# Patient Record
Sex: Female | Born: 1950
Health system: Southern US, Community
[De-identification: ages and names within clinical notes are randomized; demographics above are authoritative.]

## PROBLEM LIST (undated history)

## (undated) DIAGNOSIS — G709 Myoneural disorder, unspecified: Secondary | ICD-10-CM

## (undated) DIAGNOSIS — T4145XA Adverse effect of unspecified anesthetic, initial encounter: Secondary | ICD-10-CM

## (undated) DIAGNOSIS — E782 Mixed hyperlipidemia: Secondary | ICD-10-CM

## (undated) DIAGNOSIS — G4730 Sleep apnea, unspecified: Secondary | ICD-10-CM

## (undated) DIAGNOSIS — M810 Age-related osteoporosis without current pathological fracture: Secondary | ICD-10-CM

## (undated) DIAGNOSIS — T8859XA Other complications of anesthesia, initial encounter: Secondary | ICD-10-CM

## (undated) DIAGNOSIS — D649 Anemia, unspecified: Secondary | ICD-10-CM

## (undated) DIAGNOSIS — R05 Cough: Secondary | ICD-10-CM

## (undated) DIAGNOSIS — R351 Nocturia: Secondary | ICD-10-CM

## (undated) DIAGNOSIS — K219 Gastro-esophageal reflux disease without esophagitis: Secondary | ICD-10-CM

## (undated) DIAGNOSIS — R350 Frequency of micturition: Secondary | ICD-10-CM

## (undated) DIAGNOSIS — J309 Allergic rhinitis, unspecified: Secondary | ICD-10-CM

## (undated) DIAGNOSIS — Z87898 Personal history of other specified conditions: Secondary | ICD-10-CM

## (undated) DIAGNOSIS — Z9889 Other specified postprocedural states: Secondary | ICD-10-CM

## (undated) DIAGNOSIS — R059 Cough, unspecified: Secondary | ICD-10-CM

## (undated) DIAGNOSIS — R51 Headache: Secondary | ICD-10-CM

## (undated) DIAGNOSIS — Z8719 Personal history of other diseases of the digestive system: Secondary | ICD-10-CM

## (undated) DIAGNOSIS — N393 Stress incontinence (female) (male): Secondary | ICD-10-CM

## (undated) DIAGNOSIS — M8580 Other specified disorders of bone density and structure, unspecified site: Secondary | ICD-10-CM

## (undated) DIAGNOSIS — R3915 Urgency of urination: Secondary | ICD-10-CM

## (undated) DIAGNOSIS — M1990 Unspecified osteoarthritis, unspecified site: Secondary | ICD-10-CM

## (undated) DIAGNOSIS — R112 Nausea with vomiting, unspecified: Secondary | ICD-10-CM

## (undated) DIAGNOSIS — T7840XA Allergy, unspecified, initial encounter: Secondary | ICD-10-CM

## (undated) DIAGNOSIS — Z8669 Personal history of other diseases of the nervous system and sense organs: Secondary | ICD-10-CM

## (undated) DIAGNOSIS — Z9989 Dependence on other enabling machines and devices: Secondary | ICD-10-CM

## (undated) DIAGNOSIS — G4733 Obstructive sleep apnea (adult) (pediatric): Secondary | ICD-10-CM

## (undated) DIAGNOSIS — H269 Unspecified cataract: Secondary | ICD-10-CM

## (undated) DIAGNOSIS — J00 Acute nasopharyngitis [common cold]: Secondary | ICD-10-CM

## (undated) DIAGNOSIS — R0602 Shortness of breath: Secondary | ICD-10-CM

## (undated) HISTORY — DX: Unspecified cataract: H26.9

## (undated) HISTORY — DX: Mixed hyperlipidemia: E78.2

## (undated) HISTORY — DX: Allergic rhinitis, unspecified: J30.9

## (undated) HISTORY — DX: Personal history of other specified conditions: Z87.898

## (undated) HISTORY — PX: COLONOSCOPY: SHX174

## (undated) HISTORY — DX: Other specified disorders of bone density and structure, unspecified site: M85.80

## (undated) HISTORY — DX: Anemia, unspecified: D64.9

## (undated) HISTORY — DX: Personal history of other diseases of the nervous system and sense organs: Z86.69

## (undated) HISTORY — DX: Sleep apnea, unspecified: G47.30

## (undated) HISTORY — DX: Myoneural disorder, unspecified: G70.9

## (undated) HISTORY — DX: Age-related osteoporosis without current pathological fracture: M81.0

## (undated) HISTORY — DX: Allergy, unspecified, initial encounter: T78.40XA

---

## 1986-01-22 HISTORY — PX: VAGINAL HYSTERECTOMY: SUR661

## 1993-01-22 HISTORY — PX: SALPINGOOPHORECTOMY: SHX82

## 1999-01-11 ENCOUNTER — Encounter: Payer: Self-pay | Admitting: Obstetrics and Gynecology

## 1999-01-11 ENCOUNTER — Encounter: Admission: RE | Admit: 1999-01-11 | Discharge: 1999-01-11 | Payer: Self-pay | Admitting: Obstetrics and Gynecology

## 1999-01-18 ENCOUNTER — Other Ambulatory Visit: Admission: RE | Admit: 1999-01-18 | Discharge: 1999-01-18 | Payer: Self-pay | Admitting: Obstetrics and Gynecology

## 1999-06-29 ENCOUNTER — Emergency Department (HOSPITAL_COMMUNITY): Admission: EM | Admit: 1999-06-29 | Discharge: 1999-06-29 | Payer: Self-pay | Admitting: Emergency Medicine

## 2000-01-12 ENCOUNTER — Encounter: Admission: RE | Admit: 2000-01-12 | Discharge: 2000-01-12 | Payer: Self-pay | Admitting: Obstetrics and Gynecology

## 2000-01-12 ENCOUNTER — Encounter: Payer: Self-pay | Admitting: Obstetrics and Gynecology

## 2000-01-22 ENCOUNTER — Other Ambulatory Visit: Admission: RE | Admit: 2000-01-22 | Discharge: 2000-01-22 | Payer: Self-pay | Admitting: Obstetrics and Gynecology

## 2001-01-13 ENCOUNTER — Encounter: Payer: Self-pay | Admitting: Obstetrics and Gynecology

## 2001-01-13 ENCOUNTER — Encounter: Admission: RE | Admit: 2001-01-13 | Discharge: 2001-01-13 | Payer: Self-pay | Admitting: Obstetrics and Gynecology

## 2001-02-18 ENCOUNTER — Other Ambulatory Visit: Admission: RE | Admit: 2001-02-18 | Discharge: 2001-02-18 | Payer: Self-pay | Admitting: Obstetrics and Gynecology

## 2002-02-06 ENCOUNTER — Encounter: Admission: RE | Admit: 2002-02-06 | Discharge: 2002-02-06 | Payer: Self-pay | Admitting: Obstetrics and Gynecology

## 2002-02-06 ENCOUNTER — Encounter: Payer: Self-pay | Admitting: Obstetrics and Gynecology

## 2003-01-25 ENCOUNTER — Encounter: Payer: Self-pay | Admitting: Internal Medicine

## 2003-01-25 LAB — CONVERTED CEMR LAB

## 2003-06-10 ENCOUNTER — Encounter: Admission: RE | Admit: 2003-06-10 | Discharge: 2003-06-10 | Payer: Self-pay | Admitting: Internal Medicine

## 2004-05-11 ENCOUNTER — Ambulatory Visit: Payer: Self-pay | Admitting: Endocrinology

## 2004-06-28 ENCOUNTER — Ambulatory Visit: Payer: Self-pay | Admitting: Internal Medicine

## 2004-07-03 ENCOUNTER — Ambulatory Visit: Payer: Self-pay | Admitting: Internal Medicine

## 2004-07-21 ENCOUNTER — Encounter: Admission: RE | Admit: 2004-07-21 | Discharge: 2004-07-21 | Payer: Self-pay | Admitting: Internal Medicine

## 2005-03-09 ENCOUNTER — Ambulatory Visit: Payer: Self-pay | Admitting: Internal Medicine

## 2005-10-12 ENCOUNTER — Ambulatory Visit: Payer: Self-pay | Admitting: Internal Medicine

## 2005-10-16 ENCOUNTER — Ambulatory Visit: Payer: Self-pay | Admitting: Internal Medicine

## 2005-10-31 ENCOUNTER — Encounter: Admission: RE | Admit: 2005-10-31 | Discharge: 2005-10-31 | Payer: Self-pay | Admitting: Internal Medicine

## 2006-12-03 ENCOUNTER — Encounter: Payer: Self-pay | Admitting: Internal Medicine

## 2006-12-03 DIAGNOSIS — Z87898 Personal history of other specified conditions: Secondary | ICD-10-CM

## 2006-12-03 DIAGNOSIS — E785 Hyperlipidemia, unspecified: Secondary | ICD-10-CM | POA: Insufficient documentation

## 2006-12-03 DIAGNOSIS — E782 Mixed hyperlipidemia: Secondary | ICD-10-CM

## 2006-12-03 DIAGNOSIS — Z8669 Personal history of other diseases of the nervous system and sense organs: Secondary | ICD-10-CM

## 2006-12-03 HISTORY — DX: Personal history of other specified conditions: Z87.898

## 2006-12-03 HISTORY — DX: Mixed hyperlipidemia: E78.2

## 2006-12-03 HISTORY — DX: Personal history of other diseases of the nervous system and sense organs: Z86.69

## 2007-01-30 ENCOUNTER — Ambulatory Visit: Payer: Self-pay | Admitting: Internal Medicine

## 2007-01-30 LAB — CONVERTED CEMR LAB
ALT: 36 units/L — ABNORMAL HIGH (ref 0–35)
Albumin: 4 g/dL (ref 3.5–5.2)
Alkaline Phosphatase: 118 units/L — ABNORMAL HIGH (ref 39–117)
BUN: 13 mg/dL (ref 6–23)
Bilirubin, Direct: 0.1 mg/dL (ref 0.0–0.3)
CO2: 28 meq/L (ref 19–32)
Chloride: 107 meq/L (ref 96–112)
Creatinine, Ser: 1 mg/dL (ref 0.4–1.2)
Direct LDL: 139.3 mg/dL
Eosinophils Relative: 8.9 % — ABNORMAL HIGH (ref 0.0–5.0)
GFR calc Af Amer: 73 mL/min
GFR calc non Af Amer: 61 mL/min
HCT: 38.7 % (ref 36.0–46.0)
Hemoglobin: 13 g/dL (ref 12.0–15.0)
LDL Cholesterol: 138 mg/dL — ABNORMAL HIGH (ref 0–99)
MCV: 84.9 fL (ref 78.0–100.0)
Monocytes Absolute: 0.5 10*3/uL (ref 0.2–0.7)
Platelets: 442 10*3/uL — ABNORMAL HIGH (ref 150–400)
Potassium: 3.9 meq/L (ref 3.5–5.1)
RBC: 4.56 M/uL (ref 3.87–5.11)
Sodium: 142 meq/L (ref 135–145)
Total Protein: 7.5 g/dL (ref 6.0–8.3)
VLDL: 23 mg/dL (ref 0–40)

## 2007-02-02 ENCOUNTER — Encounter: Payer: Self-pay | Admitting: Internal Medicine

## 2007-03-14 ENCOUNTER — Encounter: Admission: RE | Admit: 2007-03-14 | Discharge: 2007-03-14 | Payer: Self-pay | Admitting: Internal Medicine

## 2007-05-20 ENCOUNTER — Ambulatory Visit: Payer: Self-pay | Admitting: Internal Medicine

## 2007-05-20 DIAGNOSIS — J069 Acute upper respiratory infection, unspecified: Secondary | ICD-10-CM | POA: Insufficient documentation

## 2008-01-12 ENCOUNTER — Telehealth: Payer: Self-pay | Admitting: Internal Medicine

## 2008-01-12 ENCOUNTER — Ambulatory Visit: Payer: Self-pay | Admitting: Internal Medicine

## 2008-06-22 ENCOUNTER — Encounter: Admission: RE | Admit: 2008-06-22 | Discharge: 2008-06-22 | Payer: Self-pay | Admitting: Internal Medicine

## 2009-03-31 ENCOUNTER — Ambulatory Visit: Payer: Self-pay | Admitting: Internal Medicine

## 2009-04-18 ENCOUNTER — Ambulatory Visit: Payer: Self-pay | Admitting: Internal Medicine

## 2009-12-22 ENCOUNTER — Encounter: Admission: RE | Admit: 2009-12-22 | Discharge: 2009-12-22 | Payer: Self-pay | Admitting: Internal Medicine

## 2010-02-23 NOTE — Assessment & Plan Note (Signed)
Summary: 2 wk f/u from seeing Christina Hayes(men was out)/cd   Vital Signs:  Patient profile:   60 year old female Height:      62 inches Weight:      173 pounds BMI:     31.76 O2 Sat:      97 % on Room air Temp:     98.4 degrees F oral Pulse rate:   72 / minute BP sitting:   118 / 74  (left arm) Cuff size:   regular  Vitals Entered By: Bill Salinas CMA (April 18, 2009 1:49 PM)  O2 Flow:  Room air CC: pt here for follow up after seeing Dr Yetta Barre last week, with cough, and cold symptoms, pt states she was given a z-pak which seemed to help but pt still has symptoms/ ab   Primary Care Provider:  Loanne Emery  CC:  pt here for follow up after seeing Dr Yetta Barre last week, with cough, and cold symptoms, and pt states she was given a z-pak which seemed to help but pt still has symptoms/ ab.  History of Present Illness: Seen march 10th by Dr. Yetta Barre - office note reviewed - diagnosed with acute bronchitis, treated with azithromycin 500mg  once daily x 3 and tussionex q 12. It did make her drowsy during the day. She continues to cough and she has had some itchy eyes and sneezing. She has a recurrent cough that is non-productive. She has had more trouble with seasonal allergies  over the past several years. No shortness of breath.   Current Medications (verified): 1)  Simvastatin 40 Mg  Tabs (Simvastatin) .... Once Daily 2)  Topamax 100 Mg  Tabs (Topiramate) .... Take 1  1/2 Tablet By Mouth Once A Day 3)  Meclizine Hcl 25 Mg  Tabs (Meclizine Hcl) .... Take 1 Tablet By Mouth Three Times A Day As Needed 4)  Melatonin 300 Mcg Tabs (Melatonin) .... Take 2 Tablet By Mouth Once A Day 5)  Prevacid 15 Mg Cpdr (Lansoprazole) .... As Needed 6)  Methocarbamol 500 Mg Tabs (Methocarbamol) 7)  Diclofenac Sodium  Powd (Diclofenac Sodium) 8)  Tussionex Pennkinetic Er 8-10 Mg/107ml Lqcr (Chlorpheniramine-Hydrocodone) .... 5 Ml By Mouth Two Times A Day As Needed For Cough  Allergies (verified): No Known Drug Allergies  Past  History:  Past Medical History: Last updated: 01/12/2008 VERTIGO, HX OF (ICD-V12.49) MIGRAINES, HX OF (ICD-V13.8) HYPERLIPIDEMIA, MIXED (ICD-272.2)      Physician Roster                   Headache -   Dr. Lorelee Market - none  Past Surgical History: Last updated: 01/30/2007 Hysterectomy BSO  G2P1  Family History: Last updated: 01/30/2007 father - '29; alzheimers, DM, lipids mother- '32; good health, colon cancer survivor, DM Neg- CAD,  MGM Breast cancer  Social History: Last updated: 03/31/2009 HSG married 22 -divorced, remarried '08 1 daughter'79 work: retired from Production designer, theatre/television/film for [employer] . Never Smoked Alcohol use-no Drug use-no Regular exercise-no  Risk Factors: Alcohol Use: <1 (03/31/2009) >5 drinks/d w/in last 3 months: no (03/31/2009) Exercise: no (03/31/2009)  Risk Factors: Smoking Status: never (03/31/2009)  Review of Systems  The patient denies anorexia, fever, weight loss, weight gain, decreased hearing, chest pain, syncope, dyspnea on exertion, headaches, abdominal pain, hematochezia, hematuria, muscle weakness, difficulty walking, unusual weight change, and angioedema.    Physical Exam  General:  alert, well-developed,  well-nourished, and well-hydrated.   Head:  normocephalic and atraumatic.   Eyes:  vision grossly intact, pupils equal, and pupils round.   Neck:  supple, full ROM, and no thyromegaly.   Lungs:  normal respiratory effort, normal breath sounds, no crackles, and no wheezes.   Heart:  normal rate, regular rhythm, no JVD, and no HJR.   Skin:  turgor normal, color normal, and no rashes.   Cervical Nodes:  no anterior cervical adenopathy and no posterior cervical adenopathy.   Psych:  Oriented X3, memory intact for recent and remote, normally interactive, and good eye contact.     Impression & Recommendations:  Problem # 1:  COUGH (ICD-786.2) No evidence of infection. Most likely cyclical cough.  Plan -  prednisone 20mg  once daily x 7, prom/cod 1 tsp q 6, tessalon perles three times a day.  Complete Medication List: 1)  Simvastatin 40 Mg Tabs (Simvastatin) .... Once daily 2)  Topamax 100 Mg Tabs (Topiramate) .... Take 1  1/2 tablet by mouth once a day 3)  Meclizine Hcl 25 Mg Tabs (Meclizine hcl) .... Take 1 tablet by mouth three times a day as needed 4)  Melatonin 300 Mcg Tabs (Melatonin) .... Take 2 tablet by mouth once a day 5)  Prevacid 15 Mg Cpdr (Lansoprazole) .... As needed 6)  Methocarbamol 500 Mg Tabs (Methocarbamol) 7)  Diclofenac Sodium Powd (Diclofenac sodium) 8)  Promethazine-codeine 6.25-10 Mg/40ml Syrp (Promethazine-codeine) .Marland Kitchen.. 1 tsp q 6 as needed for cough 9)  Prednisone 20 Mg Tabs (Prednisone) .Marland Kitchen.. 1 by mouth once daily 10)  Benzonatate 100 Mg Caps (Benzonatate) .Marland Kitchen.. 1 by mouth three times a day  Patient Instructions: 1)  cough - sounds like a cyclical cough: cough-irritation of trachea-cough-irritation of trachea, etc. Plan - prednisone 20mg  once daily x 7, tessalon perles 100mg  three times a day x 7 days, promethazine with codeine 1 tsp every 6 hours as needed. 2)  Allergic rhinnitis - fine to take loratadine or other otc non-sedating antihistamine 3)  Shingles vaccine - ok for today.  Prescriptions: BENZONATATE 100 MG CAPS (BENZONATATE) 1 by mouth three times a day  #30 x 1   Entered and Authorized by:   Jacques Navy MD   Signed by:   Jacques Navy MD on 04/18/2009   Method used:   Print then Give to Patient   RxID:   [id number] PREDNISONE 20 MG TABS (PREDNISONE) 1 by mouth once daily  #7 x 0   Entered and Authorized by:   Jacques Navy MD   Signed by:   Jacques Navy MD on 04/18/2009   Method used:   Print then Give to Patient   RxID:   [id number] PROMETHAZINE-CODEINE 6.25-10 MG/5ML SYRP (PROMETHAZINE-CODEINE) 1 tsp q 6 as needed for cough  #8 oz x 1   Entered and Authorized by:   Jacques Navy MD   Signed by:   Jacques Navy MD on  04/18/2009   Method used:   Print then Give to Patient   RxID:   [id number]    Preventive Care Screening  Mammogram:    Date:  06/22/2008    Results:  normal     Not Administered:    Influenza Vaccine not given due to: declined

## 2010-02-23 NOTE — Assessment & Plan Note (Signed)
Summary: COUGH,COLD/MEN PT/NO FEVER/CD   Vital Signs:  Patient profile:   60 year old female Height:      62 inches Weight:      175 pounds BMI:     32.12 O2 Sat:      98 % on Room air Temp:     98.0 degrees F oral Pulse rate:   69 / minute Pulse rhythm:   regular Resp:     16 per minute BP sitting:   118 / 70  (left arm) Cuff size:   large  Vitals Entered By: Rock Nephew CMA (March 31, 2009 2:34 PM)  Nutrition Counseling: Patient's BMI is greater than 25 and therefore counseled on weight management options.  O2 Flow:  Room air CC: cough, congestion x 1wk, URI symptoms Is Patient Diabetic? No Pain Assessment Patient in pain? no        Primary Care Provider:  Norins  CC:  cough, congestion x 1wk, and URI symptoms.  History of Present Illness:  URI Symptoms      This is a 60 year old woman who presents with URI symptoms.  The symptoms began 1 week ago.  The severity is described as mild.  The patient reports nasal congestion, purulent nasal discharge, and dry cough, but denies sore throat, productive cough, earache, and sick contacts.  The patient denies fever, stiff neck, dyspnea, wheezing, rash, vomiting, diarrhea, use of an antipyretic, and response to antipyretic.  The patient denies sneezing, seasonal symptoms, headache, muscle aches, and severe fatigue.  Risk factors for Strep sinusitis include double sickening.  The patient denies the following risk factors for Strep sinusitis: unilateral facial pain, unilateral nasal discharge, poor response to decongestant, tooth pain, Strep exposure, tender adenopathy, and absence of cough.    Preventive Screening-Counseling & Management  Alcohol-Tobacco     Alcohol drinks/day: <1     Alcohol type: wine     >5/day in last 3 mos: no     Alcohol Counseling: not indicated; use of alcohol is not excessive or problematic     Feels need to cut down: no     Feels annoyed by complaints: no     Feels guilty re: drinking: no  Needs 'eye opener' in am: no     Smoking Status: never  Caffeine-Diet-Exercise     Does Patient Exercise: no  Hep-HIV-STD-Contraception     Hepatitis Risk: no risk noted     HIV Risk: no risk noted     STD Risk: no risk noted      Sexual History:  currently monogamous.        Drug Use:  no.        Blood Transfusions:  no.    Medications Prior to Update: 1)  Simvastatin 40 Mg  Tabs (Simvastatin) .... Once Daily 2)  Estradiol 0.05 Mg/24hr Ptwk (Estradiol) .... Apply 1 Q Wk 3)  Topamax 100 Mg  Tabs (Topiramate) .... Take 1  1/2 Tablet By Mouth Once A Day 4)  Flexeril 10 Mg  Tabs (Cyclobenzaprine Hcl) .... As Needed 5)  Meclizine Hcl 25 Mg  Tabs (Meclizine Hcl) .... Take 1 Tablet By Mouth Three Times A Day As Needed 6)  Atenolol 25 Mg Tabs (Atenolol) .... Take 1 Tablet By Mouth Once A Day 7)  Famotidine 20 Mg Tabs (Famotidine) .... Take 2 Tablet By Mouth Once A Day 8)  Melatonin 300 Mcg Tabs (Melatonin) .... Take 2 Tablet By Mouth Once A Day  Current Medications (verified): 1)  Simvastatin 40 Mg  Tabs (Simvastatin) .... Once Daily 2)  Topamax 100 Mg  Tabs (Topiramate) .... Take 1  1/2 Tablet By Mouth Once A Day 3)  Meclizine Hcl 25 Mg  Tabs (Meclizine Hcl) .... Take 1 Tablet By Mouth Three Times A Day As Needed 4)  Melatonin 300 Mcg Tabs (Melatonin) .... Take 2 Tablet By Mouth Once A Day 5)  Prevacid 15 Mg Cpdr (Lansoprazole) .... As Needed 6)  Methocarbamol 500 Mg Tabs (Methocarbamol) 7)  Diclofenac Sodium  Powd (Diclofenac Sodium) 8)  Zithromax Tri-Pak 500 Mg Tab (Azithromycin) .... Take As Directed One By Mouth Once Daily For 3 Days 9)  Tussionex Pennkinetic Er 8-10 Mg/75ml Lqcr (Chlorpheniramine-Hydrocodone) .... 5 Ml By Mouth Two Times A Day As Needed For Cough  Allergies (verified): No Known Drug Allergies  Past History:  Past Medical History: Reviewed history from 01/12/2008 and no changes required. VERTIGO, HX OF (ICD-V12.49) MIGRAINES, HX OF  (ICD-V13.8) HYPERLIPIDEMIA, MIXED (ICD-272.2)      Physician Roster                   Headache -   Dr. Lorelee Market - none  Past Surgical History: Reviewed history from 01/30/2007 and no changes required. Hysterectomy BSO  G2P1  Family History: Reviewed history from 01/30/2007 and no changes required. father - '29; alzheimers, DM, lipids mother- '32; good health, colon cancer survivor, DM Neg- CAD,  MGM Breast cancer  Social History: Reviewed history from 01/30/2007 and no changes required. 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  The patient denies anorexia, fever, weight loss, decreased hearing, hoarseness, chest pain, syncope, dyspnea on exertion, peripheral edema, headaches, hemoptysis, abdominal pain, suspicious skin lesions, abnormal bleeding, and enlarged lymph nodes.    Physical Exam  General:  Well-developed,well-nourished,in no acute distress; alert,appropriate and cooperative throughout examination Head:  Normocephalic and atraumatic without obvious abnormalities. No apparent alopecia or balding. Ears:  External ear exam shows no significant lesions or deformities.  Otoscopic examination reveals clear canals, tympanic membranes are intact bilaterally without bulging, retraction, inflammation or discharge. Hearing is grossly normal bilaterally. Nose:  External nasal examination shows no deformity or inflammation. Nasal mucosa are pink and moist without lesions or exudates. Mouth:  Oral mucosa and oropharynx without lesions or exudates.  Teeth in good repair. Neck:  No deformities, masses, or tenderness  noted. Lungs:  Normal respiratory effort, chest expands symmetrically. Lungs are clear to auscultation, no crackles or wheezes. Heart:  Normal rate and regular rhythm. S1 and S2 normal without gallop, murmur, click, rub or other extra sounds. Abdomen:  Bowel sounds positive,abdomen soft and non-tender without masses, organomegaly or hernias noted. Msk:  No deformity or scoliosis noted of thoracic or lumbar spine.   Pulses:  R and L carotid,radial,femoral,dorsalis pedis and posterior tibial pulses are full and equal bilaterally Extremities:  No clubbing, cyanosis, edema, or deformity noted with normal full range of motion of all joints.   Neurologic:  No cranial nerve deficits noted. Station and gait are normal. Plantar  reflexes are down-going bilaterally. DTRs are symmetrical throughout. Sensory, motor and coordinative functions appear intact. Skin:  Intact without suspicious lesions or rashes Cervical Nodes:  no anterior cervical adenopathy and no posterior cervical adenopathy.   Axillary Nodes:  no R axillary adenopathy and no L axillary adenopathy.   Psych:  Cognition and judgment appear intact. Alert and cooperative with normal attention span and concentration. No apparent delusions, illusions, hallucinations   Impression & Recommendations:  Problem # 1:  BRONCHITIS-ACUTE (ICD-466.0) Assessment New  Her updated medication list for this problem includes:    Zithromax Tri-pak 500 Mg Tab (Azithromycin) .Marland Kitchen... Take as directed one by mouth once daily for 3 days    Tussionex Pennkinetic Er 8-10 Mg/78ml Lqcr (Chlorpheniramine-hydrocodone) .Marland KitchenMarland KitchenMarland KitchenMarland Kitchen 5 ml by mouth two times a day as needed for cough  Take antibiotics and other medications as directed. Encouraged to push clear liquids, get enough rest, and take acetaminophen as needed. To be seen in 5-7 days if no improvement, sooner if worse.  Problem # 2:  COUGH (ICD-786.2) Assessment: New will look for pna, edema, mass Orders: T-2 View CXR  (71020TC)  Complete Medication List: 1)  Simvastatin 40 Mg Tabs (Simvastatin) .... Once daily 2)  Topamax 100 Mg Tabs (Topiramate) .... Take 1  1/2 tablet by mouth once a day 3)  Meclizine Hcl 25 Mg Tabs (Meclizine hcl) .... Take 1 tablet by mouth three times a day as needed 4)  Melatonin 300 Mcg Tabs (Melatonin) .... Take 2 tablet by mouth once a day 5)  Prevacid 15 Mg Cpdr (Lansoprazole) .... As needed 6)  Methocarbamol 500 Mg Tabs (Methocarbamol) 7)  Diclofenac Sodium Powd (Diclofenac sodium) 8)  Zithromax Tri-pak 500 Mg Tab (Azithromycin) .... Take as directed one by mouth once daily for 3 days 9)  Tussionex Pennkinetic Er 8-10 Mg/49ml Lqcr (Chlorpheniramine-hydrocodone) .... 5 ml by mouth two times a day as needed for cough  Patient Instructions: 1)  Please schedule a follow-up appointment in 2 weeks. 2)  Take your antibiotic as prescribed until ALL of it is gone, but stop if you develop a rash or swelling and contact our office as soon as possible. 3)  Acute bronchitis symptoms for less than 10 days are not helped by antibiotics. take over the counter cough medications. call if no improvment in  5-7 days, sooner if increasing cough, fever, or new symptoms( shortness of breath, chest pain). Prescriptions: TUSSIONEX PENNKINETIC ER 8-10 MG/5ML LQCR (CHLORPHENIRAMINE-HYDROCODONE) 5 ml by mouth two times a day as needed for cough  #4 ounces x 0   Entered and Authorized by:   Etta Grandchild MD   Signed by:   Etta Grandchild MD on 03/31/2009   Method used:   Print then Give to Patient   RxID:   [id number] ZITHROMAX TRI-PAK 500 MG TAB (AZITHROMYCIN) Take as directed one by mouth once daily for 3 days  #3 x 0   Entered and Authorized by:   Etta Grandchild MD   Signed by:   Etta Grandchild MD on 03/31/2009   Method used:   Print then Give to Patient   RxID:   [id number]

## 2010-03-28 ENCOUNTER — Ambulatory Visit: Payer: BC Managed Care – PPO | Admitting: Internal Medicine

## 2010-03-28 ENCOUNTER — Ambulatory Visit (INDEPENDENT_AMBULATORY_CARE_PROVIDER_SITE_OTHER): Payer: BC Managed Care – PPO | Admitting: Internal Medicine

## 2010-03-28 ENCOUNTER — Other Ambulatory Visit: Payer: Self-pay | Admitting: Internal Medicine

## 2010-03-28 ENCOUNTER — Encounter: Payer: Self-pay | Admitting: Internal Medicine

## 2010-03-28 ENCOUNTER — Other Ambulatory Visit: Payer: BC Managed Care – PPO

## 2010-03-28 DIAGNOSIS — Z0289 Encounter for other administrative examinations: Secondary | ICD-10-CM

## 2010-03-28 DIAGNOSIS — E782 Mixed hyperlipidemia: Secondary | ICD-10-CM

## 2010-03-28 DIAGNOSIS — E785 Hyperlipidemia, unspecified: Secondary | ICD-10-CM

## 2010-03-28 DIAGNOSIS — Z0000 Encounter for general adult medical examination without abnormal findings: Secondary | ICD-10-CM

## 2010-03-28 DIAGNOSIS — J069 Acute upper respiratory infection, unspecified: Secondary | ICD-10-CM

## 2010-03-28 DIAGNOSIS — J309 Allergic rhinitis, unspecified: Secondary | ICD-10-CM | POA: Insufficient documentation

## 2010-03-28 LAB — BASIC METABOLIC PANEL
BUN: 18 mg/dL (ref 6–23)
CO2: 26 mEq/L (ref 19–32)
Calcium: 9.1 mg/dL (ref 8.4–10.5)
Chloride: 107 mEq/L (ref 96–112)
Creatinine, Ser: 0.9 mg/dL (ref 0.4–1.2)
Glucose, Bld: 105 mg/dL — ABNORMAL HIGH (ref 70–99)
Potassium: 3.6 mEq/L (ref 3.5–5.1)
Sodium: 140 mEq/L (ref 135–145)

## 2010-03-28 LAB — CBC WITH DIFFERENTIAL/PLATELET
Eosinophils Absolute: 0.5 10*3/uL (ref 0.0–0.7)
Lymphocytes Relative: 23.8 % (ref 12.0–46.0)
Lymphs Abs: 2.4 10*3/uL (ref 0.7–4.0)
MCHC: 34.1 g/dL (ref 30.0–36.0)
Monocytes Absolute: 0.8 10*3/uL (ref 0.1–1.0)
Neutro Abs: 6.3 10*3/uL (ref 1.4–7.7)
Neutrophils Relative %: 62.8 % (ref 43.0–77.0)
Platelets: 393 10*3/uL (ref 150.0–400.0)
RBC: 4.53 Mil/uL (ref 3.87–5.11)

## 2010-03-28 LAB — HEPATIC FUNCTION PANEL
ALT: 63 U/L — ABNORMAL HIGH (ref 0–35)
AST: 53 U/L — ABNORMAL HIGH (ref 0–37)
Albumin: 3.8 g/dL (ref 3.5–5.2)
Alkaline Phosphatase: 149 U/L — ABNORMAL HIGH (ref 39–117)
Total Protein: 7.3 g/dL (ref 6.0–8.3)

## 2010-03-30 ENCOUNTER — Encounter: Payer: Self-pay | Admitting: Internal Medicine

## 2010-04-04 ENCOUNTER — Ambulatory Visit (INDEPENDENT_AMBULATORY_CARE_PROVIDER_SITE_OTHER): Payer: BC Managed Care – PPO | Admitting: Internal Medicine

## 2010-04-04 ENCOUNTER — Encounter: Payer: Self-pay | Admitting: Internal Medicine

## 2010-04-04 ENCOUNTER — Other Ambulatory Visit: Payer: Self-pay | Admitting: Internal Medicine

## 2010-04-04 ENCOUNTER — Ambulatory Visit (INDEPENDENT_AMBULATORY_CARE_PROVIDER_SITE_OTHER)
Admission: RE | Admit: 2010-04-04 | Discharge: 2010-04-04 | Disposition: A | Discharge status: Home / Self Care | Payer: BC Managed Care – PPO | Source: Ambulatory Visit | Attending: Internal Medicine | Admitting: Internal Medicine

## 2010-04-04 DIAGNOSIS — R05 Cough: Secondary | ICD-10-CM

## 2010-04-04 DIAGNOSIS — J45909 Unspecified asthma, uncomplicated: Secondary | ICD-10-CM | POA: Insufficient documentation

## 2010-04-04 NOTE — Letter (Signed)
[facility] [address] [place], [place]  [address] Phone: [phone]      March 30, 2010   [facility] [address] [place], [place] [address]  RE:  LAB RESULTS  Dear  Ms. [facility],  The following is an interpretation of your most recent lab tests.  Please take note of any instructions provided or changes to medications that have resulted from your lab work.  ELECTROLYTES:  Good - no changes needed  KIDNEY FUNCTION TESTS:  Good - no changes needed  LIVER FUNCTION TESTS:  Good - no changes needed  Health professionals look at cholesterol as more involved than just the total cholesterol. We consider the level of LDL (bad) cholesterol, HDL (good), cholesterol, and Triglycerides (Grease) in the blood.  1. Your LDL should be under 100, and the HDL should be over 45, if you have any vascular disease such as heart attack, angina, stroke, TIA (mini stroke), claudication (pain in the legs when you walk due to poor circulation),  Abdominal Aortic Aneurysm (AAA), diabetes or prediabetes.  2. Your LDL should be under 130 if you have any two of the following:     a. Smoke or chew tobacco,     b. High blood pressure (if you are on medication or over 140/90 without medication),     c. Female gender,    d. HDL below 40,    e. A female relative (father, brother, or son), who have had any vascular event          as described in #1. above under the age of 48, or a female relative (mother,       sister, or daughter) who had an event as described above under age 27. (An HDL over 60 will subtract one risk factor from the total, so if you have two items in # 2 above, but an HDL over 60, you then fall into category # 3 below).  3. Your LDL should be under 160 if you have any one of the above.  Triglycerides should be under 200 with the ideal being under 150.  For diabetes or pre-diabetes, the ideal HgbA1C should be under 6.0%.  If you fall into any of the above categories, you  should make a follow up appointment to discuss this with your physician.  LIPID PANEL:  Abnormal - schedule a follow-up appointment Triglyceride: 114.0   Cholesterol: 322   LDL: 138   HDL: 79.50   Chol/HDL%:  4  THYROID STUDIES:  Thyroid studies normal TSH: 2.14     DIABETIC STUDIES:  Good - no changes needed Blood Glucose: 105    CBC:  Good - no changes needed  Very high cholesterol. Please schedule an appointment for a discussion of treatment.   Sincerely Yours,    Jacques Navy MD  [facility] Note: All result statuses are Final unless otherwise noted.  Tests: (1) Lipid Panel (LIPID)   Cholesterol          [H]  322 mg/dL                   2-956     ATP III Classification            Desirable:  < 200 mg/dL                    Borderline High:  200 - 239 mg/dL  High:  > = 240 mg/dL   Triglycerides             114.0 mg/dL                 0.4-540.9     Normal:  <150 mg/dL     Borderline High:  811 - 199 mg/dL   HDL                       91.47 mg/dL                 >82.95   VLDL Cholesterol          22.8 mg/dL                  6.2-13.0  CHO/HDL Ratio:  CHD Risk                             4                    Men          Women     1/2 Average Risk     3.4          3.3     Average Risk          5.0          4.4     2X Average Risk          9.6          7.1     3X Average Risk          15.0          11.0                           Tests: (2) Hepatic/Liver Function Panel (HEPATIC)   Total Bilirubin           0.4 mg/dL                   8.6-5.7   Direct Bilirubin          0.1 mg/dL                   8.4-6.9   Alkaline Phosphatase [H]  149 U/L                     39-117   AST                  [H]  53 U/L                      0-37   ALT                  [H]  63 U/L                      0-35   Total Protein             7.3 g/dL                    6.2-9.5   Albumin                   3.8 g/dL  3.5-5.2  Tests: (3) BMP (METABOL)    Sodium                    140 mEq/L                   135-145   Potassium                 3.6 mEq/L                   3.5-5.1   Chloride                  107 mEq/L                   96-112   Carbon Dioxide            26 mEq/L                    19-32   Glucose              [H]  105 mg/dL                   81-19   BUN                       18 mg/dL                    1-47   Creatinine                0.9 mg/dL                   8.2-9.5   Calcium                   9.1 mg/dL                   6.2-13.0   GFR                       64.33 mL/min                >60.00  Tests: (4) CBC Platelet w/Diff (CBCD)   White Cell Count          10.0 K/uL                   4.5-10.5   Red Cell Count            4.53 Mil/uL                 3.87-5.11   Hemoglobin                13.1 g/dL                   86.5-78.4   Hematocrit                38.3 %                      36.0-46.0   MCV                       84.6 fl                     78.0-100.0   MCHC  34.1 g/dL                   29.5-62.1   RDW                       13.8 %                      11.5-14.6   Platelet Count            393.0 K/uL                  150.0-400.0   Neutrophil %              62.8 %                      43.0-77.0   Lymphocyte %              23.8 %                      12.0-46.0   Monocyte %                7.7 %                       3.0-12.0   Eosinophils%         [H]  5.1 %                       0.0-5.0   Basophils %               0.6 %                       0.0-3.0   Neutrophill Absolute      6.3 K/uL                    1.4-7.7   Lymphocyte Absolute       2.4 K/uL                    0.7-4.0   Monocyte Absolute         0.8 K/uL                    0.1-1.0  Eosinophils, Absolute                             0.5 K/uL                    0.0-0.7   Basophils Absolute        0.1 K/uL                    0.0-0.1  Tests: (5) TSH (TSH)   FastTSH                   2.14 uIU/mL                 0.35-5.50  Tests: (6) Cholesterol  LDL - Direct (DIRLDL)  Cholesterol LDL - Direct                             231.2 mg/dL     Optimal:  <308 mg/dL     Near or Above Optimal:  100-129 mg/dL     Borderline High:  086-578 mg/dL     High:  469-629 mg/dL     Very High:  >528 mg/dL

## 2010-04-04 NOTE — Assessment & Plan Note (Signed)
Summary: RUNNY NOSE W/COUGH--STC   Vital Signs:  Patient profile:   60 year old female Height:      62 inches Weight:      172 pounds BMI:     31.57 O2 Sat:      97 % on Room air Temp:     98.1 degrees F oral Pulse rate:   89 / minute BP sitting:   104 / 60  (left arm) Cuff size:   regular  Vitals Entered By: Bill Salinas CMA (March 28, 2010 9:21 AM)  O2 Flow:  Room air CC: pt here for evaluation of coughing, runny nose, nasal drainage and sore throat/ ab Comments Pt is not taking melatonin, prevacid, promethazine, prednisone, or benzonatate/ ab   Primary Care Provider:  Martine Trageser  CC:  pt here for evaluation of coughing, runny nose, and nasal drainage and sore throat/ ab.  History of Present Illness: Christina Hayes prsents with a several week h/o sins drainage and congestion/. She has been taking claritin without relief., 3 days ago she developed a sored, 2 days ago she developed a cough which is now better but replaced by hoarsness. Chloroseptic spray did not help.  The cough is non-productive, she denies F/S/C. She has been taking benadryl at bedtime but this does make her drowsy.  Current Medications (verified): 1)  Simvastatin 40 Mg  Tabs (Simvastatin) .... Once Daily 2)  Topamax 100 Mg  Tabs (Topiramate) .... Take 1  1/2 Tablet By Mouth Once A Day 3)  Meclizine Hcl 25 Mg  Tabs (Meclizine Hcl) .... Take 1 Tablet By Mouth Three Times A Day As Needed 4)  Melatonin 300 Mcg Tabs (Melatonin) .... Take 2 Tablet By Mouth Once A Day 5)  Prevacid 15 Mg Cpdr (Lansoprazole) .... As Needed 6)  Methocarbamol 500 Mg Tabs (Methocarbamol) 7)  Diclofenac Sodium  Powd (Diclofenac Sodium) 8)  Promethazine-Codeine 6.25-10 Mg/51ml Syrp (Promethazine-Codeine) .Marland Kitchen.. 1 Tsp Q 6 As Needed For Cough 9)  Prednisone 20 Mg Tabs (Prednisone) .Marland Kitchen.. 1 By Mouth Once Daily 10)  Benzonatate 100 Mg Caps (Benzonatate) .Marland Kitchen.. 1 By Mouth Three Times A Day 11)  Zolpidem Tartrate 10 Mg Tabs (Zolpidem Tartrate) .... 1/2 At  Bedtime  Allergies (verified): No Known Drug Allergies  Past History:  Past Surgical History: Last updated: 01/30/2007 Hysterectomy BSO  G2P1  Family History: Last updated: 01/30/2007 father - '29; alzheimers, DM, lipids mother- '32; good health, colon cancer survivor, DM Neg- CAD,  MGM Breast cancer  Social History: Last updated: 03/31/2009 HSG married 23 -divorced, remarried '08 1 daughter'79 work: retired from Production designer, theatre/television/film for [employer] . Never Smoked Alcohol use-no Drug use-no Regular exercise-no  Past Medical History: ALLERGIC RHINITIS CAUSE UNSPECIFIED (ICD-477.9) URI (ICD-465.9) VERTIGO, HX OF (ICD-V12.49) MIGRAINES, HX OF (ICD-V13.8) HYPERLIPIDEMIA, MIXED (ICD-272.2)       Physician Roster                   Headache -   Dr. Lorelee Market - none  Review of Systems       The patient complains of hoarseness.  The patient denies anorexia, fever, weight loss, weight gain, decreased hearing, chest pain, dyspnea on exertion, abdominal pain, difficulty walking, abnormal bleeding, and enlarged lymph nodes.    Physical Exam  General:  WNWD white woman in no distress Head:  normocephalic and atraumatic.  No sinus tenderness Eyes:  C&S clear  without injection Ears:  External ear exam shows no significant lesions or deformities.  Otoscopic examination reveals clear canals, tympanic membranes are intact bilaterally without bulging, retraction, inflammation or discharge. Hearing is grossly normal bilaterally. Mouth:  posterior pharynx was clear Neck:  supple.   Lungs:  normal respiratory effort, normal breath sounds, no crackles, and no wheezes.     Impression & Recommendations:  Problem # 1:  URI (ICD-465.9) Probable viral URI vs exacerbation of allergic rhinnitis  Plan - symptomatic care: phen/cod 1 tsp q 6, vit C, fluids, zyrtec for drainage.           call for increased symptoms, fever, SOB.   Her updated medication list for this problem  includes:    Promethazine-codeine 6.25-10 Mg/81ml Syrp (Promethazine-codeine) .Marland Kitchen... 1 tsp q 6 as needed for cough    Benzonatate 100 Mg Caps (Benzonatate) .Marland Kitchen... 1 by mouth three times a day  Complete Medication List: 1)  Simvastatin 40 Mg Tabs (Simvastatin) .... Once daily 2)  Topamax 100 Mg Tabs (Topiramate) .... Take 1  1/2 tablet by mouth once a day 3)  Meclizine Hcl 25 Mg Tabs (Meclizine hcl) .... Take 1 tablet by mouth three times a day as needed 4)  Melatonin 300 Mcg Tabs (Melatonin) .... Take 2 tablet by mouth once a day 5)  Prevacid 15 Mg Cpdr (Lansoprazole) .... As needed 6)  Methocarbamol 500 Mg Tabs (Methocarbamol) 7)  Diclofenac Sodium Powd (Diclofenac sodium) 8)  Promethazine-codeine 6.25-10 Mg/24ml Syrp (Promethazine-codeine) .Marland Kitchen.. 1 tsp q 6 as needed for cough 9)  Prednisone 20 Mg Tabs (Prednisone) .Marland Kitchen.. 1 by mouth once daily 10)  Benzonatate 100 Mg Caps (Benzonatate) .Marland Kitchen.. 1 by mouth three times a day 11)  Zolpidem Tartrate 10 Mg Tabs (Zolpidem tartrate) .... 1/2 at bedtime  Other Orders: TLB-Lipid Panel (80061-LIPID) TLB-Hepatic/Liver Function Pnl (80076-HEPATIC) TLB-BMP (Basic Metabolic Panel-BMET) (80048-METABOL) TLB-CBC Platelet - w/Differential (85025-CBCD) TLB-TSH (Thyroid Stimulating Hormone) (84443-TSH)   Orders Added: 1)  TLB-Lipid Panel [80061-LIPID] 2)  TLB-Hepatic/Liver Function Pnl [80076-HEPATIC] 3)  TLB-BMP (Basic Metabolic Panel-BMET) [80048-METABOL] 4)  TLB-CBC Platelet - w/Differential [85025-CBCD] 5)  TLB-TSH (Thyroid Stimulating Hormone) [84443-TSH] 6)  Est. Patient Level III [09811]

## 2010-04-11 NOTE — Assessment & Plan Note (Signed)
Summary: fu on labs/lb   Vital Signs:  Patient profile:   60 year old female Height:      62 inches Weight:      173 pounds BMI:     31.76 O2 Sat:      97 % on Room air Temp:     98.5 degrees F oral Pulse rate:   87 / minute BP sitting:   100 / 60  (left arm) Cuff size:   regular  Vitals Entered By: Bill Salinas CMA (April 04, 2010 11:06 AM)  O2 Flow:  Room air CC: ov to discuss labs, pt also states symptoms of cough, congestion and loss of voice havent' gotten any better since last week/ ab   Primary Care Provider:  Evanna Hayes  CC:  ov to discuss labs, pt also states symptoms of cough, and congestion and loss of voice havent' gotten any better since last week/ ab.  History of Present Illness: Christina Hayes returns for follow-up after being seen March 6th for a presumed viral infection. She has contineu to have cough and rhinorrhea and malaise. She had a similar syndrom last year and was given a z-pak which really seemed to help. She has not had fever, SOB, productive sputum.  She is due for a vulvo-vaginal exam which will be done today for routine care.   Current Medications (verified): 1)  Simvastatin 40 Mg  Tabs (Simvastatin) .... Once Daily 2)  Topamax 100 Mg  Tabs (Topiramate) .... Take 1  1/2 Tablet By Mouth Once A Day 3)  Meclizine Hcl 25 Mg  Tabs (Meclizine Hcl) .... Take 1 Tablet By Mouth Three Times A Day As Needed 4)  Melatonin 300 Mcg Tabs (Melatonin) .... Take 2 Tablet By Mouth Once A Day 5)  Prevacid 15 Mg Cpdr (Lansoprazole) .... As Needed 6)  Methocarbamol 500 Mg Tabs (Methocarbamol) 7)  Diclofenac Sodium  Powd (Diclofenac Sodium) 8)  Promethazine-Codeine 6.25-10 Mg/10ml Syrp (Promethazine-Codeine) .Marland Kitchen.. 1 Tsp Q 6 As Needed For Cough 9)  Prednisone 20 Mg Tabs (Prednisone) .Marland Kitchen.. 1 By Mouth Once Daily 10)  Benzonatate 100 Mg Caps (Benzonatate) .Marland Kitchen.. 1 By Mouth Three Times A Day 11)  Zolpidem Tartrate 10 Mg Tabs (Zolpidem Tartrate) .... 1/2 At Bedtime  Allergies  (verified): No Known Drug Allergies PMH-FH-SH reviewed-no changes except otherwise noted  Review of Systems       The patient complains of hoarseness and prolonged cough.  The patient denies anorexia, fever, weight loss, weight gain, vision loss, decreased hearing, chest pain, syncope, dyspnea on exertion, peripheral edema, headaches, abdominal pain, severe indigestion/heartburn, muscle weakness, difficulty walking, depression, and enlarged lymph nodes.    Physical Exam  General:  alert, well-developed, well-nourished, well-hydrated, and normal appearance.   Head:  normocephalic and atraumatic.   Eyes:  vision grossly intact, pupils equal, pupils round, and corneas and lenses clear.   Mouth:  throat clear Neck:  supple.   Breasts:  No mass, nodules, thickening, tenderness, bulging, retraction, inflamation, nipple discharge or skin changes noted.   Lungs:  decreased breath sounds at the left base to percussion, egophony and petriloquy. no crackles and no wheezes.   Heart:  normal rate, regular rhythm, and no murmur.   Abdomen:  soft, non-tender, no guarding, and no rigidity.   Genitalia:  Pelvic Exam:        External: normal female genitalia without lesions or masses        Vagina: normal without lesions or masses  Cervix: normal without lesions or masses        Pap smear: not performed Msk:  normal ROM.   Neurologic:  alert & oriented X3, cranial nerves II-XII intact, and gait normal.   Skin:  turgor normal, color normal, and no ecchymoses.   Cervical Nodes:  no anterior cervical adenopathy and no posterior cervical adenopathy.   Axillary Nodes:  no R axillary adenopathy and no L axillary adenopathy.   Psych:  Oriented X3, memory intact for recent and remote, and normally interactive.     Impression & Recommendations:  Problem # 1:  COUGH (ICD-786.2) Persistent symptoms. Question of mycoplasma pneumoniae  Plan - CXR           Azithromycin tri-pak  Orders: T-2 View CXR  (71020TC)  DG CHEST 2 VIEW - [id number] Clinical Data: Cough for 1-1/2 weeks.  Nonsmoker.  CHEST - 2 VIEW  Comparison: 03/31/2009  Findings: Heart size is normal.  The lungs are free of focal consolidations and pleural effusions.  No edema. Visualized osseous structures have a normal appearance.  IMPRESSION: Negative exam.  Original Report Authenticated By: Patterson Hammersmith, M.D.  Problem # 2:  Preventive Health Care (ICD-V70.0) Normal breast exam and pelvic exam.   Complete Medication List: 1)  Simvastatin 40 Mg Tabs (Simvastatin) .... Once daily 2)  Topamax 100 Mg Tabs (Topiramate) .... Take 1  1/2 tablet by mouth once a day 3)  Meclizine Hcl 25 Mg Tabs (Meclizine hcl) .... Take 1 tablet by mouth three times a day as needed 4)  Melatonin 300 Mcg Tabs (Melatonin) .... Take 2 tablet by mouth once a day 5)  Prevacid 15 Mg Cpdr (Lansoprazole) .... As needed 6)  Methocarbamol 500 Mg Tabs (Methocarbamol) 7)  Diclofenac Sodium Powd (Diclofenac sodium) 8)  Promethazine-codeine 6.25-10 Mg/52ml Syrp (Promethazine-codeine) .Marland Kitchen.. 1 tsp q 6 as needed for cough 9)  Prednisone 20 Mg Tabs (Prednisone) .Marland Kitchen.. 1 by mouth once daily 10)  Benzonatate 100 Mg Caps (Benzonatate) .Marland Kitchen.. 1 by mouth three times a day 11)  Zolpidem Tartrate 10 Mg Tabs (Zolpidem tartrate) .... 1/2 at bedtime 12)  Zithromax Tri-pak 500 Mg Tabs (Azithromycin) .... As directed Prescriptions: ZITHROMAX TRI-PAK 500 MG TABS (AZITHROMYCIN) as directed  #1 month x 0   Entered by:   Ami Bullins CMA   Authorized by:   Jacques Navy MD   Signed by:   Bill Salinas CMA on 04/04/2010   Method used:   Electronically to        [facility] # [id number]* (retail)       [address] [place], [place]  [address]       Ph: [phone]       Fax: [phone]   RxID:   [id number] BENZONATATE 100 MG CAPS (BENZONATATE) 1 by mouth three times a day  #30 x 1   Entered by:   Bill Salinas CMA   Authorized by:   Jacques Navy MD   Signed by:   Bill Salinas CMA on 04/04/2010   Method used:   Electronically to        [facility] # [id number]* (retail)       [address] [place], [place]  [address]       Ph: [phone]       Fax: [phone]   RxID:   [id number]    Orders Added: 1)  T-2  View CXR [71020TC] 2)  Est. Patient Level II [16109] 3)  Est. Patient Level III [60454]

## 2010-04-21 ENCOUNTER — Other Ambulatory Visit: Payer: Self-pay | Admitting: Internal Medicine

## 2010-04-25 ENCOUNTER — Ambulatory Visit (INDEPENDENT_AMBULATORY_CARE_PROVIDER_SITE_OTHER): Payer: BC Managed Care – PPO | Admitting: *Deleted

## 2010-04-25 DIAGNOSIS — Z23 Encounter for immunization: Secondary | ICD-10-CM

## 2010-04-25 DIAGNOSIS — Z2911 Encounter for prophylactic immunotherapy for respiratory syncytial virus (RSV): Secondary | ICD-10-CM

## 2010-04-25 MED ORDER — SIMVASTATIN 40 MG PO TABS: 40.0000 mg | ORAL_TABLET | Freq: Every evening | ORAL | Status: DC

## 2010-06-09 NOTE — Assessment & Plan Note (Signed)
[facility]                             PRIMARY CARE OFFICE NOTE   AZLEE, MONFORTE                    MRN:          [id number]  DATE:10/16/2005                            DOB:          03/14/1950    HISTORY:  [name] is a 60 year old woman who presents for general  followup evaluation and examination.  She was last seen in the office  March 09, 2005 by Dr. Thomos Lemons for dizziness.  The patient reports that  since that time she has had four episodes of significant  disequilibrium/dizziness that has been slow to respond to meclizine,  generally lasting one to two days.  The patient also reports she has had  mild discomfort in the area of her right ear and a sense of fullness and a  question of some hearing loss.   The patient reports increased frequency of migraines.  She uses OTC  medication for this.   PAST SURGICAL HISTORY:  1. Vaginal hysterectomy in 1988.  2. Bilateral salpingo-oophorectomy in 1995.   PAST MEDICAL HISTORY:  The patient had the usual childhood diseases.  She is  fully immunized.  The patient has a remote history of vertigo in 1990.  She  has migraine headaches as noted.  Patient has hyperlipidemia.   OBSTETRICAL/GYN HISTORY:  Patient is a gravida 2, para 1 with 1 SAB.   FAMILY HISTORY:  Father had diabetes.  Mother had colon cancer with surgical  resection.  Maternal grandmother with breast cancer.   SOCIAL HISTORY:  The patient is a high Garment/textile technologist.  She was married for  23 years and then separated and divorced.  She has no history of physical or  sexual abuse.  She has a 14 year old daughter.  The patient lives in her own  apartment.  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   REVIEW OF SYSTEMS:  Negative for any constitutional, cardiovascular,  respiratory, gastrointestinal or genitourinary problems except as noted  above.   CURRENT MEDICATIONS:  1. Simvastatin 40 mg daily.  2. Vivelle patch  0.5 mg weekly.   HEALTH MAINTENANCE:  Last mammogram July 21, 2004, which was a negative  study.  Last pelvic examination was in 2000.   CHART REVIEW:  Patient had EMG study performed July 21, 2004 which revealed  moderate right carpal tunnel syndrome and mild left carpal tunnel syndrome.  Last 12 lead electrocardiogram from June of 2001 showed a normal sinus  rhythm and a normal EKG.  Last colonoscopy Jun 09, 2003 which was a negative  study with followup recommended in 2012.   PHYSICAL EXAMINATION:  VITAL SIGNS:  Temperature 98.5, blood pressure  107/70, pulse 98, weight 155.  GENERAL APPEARANCE:  Well-nourished, well-developed woman in no acute  distress.  HEENT: Normocephalic, atraumatic.  EAC's and TM's appeared normal.  Oropharynx with native dentition in good repair.  No buccal or palatal  lesions were noted  Posterior pharynx was clear.  Conjunctivae and sclerae  clear. Pupils equal, round, reactive to light and accommodation. Extraocular  movements intact..  Funduscopic examination was unremarkable.  NECK:  Supple  without thyromegaly.  NODES:  No adenopathy was noted in the cervical, supraclavicular regions.  CHEST:  No costovertebral angle tenderness.  Lungs were clear to  auscultation and percussion.  BREAST:  Skin was normal.  Nipples without discharge.  No fixed mass, lesion  or abnormalities noted.  There is no axillary adenopathy.  CARDIOVASCULAR:  2+ radial pulses, no jugular venous distention or carotid  bruits. She had a quiet precordium with regular rate and rhythm without  murmurs, rubs or gallops.  ABDOMEN:  Soft. No guarding or rebound.  No organomegaly or splenomegaly  noted.  PELVIC/RECTAL:  Deferred.  EXTREMITIES:  Without cyanosis, clubbing or edema or deformity.  NEUROLOGICAL:  Patient is awake, alert, oriented to person, place, time and  context. Speech is clear.  Cognition seems normal.  Cranial nerves II-XII  were grossly intact with patient having  normal facial symmetry and muscle  movement.  Extraocular muscles were intact. Pupils equal, round, reactive to  light and accommodation.  Funduscopic examination was unremarkable.  Hearing  was somewhat depressed to Rinne test.  Motor strength was normal.  Cerebellar function was unremarkable with no tremor.  Normal gait and  station.   LABORATORY DATA:  Hemoglobin 12.9 grams, white count 6,300 with normal  differential except for increased eosinophils at 10.3%.  Cholesterol was  191, triglycerides 69, HDL 71.4, LDL 106.  Electrolytes were normal with  glucose of 102.  Kidney function and liver functions were normal.  Thyroid  function normal with TSH at 0.94.  Urinalysis was negative.   ASSESSMENT/PLAN:  1. Dizziness.  Patient with more frequent episodes of dizziness that are      slow to respond to Meclizine.  The patient also with mild decreased      hearing to Rinne test as well as having some discomfort in her right      ear.  At this point we will need to rule out acoustic neuroma.  Patient      is to be scheduled for an MRI of the brain.  2. Allergy.  The patient does have mild eosinophilia.  She has no other      symptoms.  Suspect she may have allergic rhinitis which could be      exacerbating her headaches.  We will have the patient try over the      counter Claritin or the equivalent.  3. Migraine headaches.  The patient is having increasing frequency of her      headaches.  At some point she may need to revisit [facility] in regards to managing her discomfort.  4. Health maintenance.  The patient is status post hysterectomy.  She is      due for a mammogram and will be referred to the breast center.  Patient      is current and up-to-date with colorectal cancer screening.   In summary, this is a very pleasant woman with question of acoustic neuroma given her recurrent labyrinthitis and loss of hearing for MRI as noted.                                    Rosalyn Gess Norins, MD   MEN/MedQ  DD:  10/17/2005  DT:  10/18/2005  Job #:  [id number]   cc:   Alvie Heidelberg

## 2010-10-05 ENCOUNTER — Encounter: Payer: Self-pay | Admitting: Endocrinology

## 2010-10-05 ENCOUNTER — Ambulatory Visit (INDEPENDENT_AMBULATORY_CARE_PROVIDER_SITE_OTHER): Payer: BC Managed Care – PPO | Admitting: Endocrinology

## 2010-10-05 DIAGNOSIS — R05 Cough: Secondary | ICD-10-CM

## 2010-10-05 DIAGNOSIS — J309 Allergic rhinitis, unspecified: Secondary | ICD-10-CM

## 2010-10-05 MED ORDER — DOXYCYCLINE HYCLATE 100 MG PO TABS: 100.0000 mg | ORAL_TABLET | Freq: Two times a day (BID) | ORAL | Status: AC

## 2010-10-05 MED ORDER — PROMETHAZINE-DM 6.25-15 MG/5ML PO SYRP: 5.0000 mL | ORAL_SOLUTION | Freq: Four times a day (QID) | ORAL | Status: AC | PRN

## 2010-10-05 NOTE — Patient Instructions (Addendum)
i have sent a prescription to your pharmacy, for an antibiotic, and cough medication. here is a sample of "dulera-100."  take 1 puff 2x a day.  rinse mouth after using. Loratadine-d (non-prescription) will help your congestion. I hope you feel better soon.  If you don't feel better by next week, please call doctor norins. (update: i left message on phone-tree:  rx as we discussed)

## 2010-10-05 NOTE — Progress Notes (Signed)
  Subjective:    Patient ID: Christina Hayes, female    DOB: [birth year], 60 y.o.   MRN: [id number]  HPI 5 days of prod-quality cough, wheezing in the chest, and assoc rhinorrhea. Past Medical History  Diagnosis Date  . HYPERLIPIDEMIA, MIXED 12/03/2006  . VERTIGO, HX OF 12/03/2006  . MIGRAINES, HX OF 12/03/2006  . Allergic rhinitis, cause unspecified     Past Surgical History  Procedure Date  . Abdominal hysterectomy   . Salpingoophorectomy     History   Social History  . Marital Status: Married    Spouse Name: N/A    Number of Children: 1  . Years of Education: 12   Occupational History  . Retired    Social History Main Topics  . Smoking status: Never Smoker   . Smokeless tobacco: Not on file  . Alcohol Use: No  . Drug Use: No  . Sexually Active:    Other Topics Concern  . Not on file   Social History Narrative   HSGMarried 22-divorced, remarried '081 daughter '79Work: retired from Production designer, theatre/television/film for [employer] exercise-no    Current Outpatient Prescriptions on File Prior to Visit  Medication Sig Dispense Refill  . simvastatin (ZOCOR) 40 MG tablet Take 1 tablet (40 mg total) by mouth every evening.  90 tablet  1    Allergies  Allergen Reactions  . Codeine Itching    Family History  Problem Relation Age of Onset  . Cancer Mother     Colon Cancer survivor  . Diabetes Mother   . Diabetes Father   . Hyperlipidemia Father   . Alzheimer's disease Father   . Cancer Maternal Grandmother     Breast Cancer    BP 122/66  Pulse 84  Temp(Src) 98.2 F (36.8 C) (Oral)  Ht 5\' 2"  (1.575 m)  Wt 172 lb 3.2 oz (78.109 kg)  BMI 31.50 kg/m2  SpO2 98%    Review of Systems denies fever and earache    Objective:   Physical Exam VITAL SIGNS:  See vs page GENERAL: no distress head: no deformity eyes: no periorbital swelling, no proptosis external nose and ears are normal mouth: no lesion seen Ears: left tm is red, but right is normal. LUNGS:  Clear to  auscultation  Cxr: nad     Assessment & Plan:  Acute bronchitis, new

## 2010-10-20 ENCOUNTER — Ambulatory Visit (INDEPENDENT_AMBULATORY_CARE_PROVIDER_SITE_OTHER): Payer: BC Managed Care – PPO | Admitting: Internal Medicine

## 2010-10-20 VITALS — BP 110/70 | HR 71 | Temp 98.0°F | Wt 173.0 lb

## 2010-10-20 DIAGNOSIS — M79609 Pain in unspecified limb: Secondary | ICD-10-CM

## 2010-10-20 DIAGNOSIS — M79629 Pain in unspecified upper arm: Secondary | ICD-10-CM

## 2010-10-22 NOTE — Progress Notes (Signed)
  Subjective:    Patient ID: Christina Hayes, female    DOB: 1950/07/02, 60 y.o.   MRN: [id number]  HPI Mrs. Kolek presents due to discomfort in the axilla bilaterally. She has not had any lesion, infected hair follicles, fevers, night sweats, major changes in weight. She is current with mammography with no abnormalities. The discomfort is rated at 3/10 and is intermittent. She has not done anything physical that could cause muscle strain or sprain.  Past Medical History  Diagnosis Date  . HYPERLIPIDEMIA, MIXED 12/03/2006  . VERTIGO, HX OF 12/03/2006  . MIGRAINES, HX OF 12/03/2006  . Allergic rhinitis, cause unspecified    Past Surgical History  Procedure Date  . Abdominal hysterectomy   . Salpingoophorectomy    Family History  Problem Relation Age of Onset  . Cancer Mother     Colon Cancer survivor  . Diabetes Mother   . Diabetes Father   . Hyperlipidemia Father   . Alzheimer's disease Father   . Cancer Maternal Grandmother     Breast Cancer   History   Social History  . Marital Status: Married    Spouse Name: N/A    Number of Children: 1  . Years of Education: 12   Occupational History  . Retired    Social History Main Topics  . Smoking status: Never Smoker   . Smokeless tobacco: Not on file  . Alcohol Use: No  . Drug Use: No  . Sexually Active:    Other Topics Concern  . Not on file   Social History Narrative   HSGMarried 22-divorced, remarried '081 daughter '79Work: retired from Production designer, theatre/television/film for [employer] exercise-no      Review of Systems Constitutional:  Negative for fever, chills, activity change and unexpected weight change.  HEENT:  Negative for hearing loss, ear pain, congestion, neck stiffness and postnasal drip. Negative for sore throat or swallowing problems. Negative for dental complaints.   Eyes: Negative for vision loss or change in visual acuity.  Respiratory: Negative for chest tightness and wheezing. Negative for DOE.   Cardiovascular:  Negative for chest pain or palpitations. No decreased exercise tolerance Gastrointestinal: No change in bowel habit. No bloating or gas. No reflux or indigestion Genitourinary: Negative for urgency, frequency, flank pain and difficulty urinating.  Musculoskeletal: Negative for myalgias, back pain, arthralgias and gait problem.  Neurological: Negative for dizziness, tremors, weakness and headaches.  Hematological: Negative for adenopathy.  Psychiatric/Behavioral: Negative for behavioral problems and dysphoric mood.       Objective:   Physical Exam Vitals reviewd - BP excellent, no fever Gen'l - overweight white woman in no distress Nodes - none submandibular, cervical, supraclavicular or axillary locations. Derm - no skin lesion visible and no palpable abnormality on careful palpation of the axilla, including the lateral anterior chest wall/tail of the breast.       Assessment & Plan:  Axillary pain - negative exam, negative hx.  Plan - reassurance           Return for any change in condition.

## 2010-11-14 ENCOUNTER — Other Ambulatory Visit: Payer: Self-pay | Admitting: Internal Medicine

## 2010-11-14 ENCOUNTER — Ambulatory Visit (INDEPENDENT_AMBULATORY_CARE_PROVIDER_SITE_OTHER): Payer: BC Managed Care – PPO | Admitting: Internal Medicine

## 2010-11-14 ENCOUNTER — Other Ambulatory Visit (INDEPENDENT_AMBULATORY_CARE_PROVIDER_SITE_OTHER): Payer: BC Managed Care – PPO

## 2010-11-14 DIAGNOSIS — J329 Chronic sinusitis, unspecified: Secondary | ICD-10-CM

## 2010-11-14 DIAGNOSIS — R5381 Other malaise: Secondary | ICD-10-CM

## 2010-11-14 DIAGNOSIS — Z1231 Encounter for screening mammogram for malignant neoplasm of breast: Secondary | ICD-10-CM

## 2010-11-14 DIAGNOSIS — E782 Mixed hyperlipidemia: Secondary | ICD-10-CM

## 2010-11-14 DIAGNOSIS — R5383 Other fatigue: Secondary | ICD-10-CM

## 2010-11-14 LAB — CBC WITH DIFFERENTIAL/PLATELET
Basophils Absolute: 0 10*3/uL (ref 0.0–0.1)
Eosinophils Absolute: 0.4 10*3/uL (ref 0.0–0.7)
HCT: 37.9 % (ref 36.0–46.0)
Lymphocytes Relative: 28.5 % (ref 12.0–46.0)
Lymphs Abs: 2.1 10*3/uL (ref 0.7–4.0)
MCHC: 33.4 g/dL (ref 30.0–36.0)
MCV: 86 fl (ref 78.0–100.0)
Monocytes Absolute: 0.4 10*3/uL (ref 0.1–1.0)
Monocytes Relative: 5.6 % (ref 3.0–12.0)
Neutro Abs: 4.4 10*3/uL (ref 1.4–7.7)
Neutrophils Relative %: 59.6 % (ref 43.0–77.0)
RDW: 14.2 % (ref 11.5–14.6)
WBC: 7.4 10*3/uL (ref 4.5–10.5)

## 2010-11-14 LAB — HEPATIC FUNCTION PANEL
Bilirubin, Direct: 0 mg/dL (ref 0.0–0.3)
Total Protein: 7.8 g/dL (ref 6.0–8.3)

## 2010-11-14 LAB — COMPREHENSIVE METABOLIC PANEL
ALT: 44 U/L — ABNORMAL HIGH (ref 0–35)
AST: 32 U/L (ref 0–37)
Albumin: 4.2 g/dL (ref 3.5–5.2)
BUN: 10 mg/dL (ref 6–23)
CO2: 28 mEq/L (ref 19–32)
GFR: 75.15 mL/min (ref >60.00)
Glucose, Bld: 156 mg/dL — ABNORMAL HIGH (ref 70–99)
Potassium: 3.3 mEq/L — ABNORMAL LOW (ref 3.5–5.1)
Total Bilirubin: 0.5 mg/dL (ref 0.3–1.2)

## 2010-11-14 LAB — SEDIMENTATION RATE: Sed Rate: 39 mm/hr — ABNORMAL HIGH (ref 0–22)

## 2010-11-14 MED ORDER — HYDROCORTISONE ACETATE 25 MG RE SUPP: 25.0000 mg | Freq: Two times a day (BID) | RECTAL | Status: AC

## 2010-11-14 NOTE — Patient Instructions (Addendum)
Ear discomfort - no signs of infection, TMJ, sinus infection. Hearing is slightly impaired. May be eustachian tube dysfunction.  Plan - try sudafed 30 mg two or three times a day. If no relief the next step is to see ENT.   :Pain with bowel move,ment and blood on the tissue is very suggestive of internal hemorrhoids, especially if you don't feel any nodules or "nubbins" on the outside at the rectum. Plan - insure an easy bowel movement - do not strain at the commode; trial of hemorrhoid suppository - use after a sitz bath twice a day. If persistent pain will need to have a rectal and anus exam.    Barotitis Media Barotitis media is soreness (inflammation) of the area behind the eardrum (middle ear). This occurs when the auditory tube (Eustachian tube) leading from the back of the throat to the eardrum is blocked. When it is blocked air cannot move in and out of the middle ear to equalize pressure changes. These pressure changes come from changes in altitude when:  Flying.     Driving in the mountains.     Diving.  Problems are more likely to occur with pressure changes during times when you are congested as from:  Hay fever.     Upper respiratory infection.     A cold.  Damage or hearing loss (barotrauma) caused by this may be permanent. HOME CARE INSTRUCTIONS    Use medicines as recommended by your caregiver. Over the counter medicines will help unblock the canal and can help during times of air travel.     Do not put anything into your ears to clean or unplug them. Eardrops will not be helpful.     Do not swim, dive, or fly until your caregiver says it is all right to do so. If these activities are necessary, chewing gum with frequent swallowing may help. It is also helpful to hold your nose and gently blow to pop your ears for equalizing pressure changes. This forces air into the Eustachian tube.     For little ones with problems, give your baby a bottle of water or juice during  periods when pressure changes would be anticipated such as during take offs and landings associated with air travel.     Only take over-the-counter or prescription medicines for pain, discomfort, or fever as directed by your caregiver.     A decongestant may be helpful in de-congesting the middle ear and make pressure equalization easier. This can be even more effective if the drops (spray) are delivered with the head lying over the edge of a bed with the head tilted toward the ear on the affected side.     If your caregiver has given you a follow-up appointment, it is very important to keep that appointment. Not keeping the appointment could result in a chronic or permanent injury, pain, hearing loss and disability. If there is any problem keeping the appointment, you must call back to this facility for assistance.  SEEK IMMEDIATE MEDICAL CARE IF:    You develop a severe headache, dizziness, severe ear pain, or bloody or pus-like drainage from your ears.     An oral temperature above 102 F (38.9 C) develops.     Your problems do not improve or become worse.  MAKE SURE YOU:    Understand these instructions.     Will watch your condition.     Will get help right away if you are not doing well or get  worse.  Document Released: 01/06/2000 Document Revised: 09/20/2010 Document Reviewed: 08/14/2007 [facility] Patient Information 2012 [place], [place].   Hemorrhoids Hemorrhoids are enlarged (dilated) veins around the rectum. There are 2 types of hemorrhoids, and the type of hemorrhoid is determined by its location.  Internal hemorrhoids occur in the veins just inside the rectum. They are usually not painful, but they may bleed. However, they may poke through to the outside and become irritated and painful. External hemorrhoids involve the veins outside the anus and can be felt as a painful swelling or hard lump near the anus. They are often itchy and may crack and bleed. Sometimes clots will form  in the veins. This makes them swollen and painful. These are called thrombosed hemorrhoids. CAUSES Causes of hemorrhoids include:  Pregnancy. This increases the pressure in the hemorrhoidal veins.     Constipation.    Straining to have a bowel movement.     Obesity.    Heavy lifting or other activity that caused you to strain.  TREATMENT Most of the time hemorrhoids improve in 1 to 2 weeks. However, if symptoms do not seem to be getting better or if you have a lot of rectal bleeding, your caregiver may perform a procedure to help make the hemorrhoids get smaller or remove them completely. Possible treatments include:  Rubber band ligation. A rubber band is placed at the base of the hemorrhoid to cut off the circulation.     Sclerotherapy. A chemical is injected to shrink the hemorrhoid.     Infrared light therapy. Tools are used to burn the hemorrhoid.     Hemorrhoidectomy. This is surgical removal of the hemorrhoid.  HOME CARE INSTRUCTIONS    Increase fiber in your diet. Ask your caregiver about using fiber supplements.     Drink enough water and fluids to keep your urine clear or pale yellow.     Exercise regularly.     Go to the bathroom when you have the urge to have a bowel movement. Do not wait.     Avoid straining to have bowel movements.     Keep the anal area dry and clean.     Only take over-the-counter or prescription medicines for pain, discomfort, or fever as directed by your caregiver.  If your hemorrhoids are thrombosed:  Take warm sitz baths for 20 to 30 minutes, 3 to 4 times per day.     If the hemorrhoids are very tender and swollen, place ice packs on the area as tolerated. Using ice packs between sitz baths may be helpful. Fill a plastic bag with ice. Place a towel between the bag of ice and your skin.     Medicated creams and suppositories may be used or applied as directed.     Do not use a donut-shaped pillow or sit on the toilet for long periods.  This increases blood pooling and pain.  SEEK MEDICAL CARE IF:    You have increasing pain and swelling that is not controlled with your medicine.     You have uncontrolled bleeding.     You have difficulty or you are unable to have a bowel movement.     You have pain or inflammation outside the area of the hemorrhoids.     You have chills or an oral temperature above 102 F (38.9 C).  MAKE SURE YOU:    Understand these instructions.     Will watch your condition.     Will get help right away  if you are not doing well or get worse.  Document Released: 01/06/2000 Document Revised: 09/20/2010 Document Reviewed: 05/13/2007 [facility] Patient Information 2012 [place], [place].

## 2010-11-14 NOTE — Progress Notes (Signed)
  Subjective:    Patient ID: Christina Hayes, female    DOB: 01-12-1951, 60 y.o.   MRN: [id number]  HPI Christina Hayes presents with several weeks of ear discomfort, described as a pressure against the ear. NO frank pain, tinnitus, drainage form the ear, fever, sinus congestion. She has not flown or had any other type of barotrauma. No sore throat.  She reports swelling of the left ankle/foot, more than the right and progressive during the day.  She has had painful BMs, notices blood on the toilet tissue. No prior history of hemorrhoids. She has not felt any swelling or enlarged vessels at the rectum  PMH, FamHx and SocHx reviewed for any changes and relevance.    Review of Systems System review is negative for any constitutional, cardiac, pulmonary, GI or neuro symptoms or complaints other than as described in the HPI.     Objective:   Physical Exam Vitals - no fever, BP good Gen'l - pleasant white woman who is in no distress HEENT- [place]/AT, no sinus tenderness to percussion, throat clear Nodes - none in cervical or supraclavicular regions Chest - no deformity, no CVAT, no palpable mass in the back or sides along the axillary line, no chest wall tenderness Breast - nipples w/o discharge, skin w/o abnormal tecture or appearance, no fixed mass or lesion, axilla clear w/o enlarged lymp nodes or masses. Cor - RRR        Assessment & Plan:  Fatgiue and malaise - feels that she has something wrong, possibly a chronic illness/infection. Exam is unremarkable. Lab results are in normal range.  Plan - consultation with PT.           Full labs: CBCD, Cmet, FLTs, ESR, ANA and  CT sinus to r/o chronic sinusitis

## 2010-11-15 ENCOUNTER — Encounter: Payer: Self-pay | Admitting: Internal Medicine

## 2010-11-16 ENCOUNTER — Encounter: Payer: Self-pay | Admitting: Internal Medicine

## 2010-11-17 ENCOUNTER — Other Ambulatory Visit: Payer: BC Managed Care – PPO

## 2010-12-20 ENCOUNTER — Other Ambulatory Visit: Payer: Self-pay | Admitting: Internal Medicine

## 2010-12-25 ENCOUNTER — Ambulatory Visit
Admission: RE | Admit: 2010-12-25 | Discharge: 2010-12-25 | Disposition: A | Discharge status: Home / Self Care | Payer: BC Managed Care – PPO | Source: Ambulatory Visit | Attending: Internal Medicine | Admitting: Internal Medicine

## 2010-12-25 DIAGNOSIS — Z1231 Encounter for screening mammogram for malignant neoplasm of breast: Secondary | ICD-10-CM

## 2010-12-26 ENCOUNTER — Encounter: Payer: Self-pay | Admitting: Internal Medicine

## 2010-12-26 ENCOUNTER — Ambulatory Visit (INDEPENDENT_AMBULATORY_CARE_PROVIDER_SITE_OTHER): Payer: BC Managed Care – PPO | Admitting: Internal Medicine

## 2010-12-26 VITALS — BP 90/62 | HR 76 | Temp 98.8°F | Resp 14 | Wt 169.1 lb

## 2010-12-26 DIAGNOSIS — R059 Cough, unspecified: Secondary | ICD-10-CM

## 2010-12-26 DIAGNOSIS — R05 Cough: Secondary | ICD-10-CM

## 2010-12-26 MED ORDER — PREDNISONE 10 MG PO TABS: 10.0000 mg | ORAL_TABLET | Freq: Every day | ORAL | Status: AC

## 2010-12-26 MED ORDER — BENZONATATE 100 MG PO CAPS: 100.0000 mg | ORAL_CAPSULE | Freq: Three times a day (TID) | ORAL | Status: DC | PRN

## 2010-12-26 NOTE — Progress Notes (Signed)
  Subjective:    Patient ID: Christina Hayes, female    DOB: 09-17-1950, 60 y.o.   MRN: [id number]  HPI Christina Hayes presents with a 2 week h/o cough, that is harsh with no sputum, she has felt tired and weak. She has had no fever. No N/V No shortness of breath. She does have allergies.  I have reviewed the patient's medical history in detail and updated the computerized patient record.    Review of Systems System review is negative for any constitutional, cardiac, pulmonary, GI or neuro symptoms or complaints other than as described in the HPI.     Objective:   Physical Exam Vitals - afebrile, low BP (chronic) Gen'l - mildly overweight, in no distress HEENT- throat clear, no sinus tenderness Pulm - harsh cough noted, normal breath sounds Cor - RRR       Assessment & Plan:  Cough - cyclical cough w/o evidence of infection.  Plan- anti-tussive, tessalon perles and prednisone burst and taper.

## 2010-12-26 NOTE — Patient Instructions (Signed)
Cough - appears to be no infection. Suspect a "cyclical cough" with tracheal irritation and cough. Plan - Robitussin DM (plain); continue claritin; tessalon perles 100 mg three times a day; prednisone burst and taper.

## 2011-01-24 ENCOUNTER — Telehealth: Payer: Self-pay | Admitting: Internal Medicine

## 2011-01-24 NOTE — Telephone Encounter (Signed)
The pt called and stated she has a red, itchy rash all over.  She is hoping to get an appt to see the Dr and to get an rx for this condition.  Ami advised I send you a note directly.    Thanks!

## 2011-01-25 ENCOUNTER — Ambulatory Visit (INDEPENDENT_AMBULATORY_CARE_PROVIDER_SITE_OTHER): Payer: 59 | Admitting: Internal Medicine

## 2011-01-25 ENCOUNTER — Encounter: Payer: Self-pay | Admitting: Internal Medicine

## 2011-01-25 VITALS — BP 104/62 | HR 73 | Temp 97.3°F | Resp 16 | Wt 170.0 lb

## 2011-01-25 DIAGNOSIS — J309 Allergic rhinitis, unspecified: Secondary | ICD-10-CM

## 2011-01-25 DIAGNOSIS — L309 Dermatitis, unspecified: Secondary | ICD-10-CM

## 2011-01-25 DIAGNOSIS — R609 Edema, unspecified: Secondary | ICD-10-CM | POA: Insufficient documentation

## 2011-01-25 DIAGNOSIS — L259 Unspecified contact dermatitis, unspecified cause: Secondary | ICD-10-CM

## 2011-01-25 MED ORDER — TRIAMCINOLONE ACETONIDE 0.5 % EX CREA: TOPICAL_CREAM | Freq: Three times a day (TID) | CUTANEOUS | Status: DC

## 2011-01-25 MED ORDER — METHYLPREDNISOLONE ACETATE 80 MG/ML IJ SUSP
120.0000 mg | Freq: Once | INTRAMUSCULAR | Status: AC
  Administered 2011-01-25: 120 mg via INTRAMUSCULAR

## 2011-01-25 NOTE — Assessment & Plan Note (Signed)
Depo-medrol IM today

## 2011-01-25 NOTE — Assessment & Plan Note (Signed)
She will continue benadryl as needed and I gave her an injection of depo-medrol IM today for the allergic component and offered her an Rx for topical steroids as well

## 2011-01-25 NOTE — Progress Notes (Signed)
Subjective:    Patient ID: Christina Hayes, female    DOB: 07-06-1950, 61 y.o.   MRN: [id number]  Rash This is a new problem. The current episode started 1 to 4 weeks ago. The problem has been gradually worsening since onset. The rash is diffuse. The rash is characterized by itchiness. She was exposed to nothing. Associated symptoms include congestion and rhinorrhea. Pertinent negatives include no anorexia, cough, diarrhea, eye pain, facial edema, fatigue, fever, joint pain, nail changes, shortness of breath, sore throat or vomiting. Past treatments include antihistamine. The treatment provided mild relief. Her past medical history is significant for allergies.      Review of Systems  Constitutional: Negative for fever, chills, diaphoresis, activity change, appetite change, fatigue and unexpected weight change.  HENT: Positive for congestion, rhinorrhea, sneezing and postnasal drip. Negative for nosebleeds, sore throat, facial swelling, neck pain, neck stiffness, dental problem and sinus pressure.   Eyes: Negative for photophobia, pain, discharge, redness, itching and visual disturbance.  Respiratory: Negative for cough, chest tightness, shortness of breath, wheezing and stridor.   Cardiovascular: Negative for chest pain, palpitations and leg swelling.  Gastrointestinal: Negative for nausea, vomiting, abdominal pain, diarrhea, constipation, blood in stool and anorexia.  Genitourinary: Negative.   Musculoskeletal: Negative.  Negative for joint pain.  Skin: Positive for rash. Negative for nail changes, color change, pallor and wound.  Neurological: Negative for dizziness, tremors, seizures, syncope, facial asymmetry, speech difficulty, weakness, light-headedness, numbness and headaches.  Hematological: Negative for adenopathy. Does not bruise/bleed easily.  Psychiatric/Behavioral: Negative.        Objective:   Physical Exam  Vitals reviewed. Constitutional: She is oriented to person, place,  and time. She appears well-developed and well-nourished. No distress.  HENT:  Head: Normocephalic and atraumatic. No trismus in the jaw.  Right Ear: Hearing, tympanic membrane, external ear and ear canal normal.  Left Ear: Hearing, tympanic membrane, external ear and ear canal normal.  Nose: Mucosal edema and rhinorrhea present. No nose lacerations, sinus tenderness, nasal deformity, septal deviation or nasal septal hematoma. No epistaxis.  No foreign bodies. Right sinus exhibits no maxillary sinus tenderness and no frontal sinus tenderness. Left sinus exhibits no maxillary sinus tenderness and no frontal sinus tenderness.  Mouth/Throat: Oropharynx is clear and moist and mucous membranes are normal. Mucous membranes are not pale, not dry and not cyanotic. No uvula swelling. No oropharyngeal exudate, posterior oropharyngeal edema, posterior oropharyngeal erythema or tonsillar abscesses.  Eyes: Conjunctivae are normal. Right eye exhibits no discharge. Left eye exhibits no discharge. No scleral icterus.  Neck: Normal range of motion. Neck supple. No JVD present. No tracheal deviation present. No thyromegaly present.  Cardiovascular: Normal rate, regular rhythm, normal heart sounds and intact distal pulses.  Exam reveals no gallop and no friction rub.   No murmur heard. Pulmonary/Chest: Effort normal and breath sounds normal. No stridor. No respiratory distress. She has no wheezes. She has no rales. She exhibits no tenderness.  Abdominal: Soft. Bowel sounds are normal. She exhibits no distension and no mass. There is no tenderness. There is no rebound and no guarding.  Musculoskeletal: Normal range of motion. She exhibits no edema and no tenderness.  Lymphadenopathy:    She has no cervical adenopathy.  Neurological: She is oriented to person, place, and time.  Skin: Skin is warm and dry. Rash noted. No abrasion, no bruising, no burn, no ecchymosis, no petechiae and no purpura noted. Rash is papular.  Rash is not macular, not nodular, not pustular, not  vesicular and not urticarial. She is not diaphoretic. No cyanosis or erythema. No pallor. Nails show no clubbing.       She has diffuse areas of faint erythema and eczema with mild xerosis  Psychiatric: She has a normal mood and affect. Her behavior is normal. Judgment and thought content normal.          Assessment & Plan:

## 2011-01-25 NOTE — Telephone Encounter (Signed)
Ma add on to today's schedule

## 2011-01-25 NOTE — Patient Instructions (Signed)

## 2011-02-08 ENCOUNTER — Ambulatory Visit: Payer: 59 | Admitting: Internal Medicine

## 2011-03-14 ENCOUNTER — Telehealth: Payer: Self-pay | Admitting: Internal Medicine

## 2011-03-14 ENCOUNTER — Encounter: Payer: Self-pay | Admitting: Internal Medicine

## 2011-03-14 ENCOUNTER — Ambulatory Visit (INDEPENDENT_AMBULATORY_CARE_PROVIDER_SITE_OTHER): Payer: 59 | Admitting: Internal Medicine

## 2011-03-14 ENCOUNTER — Ambulatory Visit (INDEPENDENT_AMBULATORY_CARE_PROVIDER_SITE_OTHER)
Admission: RE | Admit: 2011-03-14 | Discharge: 2011-03-14 | Disposition: A | Discharge status: Home / Self Care | Payer: 59 | Source: Ambulatory Visit | Attending: Internal Medicine | Admitting: Internal Medicine

## 2011-03-14 DIAGNOSIS — R079 Chest pain, unspecified: Secondary | ICD-10-CM

## 2011-03-14 DIAGNOSIS — M542 Cervicalgia: Secondary | ICD-10-CM

## 2011-03-14 NOTE — Telephone Encounter (Signed)
The pt called and stated she was in a great deal of pain.  She said the pain was in her shoulders and neck.  She spoke with triage and the scheduling desk and stated she could not wait to be seen.  She was worked into your 3:30pm slot.  If this is not okay, please let me know and I will be more than happy to change it.     Thanks!

## 2011-03-14 NOTE — Telephone Encounter (Signed)
OK 

## 2011-03-16 ENCOUNTER — Telehealth: Payer: Self-pay | Admitting: *Deleted

## 2011-03-16 NOTE — Telephone Encounter (Signed)
Message copied by Regis Bill on Fri Mar 16, 2011  1:53 PM ------      Message from: Illene Regulus E      Created: Thu Mar 15, 2011  9:01 AM       Please call patient: there is degenerative disk disease in the cervical spine. At this time I recommend stretches for neck pain. You can research routines on [email or web address]. If you wish a referral to PT let us know. Tylenol or aleve may help the pain.

## 2011-03-16 NOTE — Telephone Encounter (Signed)
Discussed MD recommendations w/patient. She chooses to try the stretch routines & OTC medications; she will call us when/if she desires PT referral.

## 2011-03-18 NOTE — Progress Notes (Signed)
Subjective:    Patient ID: Christina Hayes, female    DOB: Mar 19, 1950, 61 y.o.   MRN: [id number]  HPI Christina Hayes is seen on an acute basis after calling this AM with h/o chest pain. She states that she has had an aching pain in the central chest, trapezius area, to upper back and proximal arms. She has had discomfort on and off but today the discomfort is worse. She has no prior h/o heart disease and a low to moderate risk profile. She also has no h/o neck problems. She denies any DOE, exertional related chest discomfort, change in exercise tolerance or diaphoresis. Furthermore, she does not have heartburn or reflux type symptoms.  Past Medical History  Diagnosis Date  . HYPERLIPIDEMIA, MIXED 12/03/2006  . VERTIGO, HX OF 12/03/2006  . MIGRAINES, HX OF 12/03/2006  . Allergic rhinitis, cause unspecified    Past Surgical History  Procedure Date  . Abdominal hysterectomy   . Salpingoophorectomy    Family History  Problem Relation Age of Onset  . Cancer Mother     Colon Cancer survivor  . Diabetes Mother   . Diabetes Father   . Hyperlipidemia Father   . Alzheimer's disease Father   . Cancer Maternal Grandmother     Breast Cancer   History   Social History  . Marital Status: Married    Spouse Name: N/A    Number of Children: 1  . Years of Education: 12   Occupational History  . Retired    Social History Main Topics  . Smoking status: Never Smoker   . Smokeless tobacco: Not on file  . Alcohol Use: No  . Drug Use: No  . Sexually Active: Yes    Birth Control/ Protection: Surgical   Other Topics Concern  . Not on file   Social History Narrative   HSGMarried 22-divorced, remarried '081 daughter '79Work: retired from Production designer, theatre/television/film for [employer] exercise-no       Review of Systems System review is negative for any constitutional, cardiac, pulmonary, GI or neuro symptoms or complaints other than as described in the HPI.     Objective:   Physical Exam Filed Vitals:     03/14/11 1542  BP: 114/72  Pulse: 65  Temp: 98.1 F (36.7 C)  Resp: 14   Weight: 165 lb 4 oz (74.957 kg)  There is no height on file to calculate BMI.   Gen'l- pleasant white woman in no distress HEENT- C&S clear, EOMI Neck- full active ROM Cor - 2+ radial pulse, RRR w/o M/R/G Pulm - normal respirations Abd - BS+, no guarding or rebound, no significant tenderness to palpation  12 lead EKG - no evidence of ischemic changes or acute injury. There is a sinus bradycardia approximately 56 bpm with RSR' V1       Assessment & Plan:  Chest pain - h/o and exam do not favor anginal pain. EKG is unremarkable. Risk factors: peri-menopausal, hyperlipidemic on "statin" therapy. Plan - no further risk stratification at this time.  Upper back and arm pain - distribution and nature of the pain suggest possible cervical origin. No evidence of a radiculopathy. Plan - c-spine series with recommendations to follow.   CERVICAL SPINE - COMPLETE 4+ VIEW  Comparison: None.  Findings: No prevertebral soft tissue swelling is seen. Minimal  calcification to the right of C5 is seen in the soft tissues. This  could be vascular.  There is straightening of the normal cervical lordosis. This may  be associated  with muscle spasm. There is narrowing of  intervertebral disc spaces at levels of C4-C5, C5-C6 and C6-C7.  This is consistent with degenerative disc disease. There is  marginal osteophyte formation at multiple levels representing  degenerative spondylosis. No fracture or bony destruction is seen.  There is 2 mm of subluxation of the body of C5 posteriorly on the  body of C6. The foramina appear patent. There is minimal  uncovertebral spurring on the left at the level of C6-C7. No  cervical ribs are evident.  IMPRESSION:  Minimal soft tissue calcification on the right is seen. This could  be vascular.  Straightening of normal cervical lordosis. This may be associated  muscle spasm.  Changes  of degenerative disc disease and degenerative spondylosis.  Slight posterior subluxation of body of C5 on body of C6.  Original Report Authenticated By: Crawford Givens, M.D.  For discomfort that is most likely related to DDD/DJD will recommend flex-stretch exercise otc NSAID of choice. May benefit from PT - [facility].

## 2011-04-05 ENCOUNTER — Other Ambulatory Visit: Payer: Self-pay | Admitting: Urology

## 2011-04-17 ENCOUNTER — Encounter (HOSPITAL_BASED_OUTPATIENT_CLINIC_OR_DEPARTMENT_OTHER): Payer: Self-pay | Admitting: *Deleted

## 2011-04-19 ENCOUNTER — Encounter (HOSPITAL_BASED_OUTPATIENT_CLINIC_OR_DEPARTMENT_OTHER): Payer: Self-pay | Admitting: *Deleted

## 2011-04-23 ENCOUNTER — Encounter (HOSPITAL_BASED_OUTPATIENT_CLINIC_OR_DEPARTMENT_OTHER): Payer: Self-pay | Admitting: *Deleted

## 2011-04-23 NOTE — Progress Notes (Signed)
NPO AFTER MN. ARRIVES AT 0615. NEEDS HG AND EKG. WILL TAKE PEPCID AM OF SURG. W/ SIP OF WATER. REVIEWED [facility] GUIDELINES , WILL BRING MEDS AND CPAP.

## 2011-04-29 NOTE — Anesthesia Preprocedure Evaluation (Addendum)
Anesthesia Evaluation  Patient identified by MRN, date of birth, ID band Patient awake    Reviewed: Allergy & Precautions, H&P , NPO status , Patient's Chart, lab work & pertinent test results  Airway Mallampati: II TM Distance: >3 FB Neck ROM: Full    Dental No notable dental hx.    Pulmonary neg pulmonary ROS,  breath sounds clear to auscultation  Pulmonary exam normal       Cardiovascular Rhythm:Regular Rate:Normal  ECG: SB, probably normal   Neuro/Psych C-spine xray reviewed. negative psych ROS   GI/Hepatic Neg liver ROS, GERD-  Medicated,Chronic intermittent diarrhea. Nausea this AM which she attributes to anxiety.   Endo/Other  negative endocrine ROS  Renal/GU negative Renal ROS  negative genitourinary   Musculoskeletal negative musculoskeletal ROS (+)   Abdominal (+) + obese,   Peds negative pediatric ROS (+)  Hematology negative hematology ROS (+)   Anesthesia Other Findings   Reproductive/Obstetrics negative OB ROS                          Anesthesia Physical Anesthesia Plan  ASA: II  Anesthesia Plan: General   Post-op Pain Management:    Induction: Intravenous  Airway Management Planned: LMA  Additional Equipment:   Intra-op Plan:   Post-operative Plan: Extubation in OR  Informed Consent: I have reviewed the patients History and Physical, chart, labs and discussed the procedure including the risks, benefits and alternatives for the proposed anesthesia with the patient or authorized representative who has indicated his/her understanding and acceptance.   Dental advisory given  Plan Discussed with: CRNA  Anesthesia Plan Comments:         Anesthesia Quick Evaluation

## 2011-04-29 NOTE — H&P (Signed)
Urology Admission H&P  Chief Complaint: SUI for sub-urethral sling  History of Present Illness: Christina Hayes presented as a self referral for further assessment and potential treatment of some very longstanding and progressive urinary incontinence and bladder overactivity. Of note, I do care for her husband. She has had many years of urinary incontinence which has slowly but progressively worsened. By description she has both stress as well as urge leakage, but it does appear that the stress is more problematic than her urge incontinence. She clearly has substantial bladder overactivity with nocturia of 3, 4+ times per evening. Urination is frequent and often urgent. She has no pain with voiding and has not had any obvious obstructive symptoms. The patient reports a prior hysterectomy with "bladder tack" in the 1980's. She does not appear to have any significant prolapse symptoms. She has not had problematic bacterial cystitis.   Christina Hayes returned status post her video urodynamics. She has had some pretty longstanding and progressive incontinence that has affected her quality of life and has been reasonably aggravated. At the time of urodynamics she did appear to be emptying her bladder well. She clearly had hypersensitivity with the first sensation occurring as little as 50 cc in her bladder. She did have some mild instability but it did not occur until 180 cc were instilled and the pressures were really quite low at only 7 cm of water pressure without leakage. What she clearly had was fairly severe stress incontinence with clear evidence of hypermobility of her urethra and a low Valsalva leak-point pressure of around 31-33 cm of water pressure at 100 cc volumes. It appears, again, that she does have some bladder over sensitivity and mild instability but her primarily problem is lack of urethral support with hypermobility and documented stress leakage. Her clinical situation is otherwise unchanged.       Past  Medical History  Diagnosis Date  . HYPERLIPIDEMIA, MIXED 12/03/2006    PT STATES NOT TAKING MED AS PRESCRIBED  . VERTIGO, HX OF 12/03/2006  . MIGRAINES, HX OF 12/03/2006  . Allergic rhinitis, cause unspecified   . SUI (stress urinary incontinence, female)   . OSA on CPAP   . Head cold   . Cough   . Frequency of urination   . Urgency of urination   . Nocturia   . Acid reflux   . Complication of anesthesia SLOW TO WAKE   Past Surgical History  Procedure Date  . Salpingoophorectomy 1995    BILATERAL  . Vaginal hysterectomy 1988    Home Medications:  No prescriptions prior to admission   Allergies:  Allergies  Allergen Reactions  . Codeine Itching    Family History  Problem Relation Age of Onset  . Cancer Mother     Colon Cancer survivor  . Diabetes Mother   . Diabetes Father   . Hyperlipidemia Father   . Alzheimer's disease Father   . Cancer Maternal Grandmother     Breast Cancer   Social History:  reports that she has never smoked. She has never used smokeless tobacco. She reports that she does not drink alcohol or use illicit drugs.  Review of Systems  Gastrointestinal: Positive for diarrhea.  Genitourinary: Positive for urgency and frequency.    Physical Exam:  Vital signs in last 24 hours:   Physical Exam  Constitutional: She is oriented to person, place, and time. She appears well-developed and well-nourished. No distress.  HENT:  Head: Normocephalic and atraumatic.  Cardiovascular: Normal rate and regular rhythm.  Respiratory: Effort normal. No respiratory distress.  GI: Soft. She exhibits no distension. There is no rebound.  Genitourinary: Vagina normal.  Musculoskeletal: Normal range of motion. She exhibits no edema.  Neurological: She is alert and oriented to person, place, and time.  Skin: Skin is warm and dry.  Psychiatric: She has a normal mood and affect.    Laboratory Data:  No results found for this or any previous visit (from the past  24 hour(s)). No results found for this or any previous visit (from the past 240 hour(s)). Creatinine: No results found for this basename: CREATININE:7 in the last 168 hours Baseline Creatinine:   Impression/Assessment:  SUI  Plan:  Christina Hayes is quite bothered by her leakage and I do think she is a good candidate for a suburethral sling. We talked about the procedure, the recovery, success rates and potential complications. I would be hopeful that some of her bladder overactivity and urgency would improve, but certainly we cannot guarantee that and there are rare cases where that can actually worsen status post suburethral sling. She does have some ongoing issues with a cervical neck that needs to be addressed. We are going to submit a request for surgery and then she can ultimately decide if it is best to have that done as soon as possible or whether it needs to be delayed for a short period of time or not.     Christina Hayes S 04/29/2011, 10:10 PM

## 2011-04-30 ENCOUNTER — Ambulatory Visit (HOSPITAL_BASED_OUTPATIENT_CLINIC_OR_DEPARTMENT_OTHER): Payer: 59 | Admitting: Anesthesiology

## 2011-04-30 ENCOUNTER — Ambulatory Visit (HOSPITAL_BASED_OUTPATIENT_CLINIC_OR_DEPARTMENT_OTHER)
Admission: RE | Admit: 2011-04-30 | Discharge: 2011-05-01 | Disposition: A | Discharge status: Home / Self Care | Payer: 59 | Source: Ambulatory Visit | Attending: Urology | Admitting: Urology

## 2011-04-30 ENCOUNTER — Encounter (HOSPITAL_BASED_OUTPATIENT_CLINIC_OR_DEPARTMENT_OTHER): Payer: Self-pay | Admitting: *Deleted

## 2011-04-30 ENCOUNTER — Encounter (HOSPITAL_BASED_OUTPATIENT_CLINIC_OR_DEPARTMENT_OTHER)
Admission: RE | Disposition: A | Discharge status: Home / Self Care | Payer: Self-pay | Source: Ambulatory Visit | Attending: Urology

## 2011-04-30 ENCOUNTER — Encounter (HOSPITAL_BASED_OUTPATIENT_CLINIC_OR_DEPARTMENT_OTHER): Payer: Self-pay | Admitting: Anesthesiology

## 2011-04-30 DIAGNOSIS — N393 Stress incontinence (female) (male): Secondary | ICD-10-CM | POA: Insufficient documentation

## 2011-04-30 DIAGNOSIS — K219 Gastro-esophageal reflux disease without esophagitis: Secondary | ICD-10-CM | POA: Insufficient documentation

## 2011-04-30 HISTORY — DX: Dependence on other enabling machines and devices: Z99.89

## 2011-04-30 HISTORY — DX: Nocturia: R35.1

## 2011-04-30 HISTORY — DX: Cough, unspecified: R05.9

## 2011-04-30 HISTORY — DX: Obstructive sleep apnea (adult) (pediatric): G47.33

## 2011-04-30 HISTORY — PX: PUBOVAGINAL SLING: SHX1035

## 2011-04-30 HISTORY — DX: Frequency of micturition: R35.0

## 2011-04-30 HISTORY — DX: Stress incontinence (female) (male): N39.3

## 2011-04-30 HISTORY — DX: Adverse effect of unspecified anesthetic, initial encounter: T41.45XA

## 2011-04-30 HISTORY — DX: Urgency of urination: R39.15

## 2011-04-30 HISTORY — DX: Acute nasopharyngitis (common cold): J00

## 2011-04-30 HISTORY — DX: Other complications of anesthesia, initial encounter: T88.59XA

## 2011-04-30 HISTORY — DX: Gastro-esophageal reflux disease without esophagitis: K21.9

## 2011-04-30 HISTORY — DX: Cough: R05

## 2011-04-30 LAB — POCT I-STAT 4, (NA,K, GLUC, HGB,HCT)
HCT: 38 % (ref 36.0–46.0)
Hemoglobin: 12.9 g/dL (ref 12.0–15.0)

## 2011-04-30 SURGERY — CREATION, PUBOVAGINAL SLING
Anesthesia: General | Site: Urethra | Wound class: Clean Contaminated

## 2011-04-30 MED ORDER — PROPOFOL 10 MG/ML IV EMUL
INTRAVENOUS | Status: DC | PRN
  Administered 2011-04-30: 150 mg via INTRAVENOUS
  Administered 2011-04-30: 50 mg via INTRAVENOUS

## 2011-04-30 MED ORDER — STERILE WATER FOR IRRIGATION IR SOLN
Status: DC | PRN
  Administered 2011-04-30: 1

## 2011-04-30 MED ORDER — CIPROFLOXACIN IN D5W 400 MG/200ML IV SOLN
400.0000 mg | Freq: Two times a day (BID) | INTRAVENOUS | Status: DC
  Administered 2011-04-30: 400 mg via INTRAVENOUS

## 2011-04-30 MED ORDER — DROPERIDOL 2.5 MG/ML IJ SOLN
INTRAMUSCULAR | Status: DC | PRN
  Administered 2011-04-30: 0.625 mg via INTRAVENOUS

## 2011-04-30 MED ORDER — CIPROFLOXACIN IN D5W 400 MG/200ML IV SOLN
400.0000 mg | INTRAVENOUS | Status: AC
  Administered 2011-04-30: 400 mg via INTRAVENOUS

## 2011-04-30 MED ORDER — FENTANYL CITRATE 0.05 MG/ML IJ SOLN: 25.0000 ug | INTRAMUSCULAR | Status: DC | PRN

## 2011-04-30 MED ORDER — HYDROCODONE-ACETAMINOPHEN 5-325 MG PO TABS
1.0000 | ORAL_TABLET | ORAL | Status: DC | PRN
  Administered 2011-04-30 – 2011-05-01 (×2): 1 via ORAL

## 2011-04-30 MED ORDER — PROMETHAZINE HCL 25 MG/ML IJ SOLN: 6.2500 mg | INTRAMUSCULAR | Status: DC | PRN

## 2011-04-30 MED ORDER — DEXAMETHASONE SODIUM PHOSPHATE 4 MG/ML IJ SOLN
INTRAMUSCULAR | Status: DC | PRN
  Administered 2011-04-30: 10 mg via INTRAVENOUS

## 2011-04-30 MED ORDER — BACITRACIN ZINC 500 UNIT/GM EX OINT
TOPICAL_OINTMENT | CUTANEOUS | Status: DC | PRN
  Administered 2011-04-30: 1 via TOPICAL

## 2011-04-30 MED ORDER — FENTANYL CITRATE 0.05 MG/ML IJ SOLN
INTRAMUSCULAR | Status: DC | PRN
  Administered 2011-04-30: 50 ug via INTRAVENOUS
  Administered 2011-04-30 (×2): 25 ug via INTRAVENOUS

## 2011-04-30 MED ORDER — ONDANSETRON HCL 4 MG/2ML IJ SOLN: 4.0000 mg | INTRAMUSCULAR | Status: DC | PRN

## 2011-04-30 MED ORDER — GABAPENTIN 600 MG PO TABS
600.0000 mg | ORAL_TABLET | Freq: Every evening | ORAL | Status: DC
Start: 2011-04-30 — End: 2011-05-01

## 2011-04-30 MED ORDER — LACTATED RINGERS IV SOLN
INTRAVENOUS | Status: DC
Start: 2011-04-30 — End: 2011-04-30
  Administered 2011-04-30 (×2): via INTRAVENOUS

## 2011-04-30 MED ORDER — FENTANYL CITRATE 0.05 MG/ML IJ SOLN
25.0000 ug | INTRAMUSCULAR | Status: DC | PRN
  Administered 2011-04-30: 100 ug via INTRAVENOUS

## 2011-04-30 MED ORDER — SODIUM CHLORIDE 0.9 % IR SOLN
Status: DC | PRN
  Administered 2011-04-30: 08:00:00

## 2011-04-30 MED ORDER — LIDOCAINE HCL (CARDIAC) 20 MG/ML IV SOLN
INTRAVENOUS | Status: DC | PRN
  Administered 2011-04-30: 40 mg via INTRAVENOUS

## 2011-04-30 MED ORDER — MIDAZOLAM HCL 5 MG/5ML IJ SOLN
INTRAMUSCULAR | Status: DC | PRN
  Administered 2011-04-30: 1 mg via INTRAVENOUS

## 2011-04-30 MED ORDER — ONDANSETRON HCL 4 MG/2ML IJ SOLN
4.0000 mg | Freq: Once | INTRAMUSCULAR | Status: AC
  Administered 2011-04-30: 4 mg via INTRAVENOUS

## 2011-04-30 MED ORDER — LORATADINE 10 MG PO TABS
10.0000 mg | ORAL_TABLET | Freq: Every evening | ORAL | Status: DC
Start: 2011-04-30 — End: 2011-05-01

## 2011-04-30 MED ORDER — KCL IN DEXTROSE-NACL 20-5-0.45 MEQ/L-%-% IV SOLN
INTRAVENOUS | Status: DC
  Administered 2011-04-30: 20:00:00 via INTRAVENOUS

## 2011-04-30 MED ORDER — MORPHINE SULFATE 2 MG/ML IJ SOLN: 2.0000 mg | INTRAMUSCULAR | Status: DC | PRN

## 2011-04-30 MED ORDER — LIDOCAINE-EPINEPHRINE (PF) 1 %-1:200000 IJ SOLN
INTRAMUSCULAR | Status: DC | PRN
  Administered 2011-04-30: 9 mL

## 2011-04-30 MED ORDER — KETOROLAC TROMETHAMINE 30 MG/ML IJ SOLN
INTRAMUSCULAR | Status: DC | PRN
  Administered 2011-04-30: 30 mg via INTRAVENOUS

## 2011-04-30 MED ORDER — FAMOTIDINE 20 MG PO TABS: 20.0000 mg | ORAL_TABLET | ORAL | Status: DC | PRN

## 2011-04-30 SURGICAL SUPPLY — 36 items
BAG DRAIN URO-CYSTO SKYTR STRL (DRAIN) ×2 IMPLANT
BLADE SURG 10 STRL SS (BLADE) ×2 IMPLANT
BLADE SURG 15 STRL LF DISP TIS (BLADE) ×1 IMPLANT
BLADE SURG 15 STRL SS (BLADE) ×1
BLADE SURG ROTATE 9660 (MISCELLANEOUS) ×2 IMPLANT
CATH FOLEY 2WAY SLVR  5CC 16FR (CATHETERS) ×1
CATH FOLEY 2WAY SLVR 5CC 16FR (CATHETERS) ×1 IMPLANT
CLOTH BEACON ORANGE TIMEOUT ST (SAFETY) ×2 IMPLANT
COVER MAYO STAND STRL (DRAPES) ×2 IMPLANT
DERMABOND ADVANCED (GAUZE/BANDAGES/DRESSINGS) ×1
DERMABOND ADVANCED .7 DNX12 (GAUZE/BANDAGES/DRESSINGS) ×1 IMPLANT
ELECT REM PT RETURN 9FT ADLT (ELECTROSURGICAL) ×2
ELECTRODE REM PT RTRN 9FT ADLT (ELECTROSURGICAL) ×1 IMPLANT
GLOVE BIO SURGEON STRL SZ 6.5 (GLOVE) ×4 IMPLANT
GLOVE BIO SURGEON STRL SZ7.5 (GLOVE) ×2 IMPLANT
GLOVE INDICATOR 6.5 STRL GRN (GLOVE) ×2 IMPLANT
GLOVE INDICATOR 7.0 STRL GRN (GLOVE) ×2 IMPLANT
GOWN PREVENTION PLUS LG XLONG (DISPOSABLE) ×4 IMPLANT
GOWN STRL REIN XL XLG (GOWN DISPOSABLE) ×2 IMPLANT
HEMOSTAT SURGICEL 2X14 (HEMOSTASIS) ×2 IMPLANT
NEEDLE HYPO 22GX1.5 SAFETY (NEEDLE) ×2 IMPLANT
PACK BASIN DAY SURGERY FS (CUSTOM PROCEDURE TRAY) ×2 IMPLANT
PACK CYSTOSCOPY (CUSTOM PROCEDURE TRAY) ×2 IMPLANT
PACKING VAGINAL (PACKING) ×2 IMPLANT
PENCIL BUTTON HOLSTER BLD 10FT (ELECTRODE) ×2 IMPLANT
PLUG CATH AND CAP STER (CATHETERS) ×2 IMPLANT
SLING LYNX SUPRAPUBIC (Sling) ×2 IMPLANT
SUT SILK 2 0 FS (SUTURE) ×2 IMPLANT
SUT VIC AB 2-0 UR5 27 (SUTURE) ×2 IMPLANT
SUT VICRYL 3 0 UR 6 27 (SUTURE) IMPLANT
SYR BULB IRRIGATION 50ML (SYRINGE) ×2 IMPLANT
SYRINGE 10CC LL (SYRINGE) ×2 IMPLANT
TUBE CONNECTING 12X1/4 (SUCTIONS) ×2 IMPLANT
WATER STERILE IRR 3000ML UROMA (IV SOLUTION) ×2 IMPLANT
WATER STERILE IRR 500ML POUR (IV SOLUTION) ×2 IMPLANT
YANKAUER SUCT BULB TIP NO VENT (SUCTIONS) ×2 IMPLANT

## 2011-04-30 NOTE — Transfer of Care (Signed)
Immediate Anesthesia Transfer of Care Note  Patient: Christina Hayes  Procedure(s) Performed: Procedure(s) (LRB): PUBO-VAGINAL SLING (N/A)  Patient Location: PACU  Anesthesia Type: General  Level of Consciousness: drowsy, arouses to name, follows commands  Airway & Oxygen Therapy: Patient Spontanous Breathing and Patient connected to face mask oxygen  Post-op Assessment: Report given to PACU RN and Post -op Vital signs reviewed and stable  Post vital signs: Reviewed and stable  Complications: No apparent anesthesia complications

## 2011-04-30 NOTE — Op Note (Signed)
Preoperative diagnosis: Stress urinary incontinence  Postoperative diagnosis: Same   Procedure: Suburethral retropubic sling  Surgeon Conley Pawling S.Acen Craun, M.D.  Anesthesia: Gen.  Indication: The patient has had a long-standing stress urinary incontinence. The patient underwent video urodynamics which showed mild bladder instability. Stress urinary incontinence was demonstrated. We discussed the advantages disadvantages as well as the risks of suburethral sling surgery. We went over success rates as well as complications. We discussed the possibility of postoperative urinary retention as well as possible ongoing stress incontinence. She appeared to understand the risks and benefits of this procedure and full informed consent has been obtained.  Technique and findings: The patient was brought to the operating room where she had successful induction of general anesthesia. She had placement of PAS compression boots and received a perioperative antibiotic. She was placed in lithotomy position with all extremities carefully padded. She was then prepped and draped in the usual manner. Appropriate surgical timeout was performed. A weighted vaginal speculum was used. A Foley catheter was inserted and the bladder was completely drained. The anterior vaginal mucosa at the mid urethral level was infiltrated with lidocaine. A small incision was then made over the mid urethra and the dissection carried out bilaterally until we were able to palpate underneath the retropubic space on both sides. 2 small stab incisions were made in the retro pubic area just to the lateral of the midline. Then utilizing direct digital finger control, the needles were passed from the retropubic incision out the right and left sides of the vaginal incision. Flexible cystoscopy was then performed after removal of Foley catheter. Needle passage appeared to be in good position and there was no evidence of perforation of the bladder. A Foley  catheter was reinserted and the bladder again completely drained. The Lynx sling was then attached to the needles and the sling was brought out the retropubic incision bilaterally. A right angle clamp was placed at the mid urethra up behind the sling to assure proper tensioning. The sleeves on the sling were then removed in a standard manner and the redundant sling cut at the level of the skin in the retropubic region. The vaginal incision was copiously irrigated with antibiotic solution. A small piece of Surgicel was placed up for some mild vaginal oozing. The vaginal mucosa was then closed with a running 2-0 Vicryl suture. Vaginal packing utilizing bacitracin ointment was then placed. The retropubic incisions were closed with Dermabond. The Foley catheter was left indwelling to gravity drainage. All sponge and needle counts were correct. The patient was brought to recovery room in stable condition.  

## 2011-04-30 NOTE — Anesthesia Procedure Notes (Signed)
Procedure Name: LMA Insertion Date/Time: 04/30/2011 7:47 AM Performed by: Norva Pavlov Pre-anesthesia Checklist: Patient identified, Emergency Drugs available, Suction available and Patient being monitored Patient Re-evaluated:Patient Re-evaluated prior to inductionOxygen Delivery Method: Circle System Utilized Preoxygenation: Pre-oxygenation with 100% oxygen Intubation Type: IV induction Ventilation: Mask ventilation without difficulty LMA: LMA inserted LMA Size: 4.0 Number of attempts: 1 Airway Equipment and Method: bite block Placement Confirmation: positive ETCO2 Tube secured with: Tape Dental Injury: Teeth and Oropharynx as per pre-operative assessment

## 2011-04-30 NOTE — Anesthesia Postprocedure Evaluation (Signed)
  Anesthesia Post-op Note  Patient: Christina Hayes  Procedure(s) Performed: Procedure(s) (LRB): PUBO-VAGINAL SLING (N/A)  Patient Location: PACU  Anesthesia Type: General  Level of Consciousness: awake and alert   Airway and Oxygen Therapy: Patient Spontanous Breathing  Post-op Pain: mild  Post-op Assessment: Post-op Vital signs reviewed, Patient's Cardiovascular Status Stable, Respiratory Function Stable, Patent Airway and No signs of Nausea or vomiting  Post-op Vital Signs: stable  Complications: No apparent anesthesia complications

## 2011-04-30 NOTE — Interval H&P Note (Signed)
History and Physical Interval Note:  04/30/2011 7:35 AM  Christina Hayes  has presented today for surgery, with the diagnosis of stress urinary incontinence  The various methods of treatment have been discussed with the patient and family. After consideration of risks, benefits and other options for treatment, the patient has consented to  Procedure(s) (LRB): PUBO-VAGINAL SLING (N/A) as a surgical intervention .  The patients' history has been reviewed, patient examined, no change in status, stable for surgery.  I have reviewed the patients' chart and labs.  Questions were answered to the patient's satisfaction.     Christina Hayes S

## 2011-05-01 ENCOUNTER — Encounter (HOSPITAL_BASED_OUTPATIENT_CLINIC_OR_DEPARTMENT_OTHER): Payer: Self-pay | Admitting: Urology

## 2011-05-01 LAB — HEMOGLOBIN AND HEMATOCRIT, BLOOD: Hemoglobin: 10.4 g/dL — ABNORMAL LOW (ref 12.0–15.0)

## 2011-05-01 MED ORDER — HYDROCODONE-ACETAMINOPHEN 5-325 MG PO TABS: 1.0000 | ORAL_TABLET | ORAL | Status: AC | PRN

## 2011-05-01 NOTE — Discharge Instructions (Signed)
Post bladder suspension instructions (sling)  Catheter care:  You may or may not go home with a catheter or tube in your bladder. If you or urinating normally, you will probably not need a tube. If you're not emptying normally, some form of drainage is needed. The options include a catheter from the abdomen (called SP tube), a catheter from the urethra, or a self-catheterization routine at timed intervals. These will be discussed with you before your discharge. The type depends on your individual case and preferences. Ask [place] if you have any questions about the catheter management.  Diet:  You may return to your normal diet within 24 hours following your surgery. You may note some mild nausea and possibly vomiting the first 6-8 hours following surgery. This is usually due to the side effects of anesthesia, and will disappear quite soon. I would suggest clear liquids and a very light meal the first evening following your surgery.  Activity:  Your physical activity is to be restricted, especially during the first 2 weeks home. During this time use the following guidelines:   No lifting heavy objects (anything greater than 10 pounds).  No driving a car and limit long car rides.  No strenuous exercise, limits stairclimbing to a minimum.  Bowels:  You may need a stool softener and. A bowel movement every other day is reasonable. Use a mild laxative if needed, such as milk of magnesia 2-3 tablespoons, or 2 Dulcolax tablets. Call if you continue to have problems. If you had been taking narcotics for pain, before, during or after your surgery, you may be constipated. Take a laxative if necessary.  Medication:  You should resume your pre-surgery medications unless told not to. In addition you may be given an antibiotic to prevent or treat infection. Antibiotics are not always necessary. Pain pills (Tylox or Vicodin) may also be given to help with the incision and catheter discomfort. Tylenol  (acetaminophen) or Advil (ibuprofen) which have no narcotics Arbetter if the pain is not too bad. All medication should be taken as prescribed until the bottles are finished unless you are having an unusual reaction to one of the drugs.  Problems you should report to [place]:  a. Fever greater than 101F. b. Heavy bleeding, or clots (see notes above about blood in urine). c. Inability to urinate. d. Drug reactions (hives, rash, nausea, vomiting, diarrhea). e. Severe burning or pain with urination that is not improving.

## 2011-06-04 ENCOUNTER — Ambulatory Visit: Payer: 59 | Admitting: Family Medicine

## 2011-06-04 ENCOUNTER — Ambulatory Visit: Payer: 59

## 2011-06-04 ENCOUNTER — Encounter: Payer: Self-pay | Admitting: Physician Assistant

## 2011-06-04 VITALS — BP 134/80 | HR 89 | Temp 98.7°F | Resp 16 | Ht 62.5 in | Wt 166.6 lb

## 2011-06-04 DIAGNOSIS — J309 Allergic rhinitis, unspecified: Secondary | ICD-10-CM

## 2011-06-04 DIAGNOSIS — R062 Wheezing: Secondary | ICD-10-CM

## 2011-06-04 DIAGNOSIS — R05 Cough: Secondary | ICD-10-CM

## 2011-06-04 DIAGNOSIS — J189 Pneumonia, unspecified organism: Secondary | ICD-10-CM

## 2011-06-04 MED ORDER — BENZONATATE 100 MG PO CAPS: 100.0000 mg | ORAL_CAPSULE | Freq: Three times a day (TID) | ORAL | Status: AC | PRN

## 2011-06-04 MED ORDER — ALBUTEROL SULFATE (2.5 MG/3ML) 0.083% IN NEBU
2.5000 mg | INHALATION_SOLUTION | Freq: Once | RESPIRATORY_TRACT | Status: AC
  Administered 2011-06-04: 2.5 mg via RESPIRATORY_TRACT

## 2011-06-04 MED ORDER — IPRATROPIUM BROMIDE 0.02 % IN SOLN
0.5000 mg | Freq: Once | RESPIRATORY_TRACT | Status: AC
  Administered 2011-06-04: 0.5 mg via RESPIRATORY_TRACT

## 2011-06-04 MED ORDER — CLARITHROMYCIN ER 500 MG PO TB24: 1000.0000 mg | ORAL_TABLET | Freq: Every day | ORAL | Status: AC

## 2011-06-04 MED ORDER — GUAIFENESIN ER 1200 MG PO TB12: 1.0000 | ORAL_TABLET | Freq: Two times a day (BID) | ORAL | Status: DC | PRN

## 2011-06-04 MED ORDER — MONTELUKAST SODIUM 10 MG PO TABS: 10.0000 mg | ORAL_TABLET | Freq: Every day | ORAL | Status: DC

## 2011-06-04 NOTE — Progress Notes (Signed)
  Subjective:    Patient ID: Christina Hayes, female    DOB: Oct 02, 1950, 61 y.o.   MRN: [id number]  HPI  Presents with cough x 2-3 days.  Cough is productive.  Accompanied by nasal congestion and drainage. Gets a persistent cough several times each year.  Usually spring, fall and winter.  Last episode in 12/2010.  Required prednisone.  Frustrated by the recurrence.  Episodes have been recurring for 4 years.  Married then, and retired from work.  Her husband brought his dog, and she still has hers.  The home is mostly carpeted.  She has chronic allergies, which she treats with Claritin daily.  States that she's tried "everything" but Claritin seems to work the best.  Has tried nasal sprays, but not on-goingly.  Review of Systems Thinks she has a low grade fever (notes her temperature is typically lower than "normal"). No GI/GU symptoms.    Objective:   Physical Exam Vital signs noted. Well-developed, well nourished WF who is awake, alert and oriented, in NAD. HEENT: [place]/AT, PERRL, EOMI.  Sclera and conjunctiva are clear.  EAC are patent, TMs are normal in appearance. Nasal mucosa is dark pink and moist. OP is clear. Neck: supple, non-tender, no lymphadenopathy, thyromegaly. Heart: RRR, no murmur Lungs: wheezes and rhonchi throughout. Extremities: no cyanosis, clubbing or edema. Skin: warm and dry without rash.  CXR: [facility] reading (PRIMARY) by  Dr. Patsy Lager. Retrocardiac infiltrate.        Assessment & Plan:   1. Cough  DG Chest 2 View, benzonatate (TESSALON) 100 MG capsule, Guaifenesin (MUCINEX MAXIMUM STRENGTH) 1200 MG TB12  2. Wheezing  albuterol (PROVENTIL) (2.5 MG/3ML) 0.083% nebulizer solution 2.5 mg, ipratropium (ATROVENT) nebulizer solution 0.5 mg, montelukast (SINGULAIR) 10 MG tablet; she has an albuterol inhaler at home.  3. Pneumonia  clarithromycin (BIAXIN XL) 500 MG 24 hr tablet  4. AR (allergic rhinitis)  montelukast (SINGULAIR) 10 MG tablet   Re-evaluate here or with PCP in  4 weeks, sooner if needed.  Discussed with Dr. Patsy Lager.

## 2011-06-04 NOTE — Patient Instructions (Addendum)
Get lots of rest, and drink at least 64 ounces of water daily. Follow-up here or with your primary care provider in 4 weeks, sooner if needed.

## 2011-06-07 ENCOUNTER — Telehealth: Payer: Self-pay

## 2011-06-07 NOTE — Telephone Encounter (Signed)
Pt was seen Monday for pneumonia, she wanted to get a rx for an inhaler.

## 2011-06-08 MED ORDER — ALBUTEROL SULFATE HFA 108 (90 BASE) MCG/ACT IN AERS: 2.0000 | INHALATION_SPRAY | RESPIRATORY_TRACT | Status: DC | PRN

## 2011-06-08 NOTE — Telephone Encounter (Signed)
Done and sent in 

## 2011-06-08 NOTE — Telephone Encounter (Signed)
Pt is requesting a inhaler and would like one called in to [facility].

## 2011-06-08 NOTE — Telephone Encounter (Signed)
Patient was given rx for Tessalon, Biaxin, Mucinex, and Singulair. Nebulizer was given at OV. Please advise

## 2011-06-09 NOTE — Telephone Encounter (Signed)
Patient notified

## 2011-06-11 ENCOUNTER — Telehealth: Payer: Self-pay | Admitting: *Deleted

## 2011-06-11 DIAGNOSIS — R05 Cough: Secondary | ICD-10-CM

## 2011-06-11 NOTE — Telephone Encounter (Signed)
Spoke with patient and noted no changes in Tx . Patient care coordinators will contact her with date and time of appt. With pulmonologist.

## 2011-06-11 NOTE — Telephone Encounter (Signed)
Patient called requesting referral to [facility] Pulmonary department [phone]  for follow up from visit at Urgent Care on may 13th.. She was told she had pneumonia and was told to follow up in 4 weeks. Patient states she still has recurring cough.. Please advise. sue

## 2011-06-11 NOTE — Telephone Encounter (Signed)
UC visit reviewed - no tx changes recommended but will refer to [facility] pulm as requested

## 2011-07-12 ENCOUNTER — Ambulatory Visit (INDEPENDENT_AMBULATORY_CARE_PROVIDER_SITE_OTHER): Payer: 59 | Admitting: Family Medicine

## 2011-07-12 ENCOUNTER — Ambulatory Visit: Payer: 59

## 2011-07-12 VITALS — BP 122/79 | HR 74 | Temp 98.2°F | Resp 18 | Ht 63.0 in | Wt 172.6 lb

## 2011-07-12 DIAGNOSIS — R9389 Abnormal findings on diagnostic imaging of other specified body structures: Secondary | ICD-10-CM

## 2011-07-12 DIAGNOSIS — R05 Cough: Secondary | ICD-10-CM

## 2011-07-12 DIAGNOSIS — J189 Pneumonia, unspecified organism: Secondary | ICD-10-CM

## 2011-07-12 DIAGNOSIS — R059 Cough, unspecified: Secondary | ICD-10-CM

## 2011-07-12 DIAGNOSIS — R918 Other nonspecific abnormal finding of lung field: Secondary | ICD-10-CM

## 2011-07-12 MED ORDER — ALBUTEROL SULFATE (2.5 MG/3ML) 0.083% IN NEBU
2.5000 mg | INHALATION_SOLUTION | Freq: Once | RESPIRATORY_TRACT | Status: AC
  Administered 2011-07-12: 2.5 mg via RESPIRATORY_TRACT

## 2011-07-12 MED ORDER — IPRATROPIUM BROMIDE 0.02 % IN SOLN
0.5000 mg | Freq: Once | RESPIRATORY_TRACT | Status: AC
  Administered 2011-07-12: 0.5 mg via RESPIRATORY_TRACT

## 2011-07-12 MED ORDER — MOMETASONE FURO-FORMOTEROL FUM 100-5 MCG/ACT IN AERO: 2.0000 | INHALATION_SPRAY | Freq: Two times a day (BID) | RESPIRATORY_TRACT | Status: DC

## 2011-07-12 NOTE — Patient Instructions (Signed)
Restart the [facility] and Singulair (you should have refills at the pharmacy, if not, please call me).

## 2011-07-12 NOTE — Progress Notes (Signed)
  Subjective:    Patient ID: Christina Hayes, female    DOB: November 04, 1950, 61 y.o.   MRN: [id number]  HPI Presents for re-evaluation of bibasilar infiltrates and recurrent cough.   Cough is better, but not resolved.  Still only minimally productive.   Has been referred by her PCP to [facility], appointment is in September.  No family history of lung disease.  No personal history of industrial-type exposures.  Non-smoker. No personal or family history of connective tissue disorder.  Review of Systems No fever, chills, GU symptoms.  Daily allergies, with clear rhinorrhea, treated with Claritin.  Occasionally heartburn, much improved from a history of daily indigestion, heartburn symptoms.    Objective:   Physical Exam Vital signs noted. Well-developed, well nourished WF who is awake, alert and oriented, in NAD. HEENT: [place]/AT, PERRL, EOMI.  Sclera and conjunctiva are clear.  EAC are patent, TMs are normal in appearance. Nasal mucosa is pink and moist. OP is clear. Neck: supple, non-tender, no lymphadenopathy, thyromegaly. Heart: RRR, no murmur Lungs: CTA, no wheezes, rales or rhonchi. Extremities: no cyanosis, clubbing or edema. Skin: warm and dry without rash.  [facility] reading (PRIMARY) by  Dr. Audria Nine.  Apparent persistent bibasilar infiltrates.  Spirometry reveals moderate obstruction initially, with FVC 68%predicted, FEV1 62% predicted, FEV1/FVC90% predicted, FEF25%-75% 43% predicted.  After albuterol + Atrovent neb, she showed probable restriction with 66/70/105/77% predicted.     Assessment & Plan:   1. Cough  DG Chest 2 View, albuterol (PROVENTIL) (2.5 MG/3ML) 0.083% nebulizer solution 2.5 mg, ipratropium (ATROVENT) nebulizer solution 0.5 mg, POCT SEDIMENTATION RATE, CBC with Differential, mometasone-formoterol (DULERA) 100-5 MCG/ACT AERO  2. Pneumonia  DG Chest 2 View; symptomatically improved, though not well.  Unlikely she is still infected.  3. Abnormal CXR  Await  over-read.  Consider CT chest.     Repeat spirometry in one month, ON Singulair and Dulera. Consider reflux as a cause, but given previous improvement on oral prednisone, that is less likely. PCP has ordered a CT sinuses, which she has not had, and should still be considered to evaluate for subacute sinusitis.  Discussed with Dr. Audria Nine.

## 2011-07-13 LAB — CBC WITH DIFFERENTIAL/PLATELET
Eosinophils Absolute: 0.8 10*3/uL — ABNORMAL HIGH (ref 0.0–0.7)
Eosinophils Relative: 9 % — ABNORMAL HIGH (ref 0–5)
HCT: 37.9 % (ref 36.0–46.0)
Hemoglobin: 12.6 g/dL (ref 12.0–15.0)
Lymphocytes Relative: 35 % (ref 12–46)
Lymphs Abs: 2.8 10*3/uL (ref 0.7–4.0)
MCH: 27.5 pg (ref 26.0–34.0)
MCV: 82.6 fL (ref 78.0–100.0)
Monocytes Absolute: 0.6 10*3/uL (ref 0.1–1.0)
Monocytes Relative: 7 % (ref 3–12)
Platelets: 367 10*3/uL (ref 150–400)
RBC: 4.59 MIL/uL (ref 3.87–5.11)
WBC: 8.1 10*3/uL (ref 4.0–10.5)

## 2011-07-17 ENCOUNTER — Other Ambulatory Visit: Payer: Self-pay | Admitting: Family Medicine

## 2011-07-17 DIAGNOSIS — R05 Cough: Secondary | ICD-10-CM

## 2011-07-17 MED ORDER — MOMETASONE FURO-FORMOTEROL FUM 100-5 MCG/ACT IN AERO: 2.0000 | INHALATION_SPRAY | Freq: Two times a day (BID) | RESPIRATORY_TRACT | Status: DC

## 2011-07-18 ENCOUNTER — Telehealth: Payer: Self-pay

## 2011-07-18 DIAGNOSIS — R05 Cough: Secondary | ICD-10-CM

## 2011-07-18 NOTE — Telephone Encounter (Signed)
Correct

## 2011-07-18 NOTE — Telephone Encounter (Signed)
Ryan, the 3 inhalers I rx'd yesterday should be enough for 3 months, right?

## 2011-07-18 NOTE — Telephone Encounter (Signed)
Please authorize a 3 month supply.

## 2011-07-18 NOTE — Telephone Encounter (Signed)
PT STATES THAT [facility] HAS BEEN TRYING TO REACH HER REGARDING HER DULERA, PT STATES THAT THEY ONLY FILL THAT RX FOR THE 3 MONTH SUPPLY INSTEAD OF OF THE 1 MONTH SUPPLY.

## 2011-08-02 ENCOUNTER — Other Ambulatory Visit: Payer: Self-pay

## 2011-08-02 DIAGNOSIS — R062 Wheezing: Secondary | ICD-10-CM

## 2011-08-02 DIAGNOSIS — J309 Allergic rhinitis, unspecified: Secondary | ICD-10-CM

## 2011-08-02 MED ORDER — MONTELUKAST SODIUM 10 MG PO TABS: 10.0000 mg | ORAL_TABLET | Freq: Every day | ORAL | Status: DC

## 2011-08-09 ENCOUNTER — Other Ambulatory Visit: Payer: Self-pay | Admitting: Physician Assistant

## 2011-08-09 ENCOUNTER — Encounter: Payer: Self-pay | Admitting: Physician Assistant

## 2011-08-09 ENCOUNTER — Ambulatory Visit (INDEPENDENT_AMBULATORY_CARE_PROVIDER_SITE_OTHER): Payer: 59 | Admitting: Physician Assistant

## 2011-08-09 VITALS — BP 106/72 | HR 80 | Temp 97.8°F | Resp 16 | Ht 62.0 in | Wt 172.0 lb

## 2011-08-09 DIAGNOSIS — R05 Cough: Secondary | ICD-10-CM

## 2011-08-09 DIAGNOSIS — R059 Cough, unspecified: Secondary | ICD-10-CM

## 2011-08-09 DIAGNOSIS — R609 Edema, unspecified: Secondary | ICD-10-CM

## 2011-08-09 DIAGNOSIS — R600 Localized edema: Secondary | ICD-10-CM

## 2011-08-09 DIAGNOSIS — H539 Unspecified visual disturbance: Secondary | ICD-10-CM

## 2011-08-09 LAB — COMPREHENSIVE METABOLIC PANEL
AST: 22 U/L (ref 0–37)
Albumin: 4.3 g/dL (ref 3.5–5.2)
Alkaline Phosphatase: 123 U/L — ABNORMAL HIGH (ref 39–117)
BUN: 14 mg/dL (ref 6–23)
Calcium: 9.4 mg/dL (ref 8.4–10.5)
Chloride: 102 mEq/L (ref 96–112)
Glucose, Bld: 105 mg/dL — ABNORMAL HIGH (ref 70–99)
Potassium: 4 mEq/L (ref 3.5–5.3)
Sodium: 139 mEq/L (ref 135–145)
Total Protein: 7.2 g/dL (ref 6.0–8.3)

## 2011-08-09 NOTE — Progress Notes (Signed)
Subjective:    Patient ID: Christina Hayes, female    DOB: [birth year], 61 y.o.   MRN: [id number]  HPI This 61 y.o. Female presents for re-evaluation of persistent cough.  Unchanged from last visit, despite daily use of Dulera.  Cough remains non-productive.    Since her last visit, her 63 year-old daughter has been diagnosed with aggressive breast cancer.    Review of Systems  Eyes: Positive for visual disturbance (vision is a little bit blurry a lot of the time x 3 weeks).  Cardiovascular: Positive for leg swelling (x 3 weeks-mild).  Psychiatric/Behavioral: Positive for dysphoric mood.   She has done some research on the internet and wonders if her symptoms may be medication related. ? Visual changes due to [facility]?  Diabetes? ? Cough caused by neurontin, diclofenac?   Past Medical History  Diagnosis Date  . HYPERLIPIDEMIA, MIXED 12/03/2006    PT STATES NOT TAKING MED AS PRESCRIBED  . VERTIGO, HX OF 12/03/2006  . MIGRAINES, HX OF 12/03/2006  . Allergic rhinitis, cause unspecified   . SUI (stress urinary incontinence, female)   . OSA on CPAP   . Head cold   . Cough   . Frequency of urination   . Urgency of urination   . Nocturia   . Acid reflux   . Complication of anesthesia SLOW TO WAKE   Prior to Admission medications   Medication Sig Start Date End Date Taking? Authorizing Provider  albuterol (PROVENTIL HFA;VENTOLIN HFA) 108 (90 BASE) MCG/ACT inhaler Inhale 2 puffs into the lungs every 4 (four) hours as needed for wheezing. 06/08/11 06/07/12 Yes Ryan M Dunn, PA-C  famotidine (PEPCID) 20 MG tablet Take 20 mg by mouth as needed.   Yes Historical Provider, MD  gabapentin (NEURONTIN) 600 MG tablet Take 600 mg by mouth every evening.    Yes Historical Provider, MD  Guaifenesin (MUCINEX MAXIMUM STRENGTH) 1200 MG TB12 Take 1 tablet (1,200 mg total) by mouth every 12 (twelve) hours as needed. 06/04/11  Yes Windsor Zirkelbach S Meghana Tullo, PA-C  IBUPROFEN PO Take by mouth as needed.   Yes Historical  Provider, MD  methocarbamol (ROBAXIN) 500 MG tablet Take 500 mg by mouth as needed.    Yes Historical Provider, MD  mometasone-formoterol (DULERA) 100-5 MCG/ACT AERO Inhale 2 puffs into the lungs 2 (two) times daily. 07/17/11  Yes Ryan M Dunn, PA-C  montelukast (SINGULAIR) 10 MG tablet Take 1 tablet (10 mg total) by mouth at bedtime. 08/02/11 08/01/12 Yes Heather M Marte, PA-C  acetaminophen (TYLENOL) 500 MG tablet Take 500 mg by mouth every 6 (six) hours as needed.    Historical Provider, MD  diclofenac (CATAFLAM) 50 MG tablet Take 50 mg by mouth as needed.    Historical Provider, MD  loratadine (CLARITIN) 10 MG tablet Take 10 mg by mouth every evening.     Historical Provider, MD    Allergies  Allergen Reactions  . Codeine Itching    History   Social History  . Marital Status: Married    Spouse Name: N/A    Number of Children: 1  . Years of Education: 12   Occupational History  . Retired    Social History Main Topics  . Smoking status: Never Smoker   . Smokeless tobacco: Never Used  . Alcohol Use: No  . Drug Use: No  . Sexually Active: Yes    Birth Control/ Protection: Surgical   Social History Narrative   HSGMarried 22-divorced, remarried '081 daughter '79Work: retired from Production designer, theatre/television/film  for [employer] cardsRegular exercise-no    Family History  Problem Relation Age of Onset  . Cancer Mother     Colon Cancer survivor  . Diabetes Mother   . Diabetes Father   . Hyperlipidemia Father   . Alzheimer's disease Father   . Cancer Maternal Grandmother     Breast Cancer  . Cancer Daughter 24    stage III-B breast CA       Objective:   Physical Exam Vital signs noted. Well-developed, well nourished WF who is awake, alert and oriented, in NAD. HEENT: [place]/AT, sclera and conjunctiva are clear.   Neck: supple, non-tender, no lymphadenopathy, thyromegaly. Heart: RRR, no murmur Lungs: CTA Extremities: no cyanosis, clubbing; trace pedal edema. Skin: warm and dry without  rash.  Spirometry: (improved relative to last visit) FVC 68% Predicted FEV1 73% FEV1/FVC 106% PEF 105% FEF 25-75 73%  Results for orders placed in visit on 08/09/11  GLUCOSE, POCT (MANUAL RESULT ENTRY)      Component Value Range   POC Glucose 107 (*) NON-FASTING 70 - 99 mg/dl  POCT GLYCOSYLATED HEMOGLOBIN (HGB A1C)      Component Value Range   Hemoglobin A1C 5.8         Assessment & Plan:   1. Cough  Continue Dulera and singulair and proceed with pulmonology evaluation next week.  2. Edema of both legs  POCT glucose (manual entry), POCT glycosylated hemoglobin (Hb A1C), Comprehensive metabolic panel  3. Vision disturbance  POCT glucose (manual entry), POCT glycosylated hemoglobin (Hb A1C)

## 2011-08-09 NOTE — Patient Instructions (Signed)
Proceed with the visit with pulmonology as planned! Schedule with your eye doctor. Be mindful of your sodium intake, as it can contribute to swelling in the legs.

## 2011-08-11 LAB — ALKALINE PHOSPHATASE, ISOENZYMES: Alk Phos Bone Fract: 27 U/L

## 2011-08-12 ENCOUNTER — Encounter: Payer: Self-pay | Admitting: Physician Assistant

## 2011-08-27 ENCOUNTER — Encounter: Payer: Self-pay | Admitting: Physician Assistant

## 2011-08-28 ENCOUNTER — Encounter: Payer: Self-pay | Admitting: Physician Assistant

## 2011-11-02 ENCOUNTER — Other Ambulatory Visit: Payer: Self-pay | Admitting: Physician Assistant

## 2011-11-20 ENCOUNTER — Other Ambulatory Visit: Payer: Self-pay | Admitting: Internal Medicine

## 2011-11-20 DIAGNOSIS — Z1231 Encounter for screening mammogram for malignant neoplasm of breast: Secondary | ICD-10-CM

## 2011-12-27 ENCOUNTER — Ambulatory Visit
Admission: RE | Admit: 2011-12-27 | Discharge: 2011-12-27 | Disposition: A | Discharge status: Home / Self Care | Payer: 59 | Source: Ambulatory Visit | Attending: Internal Medicine | Admitting: Internal Medicine

## 2011-12-27 DIAGNOSIS — Z1231 Encounter for screening mammogram for malignant neoplasm of breast: Secondary | ICD-10-CM

## 2012-01-06 ENCOUNTER — Other Ambulatory Visit: Payer: Self-pay | Admitting: Physician Assistant

## 2012-01-08 ENCOUNTER — Other Ambulatory Visit: Payer: Self-pay | Admitting: Physician Assistant

## 2012-02-12 ENCOUNTER — Ambulatory Visit: Payer: 59 | Admitting: Internal Medicine

## 2012-02-14 ENCOUNTER — Other Ambulatory Visit: Payer: Self-pay

## 2012-02-14 MED ORDER — MOMETASONE FURO-FORMOTEROL FUM 100-5 MCG/ACT IN AERO: 2.0000 | INHALATION_SPRAY | Freq: Two times a day (BID) | RESPIRATORY_TRACT | Status: DC

## 2012-02-14 NOTE — Telephone Encounter (Signed)
DULERA 100-5 MCG/ACT AERO Has an appointment on 14 Mar will run out prior to OV  Would like a supply to get her through to that appointment   [facility] on [address]   CBN:  [phone]

## 2012-02-14 NOTE — Telephone Encounter (Signed)
Pt has appt 04/04/12, will RF to get her through until then

## 2012-02-14 NOTE — Telephone Encounter (Signed)
Was told in October needed office visit. Sent in 3 patient does have an appt on March 14th, please advise, pended Rx for 1 inhaler.

## 2012-03-25 ENCOUNTER — Encounter: Payer: Self-pay | Admitting: Internal Medicine

## 2012-03-25 ENCOUNTER — Ambulatory Visit: Payer: 59 | Admitting: Internal Medicine

## 2012-03-25 VITALS — BP 108/72 | HR 85 | Temp 98.0°F | Resp 10 | Ht 62.0 in | Wt 177.6 lb

## 2012-03-25 MED ORDER — DICLOFENAC SODIUM 1 % TD GEL: 2.0000 g | Freq: Four times a day (QID) | TRANSDERMAL | Status: DC

## 2012-03-25 MED ORDER — ATORVASTATIN CALCIUM 20 MG PO TABS: 20.0000 mg | ORAL_TABLET | Freq: Every day | ORAL | Status: DC

## 2012-03-25 NOTE — Assessment & Plan Note (Signed)
Assessment: Pt with chronic non-productive cough for approximately 10 months improved with Singulair and Dulara.  Spirometry from outside clinic suggests some form of obstructive disease, it is unclear at this time if the pt truly has asthma as previously diagnosed.  Another possible etiology of pt's cough is bronchial irritation from chronic silent GERD (pt was previously on Pepcid but has not been recently taking this medication due to lack of symptoms).   Plan: - dc Singulair and Dulara and monitor for signs of worsening asthma (worsening cough, SOB, wheezing) if sx's present return to clinic - restart Pepcid - Consider formal pulmonary function testing and methacholine challenge - follow up in 4 weeks, sooner if sx's worsen

## 2012-03-25 NOTE — Assessment & Plan Note (Signed)
Assessment: pt with previously controlled hyperlipidemia now presents with uncontrolled hyperlipidemia secondary to discontinuing prescribed medication without physician input.  Plan:  - pt educated about the benefits/risks of statins and cautioned against the legitimacy of medical information from the internet - Start atorvastatin 20 mg daily - follow up in 4 weeks for repeat lipid panel, BMET, and LFTs

## 2012-03-25 NOTE — Patient Instructions (Addendum)
1. Asthma - not sure if there is an established diagnosis although the spirometry you had was suspicious. It is not clear that you need chronic medication Plan stop medications: dulera and singulair  Monitor your symptoms - especially looking for wheezing or inability to catch your breath  Treatment recommendations to be based on symptoms and repeat pulmonary function testing.  2. Lipids - way out there!!! Any risk of the medication is far outweighed by your risk for athersclorosis Plan  Atorvastatin 20 mg once a day  Lab - 4 weeks: lipid, liver function, Bmet  3. Sore thennar eminence left hand - palm - not a joint inflammation. Plan - voltaren gel applied 3-4 times a day   4. Health maintenance - we will check on the last colonoscopy. Will give you pneumonia vaccine today. Otherwise current and up to date. Please develop a regular exercise program - 30 min, 3 times a week with a target heart rate of 120+. Diet management: smart food choices, PORTION SIZE CONTROL, regular exercise. Goal - to loose 1-2 lbs.month. Target weight - 150 lbs.

## 2012-03-25 NOTE — Progress Notes (Signed)
Subjective:     Patient ID: Christina Hayes, female   DOB: 01/31/1950, 62 y.o.   MRN: [id number]  HPI Pt is scheduled for her annual physical but presents with multiple complaints including chronic cough, hyperlipidemia, and pain in her left thumb.    Cough - Pt states she was unable to schedule an appointment at [facility] and thus presented to an urgent care ([place]) last May with a dry hacking cough.  A CXR showed patchy infiltrate in the LLL and she was started on an antibiotic, a follow up CXR at the same urgent care showed no improvement spirometry was performed and a diagnosis of asthma was made, she was then started on Singulair, Dulara, and prn Albuterol.  The pt followed up with Dr. Susa Simmonds, a pulmonologist out of [facility], who read her spirometry results and confirmed the diagnosis of asthma suggesting that she continue her medications.  Today she reports that her cough is improved but still present and has not changed in quality (non-productive), she has not used her albuterol inhaler since it was prescribed, she denies SOB, CP, fever, and dyspnea on exertion.  She would like to reassess her respiratory medications due to expense.  Hyperlipidemia - Pt's lipids were previously controlled with Simvastatin.  After reading about side effects of statins on the internet, specifically those regarding liver function she discontinued her Simvastatin on her own.  Her neurologist drew labs at a routine visit that showed total cholesterol of 303, LDL 206, triglycerides 177, and HDL 62.  She understands these levels are high and would like to restart drug therapy.  Left thumb pain - pt has noticed over approximately the last month a pain over the thenar eminence of her left hand. She states the pain has a sharp stabbing quality and is noticed with mostly activity but can be present at rest  Review of Systems 10 pt review of systems negative except as otherwise stated in the HPI.    Objective:   Physical  Exam General: well developed, well nourished female sitting comfortably in chair in NAD HEENT: /AT, EOMI, PERRLA, oropharynx clean and non-erythematous CV: RRR, no m/r/g appreciated, 2+ radial and DP pulses bilaterally, no LE edema Pulm: Normal work of breathing, pt coughed consistently throughout interview and exam, inspiratory wheezing appreciated in LUL cleared with cough all other lung fields were clear to auscultation, tympanic to percussion in all fields bilaterally Abd: obese, soft, non tender, non distended, normal bowel sounds Extremities: left thenar eminence tender to palpation, pain elicited with extension of the left thumb, no pain with palpation of the left metacarpal joint Neuro: CNs grossly in tact, sensation to light touch grossly in tact in upper and lower extremities, 5/5 grip, elbow flexion/extension, hip flexion/extension, knee flexion/extensioon, dorsiflexion, and plantar flexion Psych: mood and affect appropriate for interaction    Assessment:     See problem list for complete assessment Cough, Hyperlipidemia, and Obesity  Thenar eminence pain: given physical exam pain is not likely arthritic.  Possibly muscular or neurogenic in origin but unclear at this time.    Plan:     See problem list for complete plan Cough, Hyperlipidemia, and Obesity  Thenar [place] Pain: - Voltaren gel applied directly to the sight of pain prn  PPx: - Pneumococcal vaccine - Retrieve last colonoscopy report to assess when next colonoscopy is due

## 2012-03-25 NOTE — Assessment & Plan Note (Addendum)
Assessment: ht 5' 2'' wt 177 BMI 32.3   Plan: - Target body weight 150 lbs; goal - to loose 1-2 lbs/month - regular exercise program consisting of 30 mins 3 days a week with target HR >=120 BPM - weight loss through diet with smart food choices and portion control - reassess weight at follow up in 4 weeks

## 2012-04-23 ENCOUNTER — Telehealth: Payer: Self-pay

## 2012-04-23 DIAGNOSIS — E782 Mixed hyperlipidemia: Secondary | ICD-10-CM

## 2012-04-23 NOTE — Telephone Encounter (Signed)
Lab called and stated pt was in for blood work. She thought cholesterol was to be check but there is no order. Please advise. thanks

## 2012-04-24 ENCOUNTER — Other Ambulatory Visit (INDEPENDENT_AMBULATORY_CARE_PROVIDER_SITE_OTHER): Payer: 59

## 2012-04-24 DIAGNOSIS — E782 Mixed hyperlipidemia: Secondary | ICD-10-CM

## 2012-04-24 LAB — COMPREHENSIVE METABOLIC PANEL
ALT: 32 U/L (ref 0–35)
AST: 32 U/L (ref 0–37)
Albumin: 3.6 g/dL (ref 3.5–5.2)
Alkaline Phosphatase: 120 U/L — ABNORMAL HIGH (ref 39–117)
BUN: 14 mg/dL (ref 6–23)
Potassium: 3.7 mEq/L (ref 3.5–5.1)
Sodium: 137 mEq/L (ref 135–145)
Total Protein: 7.2 g/dL (ref 6.0–8.3)

## 2012-04-24 LAB — LIPID PANEL
Cholesterol: 175 mg/dL (ref 0–200)
LDL Cholesterol: 97 mg/dL (ref 0–99)
Total CHOL/HDL Ratio: 3
Triglycerides: 87 mg/dL (ref 0.0–149.0)
VLDL: 17.4 mg/dL (ref 0.0–40.0)

## 2012-04-24 LAB — HEPATIC FUNCTION PANEL
Albumin: 3.6 g/dL (ref 3.5–5.2)
Bilirubin, Direct: 0.1 mg/dL (ref 0.0–0.3)
Total Protein: 7.2 g/dL (ref 6.0–8.3)

## 2012-04-28 ENCOUNTER — Ambulatory Visit (INDEPENDENT_AMBULATORY_CARE_PROVIDER_SITE_OTHER): Payer: 59 | Admitting: Internal Medicine

## 2012-04-28 ENCOUNTER — Telehealth: Payer: Self-pay | Admitting: Internal Medicine

## 2012-04-28 ENCOUNTER — Encounter: Payer: Self-pay | Admitting: Internal Medicine

## 2012-04-28 ENCOUNTER — Ambulatory Visit (INDEPENDENT_AMBULATORY_CARE_PROVIDER_SITE_OTHER)
Admission: RE | Admit: 2012-04-28 | Discharge: 2012-04-28 | Disposition: A | Discharge status: Home / Self Care | Payer: 59 | Source: Ambulatory Visit | Attending: Internal Medicine | Admitting: Internal Medicine

## 2012-04-28 VITALS — BP 128/78 | HR 118 | Temp 99.3°F | Ht 62.0 in | Wt 175.6 lb

## 2012-04-28 DIAGNOSIS — J45901 Unspecified asthma with (acute) exacerbation: Secondary | ICD-10-CM

## 2012-04-28 DIAGNOSIS — J209 Acute bronchitis, unspecified: Secondary | ICD-10-CM

## 2012-04-28 MED ORDER — METHYLPREDNISOLONE ACETATE 80 MG/ML IJ SUSP
80.0000 mg | Freq: Once | INTRAMUSCULAR | Status: AC
  Administered 2012-04-28: 80 mg via INTRAMUSCULAR

## 2012-04-28 MED ORDER — METHYLPREDNISOLONE ACETATE 40 MG/ML IJ SUSP
40.0000 mg | Freq: Once | INTRAMUSCULAR | Status: AC
  Administered 2012-04-28: 40 mg via INTRAMUSCULAR

## 2012-04-28 MED ORDER — AZITHROMYCIN 250 MG PO TABS: ORAL_TABLET | ORAL | Status: DC

## 2012-04-28 NOTE — Patient Instructions (Signed)

## 2012-04-28 NOTE — Progress Notes (Signed)
HPI  Pt presents to the clinic today with c/o cough and wheezing for 4 days. At her last visit with Dr. Debby Bud, her Singulair and [facility] inhaler were d/c'd due to cost of patient. Pt was doing ok until 1 week ago when she noticed the coughing a wheezing. It has progressively gotten worse, and she has now started running fevers. She has not been producing any sputum. She did restart her inhaler last night in addition to using her albuterol inhaler every 4-6 hours. She has been diagnosed with asthma. She does not smoke. She has had sick contacts. Review of Systems      Past Medical History  Diagnosis Date  . HYPERLIPIDEMIA, MIXED 12/03/2006    PT STATES NOT TAKING MED AS PRESCRIBED  . VERTIGO, HX OF 12/03/2006  . MIGRAINES, HX OF 12/03/2006  . Allergic rhinitis, cause unspecified   . SUI (stress urinary incontinence, female)   . OSA on CPAP   . Head cold   . Cough   . Frequency of urination   . Urgency of urination   . Nocturia   . Acid reflux   . Complication of anesthesia SLOW TO WAKE    Family History  Problem Relation Age of Onset  . Cancer Mother     Colon Cancer survivor  . Diabetes Mother   . Diabetes Father   . Hyperlipidemia Father   . Alzheimer's disease Father   . Cancer Maternal Grandmother     Breast Cancer  . Cancer Daughter 16    stage III-B breast CA    History   Social History  . Marital Status: Married    Spouse Name: N/A    Number of Children: 1  . Years of Education: 12   Occupational History  . Retired    Social History Main Topics  . Smoking status: Never Smoker   . Smokeless tobacco: Never Used  . Alcohol Use: No  . Drug Use: No  . Sexually Active: Yes -- Female partner(s)    Birth Control/ Protection: Surgical   Other Topics Concern  . 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 Codeine Itching     Constitutional: Positive headache, fatigue and fever. Denies abrupt weight changes.  HEENT:  Positive sore throat. Denies eye redness, eye pain, pressure behind the eyes, facial pain, nasal congestion, ear pain, ringing in the ears, wax buildup, runny nose or bloody nose. Respiratory: Positive cough and wheeze. Denies difficulty breathing or shortness of breath.  Cardiovascular: Denies chest pain, chest tightness, palpitations or swelling in the hands or feet.   No other specific complaints in a complete review of systems (except as listed in HPI above).  Objective:   BP 128/78  Pulse 118  Temp(Src) 99.3 F (37.4 C) (Oral)  Ht 5\' 2"  (1.575 m)  Wt 175 lb 9.6 oz (79.652 kg)  BMI 32.11 kg/m2  SpO2 95% Wt Readings from Last 3 Encounters:  04/28/12 175 lb 9.6 oz (79.652 kg)  03/25/12 177 lb 9.6 oz (80.559 kg)  08/09/11 172 lb (78.019 kg)     General: Appears her stated age, well developed, well nourished in NAD. HEENT: Head: normal shape and size; Eyes: sclera white, no icterus, conjunctiva pink, PERRLA and EOMs intact; Ears: Tm's  gray and intact, normal light reflex; Nose: mucosa pink and moist, septum midline; Throat/Mouth: + PND. Teeth present, mucosa erythematous and moist, no exudate noted, no lesions or ulcerations noted.  Neck: Mild cervical lymphadenopathy. Neck supple, trachea midline. No massses, lumps or thyromegaly present.  Cardiovascular: Normal rate and rhythm. S1,S2 noted.  No murmur, rubs or gallops noted. No JVD or BLE edema. No carotid bruits noted. Pulmonary/Chest: Normal effort, coarse rhonchi and bilateral inspiratory and expiratory wheezing noted. Mild respiratory distress.     Assessment & Plan:   Acute bronchitis, new onset with additional workup required:  Get some rest and drink plenty of water Do salt water gargles for the sore throat eRx for Azithromax x 5 days Nebulizer treatment in office  today 120 mg Depo IM today Chest xray to r/o pneumonia  RTC as needed or if symptoms persist.

## 2012-04-28 NOTE — Telephone Encounter (Signed)
Patient Information:  Caller Name: Philisha  Phone: [phone]  Patient: Christina Hayes, Christina Hayes  Gender: Female  DOB: 11-16-1950  Age: 62 Years  PCP: Illene Regulus (Adults only)  Office Follow Up:  Does the office need to follow up with this patient?: No  Instructions For The Office: N/A  RN Note:  Frequent hard coughing with intermittent wheezing.  MD stopped Elwin Sleight and Singular at 03/25/12 office visit per MD orders.  Not using Albuterol.  Resumed Singulair and Dulera 04/27/12 with slight improvement.  Advised to begin using Albuterol MDI 2 puffs every 4-6 hrs prn hard cough or wheezing.   Symptoms  Reason For Call & Symptoms: Productive cough and congestion  Reviewed Health History In EMR: Yes  Reviewed Medications In EMR: Yes  Reviewed Allergies In EMR: Yes  Reviewed Surgeries / Procedures: Yes  Date of Onset of Symptoms: 04/24/2012  Treatments Tried: vapor rub steam, showers/baths, otc cough meds  Treatments Tried Worked: No  Guideline(s) Used:  Cough  Asthma Attack  Disposition Per Guideline:   See Today in Office  Reason For Disposition Reached:   Patient wants to be seen  Advice Given:  Quick-Relief Asthma Medicine:   Start your quick-relief medicine (e.g., albuterol, salbutamol) at the first sign of any coughing or shortness of breath (don't wait for wheezing). Use your inhaler (2 puffs each time) or nebulizer every 4 hours. Continue the quick-relief medicine until you have not wheezed or coughed for 48 hours.  The best "cough medicine" for an adult with asthma is always the asthma medicine (Note: Don't use cough suppressants, but cough drops may help a tickly cough).  Drinking Liquids:  Try to drink normal amount of liquids (e.g., water). Being adequately hydrated makes it easier to cough up the sticky lung mucus.  Humidifier:   If the air is dry, use a cool mist humidifier to prevent drying of the upper airway.  Hay Fever  : If you have nasal symptoms from hay fever, it's  OK to take antihistamines (Reasons: poor control of allergic rhinitis makes asthma worse whereas antihistamines don't make asthma worse).  Expected Course:  If treatment is started early, most asthma attacks are quickly brought under control. All wheezing should be gone by 5 days.  Call Back If:  Inhaled asthma medicine (nebulizer or inhaler) is needed more often than every 4 hours  Wheezing has not completely cleared after 5 days  You become worse.  Patient Will Follow Care Advice:  YES  Appointment Scheduled:  04/28/2012 10:00:00 Appointment Scheduled Provider:  Nicki Reaper

## 2012-04-29 ENCOUNTER — Ambulatory Visit: Payer: 59 | Admitting: Internal Medicine

## 2012-05-21 ENCOUNTER — Encounter: Payer: Self-pay | Admitting: Internal Medicine

## 2012-05-21 ENCOUNTER — Ambulatory Visit (INDEPENDENT_AMBULATORY_CARE_PROVIDER_SITE_OTHER): Payer: 59 | Admitting: Internal Medicine

## 2012-05-21 VITALS — BP 110/72 | HR 71 | Temp 98.0°F | Resp 18 | Wt 171.2 lb

## 2012-05-21 DIAGNOSIS — E782 Mixed hyperlipidemia: Secondary | ICD-10-CM

## 2012-05-21 DIAGNOSIS — J45909 Unspecified asthma, uncomplicated: Secondary | ICD-10-CM

## 2012-05-21 MED ORDER — MONTELUKAST SODIUM 10 MG PO TABS: 10.0000 mg | ORAL_TABLET | Freq: Every day | ORAL | Status: DC

## 2012-05-21 NOTE — Progress Notes (Signed)
Subjective:    Patient ID: Christina Hayes, female    DOB: 03-26-1950, 62 y.o.   MRN: [id number]  HPI  62 y.o. WF presents to the office today for follow-up of chronic medical conditions  Hyperlipidemia:  Patient was taking simvastatin for hyperlipidemia and went off the medication for approximately a year.  Was started on atorvastatin 03/25/12.  States that she is doing well.  Asthma:  Patient was taking Dulera and Singulair which were prescribed for asthma by [facility].  Patient also saw Dr. Susa Simmonds with [facility] who aggreed with the course of treatment with Singulair and Dulera.  Dr. Debby Bud discontinued these medications 03/25/12 as a trial, during which the patient felt significantly worse.  Patient began taking dulera and singulair after visit 04/28/12 which she developed acute bronchitis.  Patient is requesting that she stay on this medication, and that we can find a more cost effective medication for her.        Review of Systems  Respiratory: Positive for cough. Negative for chest tightness and shortness of breath.   Cardiovascular: Negative for chest pain.  Musculoskeletal: Negative for myalgias.  Allergic/Immunologic: Positive for environmental allergies.  All other systems reviewed and are negative.       Objective:   Physical Exam  Vitals reviewed. Constitutional: She is oriented to person, place, and time. She appears well-developed and well-nourished. No distress.  HENT:  Head: Normocephalic and atraumatic.  Eyes: Conjunctivae are normal. Right eye exhibits no discharge. Left eye exhibits no discharge. No scleral icterus.  Neck: Normal range of motion. Neck supple. Carotid bruit is not present.  Cardiovascular: Normal rate, regular rhythm and normal heart sounds.  Exam reveals no gallop and no friction rub.   No murmur heard. Pulmonary/Chest: Effort normal and breath sounds normal. No respiratory distress. She has no wheezes. She has no rales. She exhibits  no tenderness.  Musculoskeletal: Normal range of motion.  Neurological: She is alert and oriented to person, place, and time.  Skin: Skin is warm and dry. She is not diaphoretic.  Psychiatric: She has a normal mood and affect. Her behavior is normal.    Lab Results  Component Value Date   WBC 8.1 07/12/2011   HGB 12.6 07/12/2011   HCT 37.9 07/12/2011   PLT 367 07/12/2011   GLUCOSE 87 04/24/2012   CHOL 175 04/24/2012   TRIG 87.0 04/24/2012   HDL 61.00 04/24/2012   LDLDIRECT 231.2 03/28/2010   LDLCALC 97 04/24/2012   ALT 32 04/24/2012   ALT 32 04/24/2012   AST 32 04/24/2012   AST 32 04/24/2012   NA 137 04/24/2012   K 3.7 04/24/2012   CL 104 04/24/2012   CREATININE 1.0 04/24/2012   BUN 14 04/24/2012   CO2 29 04/24/2012   TSH 1.41 11/14/2010   HGBA1C 5.8 08/09/2011        Assessment & Plan:  62 y.o WF presents to the office today for review of chronic medical conditions  Hyperlipidemia.  Lipid panel drawn 04/24/2012 shows total cholesterol of 175, LDL 97, HDL 61.0.  Will continue to use atorvastatin at current dose.  Asthma:  Formularies were compared and it was requested that patient's local pharmacist call the office and tell us which asthma medication will be most cost effective for the patient.  Singulair refill was ordered.    I have personally reviewed this case with PA student. I also personally examined this patient. I agree with history and findings as documented above.  I reviewed, discussed and approve of the assessment and plan as listed above. Rene Paci, MD

## 2012-05-21 NOTE — Patient Instructions (Signed)
Please schedule followup in 3-4 months, call sooner if problems. We have reviewed your prior records including labs and tests today We will be happy to change Dulera to a formulary medication for asthma, please have your local pharmacist to call us regarding options for treatment Resume Singulair and continue cholesterol treatment -  Your prescription(s) have been submitted to your pharmacy. Please take as directed and contact our office if you believe you are having problem(s) with the medication(s). Followup with Dr. Debby Bud to in 6 months to continue review, call sooner if problems

## 2012-05-21 NOTE — Assessment & Plan Note (Signed)
Much improved on statin therapy Followup lipids reviewed Continue same

## 2012-05-21 NOTE — Progress Notes (Signed)
  Subjective:    Patient ID: Christina Hayes, female    DOB: April 22, 1950, 62 y.o.   MRN: [id number]  HPI  Here for followup -reviewed interval events Also see PA student note from OV today  Past Medical History  Diagnosis Date  . HYPERLIPIDEMIA, MIXED 12/03/2006    PT STATES NOT TAKING MED AS PRESCRIBED  . VERTIGO, HX OF 12/03/2006  . MIGRAINES, HX OF 12/03/2006  . Allergic rhinitis, cause unspecified   . SUI (stress urinary incontinence, female)   . OSA on CPAP   . Head cold   . Cough   . Frequency of urination   . Urgency of urination   . Nocturia   . Acid reflux   . Complication of anesthesia SLOW TO WAKE    Review of Systems  Constitutional: Negative for fever, fatigue and unexpected weight change.  Respiratory: Positive for cough. Negative for shortness of breath and wheezing.   Cardiovascular: Negative for chest pain and palpitations.  Allergic/Immunologic: Positive for environmental allergies. Negative for immunocompromised state.       Objective:   Physical Exam BP 110/72  Pulse 71  Temp(Src) 98 F (36.7 C)  Resp 18  Wt 171 lb 3.2 oz (77.656 kg)  BMI 31.31 kg/m2  SpO2 97% Wt Readings from Last 3 Encounters:  05/21/12 171 lb 3.2 oz (77.656 kg)  04/28/12 175 lb 9.6 oz (79.652 kg)  03/25/12 177 lb 9.6 oz (80.559 kg)   Constitutional: She appears well-developed and well-nourished. No distress.  Neck: Normal range of motion. Neck supple. No JVD present. No thyromegaly present.  Cardiovascular: Normal rate, regular rhythm and normal heart sounds.  No murmur heard. No BLE edema. Pulmonary/Chest: Effort normal and breath sounds normal. No respiratory distress. She has no wheezes.  Psychiatric: She has a normal mood and affect. Her behavior is normal. Judgment and thought content normal.   Lab Results  Component Value Date   WBC 8.1 07/12/2011   HGB 12.6 07/12/2011   HCT 37.9 07/12/2011   PLT 367 07/12/2011   GLUCOSE 87 04/24/2012   CHOL 175 04/24/2012   TRIG 87.0  04/24/2012   HDL 61.00 04/24/2012   LDLDIRECT 231.2 03/28/2010   LDLCALC 97 04/24/2012   ALT 32 04/24/2012   ALT 32 04/24/2012   AST 32 04/24/2012   AST 32 04/24/2012   NA 137 04/24/2012   K 3.7 04/24/2012   CL 104 04/24/2012   CREATININE 1.0 04/24/2012   BUN 14 04/24/2012   CO2 29 04/24/2012   TSH 1.41 11/14/2010   HGBA1C 5.8 08/09/2011      Assessment & Plan:   See problem list. Medications and labs reviewed today.

## 2012-05-21 NOTE — Assessment & Plan Note (Signed)
S/p eval at [facility] - symptoms consistent with asthma Worse without dulera and singular x 1 month trial 03/2012 - advised to resume same by pulm Request formulary alternative to [facility] - patient will have her local pharmacist contact us regarding this option Will resume and refill Singulair

## 2012-05-22 ENCOUNTER — Encounter: Payer: Self-pay | Admitting: Internal Medicine

## 2012-05-22 MED ORDER — BECLOMETHASONE DIPROPIONATE 80 MCG/ACT IN AERS: 1.0000 | INHALATION_SPRAY | Freq: Two times a day (BID) | RESPIRATORY_TRACT | Status: DC

## 2012-05-23 NOTE — Telephone Encounter (Signed)
call [phone] to start a  coverage review for a deductible exception  As per pt message, please call as requested re: coverage for Qvar thanks

## 2012-05-28 ENCOUNTER — Encounter: Payer: Self-pay | Admitting: Internal Medicine

## 2012-05-28 MED ORDER — ATORVASTATIN CALCIUM 20 MG PO TABS: 20.0000 mg | ORAL_TABLET | Freq: Every day | ORAL | Status: DC

## 2012-05-28 MED ORDER — MONTELUKAST SODIUM 10 MG PO TABS: 10.0000 mg | ORAL_TABLET | Freq: Every day | ORAL | Status: DC

## 2012-11-14 ENCOUNTER — Other Ambulatory Visit: Payer: Self-pay

## 2012-11-14 DIAGNOSIS — Z1231 Encounter for screening mammogram for malignant neoplasm of breast: Secondary | ICD-10-CM

## 2012-11-27 ENCOUNTER — Other Ambulatory Visit: Payer: Self-pay

## 2012-12-29 ENCOUNTER — Ambulatory Visit
Admission: RE | Admit: 2012-12-29 | Discharge: 2012-12-29 | Disposition: A | Discharge status: Home / Self Care | Payer: 59 | Source: Ambulatory Visit

## 2012-12-29 DIAGNOSIS — Z1231 Encounter for screening mammogram for malignant neoplasm of breast: Secondary | ICD-10-CM

## 2013-02-02 ENCOUNTER — Telehealth: Payer: Self-pay | Admitting: Internal Medicine

## 2013-02-02 DIAGNOSIS — R053 Chronic cough: Secondary | ICD-10-CM

## 2013-02-02 DIAGNOSIS — R05 Cough: Secondary | ICD-10-CM

## 2013-02-02 NOTE — Telephone Encounter (Signed)
Referral to pulmonary sent to [facility]

## 2013-02-02 NOTE — Telephone Encounter (Signed)
Pt is requesting a referral to Pulmonology for a chronic cough.  This has been discussed at earlier appoints.

## 2013-02-03 NOTE — Telephone Encounter (Signed)
Pt is aware.  

## 2013-02-05 ENCOUNTER — Ambulatory Visit: Payer: 59 | Admitting: Internal Medicine

## 2013-02-06 ENCOUNTER — Ambulatory Visit (INDEPENDENT_AMBULATORY_CARE_PROVIDER_SITE_OTHER): Payer: 59 | Admitting: Internal Medicine

## 2013-02-06 ENCOUNTER — Encounter: Payer: Self-pay | Admitting: Internal Medicine

## 2013-02-06 ENCOUNTER — Ambulatory Visit (INDEPENDENT_AMBULATORY_CARE_PROVIDER_SITE_OTHER)
Admission: RE | Admit: 2013-02-06 | Discharge: 2013-02-06 | Disposition: A | Discharge status: Home / Self Care | Payer: 59 | Source: Ambulatory Visit | Attending: Internal Medicine | Admitting: Internal Medicine

## 2013-02-06 VITALS — BP 120/60 | HR 112 | Temp 101.2°F | Ht 62.0 in | Wt 180.0 lb

## 2013-02-06 DIAGNOSIS — R059 Cough, unspecified: Secondary | ICD-10-CM

## 2013-02-06 DIAGNOSIS — R05 Cough: Secondary | ICD-10-CM

## 2013-02-06 DIAGNOSIS — J45991 Cough variant asthma: Secondary | ICD-10-CM | POA: Insufficient documentation

## 2013-02-06 DIAGNOSIS — J069 Acute upper respiratory infection, unspecified: Secondary | ICD-10-CM

## 2013-02-06 MED ORDER — HYDROCODONE-ACETAMINOPHEN 5-300 MG PO TABS: ORAL_TABLET | ORAL | Status: DC

## 2013-02-06 MED ORDER — PREDNISONE 10 MG PO TABS: ORAL_TABLET | ORAL | Status: DC

## 2013-02-06 MED ORDER — MONTELUKAST SODIUM 10 MG PO TABS: ORAL_TABLET | ORAL | Status: DC

## 2013-02-06 MED ORDER — PANTOPRAZOLE SODIUM 40 MG PO TBEC: 40.0000 mg | DELAYED_RELEASE_TABLET | Freq: Every day | ORAL | Status: DC

## 2013-02-06 MED ORDER — AZITHROMYCIN 250 MG PO TABS: ORAL_TABLET | ORAL | Status: DC

## 2013-02-06 NOTE — Progress Notes (Signed)
Subjective:    Patient ID: Christina Hayes, female    DOB: Apr 18, 1950   MRN: [id number]  HPI  84 yowf never smoker with some sneezing as child and tendency to croupy cough outgrew by teenage years until around 2010 with variable cough last for weeks to months recurrence since Aug 2014 referred to pulmonary clinic 02/06/2013 by Dr Linda Hedges.  02/06/2013 1st  Pulmonary office visit/ Tyree Vandruff cc daily cough x 5 months recurred on no maint rx,  No pattern day vs night, usually dry but changed x 3 days prior to OV  Much more productive of white mucus assoc with runny nose "like a cold" with low grade fever but no sob unless coughing.    prednsione really helps.  Always takes  pepcid 20 mg at hs  - proaire helps breathing but not coughing "unless it's really bad"   08/14/11 eval by Lucia Gaskins, improved on dulera and declined MCT - pt not sure whether ever had breakthrough symptoms while compliant with dulera but is sure she was on nothing but the hs pepcid when present cough flared      Kouffman Reflux v Neurogenic Cough Differentiator Reflux Comments  Do you awaken from a sound sleep coughing violently?                            With trouble breathing? Yes   Do you have choking episodes when you cannot  Get enough air, gasping for air ?              Yes   Do you usually cough when you lie down into  The bed, or when you just lie down to rest ?                          Yes   Do you usually cough after meals or eating?         Not sure   Do you cough when (or after) you bend over?    No    GERD SCORE     Kouffman Reflux v Neurogenic Cough Differentiator Neurogenic   Do you more-or-less cough all day long? yes   Does change of temperature make you cough? sometime   Does laughing or chuckling cause you to cough? yes   Do fumes (perfume, automobile fumes, burned  Toast, etc.,) cause you to cough ?      no   Does speaking, singing, or talking on the phone cause you to cough   ?               yes    Neurogenic/Airway score       No obvious other patterns in  day to day or daytime variabilty or assoc  p or chest tightness, subjective wheeze overt sinus or hb symptoms. No unusual exp hx or h/o childhood pna/ asthma or knowledge of premature birth.    Also denies any obvious fluctuation of symptoms with weather or environmental changes or other aggravating or alleviating factors except as outlined above   Current Medications, Allergies, Complete Past Medical History, Past Surgical History, Family History, and Social History were reviewed in [employer] record.  ROS  The following are not active complaints unless bolded sore throat, dysphagia, dental problems, itching, sneezing,  nasal congestion or excess/ purulent secretions, ear ache,   fever, chills, sweats, unintended wt loss, pleuritic or exertional cp,  hemoptysis,  orthopnea pnd or leg swelling, presyncope, palpitations, heartburn, abdominal pain, anorexia, nausea, vomiting, diarrhea  or change in bowel or urinary habits, change in stools or urine, dysuria,hematuria,  rash, arthralgias, visual complaints, headache, numbness weakness or ataxia or problems with walking or coordination,  change in mood/affect or memory.        Review of Systems  Constitutional: Negative for fever, chills and unexpected weight change.  HENT: Positive for sneezing and trouble swallowing. Negative for congestion, dental problem, ear pain, nosebleeds, postnasal drip, rhinorrhea, sinus pressure, sore throat and voice change.   Eyes: Negative for visual disturbance.  Respiratory: Positive for cough. Negative for choking and shortness of breath.   Cardiovascular: Negative for chest pain and leg swelling.  Gastrointestinal: Negative for vomiting, abdominal pain and diarrhea.  Genitourinary: Negative for difficulty urinating.       Heart burn Indigestion  Musculoskeletal: Negative for arthralgias.  Skin: Negative for rash.   Neurological: Positive for headaches. Negative for tremors and syncope.  Hematological: Does not bruise/bleed easily.       Objective:   Physical Exam   amb wf evasive with answers incessant harsh barking cough Wt Readings from Last 3 Encounters:  02/06/13 180 lb (81.647 kg)  05/21/12 171 lb 3.2 oz (77.656 kg)  04/28/12 175 lb 9.6 oz (79.652 kg)      HEENT: nl dentition, turbinates, and orophanx. Nl external ear canals without cough reflex   NECK :  without JVD/Nodes/TM/ nl carotid upstrokes bilaterally   LUNGS: no acc muscle use, mostly pseudowheeze   CV:  RRR  no s3 or murmur or increase in P2, no edema   ABD:  soft and nontender with nl excursion in the supine position. No bruits or organomegaly, bowel sounds nl  MS:  warm without deformities, calf tenderness, cyanosis or clubbing  SKIN: warm and dry without lesions    NEURO:  alert, approp, no deficits      CXR  02/06/2013 :  Similar findings most suggestive of airways disease / bronchitis, though note, underlying atypical infection is not excluded. These findings appear similar to most recent comparison examination performed 04/28/2012 and as such, a follow-up chest radiograph in 4-6 weeks after the resolution of symptoms is recommended to ensure complete resolution.        Assessment & Plan:

## 2013-02-06 NOTE — Patient Instructions (Addendum)
Pantoprazole (protonix) 40 mg   Take 30-60 min before first meal of the day and continue  Pepcid 20 mg one bedtime and chlortrimeton 4 mg  until return to office - this is the best way to tell whether stomach acid is contributing to your problem.    Prednisone 10 mg take  4 each am x 2 days,   2 each am x 2 days,  1 each am x 2 days and stop   Singulair 10 mg one in evening.  Continue delsym 2 tsp every 12 hours and if can't stop coughing supplement with vicodin 1 -2 every 4 hours if needed  Zpak called in since low grade fever  GERD (REFLUX)  is an extremely common cause of respiratory symptoms, many times with no significant heartburn at all.    It can be treated with medication, but also with lifestyle changes including avoidance of late meals, excessive alcohol, smoking cessation, and avoid fatty foods, chocolate, peppermint, colas, red wine, and acidic juices such as orange juice.  NO MINT OR MENTHOL PRODUCTS SO NO COUGH DROPS  USE SUGARLESS CANDY INSTEAD (jolley ranchers or Stover's)  NO OIL BASED VITAMINS - use powdered substitutes.    Please schedule a follow up office visit in 2 weeks, sooner if needed must bring all medication including over the counters

## 2013-02-06 NOTE — Progress Notes (Signed)
Quick Note:  Spoke with pt and notified of results per Dr. Wert. Pt verbalized understanding and denied any questions.  ______ 

## 2013-02-06 NOTE — Assessment & Plan Note (Signed)
Low grad fever/ coryzal illness superimposed on chronic cough so rx with cough suppression/ zpak

## 2013-02-06 NOTE — Assessment & Plan Note (Signed)
The most common causes of chronic cough in immunocompetent adults include the following: upper airway cough syndrome (UACS), previously referred to as postnasal drip syndrome (PNDS), which is caused by variety of rhinosinus conditions; (2) asthma; (3) GERD; (4) chronic bronchitis from cigarette smoking or other inhaled environmental irritants; (5) nonasthmatic eosinophilic bronchitis; and (6) bronchiectasis.   These conditions, singly or in combination, have accounted for up to 94% of the causes of chronic cough in prospective studies.   Other conditions have constituted no >6% of the causes in prospective studies These have included bronchogenic carcinoma, chronic interstitial pneumonia, sarcoidosis, left ventricular failure, ACEI-induced cough, and aspiration from a condition associated with pharyngeal dysfunction.    Chronic cough is often simultaneously caused by more than one condition. A single cause has been found from 38 to 82% of the time, multiple causes from 18 to 62%. Multiply caused cough has been the result of three diseases up to 42% of the time.       Mostly likely this is cough variant asthma and the issue is what meds need to be maintained to control it.  rec restart just he singulair and rx short term with max empirical GERD RX plus only short course prednisone then regroup in 2 weeks  Discussed with pt The standardized cough guidelines published in Chest by Lissa Morales in 2006 are still the best available and consist of a multiple step process (up to 12!) , not a single office visit,  and are intended  to address this problem logically,  with an alogrithm dependent on response to empiric treatment at  each progressive step  to determine a specific diagnosis with  minimal addtional testing needed. Therefore if adherence is an issue or can't be accurately verified,  it's very unlikely the standard evaluation and treatment will be successful here.    Furthermore, response to  therapy (other than acute cough suppression, which should only be used short term with avoidance of narcotic containing cough syrups if possible), can be a gradual process for which the patient may perceive immediate benefit.  Unlike going to an eye doctor where the best perscription is almost always the first one and is immediately effective, this is almost never the case in the management of chronic cough syndromes. Therefore the patient needs to commit up front to consistently adhere to recommendations  for up to 6 weeks of therapy directed at the likely underlying problem(s) before the response can be reasonably evaluated.

## 2013-02-12 ENCOUNTER — Encounter: Payer: Self-pay | Admitting: Internal Medicine

## 2013-02-12 ENCOUNTER — Ambulatory Visit (INDEPENDENT_AMBULATORY_CARE_PROVIDER_SITE_OTHER): Payer: 59 | Admitting: Adult Health

## 2013-02-12 ENCOUNTER — Encounter: Payer: Self-pay | Admitting: Adult Health

## 2013-02-12 VITALS — BP 106/74 | HR 65 | Temp 98.6°F | Ht 62.0 in | Wt 177.0 lb

## 2013-02-12 DIAGNOSIS — J45991 Cough variant asthma: Secondary | ICD-10-CM

## 2013-02-12 MED ORDER — LEVALBUTEROL HCL 0.63 MG/3ML IN NEBU
0.6300 mg | INHALATION_SOLUTION | Freq: Once | RESPIRATORY_TRACT | Status: AC
  Administered 2013-02-12: 0.63 mg via RESPIRATORY_TRACT

## 2013-02-12 MED ORDER — PREDNISONE 10 MG PO TABS: ORAL_TABLET | ORAL | Status: DC

## 2013-02-12 NOTE — Patient Instructions (Addendum)
Prednisone taper.  Begin Symbicort 160/4.82mcg 2 puffs Twice daily  .  Continue on Pantoprazole (protonix) 40 mg   Take 30-60 min before first meal of the day and continue  Pepcid 20 mg one bedtime and chlortrimeton 4 mg  until return to office - this is the best way to tell whether stomach acid is contributing to your problem.   Continue delsym 2 tsp every 12 hours and if can't stop coughing supplement with vicodin 1 -2 every 4 hours if needed Add Chlorphenaramine 4mg  2 tabs At bedtime  , may take 1 every 4hr As needed  Drainage.   It can be treated with medication, but also with lifestyle changes including avoidance of late meals, excessive alcohol, smoking cessation, and avoid fatty foods, chocolate, peppermint, colas, red wine, and acidic juices such as orange juice.  NO MINT OR MENTHOL PRODUCTS SO NO COUGH DROPS  USE SUGARLESS CANDY INSTEAD (jolley ranchers or Stover's)  NO OIL BASED VITAMINS - use powdered substitutes. We are setting you up for a CT sinus and chest  Follow up Christina Hayes in 1 weeks and As needed    Please contact office for sooner follow up if symptoms do not improve or worsen or seek emergency care

## 2013-02-12 NOTE — Assessment & Plan Note (Signed)
Slow to resolve flare with cyclical cough  Will need PFT once cough is improving  xopenex neb x 1  Check ct sinus/chest    Plan  Prednisone taper.  Begin Symbicort 160/4.60mcg 2 puffs Twice daily  .  Continue on Pantoprazole (protonix) 40 mg   Take 30-60 min before first meal of the day and continue  Pepcid 20 mg one bedtime and chlortrimeton 4 mg  until return to office - this is the best way to tell whether stomach acid is contributing to your problem.   Continue delsym 2 tsp every 12 hours and if can't stop coughing supplement with vicodin 1 -2 every 4 hours if needed Add Chlorphenaramine 4mg  2 tabs At bedtime  , may take 1 every 4hr As needed  Drainage.   It can be treated with medication, but also with lifestyle changes including avoidance of late meals, excessive alcohol, smoking cessation, and avoid fatty foods, chocolate, peppermint, colas, red wine, and acidic juices such as orange juice.  NO MINT OR MENTHOL PRODUCTS SO NO COUGH DROPS  USE SUGARLESS CANDY INSTEAD (jolley ranchers or Stover's)  NO OIL BASED VITAMINS - use powdered substitutes. We are setting you up for a CT sinus and chest  Follow up Christina Hayes in 1 weeks and As needed    Please contact office for sooner follow up if symptoms do not improve or worsen or seek emergency care

## 2013-02-12 NOTE — Addendum Note (Signed)
Addended by: Parke Poisson E on: 02/12/2013 04:43 PM   Modules accepted: Orders

## 2013-02-12 NOTE — Addendum Note (Signed)
Addended by: Carlos American A on: 02/12/2013 05:00 PM   Modules accepted: Orders

## 2013-02-12 NOTE — Telephone Encounter (Signed)
Called and spoke with the pt  Cough no better even while she was taking pred  OV with TP at 3:30 today

## 2013-02-12 NOTE — Progress Notes (Signed)
Subjective:    Patient ID: Christina Hayes, female    DOB: 06/26/1950   MRN: [id number]  HPI 65 yowf never smoker with some sneezing as child and tendency to croupy cough outgrew by teenage years until around 2010 with variable cough last for weeks to months recurrence since Aug 2014 referred to pulmonary clinic 02/06/2013 by Dr Linda Hedges.  02/06/2013 1st [facility] office visit/ Wert cc daily cough x 5 months recurred on no maint rx,  No pattern day vs night, usually dry but changed x 3 days prior to OV  Much more productive of white mucus assoc with runny nose "like a cold" with low grade fever but no sob unless coughing.    prednsione really helps.  Always takes  pepcid 20 mg at hs  - proaire helps breathing but not coughing "unless it's really bad"   08/14/11 eval by Lucia Gaskins, improved on dulera and declined MCT - pt not sure whether ever had breakthrough symptoms while compliant with dulera but is sure she was on nothing but the hs pepcid when present cough flared  > rec >PPI /pepcid , pred taper . singulair , zpack   02/12/2013 Acute OV  MW pt -  Returns for persistent symptoms with prod cough with clear mucus, some wheezing and dyspnea, x1 week, fever at onset. Fever has resolved . Now coughing up clear, minimal productive.  Started Protonix and Pepcid.  Using delsym and vicodin .  Has  finished zpak and pred taper from last ov. Cough has been going on for last 5-6 yrs not getting any better.  XRay last ov showed mild diffuse slight nodular thickening in the pulmonary interstitium-basilar predominance .  She denies any hemoptysis, orthopnea, PND, or leg swelling. Does have daily. Nasal drip, and drainage.     Current Medications, Allergies, Complete Past Medical History, Past Surgical History, Family History, and Social History were reviewed in [employer] record.  ROS  The following are not active complaints unless bolded sore throat, dysphagia, dental  problems, itching, sneezing,  nasal congestion or excess/ purulent secretions, ear ache,   fever, chills, sweats, unintended wt loss, pleuritic or exertional cp, hemoptysis,  orthopnea pnd or leg swelling, presyncope, palpitations, heartburn, abdominal pain, anorexia, nausea, vomiting, diarrhea  or change in bowel or urinary habits, change in stools or urine, dysuria,hematuria,  rash, arthralgias, visual complaints, headache, numbness weakness or ataxia or problems with walking or coordination,  change in mood/affect or memory.        Review of Systems  Constitutional: Negative for fever, chills and unexpected weight change.  HENT: Positive for sneezing and trouble swallowing. Negative for congestion, dental problem, ear pain, nosebleeds, postnasal drip, rhinorrhea, sinus pressure, sore throat and voice change.   Eyes: Negative for visual disturbance.  Respiratory: Positive for cough. Negative for choking and shortness of breath.   Cardiovascular: Negative for chest pain and leg swelling.  Gastrointestinal: Negative for vomiting, abdominal pain and diarrhea.  Genitourinary: Negative for difficulty urinating.       Heart burn Indigestion  Musculoskeletal: Negative for arthralgias.  Skin: Negative for rash.  Neurological: Positive for headaches. Negative for tremors and syncope.  Hematological: Does not bruise/bleed easily.       Objective:   Physical Exam   amb wf harsh barking cough    HEENT: nl dentition, turbinates, and orophanx. Nl external ear canals without cough reflex   NECK :  without JVD/Nodes/TM/ nl carotid upstrokes bilaterally   LUNGS: no  acc muscle use, scattered rhonchi and exp wheezing    CV:  RRR  no s3 or murmur or increase in P2, no edema   ABD:  soft and nontender with nl excursion in the supine position. No bruits or organomegaly, bowel sounds nl  MS:  warm without deformities, calf tenderness, cyanosis or clubbing  SKIN: warm and dry without lesions     NEURO:  alert, approp, no deficits      CXR  02/06/2013 :  Similar findings most suggestive of airways disease / bronchitis, though note, underlying atypical infection is not excluded. These findings appear similar to most recent comparison examination performed 04/28/2012 and as such, a follow-up chest radiograph in 4-6 weeks after the resolution of symptoms is recommended to ensure complete resolution.        Assessment & Plan:

## 2013-02-13 ENCOUNTER — Other Ambulatory Visit: Payer: Self-pay | Admitting: Internal Medicine

## 2013-02-13 DIAGNOSIS — E782 Mixed hyperlipidemia: Secondary | ICD-10-CM

## 2013-02-13 MED ORDER — SIMVASTATIN 40 MG PO TABS: 40.0000 mg | ORAL_TABLET | Freq: Every day | ORAL | Status: DC

## 2013-02-16 ENCOUNTER — Ambulatory Visit (INDEPENDENT_AMBULATORY_CARE_PROVIDER_SITE_OTHER)
Admission: RE | Admit: 2013-02-16 | Discharge: 2013-02-16 | Disposition: A | Discharge status: Home / Self Care | Payer: 59 | Source: Ambulatory Visit | Attending: Adult Health | Admitting: Adult Health

## 2013-02-16 ENCOUNTER — Ambulatory Visit
Admission: RE | Admit: 2013-02-16 | Discharge: 2013-02-16 | Disposition: A | Discharge status: Home / Self Care | Payer: 59 | Source: Ambulatory Visit | Attending: Adult Health | Admitting: Adult Health

## 2013-02-16 DIAGNOSIS — J45991 Cough variant asthma: Secondary | ICD-10-CM

## 2013-02-16 MED ORDER — IOHEXOL 300 MG/ML  SOLN
80.0000 mL | Freq: Once | INTRAMUSCULAR | Status: AC | PRN
  Administered 2013-02-16: 80 mL via INTRAVENOUS

## 2013-02-17 ENCOUNTER — Telehealth: Payer: Self-pay | Admitting: Adult Health

## 2013-02-17 ENCOUNTER — Encounter: Payer: Self-pay | Admitting: Adult Health

## 2013-02-17 DIAGNOSIS — J45991 Cough variant asthma: Secondary | ICD-10-CM

## 2013-02-17 MED ORDER — AMOXICILLIN-POT CLAVULANATE 875-125 MG PO TABS: 1.0000 | ORAL_TABLET | Freq: Two times a day (BID) | ORAL | Status: DC

## 2013-02-17 NOTE — Telephone Encounter (Signed)
Called pt with CT chest /sinus results Ov with Dr. Melvyn Novas  This week on 1/29  rx for Augmentin x 14 d for sinusitis (CT sinus +)  Pt aware

## 2013-02-19 ENCOUNTER — Ambulatory Visit (INDEPENDENT_AMBULATORY_CARE_PROVIDER_SITE_OTHER): Payer: 59 | Admitting: Internal Medicine

## 2013-02-19 ENCOUNTER — Encounter: Payer: Self-pay | Admitting: Internal Medicine

## 2013-02-19 VITALS — BP 104/60 | HR 66 | Temp 97.5°F | Ht 62.0 in | Wt 181.0 lb

## 2013-02-19 DIAGNOSIS — R918 Other nonspecific abnormal finding of lung field: Secondary | ICD-10-CM

## 2013-02-19 DIAGNOSIS — J45991 Cough variant asthma: Secondary | ICD-10-CM

## 2013-02-19 MED ORDER — MONTELUKAST SODIUM 10 MG PO TABS: ORAL_TABLET | ORAL | Status: DC

## 2013-02-19 MED ORDER — AMOXICILLIN-POT CLAVULANATE 875-125 MG PO TABS: 1.0000 | ORAL_TABLET | Freq: Two times a day (BID) | ORAL | Status: AC

## 2013-02-19 NOTE — Patient Instructions (Addendum)
Start dulera 100 Take 2 puffs first thing in am and then another 2 puffs about 12 hours later.   Stop symbicort and chlortrimeton and the Tylenol pm   Pantoprazole (protonix) 40 mg   Take 30-60 min before first meal of the day and continue  Pepcid 20 mg one bedtime     Singulair 10 mg one in evening.  Continue delsym 2 tsp every 12 hours and if can't stop coughing supplement with vicodin 1 -2 every 4 hours if needed  GERD (REFLUX)  is an extremely common cause of respiratory symptoms, many times with no significant heartburn at all.    It can be treated with medication, but also with lifestyle changes including avoidance of late meals, excessive alcohol, smoking cessation, and avoid fatty foods, chocolate, peppermint, colas, red wine, and acidic juices such as orange juice.  NO MINT OR MENTHOL PRODUCTS SO NO COUGH DROPS  USE SUGARLESS CANDY INSTEAD (jolley ranchers or Stover's)  NO OIL BASED VITAMINS - use powdered substitutes.  Please schedule a follow up office visit in 2 weeks, sooner if needed

## 2013-02-19 NOTE — Progress Notes (Signed)
Subjective:    Patient ID: Christina Hayes, female    DOB: February 17, 1950   MRN: [id number]   Brief patient profile:  57 yowf never smoker with some sneezing as child and tendency to croupy cough outgrew by teenage years until around 2010 with variable cough last for weeks to months recurrence since Aug 2014 referred to pulmonary clinic 02/06/2013 by Dr Linda Hedges.    History of Present Illness  02/06/2013 1st [facility] office visit/ Wert cc daily cough x 5 months recurred on no maint rx,  No pattern day vs night, usually dry but changed x 3 days prior to OV  Much more productive of white mucus assoc with runny nose "like a cold" with low grade fever but no sob unless coughing.  prednsione really helps.  Always takes  pepcid 20 mg at hs  - proaire helps breathing but not coughing "unless it's really bad"   08/14/11 eval by Lucia Gaskins, improved on dulera and declined MCT - pt not sure whether ever had breakthrough symptoms while compliant with dulera but is sure she was on nothing but the hs pepcid when present cough flared  > rec >PPI /pepcid , pred taper . singulair , zpack     02/12/2013 Acute OV/NP   Returns for persistent symptoms with prod cough with clear mucus, some wheezing and dyspnea, x1 week, fever at onset. Fever has resolved . Now coughing up clear, minimal productive.  Started Protonix and Pepcid.  Using delsym and vicodin .  Has  finished zpak and pred taper from last ov. rec  Prednisone taper.  Begin Symbicort 160/4.28mcg 2 puffs Twice daily  .  Continue on Pantoprazole (protonix) 40 mg   Take 30-60 min before first meal of the day and continue  Pepcid 20 mg one bedtime and chlortrimeton 4 mg  until return to office - this is the best way to tell whether stomach acid is contributing to your problem.   Continue delsym 2 tsp every 12 hours and if can't stop coughing supplement with vicodin 1 -2 every 4 hours if needed Add Chlorphenaramine 4mg  2 tabs At bedtime  , may take 1 every  4hr As needed  Drainage.  [name] diet We are setting you up for a CT sinus and chest > pos acute sinusitis and ? endobrochial lesion on L     02/19/2013 f/u ov/Wert re: cough cough since Aug 2014  Chief Complaint  Patient presents with  . Follow-up    Pt states that her breathing is improved some since last visit. Not coughing or wheezing much with minimal clear sputum. No new co's today.      No obvious day to day or daytime variabilty or assoc   cp or chest tightness, subjective wheeze overt sinus or hb symptoms. No unusual exp hx or h/o childhood pna/ asthma or knowledge of premature birth.  Sleeping ok without nocturnal  or early am exacerbation  of respiratory  c/o's or need for noct saba. Also denies any obvious fluctuation of symptoms with weather or environmental changes or other aggravating or alleviating factors except as outlined above   Current Medications, Allergies, Complete Past Medical History, Past Surgical History, Family History, and Social History were reviewed in [employer] record.  ROS  The following are not active complaints unless bolded sore throat, dysphagia, dental problems, itching, sneezing,  nasal congestion or excess/ purulent secretions, ear ache,   fever, chills, sweats, unintended wt loss, pleuritic or exertional cp, hemoptysis,  orthopnea pnd or leg swelling, presyncope, palpitations, heartburn, abdominal pain, anorexia, nausea, vomiting, diarrhea  or change in bowel or urinary habits, change in stools or urine, dysuria,hematuria,  rash, arthralgias, visual complaints, headache, numbness weakness or ataxia or problems with walking or coordination,  change in mood/affect or memory.             Objective:   Physical Exam   amb wf harsh barking cough    Wt Readings from Last 3 Encounters:  02/19/13 181 lb (82.101 kg)  02/12/13 177 lb (80.287 kg)  02/06/13 180 lb (81.647 kg)      HEENT: nl dentition, turbinates, and orophanx.  Nl external ear canals without cough reflex   NECK :  without JVD/Nodes/TM/ nl carotid upstrokes bilaterally   LUNGS: no acc muscle use, scattered min rhonchi and exp wheezing - no more on L vs R    CV:  RRR  no s3 or murmur or increase in P2, no edema   ABD:  soft and nontender with nl excursion in the supine position. No bruits or organomegaly, bowel sounds nl  MS:  warm without deformities, calf tenderness, cyanosis or clubbing  SKIN: warm and dry without lesions            02/16/13 Ct chest 12 x 9 mm intraluminal soft tissue density nodule in the left  mainstem bronchus. Given aspirated foreign body more typically ends  up in the right mainstem bronchus, a true in a bronchial soft tissue  lesion is suspected. Consider bronchoscopy to exclude neoplasm.  14 mm area subtle ground-glass attenuation in the right upper lobe        Assessment & Plan:

## 2013-02-20 NOTE — Assessment & Plan Note (Signed)
-   restarted singulair 02/06/2013 >>> 02/12/2013 CT chest and sinus 02/16/13 >>+sinusitis sphenoid/maxillary >aug rx 21d    The proper method of use, as well as anticipated side effects, of a metered-dose inhaler are discussed and demonstrated to the patient. Improved effectiveness after extensive coaching during this visit to a level of approximately   75% 02/20/13 > rx dulera 100 2bid  (the lower the strength the less likely to aggravate the upper airway component of coughing  She is beginning to turn the corner so will complete max rx for sinusutis then if still coughing consider fob - if not coughing just needs repeat ct limited to concern in LMSB which is probably nothing more than a mucus plug in this never smoker

## 2013-02-20 NOTE — Assessment & Plan Note (Signed)
CT chest 02/16/13 >12 x 9 mm intraluminal soft tissue density nodule in the left mainstem bronchus.  14 mm area subtle ground-glass attenuation in the right upper lobe. >follow up CT chest >  Discussed in detail all the  indications, usual  risks and alternatives  relative to the benefits with patient who agrees to proceed with conservative f/u for now (placed in tickle for f/u ct or fob in 6 weeks depending on whether still coughing as fob will very likely make the cough much worse but if still coughing needs fob anyway)

## 2013-03-05 ENCOUNTER — Ambulatory Visit (INDEPENDENT_AMBULATORY_CARE_PROVIDER_SITE_OTHER): Payer: 59 | Admitting: Internal Medicine

## 2013-03-05 ENCOUNTER — Encounter: Payer: Self-pay | Admitting: Internal Medicine

## 2013-03-05 VITALS — BP 104/60 | HR 72 | Temp 97.9°F | Ht 62.0 in | Wt 181.0 lb

## 2013-03-05 DIAGNOSIS — R918 Other nonspecific abnormal finding of lung field: Secondary | ICD-10-CM

## 2013-03-05 DIAGNOSIS — J45991 Cough variant asthma: Secondary | ICD-10-CM

## 2013-03-05 NOTE — Progress Notes (Signed)
Subjective:    Patient ID: Christina Hayes, female    DOB: 1950-04-16   MRN: [id number]   Brief patient profile:  80 yowf never smoker with some sneezing as child and tendency to croupy cough outgrew by teenage years until around 2010 with variable cough lasts for weeks to months recurrence since Aug 2014 referred to pulmonary clinic 02/06/2013 by Dr Linda Hedges.    History of Present Illness  02/06/2013 1st [facility] office visit/ Aashi Derrington cc daily cough x 5 months recurred on no maint rx,  No pattern day vs night, usually dry but changed x 3 days prior to OV  Much more productive of white mucus assoc with runny nose "like a cold" with low grade fever but no sob unless coughing.  prednsione really helps.  Always takes  pepcid 20 mg at hs  - proaire helps breathing but not coughing "unless it's really bad"   08/14/11 eval by Lucia Gaskins, improved on dulera and declined MCT - pt not sure whether ever had breakthrough symptoms while compliant with dulera but is sure she was on nothing but the hs pepcid when present cough flared  > rec >PPI /pepcid , pred taper . singulair , zpack     02/12/2013 Acute OV/NP   Returns for persistent symptoms with prod cough with clear mucus, some wheezing and dyspnea, x1 week, fever at onset. Fever has resolved . Now coughing up clear, minimal productive.  Started Protonix and Pepcid.  Using delsym and vicodin .  Has  finished zpak and pred taper from last ov. rec  Prednisone taper.  Begin Symbicort 160/4.1mcg 2 puffs Twice daily  .  Continue on Pantoprazole (protonix) 40 mg   Take 30-60 min before first meal of the day and continue  Pepcid 20 mg one bedtime and chlortrimeton 4 mg  until return to office - this is the best way to tell whether stomach acid is contributing to your problem.   Continue delsym 2 tsp every 12 hours and if can't stop coughing supplement with vicodin 1 -2 every 4 hours if needed Add Chlorphenaramine 4mg  2 tabs At bedtime  , may take 1 every  4hr As needed  Drainage.  [name] diet We are setting you up for a CT sinus and chest > pos acute sinusitis and ? endobrochial lesion on L     02/19/2013 f/u ov/Nioka Thorington re: cough since Aug 2014  Chief Complaint  Patient presents with  . Follow-up    Pt states that her breathing is improved some since last visit. Not coughing or wheezing much with minimal clear sputum. No new co's today.    rec Start dulera 100 Take 2 puffs first thing in am and then another 2 puffs about 12 hours later.  Stop symbicort and chlortrimeton and the Tylenol pm  Pantoprazole (protonix) 40 mg   Take 30-60 min before first meal of the day and continue  Pepcid 20 mg one bedtime   Singulair 10 mg one in evening. Continue delsym 2 tsp every 12 hours and if can't stop coughing supplement with vicodin 1 -2 every 4 hours if needed GERD diet   03/05/2013 f/u ov/Ambur Province re: chronic cough/ not maintaining on dulera  Chief Complaint  Patient presents with  . Follow-up    Feeling better,75-80%.cough-dry,no sob,no wheezing,denies cp or tightness,c/o runny nose-clear x 2-3 days.   Not using saba at all.   No obvious day to day or daytime variabilty or assoc   cp or chest tightness,  subjective wheeze overt sinus or hb symptoms. No unusual exp hx or h/o childhood pna/ asthma or knowledge of premature birth.  Sleeping ok without nocturnal  or early am exacerbation  of respiratory  c/o's or need for noct saba. Also denies any obvious fluctuation of symptoms with weather or environmental changes or other aggravating or alleviating factors except as outlined above   Current Medications, Allergies, Complete Past Medical History, Past Surgical History, Family History, and Social History were reviewed in [employer] record.  ROS  The following are not active complaints unless bolded sore throat, dysphagia, dental problems, itching, sneezing,  nasal congestion or excess/ purulent secretions, ear ache,   fever, chills,  sweats, unintended wt loss, pleuritic or exertional cp, hemoptysis,  orthopnea pnd or leg swelling, presyncope, palpitations, heartburn, abdominal pain, anorexia, nausea, vomiting, diarrhea  or change in bowel or urinary habits, change in stools or urine, dysuria,hematuria,  rash, arthralgias, visual complaints, headache, numbness weakness or ataxia or problems with walking or coordination,  change in mood/affect or memory.             Objective:   Physical Exam   amb wf harsh barking cough    03/05/2013        181  Wt Readings from Last 3 Encounters:  02/19/13 181 lb (82.101 kg)  02/12/13 177 lb (80.287 kg)  02/06/13 180 lb (81.647 kg)      HEENT: nl dentition, turbinates, and orophanx. Nl external ear canals without cough reflex   NECK :  without JVD/Nodes/TM/ nl carotid upstrokes bilaterally   LUNGS: no acc muscle use, scattered min rhonchi and exp wheezing - ? slt more on L    CV:  RRR  no s3 or murmur or increase in P2, no edema   ABD:  soft and nontender with nl excursion in the supine position. No bruits or organomegaly, bowel sounds nl  MS:  warm without deformities, calf tenderness, cyanosis or clubbing  SKIN: warm and dry without lesions            02/16/13 Ct chest 12 x 9 mm intraluminal soft tissue density nodule in the left  mainstem bronchus. Given aspirated foreign body more typically ends  up in the right mainstem bronchus, a true in a bronchial soft tissue  lesion is suspected. Consider bronchoscopy to exclude neoplasm.  14 mm area subtle ground-glass attenuation in the right upper lobe        Assessment & Plan:

## 2013-03-05 NOTE — Patient Instructions (Addendum)
Come to [facility] Feb 17th and nothing to eat or drink after midnight Monday. Outpatient Registration for bronchoscopy at 715 am    No augmentin after Sunday am 2/15 (if you run out before Sunday that's fine)   Continue dulera Take 2 puffs first thing in am and then another 2 puffs about 12 hours later.

## 2013-03-05 NOTE — Assessment & Plan Note (Addendum)
-   restarted singulair 02/06/2013 >>> 02/12/2013 CT chest and sinus 02/16/13 >>+sinusitis sphenoid/maxillary >aug rx 21d  - hfa 75% 02/20/13 > rx dulera 100 2bid but not using 03/05/2013  - 03/05/2013 p extensive coaching HFA effectiveness =    75% > restart dulera  I had an extended discussion with the patient today lasting 15 to 20 minutes of a 25 minute visit on the following issues:  - not clear at this point what's causing the refractory cough but has abnormal CT chest and haven't excluded eos bronchitis  Discussed in detail all the  indications, usual  risks and alternatives  relative to the benefits with patient who agrees to proceed with bronchoscopy with biopsy > 03/10/13

## 2013-03-05 NOTE — Assessment & Plan Note (Signed)
See cough a/p

## 2013-03-09 ENCOUNTER — Telehealth: Payer: Self-pay | Admitting: Internal Medicine

## 2013-03-09 NOTE — Telephone Encounter (Signed)
Try Feb 26th and let bronch lab know if she can confirm

## 2013-03-09 NOTE — Telephone Encounter (Signed)
I called and spoke with pt. Pt is needing to r/s bronch. She is not able to do wednesdays. Please advise MW thanks

## 2013-03-09 NOTE — Telephone Encounter (Signed)
lmomtcb x1 for pt to confirm date with pt

## 2013-03-09 NOTE — Telephone Encounter (Signed)
Spoke with pt and bronch lab and confirmed appt change to 03/19/13.   - FYI for Dr Melvyn Novas

## 2013-03-10 ENCOUNTER — Encounter (HOSPITAL_COMMUNITY): Payer: 59

## 2013-03-16 ENCOUNTER — Telehealth: Payer: Self-pay | Admitting: Internal Medicine

## 2013-03-16 NOTE — Telephone Encounter (Signed)
Spoke with pt. She reports she has been reading on the bronchoscopy. She stated on some of the equipment she read it had a super bug and it has killed 2 ppl. The equipment used was sterile equipment. She does not want to have this procedure done if she is at this risk. Please advise MW thanks

## 2013-03-17 NOTE — Telephone Encounter (Signed)
Discussed in detail all the  indications, usual  risks and alternatives  relative to the benefits with patient who agrees to proceed with bronchoscopy

## 2013-03-18 ENCOUNTER — Telehealth: Payer: Self-pay | Admitting: Internal Medicine

## 2013-03-18 NOTE — Telephone Encounter (Signed)
I called Christina Hayes. She r/s bronch to next Thursday 03/26/13.  i called and made pt aware. Will forward to MW as well.

## 2013-03-18 NOTE — Telephone Encounter (Signed)
Called spoke with pt. She is cancelling bronch for tomorrow morning. She doesn't want to risk d/t bad weather. I called and lm for michelle to make aware. She reports she can do any day but Wednesdays. Please advise MW thanks

## 2013-03-18 NOTE — Telephone Encounter (Signed)
Let's try next Thursday then

## 2013-03-19 ENCOUNTER — Encounter (HOSPITAL_COMMUNITY): Payer: 59

## 2013-03-19 ENCOUNTER — Ambulatory Visit (HOSPITAL_COMMUNITY): Admission: RE | Admit: 2013-03-19 | Payer: 59 | Source: Ambulatory Visit | Admitting: Internal Medicine

## 2013-03-19 ENCOUNTER — Encounter (HOSPITAL_COMMUNITY): Admission: RE | Payer: Self-pay | Source: Ambulatory Visit

## 2013-03-19 SURGERY — VIDEO BRONCHOSCOPY WITHOUT FLUORO
Anesthesia: Moderate Sedation | Laterality: Bilateral

## 2013-03-26 ENCOUNTER — Encounter (HOSPITAL_COMMUNITY)
Admission: RE | Disposition: A | Discharge status: Home / Self Care | Payer: Self-pay | Source: Ambulatory Visit | Attending: Internal Medicine

## 2013-03-26 ENCOUNTER — Encounter: Payer: Self-pay | Admitting: Internal Medicine

## 2013-03-26 ENCOUNTER — Ambulatory Visit (HOSPITAL_COMMUNITY)
Admission: RE | Admit: 2013-03-26 | Discharge: 2013-03-26 | Disposition: A | Discharge status: Home / Self Care | Payer: 59 | Source: Ambulatory Visit | Attending: Internal Medicine | Admitting: Internal Medicine

## 2013-03-26 ENCOUNTER — Encounter (HOSPITAL_COMMUNITY): Payer: Self-pay

## 2013-03-26 ENCOUNTER — Other Ambulatory Visit: Payer: Self-pay | Admitting: Internal Medicine

## 2013-03-26 DIAGNOSIS — R6889 Other general symptoms and signs: Secondary | ICD-10-CM | POA: Insufficient documentation

## 2013-03-26 DIAGNOSIS — R918 Other nonspecific abnormal finding of lung field: Secondary | ICD-10-CM

## 2013-03-26 DIAGNOSIS — R509 Fever, unspecified: Secondary | ICD-10-CM | POA: Insufficient documentation

## 2013-03-26 DIAGNOSIS — J45991 Cough variant asthma: Secondary | ICD-10-CM | POA: Insufficient documentation

## 2013-03-26 DIAGNOSIS — R059 Cough, unspecified: Secondary | ICD-10-CM | POA: Insufficient documentation

## 2013-03-26 DIAGNOSIS — Z79899 Other long term (current) drug therapy: Secondary | ICD-10-CM | POA: Insufficient documentation

## 2013-03-26 DIAGNOSIS — J984 Other disorders of lung: Secondary | ICD-10-CM | POA: Insufficient documentation

## 2013-03-26 DIAGNOSIS — R05 Cough: Secondary | ICD-10-CM | POA: Insufficient documentation

## 2013-03-26 DIAGNOSIS — R0989 Other specified symptoms and signs involving the circulatory and respiratory systems: Secondary | ICD-10-CM | POA: Insufficient documentation

## 2013-03-26 DIAGNOSIS — R222 Localized swelling, mass and lump, trunk: Secondary | ICD-10-CM | POA: Insufficient documentation

## 2013-03-26 DIAGNOSIS — R911 Solitary pulmonary nodule: Secondary | ICD-10-CM | POA: Insufficient documentation

## 2013-03-26 DIAGNOSIS — R0609 Other forms of dyspnea: Secondary | ICD-10-CM | POA: Insufficient documentation

## 2013-03-26 HISTORY — PX: VIDEO BRONCHOSCOPY: SHX5072

## 2013-03-26 LAB — BODY FLUID CELL COUNT WITH DIFFERENTIAL
Eos, Fluid: 3 %
LYMPHS FL: 9 %
MONOCYTE-MACROPHAGE-SEROUS FLUID: 16 % — AB (ref 50–90)
NEUTROPHIL FLUID: 72 % — AB (ref 0–25)
WBC FLUID: 13 uL (ref 0–1000)

## 2013-03-26 SURGERY — VIDEO BRONCHOSCOPY WITHOUT FLUORO
Anesthesia: Moderate Sedation | Laterality: Bilateral

## 2013-03-26 MED ORDER — MEPERIDINE HCL 100 MG/ML IJ SOLN
INTRAMUSCULAR | Status: AC
  Filled 2013-03-26: qty 2

## 2013-03-26 MED ORDER — MEPERIDINE HCL 25 MG/ML IJ SOLN
INTRAMUSCULAR | Status: DC | PRN
  Administered 2013-03-26: 50 mg via INTRAVENOUS

## 2013-03-26 MED ORDER — PHENYLEPHRINE HCL 0.25 % NA SOLN
NASAL | Status: DC | PRN
Start: 2013-03-26 — End: 2013-03-26
  Administered 2013-03-26: 2 via NASAL

## 2013-03-26 MED ORDER — MIDAZOLAM HCL 10 MG/2ML IJ SOLN
INTRAMUSCULAR | Status: DC | PRN
  Administered 2013-03-26 (×2): 2.5 mg via INTRAVENOUS

## 2013-03-26 MED ORDER — LIDOCAINE HCL (PF) 1 % IJ SOLN
INTRAMUSCULAR | Status: DC | PRN
  Administered 2013-03-26: 6 mL

## 2013-03-26 MED ORDER — MIDAZOLAM HCL 10 MG/2ML IJ SOLN
INTRAMUSCULAR | Status: AC
  Filled 2013-03-26: qty 4

## 2013-03-26 MED ORDER — LIDOCAINE HCL 2 % EX GEL
CUTANEOUS | Status: DC | PRN
  Administered 2013-03-26: 1

## 2013-03-26 NOTE — H&P (Signed)
Brief patient profile:  43 yowf never smoker with some sneezing as child and tendency to croupy cough outgrew by teenage years until around 2010 with variable cough lasts for weeks to months recurrence since Aug 2014 referred to pulmonary clinic 02/06/2013 by Dr Christina Hayes.       History of Present Illness   02/06/2013 1st [facility] office visit/ Christina Hayes cc daily cough x 5 months recurred on no maint rx,  No pattern day vs night, usually dry but changed x 3 days prior to OV  Much more productive of white mucus assoc with runny nose "like a cold" with low grade fever but no sob unless coughing.   prednsione really helps.  Always takes  pepcid 20 mg at hs  - proaire helps breathing but not coughing "unless it's really bad"    08/14/11 eval by Christina Hayes, improved on dulera and declined MCT - pt not sure whether ever had breakthrough symptoms while compliant with dulera but is sure she was on nothing but the hs pepcid when present cough flared   > rec >PPI /pepcid , pred taper . singulair , zpack        02/12/2013 Acute OV/NP    Returns for persistent symptoms with prod cough with clear mucus, some wheezing and dyspnea, x1 week, fever at onset. Fever has resolved . Now coughing up clear, minimal productive.   Started Protonix and Pepcid.   Using delsym and vicodin .   Has  finished zpak and pred taper from last ov. rec   Prednisone taper.   Begin Symbicort 160/4.13mcg 2 puffs Twice daily  .   Continue on Pantoprazole (protonix) 40 mg   Take 30-60 min before first meal of the day and continue  Pepcid 20 mg one bedtime and chlortrimeton 4 mg  until return to office - this is the best way to tell whether stomach acid is contributing to your problem.    Continue delsym 2 tsp every 12 hours and if can't stop coughing supplement with vicodin 1 -2 every 4 hours if needed Add Chlorphenaramine 4mg  2 tabs At bedtime  , may take 1 every 4hr As needed  Drainage.   Christina Hayes diet We are setting you up for  a CT sinus and chest > pos acute sinusitis and ? endobrochial lesion on L        02/19/2013 f/u ov/Christina Hayes re: cough since Aug 2014   Chief Complaint   Patient presents with   .  Follow-up       Pt states that her breathing is improved some since last visit. Not coughing or wheezing much with minimal clear sputum. No new co's today.      rec Start dulera 100 Take 2 puffs first thing in am and then another 2 puffs about 12 hours later.   Stop symbicort and chlortrimeton and the Tylenol pm   Pantoprazole (protonix) 40 mg   Take 30-60 min before first meal of the day and continue Pepcid 20 mg one bedtime    Singulair 10 mg one in evening. Continue delsym 2 tsp every 12 hours and if can't stop coughing supplement with vicodin 1 -2 every 4 hours if needed GERD diet     03/05/2013 f/u ov/Christina Hayes re: chronic cough/ not maintaining on dulera   Chief Complaint   Patient presents with   .  Follow-up       Feeling better,75-80%.cough-dry,no sob,no wheezing,denies cp or tightness,c/o runny nose-clear x 2-3 days.  Not using saba at all.    No obvious day to day or daytime variabilty or assoc   cp or chest tightness, subjective wheeze overt sinus or hb symptoms. No unusual exp hx or h/o childhood pna/ asthma or knowledge of premature birth.   Sleeping ok without nocturnal  or early am exacerbation  of respiratory  c/o's or need for noct saba. Also denies any obvious fluctuation of symptoms with weather or environmental changes or other aggravating or alleviating factors except as outlined above    Current Medications, Allergies, Complete Past Medical History, Past Surgical History, Family History, and Social History were reviewed in [employer] record.   ROS  The following are not active complaints unless bolded sore throat, dysphagia, dental problems, itching, sneezing,  nasal congestion or excess/ purulent secretions, ear ache,   fever, chills, sweats, unintended wt  loss, pleuritic or exertional cp, hemoptysis,  orthopnea pnd or leg swelling, presyncope, palpitations, heartburn, abdominal pain, anorexia, nausea, vomiting, diarrhea  or change in bowel or urinary habits, change in stools or urine, dysuria,hematuria,  rash, arthralgias, visual complaints, headache, numbness weakness or ataxia or problems with walking or coordination,  change in mood/affect or memory.                  Objective:     Physical Exam     amb wf harsh barking cough     03/05/2013        181   Wt Readings from Last 3 Encounters:   02/19/13  181 lb (82.101 kg)   02/12/13  177 lb (80.287 kg)   02/06/13  180 lb (81.647 kg)         HEENT: nl dentition, turbinates, and orophanx. Nl external ear canals without cough reflex     NECK :  without JVD/Nodes/TM/ nl carotid upstrokes bilaterally     LUNGS: no acc muscle use, scattered min rhonchi and exp wheezing - ? slt more on L      CV:  RRR  no s3 or murmur or increase in P2, no edema    ABD:  soft and nontender with nl excursion in the supine position. No bruits or organomegaly, bowel sounds nl   MS:  warm without deformities, calf tenderness, cyanosis or clubbing   SKIN: warm and dry without lesions                 02/16/13 Ct chest 12 x 9 mm intraluminal soft tissue density nodule in the left   mainstem bronchus. Given aspirated foreign body more typically ends   up in the right mainstem bronchus, a true in a bronchial soft tissue   lesion is suspected. Consider bronchoscopy to exclude neoplasm.   14 mm area subtle ground-glass attenuation in the right upper lobe             Assessment & Plan:    Cough variant asthma -     - restarted singulair 02/06/2013 >>> 02/12/2013 CT chest and sinus 02/16/13 >>+sinusitis sphenoid/maxillary >aug rx 21d   - hfa 75% 02/20/13 > rx dulera 100 2bid but not using 03/05/2013   - 03/05/2013 p extensive coaching HFA effectiveness =    75% > restart  dulera   I had an extended discussion with the patient today lasting 15 to 20 minutes of a 25 minute visit on the following issues:  - not clear at this point what's causing the refractory cough but has abnormal CT chest and haven't  excluded eos bronchitis   Discussed in detail all the  indications, usual  risks and alternatives  relative to the benefits with patient who agrees to proceed with bronchoscopy with biopsy    03/26/2013 Day of FOB No sob and no sputum production, overall better than baseline.    Christinia Gully, MD Pulmonary and [place] [phone] After 5:30 PM or weekends, call [phone]

## 2013-03-26 NOTE — Op Note (Signed)
Bronchoscopy Procedure Note  Date of Operation: 03/26/2013   Pre-op Diagnosis: mass L MSB  Post-op Diagnosis: Polypoid lesion L MSB  Surgeon: Christinia Gully  Anesthesia: Monitored Local Anesthesia with Sedation  Operation: Video Flexible fiberoptic bronchoscopy, diagnostic   Findings: Polypoid lesion prolapsing and obst LMSB distally with smooth edges but vessels seen  Specimen: washings only  Estimated Blood Loss: none   Complications: None  Indications and History: See updated H and P same date. The risks, benefits, complications, treatment options and expected outcomes were discussed with the patient.  The possibilities of reaction to medication, pulmonary aspiration, perforation of a viscus, bleeding, failure to diagnose a condition and creating a complication requiring transfusion or operation were discussed with the patient who freely signed the consent.    Description of Procedure: The patient was re-examined in the bronchoscopy suite and the site of surgery properly noted/marked.  The patient was identified  and the procedure verified as Flexible Fiberoptic Bronchoscopy.  A Time Out was held and the above information confirmed.   After the induction of topical nasopharyngeal anesthesia, the patient was positioned  and the bronchoscope was passed through the Right naris. The vocal cords were visualized and  1% buffered lidocaine 5 ml was topically placed onto the cords. The cords were nl. The scope was then passed into the trachea.  1% buffered lidocaine given topically. Airways inspected bilaterally to the subsegmental level with the following findings:  1)  Trachea carinal and all R sided airways nl  2) Polypoid mass with vessels prolapsing up into LMSB from ? Origin, did not bx as risk of bleeding and loss of airway  Procedure: bronchial washings only      The Patient was taken to the Endoscopy Recovery area in satisfactory condition.  Attestation: I performed the  procedure.  Christinia Gully, MD Pulmonary and [place] [phone] After 5:30 PM or weekends, call [phone]

## 2013-03-26 NOTE — Discharge Instructions (Signed)
Flexible Bronchoscopy, Care After These instructions give you information on caring for yourself after your procedure. Your doctor may also give you more specific instructions. Call your doctor if you have any problems or questions after your procedure. HOME CARE  Do not eat or drink anything for 2 hours after your procedure. If you try to eat or drink before the medicine wears off, food or drink could go into your lungs. You could also burn yourself.  After 2 hours have passed and when you can cough and gag normally, you may eat soft food and drink liquids slowly.  The day after the test, you may eat your normal diet.  You may do your normal activities.  Keep all doctor visits. GET HELP RIGHT AWAY IF:  You get more and more short of breath.  You get lightheaded.  You feel like you are going to pass out (faint).  You have chest pain.  You have new problems that worry you.  You cough up more than a little blood.  You cough up more blood than before. MAKE SURE YOU:  Understand these instructions.  Will watch your condition.  Will get help right away if you are not doing well or get worse.  Patient may begin eating and drinking at 10:30 am. Start with clear liquids to make sure you can feel the liquid moving down throat, then proceed with soft foods, then regular diet.  Document Released: 11/05/2008 Document Revised: 10/29/2012 Document Reviewed: 09/12/2012 [facility] Patient Information 2014 [place].

## 2013-03-26 NOTE — Progress Notes (Signed)
Video bronchoscopy procedure performed. Bronchial washings intervention performed 

## 2013-03-29 ENCOUNTER — Encounter: Payer: Self-pay | Admitting: Internal Medicine

## 2013-03-30 ENCOUNTER — Encounter (HOSPITAL_COMMUNITY): Payer: Self-pay | Admitting: Internal Medicine

## 2013-03-31 ENCOUNTER — Encounter: Payer: 59 | Admitting: Thoracic Surgery (Cardiothoracic Vascular Surgery)

## 2013-03-31 ENCOUNTER — Telehealth: Payer: Self-pay | Admitting: Internal Medicine

## 2013-03-31 NOTE — Telephone Encounter (Signed)
Spoke with pt. States that she has been doing research on PET scans. Read that MRI's so the exact same things as a PET scan without all the radiation. She would like to know if we can do a MRI instead of a PET.  MW - please advise. Thanks.

## 2013-03-31 NOTE — Telephone Encounter (Signed)
Pt aware. Nothing further needed 

## 2013-03-31 NOTE — Telephone Encounter (Signed)
Not for the lung - MRI very good except for the lung so is not used in this setting and I doubt surgeon will be willing to take on her problem s PET first but she's certainly welcome to wait until she sees the surgeon if she has concerns

## 2013-04-03 ENCOUNTER — Ambulatory Visit (HOSPITAL_COMMUNITY): Payer: 59

## 2013-04-06 ENCOUNTER — Ambulatory Visit (HOSPITAL_COMMUNITY): Payer: 59

## 2013-04-07 ENCOUNTER — Encounter: Payer: 59 | Admitting: Thoracic Surgery (Cardiothoracic Vascular Surgery)

## 2013-04-08 ENCOUNTER — Ambulatory Visit (HOSPITAL_COMMUNITY): Payer: 59

## 2013-04-09 ENCOUNTER — Encounter: Payer: Self-pay | Admitting: Internal Medicine

## 2013-04-09 ENCOUNTER — Ambulatory Visit (HOSPITAL_COMMUNITY)
Admission: RE | Admit: 2013-04-09 | Discharge: 2013-04-09 | Disposition: A | Discharge status: Home / Self Care | Payer: 59 | Source: Ambulatory Visit | Attending: Internal Medicine | Admitting: Internal Medicine

## 2013-04-09 DIAGNOSIS — R918 Other nonspecific abnormal finding of lung field: Secondary | ICD-10-CM

## 2013-04-09 DIAGNOSIS — R222 Localized swelling, mass and lump, trunk: Secondary | ICD-10-CM | POA: Insufficient documentation

## 2013-04-09 DIAGNOSIS — K449 Diaphragmatic hernia without obstruction or gangrene: Secondary | ICD-10-CM | POA: Insufficient documentation

## 2013-04-09 DIAGNOSIS — Z90710 Acquired absence of both cervix and uterus: Secondary | ICD-10-CM | POA: Insufficient documentation

## 2013-04-09 DIAGNOSIS — J984 Other disorders of lung: Secondary | ICD-10-CM | POA: Insufficient documentation

## 2013-04-09 LAB — GLUCOSE, CAPILLARY: GLUCOSE-CAPILLARY: 104 mg/dL — AB (ref 70–99)

## 2013-04-09 MED ORDER — FLUDEOXYGLUCOSE F - 18 (FDG) INJECTION
10.0000 | Freq: Once | INTRAVENOUS | Status: AC | PRN
  Administered 2013-04-09: 10 via INTRAVENOUS

## 2013-04-14 ENCOUNTER — Encounter: Payer: Self-pay | Admitting: Thoracic Surgery (Cardiothoracic Vascular Surgery)

## 2013-04-14 ENCOUNTER — Institutional Professional Consult (permissible substitution) (INDEPENDENT_AMBULATORY_CARE_PROVIDER_SITE_OTHER): Payer: 59 | Admitting: Thoracic Surgery (Cardiothoracic Vascular Surgery)

## 2013-04-14 ENCOUNTER — Other Ambulatory Visit: Payer: Self-pay

## 2013-04-14 ENCOUNTER — Encounter: Payer: Self-pay | Admitting: Internal Medicine

## 2013-04-14 VITALS — BP 114/63 | HR 72 | Resp 20 | Ht 62.0 in | Wt 181.0 lb

## 2013-04-14 DIAGNOSIS — D491 Neoplasm of unspecified behavior of respiratory system: Secondary | ICD-10-CM

## 2013-04-14 DIAGNOSIS — R222 Localized swelling, mass and lump, trunk: Secondary | ICD-10-CM

## 2013-04-14 DIAGNOSIS — R918 Other nonspecific abnormal finding of lung field: Secondary | ICD-10-CM

## 2013-04-14 NOTE — Progress Notes (Signed)
PCP is Adella Hare, MD Referring Provider is Tanda Rockers, MD  Chief Complaint  Patient presents with  . Mediastinal Mass    Surgical eval, PET Scan 04/09/2013, Chest CT 02/16/13    HPI: Christina Hayes is a 63 year old woman who presents for evaluation of an endobronchial mass.  Christina Hayes says that she's been having difficulty with frequent cough, wheezing, and repeated bronchitis over the past several years. She is a lifelong nonsmoker and had no history of childhood asthma. Several years ago, approximately 5, she began having episodes of wheezing. She also developed a frequent cough. She says that her symptoms became much worse about 5 months ago. Since then she's had nearly continuous exacerbations of her symptoms. She has been treated with bronchodilators and antibiotics. She says that her symptoms will get better for a while but recurred rapidly.  She had a CT done in January which showed a endobronchial mass in the left mainstem bronchus. She was referred to Dr. Melvyn Novas. On March 5 he did a bronchoscopy where he found and endobronchial mass in the left mainstem which was nearly completely obstructing the bronchus. It was not biopsied due to risk of bleeding. Cytologies were benign.  She then had a PET/CT done on March 19 which showed the lesion to be mildly hypermetabolic.  Of note she also has a 14 mm groundglass opacity in the right upper lobe.  She is not currently experiencing fevers or chills. She does have her usual cough.  Past Medical History  Diagnosis Date  . HYPERLIPIDEMIA, MIXED 12/03/2006    PT STATES NOT TAKING MED AS PRESCRIBED  . VERTIGO, HX OF 12/03/2006  . MIGRAINES, HX OF 12/03/2006  . Allergic rhinitis, cause unspecified   . SUI (stress urinary incontinence, female)   . OSA on CPAP   . Head cold   . Cough   . Frequency of urination   . Urgency of urination   . Nocturia   . Acid reflux   . Complication of anesthesia SLOW TO WAKE    Past Surgical History   Procedure Laterality Date  . Salpingoophorectomy  1995    BILATERAL  . Vaginal hysterectomy  1988  . Pubovaginal sling  04/30/2011    Procedure: Gaynelle Arabian;  Surgeon: Bernestine Amass, MD;  Location: [facility];  Service: Urology;  Laterality: N/A;  sub urethral sling    possible ower  . Video bronchoscopy Bilateral 03/26/2013    Procedure: VIDEO BRONCHOSCOPY WITHOUT FLUORO;  Surgeon: Tanda Rockers, MD;  Location: [facility];  Service: Cardiopulmonary;  Laterality: Bilateral;    Family History  Problem Relation Age of Onset  . Cancer Mother     Colon Cancer survivor  . Diabetes Mother   . Diabetes Father   . Hyperlipidemia Father   . Alzheimer's disease Father   . Cancer Maternal Grandmother     Breast Cancer  . Cancer Daughter 56    stage III-B breast CA  . Allergies Daughter     Social History History  Substance Use Topics  . Smoking status: Never Smoker   . Smokeless tobacco: Never Used  . Alcohol Use: No    Current Outpatient Prescriptions  Medication Sig Dispense Refill  . diphenhydramine-acetaminophen (TYLENOL PM) 25-500 MG TABS Take 1 tablet by mouth at bedtime as needed.      . famotidine (PEPCID) 20 MG tablet Take 20 mg by mouth at bedtime.       . montelukast (SINGULAIR) 10 MG tablet  One at bedtime every night      . pantoprazole (PROTONIX) 40 MG tablet Take 1 tablet (40 mg total) by mouth daily. Take 30-60 min before first meal of the day  30 tablet  2  . simvastatin (ZOCOR) 20 MG tablet Take 20 mg by mouth daily.       No current facility-administered medications for this visit.    Allergies  Allergen Reactions  . Codeine Itching  . Tramadol     headache    Review of Systems  Constitutional: Positive for fatigue and unexpected weight change (weight gain).  HENT: Positive for congestion and postnasal drip.   Respiratory: Positive for apnea (uses CPAP), cough, shortness of breath (with exertion) and wheezing.   Cardiovascular:  Negative for chest pain.  Gastrointestinal: Positive for diarrhea.       Heart burn, reflux, hiatal hernia  Skin:       itching  Neurological: Positive for numbness (in hands) and headaches.  Hematological: Bruises/bleeds easily.  All other systems reviewed and are negative.    BP 114/63  Pulse 72  Resp 20  Ht 5\' 2"  (1.575 m)  Wt 181 lb (82.101 kg)  BMI 33.10 kg/m2  SpO2 98% Physical Exam  Vitals reviewed. Constitutional: She is oriented to person, place, and time. She appears well-developed and well-nourished. No distress.  HENT:  Head: Normocephalic and atraumatic.  Eyes: EOM are normal. Pupils are equal, round, and reactive to light.  Neck: Neck supple. No thyromegaly present.  Cardiovascular: Normal rate and regular rhythm.  Exam reveals no gallop and no friction rub.   No murmur heard. Pulmonary/Chest: Effort normal. She has wheezes (faint left side).  Abdominal: Soft. There is no tenderness.  Musculoskeletal: She exhibits no edema.  Lymphadenopathy:    She has no cervical adenopathy.  Neurological: She is alert and oriented to person, place, and time. No cranial nerve deficit.  Skin: Skin is warm and dry.     Diagnostic Tests: CT CHEST WITH CONTRAST  TECHNIQUE:  Multidetector CT imaging of the chest was performed during  intravenous contrast administration.  CONTRAST: 59mL OMNIPAQUE IOHEXOL 300 MG/ML SOLN  FINDINGS:  There is no axillary lymphadenopathy. No mediastinal or hilar  lymphadenopathy. Heart size is normal. No pericardial or pleural  effusion.  No pulmonary nodule or mass in the right lung. There is no focal  airspace consolidation in the right lung. No evidence for  bronchiectasisa. A 14 mm area of subtle ground-glass attenuation is  identified in the right upper lobe (see image 20 series 3).  No focal airspace consolidation in the left lung. No pulmonary  nodular mass in the left lung.  The patient does have a 12 x 9 mm nodule in the left  mainstem  bronchus which may arise from the posterior wall, just proximal to  the bifurcation of the upper and lower lobe airways.  Bone windows reveal no worrisome lytic or sclerotic osseous lesions.  Imaging of the upper abdomen shows a tiny hiatal hernia, but is  otherwise unremarkable.  IMPRESSION:  12 x 9 mm intraluminal soft tissue density nodule in the left  mainstem bronchus. Given aspirated foreign body more typically ends  up in the right mainstem bronchus, a true in a bronchial soft tissue  lesion is suspected. Consider bronchoscopy to exclude neoplasm.  14 mm area subtle ground-glass attenuation in the right upper lobe.  Initial follow-up CT without contrast in 3 months is recommended to  confirm persistence. The This recommendation follows  the consensus  statement: Recommendations for the Management of Subsolid Pulmonary  Nodules Detected at CT: A Statement from the [facility] as  published in Radiology 2013; 266:304-317.  I discussed these results with the practitioner, Tammy Parrett, at  1457 hr on 02/16/2013.  Electronically Signed  By: Misty Stanley M.D.  On: 02/16/2013 14:58   NUCLEAR MEDICINE PET SKULL BASE TO THIGH  TECHNIQUE:  10 MCi F-18 FDG was injected intravenously. Full-ring PET imaging  was performed from the skull base to thigh after the radiotracer. CT  data was obtained and used for attenuation correction and anatomic  localization.  FASTING BLOOD GLUCOSE: Value: 104 mg/dl  COMPARISON: Biopsy results of 03/26/2012. . Chest CT 02/16/2013.  FINDINGS:  NECK  No areas of abnormal hypermetabolism.  CHEST  The left upper lobe endobronchial lesion demonstrates low level,  nonspecific metabolism. This is similar to the surrounding  mediastinal pool. Measures 1.3 x 0 0.9 cm and a S.U.V. max of 2.8 on  image 30/series 8. No thoracic nodal or pulmonary parenchymal  hypermetabolism identified.  ABDOMEN/PELVIS  No areas of abnormal hypermetabolism.   SKELETON  Mild soft tissue thickening and hypermetabolism about the proximal  right femur. Favored to be degenerative or due to muscular strain.  CT IMAGES PERFORMED FOR ATTENUATION CORRECTION  No significant findings within the neck. Mucosal thickening of  bilateral maxillary sinuses.  Small hiatal hernia. Right upper lobe ground-glass opacity is not  well evaluated on this nondedicated CT. Hysterectomy.  IMPRESSION:  1. Left upper lobe endobronchial lesion with low-level nonspecific  hypermetabolism. This could represent a benign lesion or low grade  malignancy.  2. No evidence of pulmonary parenchymal or nodal hypermetabolic  disease.  Electronically Signed  By: Abigail Miyamoto M.D.  On: 04/09/2013 12:41   Impression: 63 year old nonsmoker with a left mainstem bronchial mass. This is nearly completely obstructing the bronchus and is causing a wheezing and relapsing bronchitis. This mass needs to be resected. The mass is pedunculated and is likely a benign tumor. It does appear to be amenable to endobronchial resection.  I discussed the proposed procedure with Christina Hayes in detail. I recommended bronchoscopic resection of the tumor. This will likely require combination of flexible and rigid bronchoscopy and laser bronchoscopy. We discussed the general nature of the procedure, the need to do this in the operating room under general anesthesia, and the likelihood that this can be done on an outpatient basis. They do understand with this approach that recurrence is possible and she will need to be monitored for that possibility.  We discussed the indications, risks, benefits, and alternatives. She understands the risk include those associated with general anesthesia. She understands the procedural risks include, but are not limited to death, MI, DVT, PE, bleeding, possible need for transfusion, possible need for thoracotomy, bronchial injury, pneumothorax, as well as the possibility of  unforeseeable complications. She accepts the risks and wishes to proceed.  Plan: Bronchoscopic resection of left mainstem endobronchial mass on Thursday, April 2

## 2013-04-15 ENCOUNTER — Encounter: Payer: Self-pay | Admitting: Internal Medicine

## 2013-04-15 ENCOUNTER — Encounter (HOSPITAL_COMMUNITY): Payer: Self-pay | Admitting: Pharmacy Technician

## 2013-04-15 DIAGNOSIS — R059 Cough, unspecified: Secondary | ICD-10-CM

## 2013-04-15 DIAGNOSIS — R05 Cough: Secondary | ICD-10-CM

## 2013-04-15 MED ORDER — PANTOPRAZOLE SODIUM 40 MG PO TBEC: 40.0000 mg | DELAYED_RELEASE_TABLET | Freq: Every day | ORAL | Status: DC

## 2013-04-16 ENCOUNTER — Encounter (HOSPITAL_COMMUNITY): Payer: Self-pay

## 2013-04-16 ENCOUNTER — Encounter (HOSPITAL_COMMUNITY)
Admission: RE | Admit: 2013-04-16 | Discharge: 2013-04-16 | Disposition: A | Discharge status: Home / Self Care | Payer: 59 | Source: Ambulatory Visit | Attending: Thoracic Surgery (Cardiothoracic Vascular Surgery) | Admitting: Thoracic Surgery (Cardiothoracic Vascular Surgery)

## 2013-04-16 VITALS — BP 114/71 | HR 83 | Temp 98.4°F | Resp 20 | Ht 62.0 in | Wt 182.3 lb

## 2013-04-16 DIAGNOSIS — Z01812 Encounter for preprocedural laboratory examination: Secondary | ICD-10-CM | POA: Insufficient documentation

## 2013-04-16 DIAGNOSIS — D491 Neoplasm of unspecified behavior of respiratory system: Secondary | ICD-10-CM

## 2013-04-16 HISTORY — DX: Shortness of breath: R06.02

## 2013-04-16 HISTORY — DX: Unspecified osteoarthritis, unspecified site: M19.90

## 2013-04-16 HISTORY — DX: Other specified postprocedural states: Z98.890

## 2013-04-16 HISTORY — DX: Personal history of other diseases of the digestive system: Z87.19

## 2013-04-16 HISTORY — DX: Headache: R51

## 2013-04-16 HISTORY — DX: Nausea with vomiting, unspecified: R11.2

## 2013-04-16 LAB — COMPREHENSIVE METABOLIC PANEL
ALK PHOS: 112 U/L (ref 39–117)
ALT: 22 U/L (ref 0–35)
AST: 29 U/L (ref 0–37)
Albumin: 3.8 g/dL (ref 3.5–5.2)
BILIRUBIN TOTAL: 0.3 mg/dL (ref 0.3–1.2)
BUN: 9 mg/dL (ref 6–23)
CHLORIDE: 102 meq/L (ref 96–112)
CO2: 26 mEq/L (ref 19–32)
Calcium: 9.8 mg/dL (ref 8.4–10.5)
Creatinine, Ser: 0.69 mg/dL (ref 0.50–1.10)
GFR calc non Af Amer: >90 mL/min (ref >90)
GLUCOSE: 121 mg/dL — AB (ref 70–99)
POTASSIUM: 4.2 meq/L (ref 3.7–5.3)
SODIUM: 140 meq/L (ref 137–147)
TOTAL PROTEIN: 7.5 g/dL (ref 6.0–8.3)

## 2013-04-16 LAB — APTT: APTT: 29 s (ref 24–37)

## 2013-04-16 LAB — CBC
HCT: 37.6 % (ref 36.0–46.0)
HEMOGLOBIN: 12.4 g/dL (ref 12.0–15.0)
MCH: 28.5 pg (ref 26.0–34.0)
MCHC: 33 g/dL (ref 30.0–36.0)
MCV: 86.4 fL (ref 78.0–100.0)
Platelets: 357 10*3/uL (ref 150–400)
RBC: 4.35 MIL/uL (ref 3.87–5.11)
RDW: 13.8 % (ref 11.5–15.5)
WBC: 8.1 10*3/uL (ref 4.0–10.5)

## 2013-04-16 LAB — PROTIME-INR
INR: 0.92 (ref 0.00–1.49)
Prothrombin Time: 12.2 seconds (ref 11.6–15.2)

## 2013-04-16 MED ORDER — LIDOCAINE HCL 2 % EX GEL: Freq: Once | CUTANEOUS | Status: DC

## 2013-04-16 MED ORDER — PHENYLEPHRINE HCL 0.25 % NA SOLN: 1.0000 | Freq: Four times a day (QID) | NASAL | Status: DC | PRN

## 2013-04-16 NOTE — Progress Notes (Signed)
Mrs Glacken states she has sleep apnea , wears CPAP.  She oes not remember where she was tested, but it has 5 or so years ago.

## 2013-04-16 NOTE — Pre-Procedure Instructions (Signed)
Christina Hayes  04/16/2013   Your procedure is scheduled on: Tuesday, April 2.  Report to [facility], Main Entrance [name]" at 5:30AM.  Call this number if you have problems the morning of surgery: [phone]   Remember:   Do not eat food or drink liquids after midnight Monday.    Take these medicines the morning of surgery with A SIP OF WATER: pantoprazole (PROTONIX). May use Inhaler and bring it to the hospital with you.    Do not wear jewelry, make-up or nail polish.  Do not wear lotions, powders, or perfumes.   Do not shave 48 hours prior to surgery.  Do not bring valuables to the hospital.  [facility] is not responsible for any belongings or valuables.               Contacts, dentures or bridgework may not be worn into surgery.  Leave suitcase in the car. After surgery it may be brought to your room.  For patients admitted to the hospital, discharge time is determined by your treatment team.               Patients discharged the day of surgery will not be allowed to drive home.  Name and phone number of your driver: -   Special Instructions: Review  [facility] - Preparing For Surgery.   Please read over the following fact sheets that you were given: Pain Booklet, Coughing and Deep Breathing and Surgical Site Infection Prevention

## 2013-04-22 MED ORDER — MIDAZOLAM HCL 5 MG/ML IJ SOLN: 1.0000 mg | Freq: Once | INTRAMUSCULAR | Status: DC

## 2013-04-22 MED ORDER — MEPERIDINE HCL 100 MG/ML IJ SOLN: 100.0000 mg | Freq: Once | INTRAMUSCULAR | Status: DC

## 2013-04-23 ENCOUNTER — Ambulatory Visit (HOSPITAL_COMMUNITY): Payer: 59 | Admitting: Certified Registered Nurse Anesthetist

## 2013-04-23 ENCOUNTER — Encounter (HOSPITAL_COMMUNITY)
Admission: RE | Disposition: A | Discharge status: Home / Self Care | Payer: Self-pay | Source: Ambulatory Visit | Attending: Thoracic Surgery (Cardiothoracic Vascular Surgery)

## 2013-04-23 ENCOUNTER — Ambulatory Visit (HOSPITAL_COMMUNITY): Payer: 59

## 2013-04-23 ENCOUNTER — Encounter (HOSPITAL_COMMUNITY): Payer: Self-pay | Admitting: *Deleted

## 2013-04-23 ENCOUNTER — Encounter (HOSPITAL_COMMUNITY): Payer: 59 | Admitting: Certified Registered Nurse Anesthetist

## 2013-04-23 ENCOUNTER — Ambulatory Visit (HOSPITAL_COMMUNITY)
Admission: RE | Admit: 2013-04-23 | Discharge: 2013-04-23 | Disposition: A | Discharge status: Home / Self Care | Payer: 59 | Source: Ambulatory Visit | Attending: Thoracic Surgery (Cardiothoracic Vascular Surgery) | Admitting: Thoracic Surgery (Cardiothoracic Vascular Surgery)

## 2013-04-23 DIAGNOSIS — E782 Mixed hyperlipidemia: Secondary | ICD-10-CM | POA: Insufficient documentation

## 2013-04-23 DIAGNOSIS — F17200 Nicotine dependence, unspecified, uncomplicated: Secondary | ICD-10-CM | POA: Insufficient documentation

## 2013-04-23 DIAGNOSIS — D3A090 Benign carcinoid tumor of the bronchus and lung: Secondary | ICD-10-CM | POA: Insufficient documentation

## 2013-04-23 DIAGNOSIS — R32 Unspecified urinary incontinence: Secondary | ICD-10-CM | POA: Insufficient documentation

## 2013-04-23 DIAGNOSIS — D491 Neoplasm of unspecified behavior of respiratory system: Secondary | ICD-10-CM

## 2013-04-23 DIAGNOSIS — K219 Gastro-esophageal reflux disease without esophagitis: Secondary | ICD-10-CM | POA: Insufficient documentation

## 2013-04-23 DIAGNOSIS — K449 Diaphragmatic hernia without obstruction or gangrene: Secondary | ICD-10-CM | POA: Insufficient documentation

## 2013-04-23 DIAGNOSIS — N3944 Nocturnal enuresis: Secondary | ICD-10-CM | POA: Insufficient documentation

## 2013-04-23 DIAGNOSIS — J984 Other disorders of lung: Secondary | ICD-10-CM

## 2013-04-23 DIAGNOSIS — G43909 Migraine, unspecified, not intractable, without status migrainosus: Secondary | ICD-10-CM | POA: Insufficient documentation

## 2013-04-23 DIAGNOSIS — R3915 Urgency of urination: Secondary | ICD-10-CM | POA: Insufficient documentation

## 2013-04-23 HISTORY — PX: RIGID BRONCHOSCOPY: SHX5069

## 2013-04-23 HISTORY — PX: VIDEO BRONCHOSCOPY: SHX5072

## 2013-04-23 SURGERY — BRONCHOSCOPY, VIDEO-ASSISTED
Anesthesia: General

## 2013-04-23 MED ORDER — EPHEDRINE SULFATE 50 MG/ML IJ SOLN
INTRAMUSCULAR | Status: AC
  Filled 2013-04-23: qty 1

## 2013-04-23 MED ORDER — LIDOCAINE HCL (CARDIAC) 20 MG/ML IV SOLN
INTRAVENOUS | Status: AC
  Filled 2013-04-23: qty 10

## 2013-04-23 MED ORDER — ROCURONIUM BROMIDE 50 MG/5ML IV SOLN
INTRAVENOUS | Status: AC
  Filled 2013-04-23: qty 1

## 2013-04-23 MED ORDER — NEOSTIGMINE METHYLSULFATE 1 MG/ML IJ SOLN
INTRAMUSCULAR | Status: DC | PRN
  Administered 2013-04-23: 4 mg via INTRAVENOUS

## 2013-04-23 MED ORDER — PROPOFOL 10 MG/ML IV BOLUS
INTRAVENOUS | Status: AC
  Filled 2013-04-23: qty 20

## 2013-04-23 MED ORDER — FENTANYL CITRATE 0.05 MG/ML IJ SOLN
INTRAMUSCULAR | Status: DC | PRN
  Administered 2013-04-23: 100 ug via INTRAVENOUS

## 2013-04-23 MED ORDER — HYDROMORPHONE HCL PF 1 MG/ML IJ SOLN: 0.2500 mg | INTRAMUSCULAR | Status: DC | PRN

## 2013-04-23 MED ORDER — DEXAMETHASONE SODIUM PHOSPHATE 4 MG/ML IJ SOLN
INTRAMUSCULAR | Status: AC
  Filled 2013-04-23: qty 2

## 2013-04-23 MED ORDER — LIDOCAINE HCL (CARDIAC) 20 MG/ML IV SOLN
INTRAVENOUS | Status: DC | PRN
  Administered 2013-04-23: 40 mg via INTRAVENOUS

## 2013-04-23 MED ORDER — ONDANSETRON HCL 4 MG/2ML IJ SOLN
INTRAMUSCULAR | Status: AC
  Filled 2013-04-23: qty 2

## 2013-04-23 MED ORDER — STERILE WATER FOR INJECTION IJ SOLN
INTRAMUSCULAR | Status: AC
  Filled 2013-04-23: qty 10

## 2013-04-23 MED ORDER — NEOSTIGMINE METHYLSULFATE 1 MG/ML IJ SOLN
INTRAMUSCULAR | Status: AC
  Filled 2013-04-23: qty 10

## 2013-04-23 MED ORDER — DEXTROSE 5 % IV SOLN
1.5000 g | INTRAVENOUS | Status: DC | PRN
  Administered 2013-04-23: 1.5 g via INTRAVENOUS

## 2013-04-23 MED ORDER — LIDOCAINE HCL 4 % MT SOLN
OROMUCOSAL | Status: DC | PRN
  Administered 2013-04-23: 4 mL via TOPICAL

## 2013-04-23 MED ORDER — 0.9 % SODIUM CHLORIDE (POUR BTL) OPTIME
TOPICAL | Status: DC | PRN
  Administered 2013-04-23: 1000 mL

## 2013-04-23 MED ORDER — DEXAMETHASONE SODIUM PHOSPHATE 4 MG/ML IJ SOLN
INTRAMUSCULAR | Status: DC | PRN
  Administered 2013-04-23: 8 mg via INTRAVENOUS

## 2013-04-23 MED ORDER — EPINEPHRINE HCL 1 MG/ML IJ SOLN
INTRAMUSCULAR | Status: AC
  Filled 2013-04-23: qty 1

## 2013-04-23 MED ORDER — ONDANSETRON HCL 4 MG/2ML IJ SOLN: 4.0000 mg | Freq: Once | INTRAMUSCULAR | Status: DC | PRN

## 2013-04-23 MED ORDER — MIDAZOLAM HCL 2 MG/2ML IJ SOLN
INTRAMUSCULAR | Status: AC
  Filled 2013-04-23: qty 2

## 2013-04-23 MED ORDER — PROPOFOL 10 MG/ML IV BOLUS
INTRAVENOUS | Status: DC | PRN
  Administered 2013-04-23: 150 mg via INTRAVENOUS

## 2013-04-23 MED ORDER — ROCURONIUM BROMIDE 100 MG/10ML IV SOLN
INTRAVENOUS | Status: DC | PRN
  Administered 2013-04-23: 40 mg via INTRAVENOUS

## 2013-04-23 MED ORDER — SUCCINYLCHOLINE CHLORIDE 20 MG/ML IJ SOLN
INTRAMUSCULAR | Status: AC
  Filled 2013-04-23: qty 1

## 2013-04-23 MED ORDER — FENTANYL CITRATE 0.05 MG/ML IJ SOLN
INTRAMUSCULAR | Status: AC
  Filled 2013-04-23: qty 5

## 2013-04-23 MED ORDER — LACTATED RINGERS IV SOLN
INTRAVENOUS | Status: DC | PRN
  Administered 2013-04-23 (×2): via INTRAVENOUS

## 2013-04-23 MED ORDER — ONDANSETRON HCL 4 MG/2ML IJ SOLN
INTRAMUSCULAR | Status: DC | PRN
  Administered 2013-04-23: 4 mg via INTRAVENOUS

## 2013-04-23 MED ORDER — GLYCOPYRROLATE 0.2 MG/ML IJ SOLN
INTRAMUSCULAR | Status: DC | PRN
  Administered 2013-04-23: .6 mg via INTRAVENOUS

## 2013-04-23 MED ORDER — DEXTROSE 5 % IV SOLN
INTRAVENOUS | Status: AC
  Filled 2013-04-23: qty 1.5

## 2013-04-23 MED ORDER — MIDAZOLAM HCL 5 MG/5ML IJ SOLN
INTRAMUSCULAR | Status: DC | PRN
  Administered 2013-04-23: 2 mg via INTRAVENOUS

## 2013-04-23 MED ORDER — PHENYLEPHRINE HCL 10 MG/ML IJ SOLN
10.0000 mg | INTRAVENOUS | Status: DC | PRN
  Administered 2013-04-23: 20 ug/min via INTRAVENOUS

## 2013-04-23 MED ORDER — EPINEPHRINE HCL 1 MG/ML IJ SOLN
INTRAMUSCULAR | Status: DC | PRN
  Administered 2013-04-23: 1 mg via ENDOTRACHEOPULMONARY

## 2013-04-23 MED ORDER — GLYCOPYRROLATE 0.2 MG/ML IJ SOLN
INTRAMUSCULAR | Status: AC
  Filled 2013-04-23: qty 3

## 2013-04-23 SURGICAL SUPPLY — 40 items
BANDAGE GAUZE ELAST BULKY 4 IN (GAUZE/BANDAGES/DRESSINGS) IMPLANT
BRUSH CYTOL CELLEBRITY 1.5X140 (MISCELLANEOUS) IMPLANT
CANISTER SUCTION 2500CC (MISCELLANEOUS) ×3 IMPLANT
CONT SPEC 4OZ CLIKSEAL STRL BL (MISCELLANEOUS) ×15 IMPLANT
COTTONBALL LRG STERILE PKG (GAUZE/BANDAGES/DRESSINGS) IMPLANT
COVER TABLE BACK 60X90 (DRAPES) ×3 IMPLANT
DRAPE INCISE IOBAN 66X45 STRL (DRAPES) IMPLANT
FILTER STRAW FLUID ASPIR (MISCELLANEOUS) ×3 IMPLANT
FORCEPS BIOP RJ4 1.8 (CUTTING FORCEPS) ×3 IMPLANT
GAS CARTRIDGE  LASER (MISCELLANEOUS) ×3 IMPLANT
GAUZE VASELINE FOILPK 1/2 X 72 (GAUZE/BANDAGES/DRESSINGS) IMPLANT
GLOVE EUDERMIC 7 POWDERFREE (GLOVE) ×3 IMPLANT
GLOVE SURG SIGNA 7.5 PF LTX (GLOVE) ×3 IMPLANT
GOWN STRL REUS W/ TWL LRG LVL3 (GOWN DISPOSABLE) ×1 IMPLANT
GOWN STRL REUS W/TWL LRG LVL3 (GOWN DISPOSABLE) ×2
KIT BASIN OR (CUSTOM PROCEDURE TRAY) ×3 IMPLANT
KIT ROOM TURNOVER OR (KITS) ×3 IMPLANT
LASER FIBER FLEXIBLE (MISCELLANEOUS) ×3 IMPLANT
NEEDLE 22X1 1/2 (OR ONLY) (NEEDLE) IMPLANT
NEEDLE BIOPSY TRANSBRONCH 21G (NEEDLE) IMPLANT
NEEDLE BLUNT 16X1.5 OR ONLY (NEEDLE) ×3 IMPLANT
NEEDLE BLUNT 18X1 FOR OR ONLY (NEEDLE) ×3 IMPLANT
NS IRRIG 1000ML POUR BTL (IV SOLUTION) ×3 IMPLANT
PAD ARMBOARD 7.5X6 YLW CONV (MISCELLANEOUS) ×6 IMPLANT
PAD EYE OVAL STERILE LF (GAUZE/BANDAGES/DRESSINGS) ×6 IMPLANT
PROBE ROUND (MISCELLANEOUS) ×3 IMPLANT
SOLUTION ANTI FOG 6CC (MISCELLANEOUS) ×3 IMPLANT
SPONGE GAUZE 4X4 12PLY (GAUZE/BANDAGES/DRESSINGS) ×3 IMPLANT
SYR 20ML ECCENTRIC (SYRINGE) ×3 IMPLANT
SYR 3ML LL SCALE MARK (SYRINGE) ×3 IMPLANT
SYR 5ML LL (SYRINGE) ×3 IMPLANT
SYR 5ML LUER SLIP (SYRINGE) ×3 IMPLANT
SYR CONTROL 10ML LL (SYRINGE) IMPLANT
SYR TOOMEY 50ML (SYRINGE) IMPLANT
TOWEL OR 17X24 6PK STRL BLUE (TOWEL DISPOSABLE) ×3 IMPLANT
TOWEL OR 17X26 10 PK STRL BLUE (TOWEL DISPOSABLE) ×3 IMPLANT
TRAP SPECIMEN MUCOUS 40CC (MISCELLANEOUS) ×6 IMPLANT
TUBE CONNECTING 12'X1/4 (SUCTIONS) ×1
TUBE CONNECTING 12X1/4 (SUCTIONS) ×2 IMPLANT
WATER STERILE IRR 1000ML POUR (IV SOLUTION) IMPLANT

## 2013-04-23 NOTE — Transfer of Care (Signed)
Immediate Anesthesia Transfer of Care Note  Patient: Christina Hayes  Procedure(s) Performed: Procedure(s): VIDEO BRONCHOSCOPY (N/A) LASER BRONCHOSCOPY (N/A)  Patient Location: PACU  Anesthesia Type:General  Level of Consciousness: awake, alert  and oriented  Airway & Oxygen Therapy: Patient Spontanous Breathing and Patient connected to nasal cannula oxygen  Post-op Assessment: Report given to PACU RN, Post -op Vital signs reviewed and stable and Patient moving all extremities X 4  Post vital signs: Reviewed and stable  Complications: No apparent anesthesia complications

## 2013-04-23 NOTE — Brief Op Note (Addendum)
04/23/2013  9:17 AM  PATIENT:  Christina Hayes  63 y.o. female  PRE-OPERATIVE DIAGNOSIS:  left mainstem bronchial mass  POST-OPERATIVE DIAGNOSIS:  left mainstem bronchial mass- carcinoid tumor  PROCEDURE:  Procedure(s): VIDEO BRONCHOSCOPY (N/A) LASER BRONCHOSCOPY (N/A)  SURGEON:  Surgeon(s) and Role:    * Melrose Nakayama, MD - Primary  PHYSICIAN ASSISTANT:   ASSISTANTS: none   ANESTHESIA:   general  EBL:  Total I/O In: 1000 [I.V.:1000] Out: -   BLOOD ADMINISTERED:none   SPECIMEN:  Source of Specimen:  left main stem bronchial mass  DISPOSITION OF SPECIMEN:  PATHOLOGY   PLAN OF CARE: Discharge to home after PACU  PATIENT DISPOSITION:  PACU - hemodynamically stable.   Delay start of Pharmacological VTE agent (>24hrs) due to surgical blood loss or risk of bleeding: not applicable  FINDINGS: Pedunculated mass arising from lateral aspect of distal left main stem bronchus. Frozen showed carcinoid tumor

## 2013-04-23 NOTE — Interval H&P Note (Signed)
History and Physical Interval Note:  04/23/2013 7:41 AM  Christina Hayes  has presented today for surgery, with the diagnosis of left mainstem bronchial mass  The various methods of treatment have been discussed with the patient and family. After consideration of risks, benefits and other options for treatment, the patient has consented to  Procedure(s): VIDEO BRONCHOSCOPY (N/A) LASER BRONCHOSCOPY (N/A) as a surgical intervention .  The patient's history has been reviewed, patient examined, no change in status, stable for surgery.  I have reviewed the patient's chart and labs.  Questions were answered to the patient's satisfaction.     Garry Nicolini C

## 2013-04-23 NOTE — H&P (View-Only) (Signed)
PCP is Adella Hare, MD Referring Provider is Tanda Rockers, MD  Chief Complaint  Patient presents with  . Mediastinal Mass    Surgical eval, PET Scan 04/09/2013, Chest CT 02/16/13    HPI: Christina Hayes is a 63 year old woman who presents for evaluation of an endobronchial mass.  Christina Hayes says that she's been having difficulty with frequent cough, wheezing, and repeated bronchitis over the past several years. She is a lifelong nonsmoker and had no history of childhood asthma. Several years ago, approximately 5, she began having episodes of wheezing. She also developed a frequent cough. She says that her symptoms became much worse about 5 months ago. Since then she's had nearly continuous exacerbations of her symptoms. She has been treated with bronchodilators and antibiotics. She says that her symptoms will get better for a while but recurred rapidly.  She had a CT done in January which showed a endobronchial mass in the left mainstem bronchus. She was referred to Dr. Melvyn Novas. On March 5 he did a bronchoscopy where he found and endobronchial mass in the left mainstem which was nearly completely obstructing the bronchus. It was not biopsied due to risk of bleeding. Cytologies were benign.  She then had a PET/CT done on March 19 which showed the lesion to be mildly hypermetabolic.  Of note she also has a 14 mm groundglass opacity in the right upper lobe.  She is not currently experiencing fevers or chills. She does have her usual cough.  Past Medical History  Diagnosis Date  . HYPERLIPIDEMIA, MIXED 12/03/2006    PT STATES NOT TAKING MED AS PRESCRIBED  . VERTIGO, HX OF 12/03/2006  . MIGRAINES, HX OF 12/03/2006  . Allergic rhinitis, cause unspecified   . SUI (stress urinary incontinence, female)   . OSA on CPAP   . Head cold   . Cough   . Frequency of urination   . Urgency of urination   . Nocturia   . Acid reflux   . Complication of anesthesia SLOW TO WAKE    Past Surgical History   Procedure Laterality Date  . Salpingoophorectomy  1995    BILATERAL  . Vaginal hysterectomy  1988  . Pubovaginal sling  04/30/2011    Procedure: Gaynelle Arabian;  Surgeon: Bernestine Amass, MD;  Location: [facility];  Service: Urology;  Laterality: N/A;  sub urethral sling    possible ower  . Video bronchoscopy Bilateral 03/26/2013    Procedure: VIDEO BRONCHOSCOPY WITHOUT FLUORO;  Surgeon: Tanda Rockers, MD;  Location: [facility];  Service: Cardiopulmonary;  Laterality: Bilateral;    Family History  Problem Relation Age of Onset  . Cancer Mother     Colon Cancer survivor  . Diabetes Mother   . Diabetes Father   . Hyperlipidemia Father   . Alzheimer's disease Father   . Cancer Maternal Grandmother     Breast Cancer  . Cancer Daughter 74    stage III-B breast CA  . Allergies Daughter     Social History History  Substance Use Topics  . Smoking status: Never Smoker   . Smokeless tobacco: Never Used  . Alcohol Use: No    Current Outpatient Prescriptions  Medication Sig Dispense Refill  . diphenhydramine-acetaminophen (TYLENOL PM) 25-500 MG TABS Take 1 tablet by mouth at bedtime as needed.      . famotidine (PEPCID) 20 MG tablet Take 20 mg by mouth at bedtime.       . montelukast (SINGULAIR) 10 MG tablet  One at bedtime every night      . pantoprazole (PROTONIX) 40 MG tablet Take 1 tablet (40 mg total) by mouth daily. Take 30-60 min before first meal of the day  30 tablet  2  . simvastatin (ZOCOR) 20 MG tablet Take 20 mg by mouth daily.       No current facility-administered medications for this visit.    Allergies  Allergen Reactions  . Codeine Itching  . Tramadol     headache    Review of Systems  Constitutional: Positive for fatigue and unexpected weight change (weight gain).  HENT: Positive for congestion and postnasal drip.   Respiratory: Positive for apnea (uses CPAP), cough, shortness of breath (with exertion) and wheezing.   Cardiovascular:  Negative for chest pain.  Gastrointestinal: Positive for diarrhea.       Heart burn, reflux, hiatal hernia  Skin:       itching  Neurological: Positive for numbness (in hands) and headaches.  Hematological: Bruises/bleeds easily.  All other systems reviewed and are negative.    BP 114/63  Pulse 72  Resp 20  Ht 5\' 2"  (5.284 m)  Wt 181 lb (82.101 kg)  BMI 33.10 kg/m2  SpO2 98% Physical Exam  Vitals reviewed. Constitutional: She is oriented to person, place, and time. She appears well-developed and well-nourished. No distress.  HENT:  Head: Normocephalic and atraumatic.  Eyes: EOM are normal. Pupils are equal, round, and reactive to light.  Neck: Neck supple. No thyromegaly present.  Cardiovascular: Normal rate and regular rhythm.  Exam reveals no gallop and no friction rub.   No murmur heard. Pulmonary/Chest: Effort normal. She has wheezes (faint left side).  Abdominal: Soft. There is no tenderness.  Musculoskeletal: She exhibits no edema.  Lymphadenopathy:    She has no cervical adenopathy.  Neurological: She is alert and oriented to person, place, and time. No cranial nerve deficit.  Skin: Skin is warm and dry.     Diagnostic Tests: CT CHEST WITH CONTRAST  TECHNIQUE:  Multidetector CT imaging of the chest was performed during  intravenous contrast administration.  CONTRAST: 53mL OMNIPAQUE IOHEXOL 300 MG/ML SOLN  FINDINGS:  There is no axillary lymphadenopathy. No mediastinal or hilar  lymphadenopathy. Heart size is normal. No pericardial or pleural  effusion.  No pulmonary nodule or mass in the right lung. There is no focal  airspace consolidation in the right lung. No evidence for  bronchiectasisa. A 14 mm area of subtle ground-glass attenuation is  identified in the right upper lobe (see image 20 series 3).  No focal airspace consolidation in the left lung. No pulmonary  nodular mass in the left lung.  The patient does have a 12 x 9 mm nodule in the left  mainstem  bronchus which may arise from the posterior wall, just proximal to  the bifurcation of the upper and lower lobe airways.  Bone windows reveal no worrisome lytic or sclerotic osseous lesions.  Imaging of the upper abdomen shows a tiny hiatal hernia, but is  otherwise unremarkable.  IMPRESSION:  12 x 9 mm intraluminal soft tissue density nodule in the left  mainstem bronchus. Given aspirated foreign body more typically ends  up in the right mainstem bronchus, a true in a bronchial soft tissue  lesion is suspected. Consider bronchoscopy to exclude neoplasm.  14 mm area subtle ground-glass attenuation in the right upper lobe.  Initial follow-up CT without contrast in 3 months is recommended to  confirm persistence. The This recommendation follows  the consensus  statement: Recommendations for the Management of Subsolid Pulmonary  Nodules Detected at CT: A Statement from the [facility] as  published in Radiology 2013; 266:304-317.  I discussed these results with the practitioner, Tammy Parrett, at  1457 hr on 02/16/2013.  Electronically Signed  By: Misty Stanley M.D.  On: 02/16/2013 14:58   NUCLEAR MEDICINE PET SKULL BASE TO THIGH  TECHNIQUE:  10 MCi F-18 FDG was injected intravenously. Full-ring PET imaging  was performed from the skull base to thigh after the radiotracer. CT  data was obtained and used for attenuation correction and anatomic  localization.  FASTING BLOOD GLUCOSE: Value: 104 mg/dl  COMPARISON: Biopsy results of 03/26/2012. . Chest CT 02/16/2013.  FINDINGS:  NECK  No areas of abnormal hypermetabolism.  CHEST  The left upper lobe endobronchial lesion demonstrates low level,  nonspecific metabolism. This is similar to the surrounding  mediastinal pool. Measures 1.3 x 0 0.9 cm and a S.U.V. max of 2.8 on  image 30/series 8. No thoracic nodal or pulmonary parenchymal  hypermetabolism identified.  ABDOMEN/PELVIS  No areas of abnormal hypermetabolism.   SKELETON  Mild soft tissue thickening and hypermetabolism about the proximal  right femur. Favored to be degenerative or due to muscular strain.  CT IMAGES PERFORMED FOR ATTENUATION CORRECTION  No significant findings within the neck. Mucosal thickening of  bilateral maxillary sinuses.  Small hiatal hernia. Right upper lobe ground-glass opacity is not  well evaluated on this nondedicated CT. Hysterectomy.  IMPRESSION:  1. Left upper lobe endobronchial lesion with low-level nonspecific  hypermetabolism. This could represent a benign lesion or low grade  malignancy.  2. No evidence of pulmonary parenchymal or nodal hypermetabolic  disease.  Electronically Signed  By: Abigail Miyamoto M.D.  On: 04/09/2013 12:41   Impression: 63 year old nonsmoker with a left mainstem bronchial mass. This is nearly completely obstructing the bronchus and is causing a wheezing and relapsing bronchitis. This mass needs to be resected. The mass is pedunculated and is likely a benign tumor. It does appear to be amenable to endobronchial resection.  I discussed the proposed procedure with Mr. and Mrs. Hast in detail. I recommended bronchoscopic resection of the tumor. This will likely require combination of flexible and rigid bronchoscopy and laser bronchoscopy. We discussed the general nature of the procedure, the need to do this in the operating room under general anesthesia, and the likelihood that this can be done on an outpatient basis. They do understand with this approach that recurrence is possible and she will need to be monitored for that possibility.  We discussed the indications, risks, benefits, and alternatives. She understands the risk include those associated with general anesthesia. She understands the procedural risks include, but are not limited to death, MI, DVT, PE, bleeding, possible need for transfusion, possible need for thoracotomy, bronchial injury, pneumothorax, as well as the possibility of  unforeseeable complications. She accepts the risks and wishes to proceed.  Plan: Bronchoscopic resection of left mainstem endobronchial mass on Thursday, April 2

## 2013-04-23 NOTE — Anesthesia Procedure Notes (Signed)
Procedure Name: Intubation Date/Time: 04/23/2013 8:14 AM Performed by: Blair Heys E Pre-anesthesia Checklist: Patient identified, Emergency Drugs available, Suction available and Patient being monitored Patient Re-evaluated:Patient Re-evaluated prior to inductionOxygen Delivery Method: Circle system utilized Preoxygenation: Pre-oxygenation with 100% oxygen Intubation Type: IV induction Ventilation: Mask ventilation without difficulty and Oral airway inserted - appropriate to patient size Laryngoscope Size: Sabra Heck and 2 Grade View: Grade I Tube type: Oral Tube size: 8.5 mm Number of attempts: 1 Airway Equipment and Method: Stylet and Oral airway Placement Confirmation: ETT inserted through vocal cords under direct vision,  positive ETCO2 and breath sounds checked- equal and bilateral Secured at: 20 cm Tube secured with: Tape Dental Injury: Teeth and Oropharynx as per pre-operative assessment

## 2013-04-23 NOTE — Anesthesia Preprocedure Evaluation (Addendum)
Anesthesia Evaluation  Patient identified by MRN, date of birth, ID band Patient awake    Reviewed: Allergy & Precautions, H&P , NPO status , Patient's Chart, lab work & pertinent test results  History of Anesthesia Complications (+) PONV and history of anesthetic complications  Airway Mallampati: III TM Distance: >3 FB Neck ROM: Full    Dental  (+) Dental Advisory Given, Teeth Intact   Pulmonary shortness of breath and with exertion, sleep apnea and Continuous Positive Airway Pressure Ventilation ,  breath sounds clear to auscultation        Cardiovascular Rhythm:Regular Rate:Normal     Neuro/Psych  Headaches,    GI/Hepatic hiatal hernia, GERD-  Controlled,  Endo/Other  Morbid obesity  Renal/GU      Musculoskeletal   Abdominal   Peds  Hematology   Anesthesia Other Findings   Reproductive/Obstetrics                       Anesthesia Physical Anesthesia Plan  ASA: III  Anesthesia Plan: General   Post-op Pain Management:    Induction: Intravenous  Airway Management Planned: Oral ETT  Additional Equipment:   Intra-op Plan:   Post-operative Plan: Extubation in OR  Informed Consent: I have reviewed the patients History and Physical, chart, labs and discussed the procedure including the risks, benefits and alternatives for the proposed anesthesia with the patient or authorized representative who has indicated his/her understanding and acceptance.   Dental advisory given  Plan Discussed with: CRNA, Anesthesiologist and Surgeon  Anesthesia Plan Comments: (L. Mainstem bronchial mass GERD  Plan GA with oral ETT  Roberts Gaudy, MD)       Anesthesia Quick Evaluation

## 2013-04-23 NOTE — Anesthesia Postprocedure Evaluation (Signed)
  Anesthesia Post-op Note  Patient: Christina Hayes  Procedure(s) Performed: Procedure(s): VIDEO BRONCHOSCOPY (N/A) LASER BRONCHOSCOPY (N/A)  Patient Location: PACU  Anesthesia Type:General  Level of Consciousness: awake, alert  and oriented  Airway and Oxygen Therapy: Patient Spontanous Breathing  Post-op Pain: mild  Post-op Assessment: Post-op Vital signs reviewed, Patient's Cardiovascular Status Stable, Respiratory Function Stable, Patent Airway, No signs of Nausea or vomiting and Pain level controlled  Post-op Vital Signs: stable  Complications: No apparent anesthesia complications

## 2013-04-23 NOTE — Discharge Instructions (Signed)
What to eat:  For your first meals, you should eat lightly; only small meals initially.  If you do not have nausea, you may eat larger meals.  Avoid spicy, greasy and heavy food.    General Anesthesia, Adult, Care After  Refer to this sheet in the next few weeks. These instructions provide you with information on caring for yourself after your procedure. Your health care provider may also give you more specific instructions. Your treatment has been planned according to current medical practices, but problems sometimes occur. Call your health care provider if you have any problems or questions after your procedure.  WHAT TO EXPECT AFTER THE PROCEDURE  After the procedure, it is typical to experience:  Sleepiness.  Nausea and vomiting. HOME CARE INSTRUCTIONS  For the first 24 hours after general anesthesia:  Have a responsible person with you.  Do not drive a car. If you are alone, do not take public transportation.  Do not drink alcohol.  Do not take medicine that has not been prescribed by your health care provider.  Do not sign important papers or make important decisions.  You may resume a normal diet and activities as directed by your health care provider.  Change bandages (dressings) as directed.  If you have questions or problems that seem related to general anesthesia, call the hospital and ask for the anesthetist or anesthesiologist on call. SEEK MEDICAL CARE IF:  You have nausea and vomiting that continue the day after anesthesia.  You develop a rash. SEEK IMMEDIATE MEDICAL CARE IF:  You have difficulty breathing.  You have chest pain.  You have any allergic problems. Document Released: 04/16/2000 Document Revised: 09/10/2012 Document Reviewed: 07/24/2012  [facility] Patient Information 2014 [place], [place].     You may cough up small amounts of blood over the next couple of days  Do not drive or engage in heavy physical activity for the next 24 hours  You may resume normal  activities tomorrow  Call [phone] to schedule a follow up appointment for 2 weeks

## 2013-04-24 NOTE — Op Note (Signed)
NAMEKASHAUNA, CELMER                ACCOUNT NO.:  [id number]  MEDICAL RECORD NO.:  [id number]  LOCATION:  [facility]                         FACILITY:  [facility]  PHYSICIAN:  Revonda Standard. Roxan Hockey, M.D.DATE OF BIRTH:  09-03-1950  DATE OF PROCEDURE:  04/23/2013 DATE OF DISCHARGE:  04/23/2013                              OPERATIVE REPORT   PREOPERATIVE DIAGNOSIS:  Endobronchial lesion in the left main stem.  POSTOPERATIVE DIAGNOSIS:  Endobronchial lesion in the left main stem, carcinoid tumor.  PROCEDURE:  Video bronchoscopy, bronchoscopic resection of endobronchial mass with laser ablation.  SURGEON:  Revonda Standard. Roxan Hockey, M.D.  ASSISTANT:  None.  ANESTHESIA:  General.  FINDINGS:  A 1 cm pedunculated mass in the left mainstem bronchus arising from the lateral aspect of the main stem just above the bifurcation.  Frozen section revealed carcinoid tumor.  CLINICAL NOTE:  Ms. Borgwardt is a 63 year old woman who has had problems with recurrent bronchitis.  A CT revealed an endobronchial mass in the left main stem bronchus.  She had undergone bronchoscopy by Dr. Christinia Gully, which confirmed the presence of a mass.  It was nearly obstructing the mainstem bronchus and she was referred for surgical consideration.  The tumor appeared to be amenable to endobronchial resection.  The indications, risks, benefits, and alternatives were discussed in detail with the patient.  She understood and accepted the risks and agreed to proceed.  OPERATIVE NOTE:  Ms. Akkerman was brought to the operating room on April 23, 2013.  She had induction of general endotracheal anesthesia and was intubated.  Flexible fiberoptic bronchoscopy was performed via the endotracheal tube.  The trachea and right bronchial tree were within normal limits.  In the left main stem bronchus distally, there was a pedunculated tumor that was nearly completely obstructing the bronchus that prolapsed with respirations.  The bronchoscope was  able to pass this and there were no other endobronchial lesions to the level of subsegmental bronchi in the upper or lower lobe.  The mass was arising from a stalk on the lateral aspect of the left main stem bronchus, approximately 1.5-2 cm above the bifurcation.  A snare was advanced around the mass and then cautery was applied as the snare was tightened, which transected the stalk of the tumor.  The tumor then was grasped with biopsy forceps and removed it via the endotracheal tube, was approximately 1.2 cm x 7 mm in size.  It was sent for frozen section, which returned benign carcinoid neuroendocrine tumor.  The base of the tumor then was inspected.  The transection of the stalk was nearly flush with the left mainstem bronchus.  The FiO2 was lowered to 21% and when the measured oxygen concentrations were below 25%. The laser probe was passed through the bronchoscope. A ND-YAG contact probe was used to ablate the base of the tumor, multiple firings were performed with 20 watts.  There was no bleeding.  The bronchoscope was removed.  The patient was extubated in the operating room and taken to the postanesthetic care unit in good condition.     Revonda Standard Roxan Hockey, M.D.     SCH/MEDQ  D:  04/23/2013  T:  04/24/2013  Job:  [id number] 

## 2013-04-27 ENCOUNTER — Encounter (HOSPITAL_COMMUNITY): Payer: Self-pay | Admitting: Thoracic Surgery (Cardiothoracic Vascular Surgery)

## 2013-04-28 ENCOUNTER — Other Ambulatory Visit: Payer: Self-pay | Admitting: *Deleted

## 2013-04-28 DIAGNOSIS — R918 Other nonspecific abnormal finding of lung field: Secondary | ICD-10-CM

## 2013-04-30 ENCOUNTER — Encounter: Payer: Self-pay | Admitting: Gastroenterology

## 2013-05-05 ENCOUNTER — Ambulatory Visit (INDEPENDENT_AMBULATORY_CARE_PROVIDER_SITE_OTHER): Payer: Self-pay | Admitting: Thoracic Surgery (Cardiothoracic Vascular Surgery)

## 2013-05-05 ENCOUNTER — Encounter: Payer: Self-pay | Admitting: Thoracic Surgery (Cardiothoracic Vascular Surgery)

## 2013-05-05 ENCOUNTER — Ambulatory Visit
Admission: RE | Admit: 2013-05-05 | Discharge: 2013-05-05 | Disposition: A | Discharge status: Home / Self Care | Payer: 59 | Source: Ambulatory Visit | Attending: Thoracic Surgery (Cardiothoracic Vascular Surgery) | Admitting: Thoracic Surgery (Cardiothoracic Vascular Surgery)

## 2013-05-05 VITALS — BP 121/73 | HR 75 | Resp 16 | Ht 62.0 in | Wt 181.0 lb

## 2013-05-05 DIAGNOSIS — R918 Other nonspecific abnormal finding of lung field: Secondary | ICD-10-CM

## 2013-05-05 DIAGNOSIS — D491 Neoplasm of unspecified behavior of respiratory system: Secondary | ICD-10-CM

## 2013-05-05 DIAGNOSIS — Z09 Encounter for follow-up examination after completed treatment for conditions other than malignant neoplasm: Secondary | ICD-10-CM

## 2013-05-05 DIAGNOSIS — R222 Localized swelling, mass and lump, trunk: Secondary | ICD-10-CM

## 2013-05-05 NOTE — Progress Notes (Signed)
HPI:  Christina Hayes is a 63 year old woman who underwent an endobronchial resection of the left mainstem carcinoid tumor on April 3. She noticed an immediate improvement in her breathing in resolution of her wheezing even in the recovery room. She has continued to do well since discharge.  Past Medical History  Diagnosis Date  . HYPERLIPIDEMIA, MIXED 12/03/2006    PT STATES NOT TAKING MED AS PRESCRIBED  . VERTIGO, HX OF 12/03/2006  . MIGRAINES, HX OF 12/03/2006  . Allergic rhinitis, cause unspecified   . SUI (stress urinary incontinence, female)   . OSA on CPAP   . Head cold   . Cough   . Frequency of urination   . Urgency of urination   . Nocturia   . Acid reflux   . Complication of anesthesia SLOW TO WAKE  . PONV (postoperative nausea and vomiting)     in past  . Headache(784.0)     migraqine- uses essential oils  . Shortness of breath   . H/O hiatal hernia     small  . DJD (degenerative joint disease)     spine     Current Outpatient Prescriptions  Medication Sig Dispense Refill  . acetaminophen (TYLENOL) 500 MG tablet Take 1,000 mg by mouth every 6 (six) hours as needed.      Marland Kitchen albuterol (PROVENTIL HFA;VENTOLIN HFA) 108 (90 BASE) MCG/ACT inhaler Inhale 1-2 puffs into the lungs every 6 (six) hours as needed for wheezing or shortness of breath.      . chlorpheniramine (CHLOR-TRIMETON) 4 MG tablet Take 4 mg by mouth 2 (two) times daily as needed for allergies.      . diphenhydramine-acetaminophen (TYLENOL PM) 25-500 MG TABS Take 1 tablet by mouth at bedtime as needed (sleep).       . famotidine (PEPCID) 20 MG tablet Take 20 mg by mouth at bedtime.       Marland Kitchen ibuprofen (ADVIL,MOTRIN) 100 MG chewable tablet Chew 400 mg by mouth every 8 (eight) hours as needed.      . pantoprazole (PROTONIX) 40 MG tablet Take 1 tablet (40 mg total) by mouth daily. Take 30-60 min before first meal of the day  90 tablet  3  . simvastatin (ZOCOR) 40 MG tablet Take 40 mg by mouth daily.       No  current facility-administered medications for this visit.    Physical Exam BP 121/73  Pulse 75  Resp 16  Ht 5\' 2"  (1.575 m)  Wt 181 lb (82.101 kg)  BMI 33.10 kg/m2  SpO10 52%  63 year old woman in no acute distress Lungs clear with equal breath sounds bilaterally  Diagnostic Tests:  Chest x-ray CHEST 2 VIEW  COMPARISON: DG CHEST 2 VIEW dated 04/23/2013; NM PET IMAGE INITIAL  (PI) SKULL BASE TO THIGH dated 04/09/2013; CT CHEST W/CM dated  02/16/2013  FINDINGS:  Midline trachea. Normal heart size and mediastinal contours. No  pleural effusion or pneumothorax. Clear lungs.  IMPRESSION:  No acute cardiopulmonary disease.  Electronically Signed  By: Abigail Miyamoto M.D.  On: 05/05/2013 11:43  Impression:  63 year old woman status post endobronchial resection of a left mainstem carcinoid tumor. This was a low-grade tumor by pathology. She will need to continue to followup to rule out recurrence.  I recommended to her that we repeat a CT in 6 months to look at the left mainstem as well as to followup the groundglass opacity in the right upper lobe. We will also plan to do a bronchoscopy in 6 months  to do biopsies as well as potential laser ablation.  Plan:  Return in 6 months with CT of chest.  At that appointment we will discuss timing of the bronchoscopy

## 2013-08-17 ENCOUNTER — Ambulatory Visit (INDEPENDENT_AMBULATORY_CARE_PROVIDER_SITE_OTHER): Payer: 59

## 2013-08-17 ENCOUNTER — Other Ambulatory Visit: Payer: Self-pay | Admitting: Internal Medicine

## 2013-08-17 ENCOUNTER — Ambulatory Visit (INDEPENDENT_AMBULATORY_CARE_PROVIDER_SITE_OTHER): Payer: 59 | Admitting: Internal Medicine

## 2013-08-17 VITALS — BP 116/72 | HR 61 | Temp 98.2°F | Resp 16 | Ht 62.5 in | Wt 187.0 lb

## 2013-08-17 DIAGNOSIS — E785 Hyperlipidemia, unspecified: Secondary | ICD-10-CM

## 2013-08-17 DIAGNOSIS — M79644 Pain in right finger(s): Secondary | ICD-10-CM

## 2013-08-17 DIAGNOSIS — M79609 Pain in unspecified limb: Secondary | ICD-10-CM

## 2013-08-17 DIAGNOSIS — R609 Edema, unspecified: Secondary | ICD-10-CM

## 2013-08-17 DIAGNOSIS — Z79899 Other long term (current) drug therapy: Secondary | ICD-10-CM

## 2013-08-17 DIAGNOSIS — M25473 Effusion, unspecified ankle: Secondary | ICD-10-CM

## 2013-08-17 LAB — POCT UA - MICROSCOPIC ONLY
BACTERIA, U MICROSCOPIC: NEGATIVE
Casts, Ur, LPF, POC: NEGATIVE
Crystals, Ur, HPF, POC: NEGATIVE
Mucus, UA: NEGATIVE
RBC, urine, microscopic: NEGATIVE
WBC, UR, HPF, POC: NEGATIVE
YEAST UA: NEGATIVE

## 2013-08-17 LAB — POCT CBC
GRANULOCYTE PERCENT: 54.9 % (ref 37–80)
HEMATOCRIT: 42.4 % (ref 37.7–47.9)
HEMOGLOBIN: 13.7 g/dL (ref 12.2–16.2)
LYMPH, POC: 2.6 (ref 0.6–3.4)
MCH, POC: 27 pg (ref 27–31.2)
MCHC: 32.3 g/dL (ref 31.8–35.4)
MCV: 83.7 fL (ref 80–97)
MID (cbc): 0.4 (ref 0–0.9)
MPV: 7.2 fL (ref 0–99.8)
POC Granulocyte: 3.7 (ref 2–6.9)
POC LYMPH PERCENT: 38.8 %L (ref 10–50)
POC MID %: 6.3 % (ref 0–12)
Platelet Count, POC: 405 10*3/uL (ref 142–424)
RBC: 5.06 M/uL (ref 4.04–5.48)
RDW, POC: 14.7 %
WBC: 6.7 10*3/uL (ref 4.6–10.2)

## 2013-08-17 LAB — POCT URINALYSIS DIPSTICK
Bilirubin, UA: NEGATIVE
GLUCOSE UA: NEGATIVE
KETONES UA: NEGATIVE
NITRITE UA: NEGATIVE
Protein, UA: NEGATIVE
Spec Grav, UA: 1.015
Urobilinogen, UA: 0.2
pH, UA: 7

## 2013-08-17 MED ORDER — SIMVASTATIN 40 MG PO TABS: 40.0000 mg | ORAL_TABLET | Freq: Every day | ORAL | Status: DC

## 2013-08-17 NOTE — Patient Instructions (Signed)
Edema Edema is an abnormal buildup of fluids in your bodytissues. Edema is somewhatdependent on gravity to pull the fluid to the lowest place in your body. That makes the condition more common in the legs and thighs (lower extremities). Painless swelling of the feet and ankles is common and becomes more likely as you get older. It is also common in looser tissues, like around your eyes.  When the affected area is squeezed, the fluid may move out of that spot and leave a dent for a few moments. This dent is called pitting.  CAUSES  There are many possible causes of edema. Eating too much salt and being on your feet or sitting for a long time can cause edema in your legs and ankles. Hot weather may make edema worse. Common medical causes of edema include:  Heart failure.  Liver disease.  Kidney disease.  Weak blood vessels in your legs.  Cancer.  An injury.  Pregnancy.  Some medications.  Obesity. SYMPTOMS  Edema is usually painless.Your skin may look swollen or shiny.  DIAGNOSIS  Your health care provider may be able to diagnose edema by asking about your medical history and doing a physical exam. You may need to have tests such as X-rays, an electrocardiogram, or blood tests to check for medical conditions that may cause edema.  TREATMENT  Edema treatment depends on the cause. If you have heart, liver, or kidney disease, you need the treatment appropriate for these conditions. General treatment may include:  Elevation of the affected body part above the level of your heart.  Compression of the affected body part. Pressure from elastic bandages or support stockings squeezes the tissues and forces fluid back into the blood vessels. This keeps fluid from entering the tissues.  Restriction of fluid and salt intake.  Use of a water pill (diuretic). These medications are appropriate only for some types of edema. They pull fluid out of your body and make you urinate more often. This  gets rid of fluid and reduces swelling, but diuretics can have side effects. Only use diuretics as directed by your health care provider. HOME CARE INSTRUCTIONS   Keep the affected body part above the level of your heart when you are lying down.   Do not sit still or stand for prolonged periods.   Do not put anything directly under your knees when lying down.  Do not wear constricting clothing or garters on your upper legs.   Exercise your legs to work the fluid back into your blood vessels. This may help the swelling go down.   Wear elastic bandages or support stockings to reduce ankle swelling as directed by your health care provider.   Eat a low-salt diet to reduce fluid if your health care provider recommends it.   Only take medicines as directed by your health care provider. SEEK MEDICAL CARE IF:   Your edema is not responding to treatment.  You have heart, liver, or kidney disease and notice symptoms of edema.  You have edema in your legs that does not improve after elevating them.   You have sudden and unexplained weight gain. SEEK IMMEDIATE MEDICAL CARE IF:   You develop shortness of breath or chest pain.   You cannot breathe when you lie down.  You develop pain, redness, or warmth in the swollen areas.   You have heart, liver, or kidney disease and suddenly get edema.  You have a fever and your symptoms suddenly get worse. MAKE SURE YOU:     Understand these instructions.  Will watch your condition.  Will get help right away if you are not doing well or get worse. Document Released: 01/08/2005 Document Revised: 05/25/2013 Document Reviewed: 10/31/2012 [facility] Patient Information 2015 [place], [place]. This information is not intended to replace advice given to you by your health care provider. Make sure you discuss any questions you have with your health care provider. Osteoporosis Throughout your life, your body breaks down old bone and replaces it  with new bone. As you get older, your body does not replace bone as quickly as it breaks it down. By the age of 52 years, most people begin to gradually lose bone because of the imbalance between bone loss and replacement. Some people lose more bone than others. Bone loss beyond a specified normal degree is considered osteoporosis.  Osteoporosis affects the strength and durability of your bones. The inside of the ends of your bones and your flat bones, like the bones of your pelvis, look like honeycomb, filled with tiny open spaces. As bone loss occurs, your bones become less dense. This means that the open spaces inside your bones become bigger and the walls between these spaces become thinner. This makes your bones weaker. Bones of a person with osteoporosis can become so weak that they can break (fracture) during minor accidents, such as a simple fall. CAUSES  The following factors have been associated with the development of osteoporosis:  Smoking.  Drinking more than 2 alcoholic drinks several days per week.  Long-term use of certain medicines:  Corticosteroids.  Chemotherapy medicines.  Thyroid medicines.  Antiepileptic medicines.  Gonadal hormone suppression medicine.  Immunosuppression medicine.  Being underweight.  Lack of physical activity.  Lack of exposure to the sun. This can lead to vitamin D deficiency.  Certain medical conditions:  Certain inflammatory bowel diseases, such as Crohn disease and ulcerative colitis.  Diabetes.  Hyperthyroidism.  Hyperparathyroidism. RISK FACTORS Anyone can develop osteoporosis. However, the following factors can increase your risk of developing osteoporosis:  Gender--Women are at higher risk than men.  Age--Being older than 50 years increases your risk.  Ethnicity--White and Asian people have an increased risk.  Weight --Being extremely underweight can increase your risk of osteoporosis.  Family history of  osteoporosis--Having a family member who has developed osteoporosis can increase your risk. SYMPTOMS  Usually, people with osteoporosis have no symptoms.  DIAGNOSIS  Signs during a physical exam that may prompt your caregiver to suspect osteoporosis include:  Decreased height. This is usually caused by the compression of the bones that form your spine (vertebrae) because they have weakened and become fractured.  A curving or rounding of the upper back (kyphosis). To confirm signs of osteoporosis, your caregiver may request a procedure that uses 2 low-dose X-ray beams with different levels of energy to measure your bone mineral density (dual-energy X-ray absorptiometry [DXA]). Also, your caregiver may check your level of vitamin D. TREATMENT  The goal of osteoporosis treatment is to strengthen bones in order to decrease the risk of bone fractures. There are different types of medicines available to help achieve this goal. Some of these medicines work by slowing the processes of bone loss. Some medicines work by increasing bone density. Treatment also involves making sure that your levels of calcium and vitamin D are adequate. PREVENTION  There are things you can do to help prevent osteoporosis. Adequate intake of calcium and vitamin D can help you achieve optimal bone mineral density. Regular exercise can also help, especially  resistance and weight-bearing activities. If you smoke, quitting smoking is an important part of osteoporosis prevention. MAKE SURE YOU:  Understand these instructions.  Will watch your condition.  Will get help right away if you are not doing well or get worse. FOR MORE INFORMATION www.osteo.org and [employer].ee Document Released: 10/18/2004 Document Revised: 05/05/2012 Document Reviewed: 12/23/2010 [facility] Patient Information 2015 [place], [place]. This information is not intended to replace advice given to you by your health care provider. Make sure you discuss any  questions you have with your health care provider.

## 2013-08-17 NOTE — Progress Notes (Signed)
Subjective:    Patient ID: Christina Hayes, female    DOB: [birth year], 63 y.o.   MRN: [id number]  HPI46 y old female complains of pain in in Right Thumb over a month but increasing stiffness and pain for the past two days. Also has had new ankle edema for a few months, compression stockings have helped. Needs her simvastatin refilled. No hx for heart, liver, or renal disease but recently had bronchoscopic removal of a benign lung tumor.  No sob, cp, abdom pain, or bleeding.    Review of Systems     Objective:   Physical Exam  Vitals reviewed. Constitutional: She is oriented to person, place, and time. She appears well-developed and well-nourished. No distress.  HENT:  Head: Normocephalic.  Eyes: Conjunctivae and EOM are normal. Pupils are equal, round, and reactive to light.  Neck: Normal range of motion. No thyromegaly present.  Cardiovascular: Normal rate, regular rhythm, S1 normal, normal heart sounds and intact distal pulses.  Exam reveals no gallop.   No murmur heard. Pulmonary/Chest: Effort normal. She exhibits no tenderness.  Abdominal: Soft. Bowel sounds are normal. She exhibits no mass. There is no tenderness.  Musculoskeletal: She exhibits edema and tenderness.       Right hand: She exhibits tenderness and bony tenderness. She exhibits normal range of motion, normal two-point discrimination and normal capillary refill. Normal sensation noted. Normal strength noted. She exhibits no finger abduction, no thumb/finger opposition and no wrist extension trouble.       Hands: Neurological: She is alert and oriented to person, place, and time. She exhibits normal muscle tone. Coordination normal.  Skin: Skin is intact. No rash noted.  Psychiatric: She has a normal mood and affect.   Has 1-2+ ankle edema bilateral   [facility] Policy for Prescribing Controlled Substances (Revised 11/2011) 1. Prescriptions for controlled substances will be filled by ONE provider at [facility] with whom you  have established and developed a plan for your care, including follow-up. 2. You are encouraged to schedule an appointment with your prescriber at our appointment center for follow-up visits whenever possible. 3. If you request a prescription for the controlled substance while at [facility] for an acute problem (with someone other than your regular prescriber), you MAY be given a ONE-TIME prescription for a 30-day supply of the controlled substance, to allow time for you to return to see your regular prescriber for additional prescriptions.  [facility] reading (PRIMARY) by  Dr.Guest mild degenerative changes, possible osteopenia Results for orders placed in visit on 08/17/13  POCT CBC      Result Value Ref Range   WBC 6.7  4.6 - 10.2 K/uL   Lymph, poc 2.6  0.6 - 3.4   POC LYMPH PERCENT 38.8  10 - 50 %L   MID (cbc) 0.4  0 - 0.9   POC MID % 6.3  0 - 12 %M   POC Granulocyte 3.7  2 - 6.9   Granulocyte percent 54.9  37 - 80 %G   RBC 5.06  4.04 - 5.48 M/uL   Hemoglobin 13.7  12.2 - 16.2 g/dL   HCT, POC 42.4  37.7 - 47.9 %   MCV 83.7  80 - 97 fL   MCH, POC 27.0  27 - 31.2 pg   MCHC 32.3  31.8 - 35.4 g/dL   RDW, POC 14.7     Platelet Count, POC 405  142 - 424 K/uL   MPV 7.2  0 - 99.8 fL  POCT  UA - MICROSCOPIC ONLY      Result Value Ref Range   WBC, Ur, HPF, POC neg     RBC, urine, microscopic neg     Bacteria, U Microscopic neg     Mucus, UA neg     Epithelial cells, urine per micros 0-3     Crystals, Ur, HPF, POC neg     Casts, Ur, LPF, POC neg     Yeast, UA neg    POCT URINALYSIS DIPSTICK      Result Value Ref Range   Color, UA yellow     Clarity, UA clean     Glucose, UA neg     Bilirubin, UA neg     Ketones, UA neg     Spec Grav, UA 1.015     Blood, UA trace     pH, UA 7.0     Protein, UA neg     Urobilinogen, UA 0.2     Nitrite, UA neg     Leukocytes, UA Trace          Assessment & Plan:  Thumb pain/DJD Leg edema cause unclear--use compression and f/up with primary Refill  simvastatin

## 2013-08-18 ENCOUNTER — Encounter: Payer: Self-pay | Admitting: *Deleted

## 2013-08-18 LAB — LIPID PANEL
Cholesterol: 255 mg/dL — ABNORMAL HIGH (ref 0–200)
HDL: 78 mg/dL (ref >39)
LDL CALC: 147 mg/dL — AB (ref 0–99)
Total CHOL/HDL Ratio: 3.3 Ratio
Triglycerides: 152 mg/dL — ABNORMAL HIGH (ref <150)
VLDL: 30 mg/dL (ref 0–40)

## 2013-08-18 LAB — COMPREHENSIVE METABOLIC PANEL
ALBUMIN: 4.5 g/dL (ref 3.5–5.2)
ALT: 35 U/L (ref 0–35)
AST: 34 U/L (ref 0–37)
Alkaline Phosphatase: 121 U/L — ABNORMAL HIGH (ref 39–117)
BUN: 11 mg/dL (ref 6–23)
CALCIUM: 9.8 mg/dL (ref 8.4–10.5)
CO2: 27 meq/L (ref 19–32)
Chloride: 103 mEq/L (ref 96–112)
Creat: 0.84 mg/dL (ref 0.50–1.10)
Glucose, Bld: 101 mg/dL — ABNORMAL HIGH (ref 70–99)
POTASSIUM: 4.4 meq/L (ref 3.5–5.3)
Sodium: 138 mEq/L (ref 135–145)
Total Bilirubin: 0.6 mg/dL (ref 0.2–1.2)
Total Protein: 7.8 g/dL (ref 6.0–8.3)

## 2013-08-18 LAB — TSH: TSH: 3.067 u[IU]/mL (ref 0.350–4.500)

## 2013-10-15 ENCOUNTER — Other Ambulatory Visit: Payer: Self-pay | Admitting: *Deleted

## 2013-10-15 DIAGNOSIS — R222 Localized swelling, mass and lump, trunk: Secondary | ICD-10-CM

## 2013-10-16 ENCOUNTER — Encounter: Payer: Self-pay | Admitting: Thoracic Surgery (Cardiothoracic Vascular Surgery)

## 2013-11-10 ENCOUNTER — Encounter: Payer: Self-pay | Admitting: Thoracic Surgery (Cardiothoracic Vascular Surgery)

## 2013-11-10 ENCOUNTER — Ambulatory Visit
Admission: RE | Admit: 2013-11-10 | Discharge: 2013-11-10 | Disposition: A | Discharge status: Home / Self Care | Payer: 59 | Source: Ambulatory Visit | Attending: Thoracic Surgery (Cardiothoracic Vascular Surgery) | Admitting: Thoracic Surgery (Cardiothoracic Vascular Surgery)

## 2013-11-10 ENCOUNTER — Ambulatory Visit (INDEPENDENT_AMBULATORY_CARE_PROVIDER_SITE_OTHER): Payer: 59 | Admitting: Thoracic Surgery (Cardiothoracic Vascular Surgery)

## 2013-11-10 ENCOUNTER — Other Ambulatory Visit: Payer: Self-pay | Admitting: *Deleted

## 2013-11-10 VITALS — BP 135/80 | HR 100 | Ht 62.0 in | Wt 184.0 lb

## 2013-11-10 DIAGNOSIS — R222 Localized swelling, mass and lump, trunk: Secondary | ICD-10-CM

## 2013-11-10 DIAGNOSIS — R918 Other nonspecific abnormal finding of lung field: Secondary | ICD-10-CM

## 2013-11-10 NOTE — Progress Notes (Signed)
HPI:  Christina Hayes is a 63 year old woman who we did an endobronchial resection of a left mainstem carcinoid tumor back in April 2015.            She did well postoperatively and has continued to do well since the resection. Says she does have hot flashes sometimes at night but that has been going on for years. She has not had any wheezing or respiratory infections. Her weight is stable.  Past Medical History  Diagnosis Date  . HYPERLIPIDEMIA, MIXED 12/03/2006    PT STATES NOT TAKING MED AS PRESCRIBED  . VERTIGO, HX OF 12/03/2006  . MIGRAINES, HX OF 12/03/2006  . Allergic rhinitis, cause unspecified   . SUI (stress urinary incontinence, female)   . OSA on CPAP   . Head cold   . Cough   . Frequency of urination   . Urgency of urination   . Nocturia   . Acid reflux   . Complication of anesthesia SLOW TO WAKE  . PONV (postoperative nausea and vomiting)     in past  . Headache(784.0)     migraqine- uses essential oils  . Shortness of breath   . H/O hiatal hernia     small  . DJD (degenerative joint disease)     spine  . Allergy       Current Outpatient Prescriptions  Medication Sig Dispense Refill  . pantoprazole (PROTONIX) 40 MG tablet Take 1 tablet (40 mg total) by mouth daily. Take 30-60 min before first meal of the day  90 tablet  3  . simvastatin (ZOCOR) 40 MG tablet Take 1 tablet (40 mg total) by mouth daily.  90 tablet  1  . acetaminophen (TYLENOL) 500 MG tablet Take 1,000 mg by mouth every 6 (six) hours as needed.      Marland Kitchen albuterol (PROVENTIL HFA;VENTOLIN HFA) 108 (90 BASE) MCG/ACT inhaler Inhale 1-2 puffs into the lungs every 6 (six) hours as needed for wheezing or shortness of breath.      . chlorpheniramine (CHLOR-TRIMETON) 4 MG tablet Take 4 mg by mouth 2 (two) times daily as needed for allergies.      Marland Kitchen ibuprofen (ADVIL,MOTRIN) 100 MG chewable tablet Chew 400 mg by mouth every 8 (eight) hours as needed.       No current facility-administered medications for  this visit.    Physical Exam BP 135/80  Pulse 100  Ht 5\' 2"  (1.575 m)  Wt 184 lb (83.462 kg)  BMI 33.65 kg/m2  SpO58 57% 63 year old woman in no acute distress Alert x3 with no neurologic deficits Cardiac regular rate and rhythm normal S1 and S2 Lungs clear with equal breath sounds bilaterally  Diagnostic Tests: CT CHEST WITHOUT CONTRAST  TECHNIQUE:  Multidetector CT imaging of the chest was performed following the  standard protocol without IV contrast.  COMPARISON: PET-CT dated 04/09/2013. CT chest dated 02/16/2013.  FINDINGS:  Lungs are clear. Specifically, the prior right upper lobe  ground-glass opacity has resolved. No suspicious pulmonary nodules.  No pleural effusion or pneumothorax.  Visualized thyroid is unremarkable.  The heart is normal in size. No pericardial effusion. Coronary  atherosclerosis (series 3/ image 29).  No suspicious mediastinal or axillary lymphadenopathy.  Prior soft tissue lesion in the left mainstem bronchus is no longer  visualized, corresponding to known surgical resection.  Visualized upper abdomen is notable for layering gallbladder  sludge/small stones.  Mild degenerative changes of the mid thoracic spine.  IMPRESSION:  No evidence of acute cardiopulmonary disease.  Specifically, the prior right upper lobe ground-glass opacity has  resolved, possibly infectious/ inflammatory.  Prior soft tissue lesion in the left mainstem bronchus is no longer  visualized, corresponding to known surgical resection.  Electronically Signed  By: Julian Hy M.D.  On: 11/10/2013 12:45    Impression: 70 woman with a history of a left mainstem endobronchial carcinoid tumor status post endobronchial resection. She has no signs or symptoms of recurrence and her CT scan shows no evidence of recurrence as well. I think given the pristine appearance of her CT scan my recommendation will be that we repeat a CT scan in 6 months for one year followup and plan to  do bronchoscopy at that time.  On her last CT scan there was a groundglass opacity in the right upper lobe. That has resolved in the interim.   Plan:  Return in 6 months with CT of chest

## 2013-12-14 ENCOUNTER — Other Ambulatory Visit: Payer: Self-pay

## 2013-12-14 DIAGNOSIS — Z1231 Encounter for screening mammogram for malignant neoplasm of breast: Secondary | ICD-10-CM

## 2014-01-07 ENCOUNTER — Ambulatory Visit
Admission: RE | Admit: 2014-01-07 | Discharge: 2014-01-07 | Disposition: A | Discharge status: Home / Self Care | Payer: 59 | Source: Ambulatory Visit

## 2014-01-07 DIAGNOSIS — Z1231 Encounter for screening mammogram for malignant neoplasm of breast: Secondary | ICD-10-CM

## 2014-01-21 ENCOUNTER — Other Ambulatory Visit: Payer: Self-pay | Admitting: Internal Medicine

## 2014-01-26 ENCOUNTER — Encounter: Payer: Self-pay | Admitting: Family

## 2014-01-26 ENCOUNTER — Ambulatory Visit (INDEPENDENT_AMBULATORY_CARE_PROVIDER_SITE_OTHER)
Admission: RE | Admit: 2014-01-26 | Discharge: 2014-01-26 | Disposition: A | Discharge status: Home / Self Care | Payer: 59 | Source: Ambulatory Visit | Attending: Family | Admitting: Family

## 2014-01-26 ENCOUNTER — Ambulatory Visit (INDEPENDENT_AMBULATORY_CARE_PROVIDER_SITE_OTHER): Payer: 59 | Admitting: Family

## 2014-01-26 VITALS — BP 138/74 | HR 77 | Temp 97.8°F | Resp 18 | Ht 62.0 in | Wt 190.0 lb

## 2014-01-26 DIAGNOSIS — M545 Low back pain, unspecified: Secondary | ICD-10-CM

## 2014-01-26 DIAGNOSIS — R05 Cough: Secondary | ICD-10-CM

## 2014-01-26 DIAGNOSIS — G479 Sleep disorder, unspecified: Secondary | ICD-10-CM

## 2014-01-26 DIAGNOSIS — Z0000 Encounter for general adult medical examination without abnormal findings: Secondary | ICD-10-CM | POA: Insufficient documentation

## 2014-01-26 DIAGNOSIS — G4720 Circadian rhythm sleep disorder, unspecified type: Secondary | ICD-10-CM

## 2014-01-26 DIAGNOSIS — M549 Dorsalgia, unspecified: Secondary | ICD-10-CM | POA: Insufficient documentation

## 2014-01-26 DIAGNOSIS — R059 Cough, unspecified: Secondary | ICD-10-CM

## 2014-01-26 DIAGNOSIS — Z23 Encounter for immunization: Secondary | ICD-10-CM

## 2014-01-26 MED ORDER — SIMVASTATIN 40 MG PO TABS: 40.0000 mg | ORAL_TABLET | Freq: Every day | ORAL | Status: DC

## 2014-01-26 MED ORDER — TRAZODONE HCL 50 MG PO TABS: 25.0000 mg | ORAL_TABLET | Freq: Every evening | ORAL | Status: DC | PRN

## 2014-01-26 MED ORDER — PANTOPRAZOLE SODIUM 40 MG PO TBEC: 40.0000 mg | DELAYED_RELEASE_TABLET | Freq: Every day | ORAL | Status: DC

## 2014-01-26 NOTE — Progress Notes (Signed)
Subjective:    Patient ID: Christina Hayes, female    DOB: February 15, 1950, 64 y.o.   MRN: [id number]  Chief Complaint  Patient presents with  . Establish Care    check up, not fasting    HPI:  Christina Hayes is a 64 y.o. female who presents today for an annual wellness visit.   1) Health Maintenance - Rates overall health as good.   Diet - regular diet - describes as not good  Exercise - no regular exercise.    2) Preventative Exams / Immunizations:  Dental -- Up to date Vision -- Up to date  Health Maintenance  Topic Date Due  . COLONOSCOPY  02/27/1999  . TETANUS/TDAP  05/19/2013  . INFLUENZA VACCINE  02/06/2014 (Originally 08/22/2013)  . MAMMOGRAM  01/08/2016  . ZOSTAVAX  Completed  Will consider colonoscopy Tetanus shot; declines flu shot    Immunization History  Administered Date(s) Administered  . Pneumococcal Polysaccharide-23 03/25/2012  . Td 05/20/2003  . Zoster 04/25/2010   Allergies  Allergen Reactions  . Codeine Itching  . Tramadol     headache  . Vivactil [Protriptyline Hcl] Palpitations    Current Outpatient Prescriptions on File Prior to Visit  Medication Sig Dispense Refill  . chlorpheniramine (CHLOR-TRIMETON) 4 MG tablet Take 4 mg by mouth 2 (two) times daily as needed for allergies.    Marland Kitchen ibuprofen (ADVIL,MOTRIN) 100 MG chewable tablet Chew 400 mg by mouth every 8 (eight) hours as needed.     No current facility-administered medications on file prior to visit.    Past Medical History  Diagnosis Date  . HYPERLIPIDEMIA, MIXED 12/03/2006    PT STATES NOT TAKING MED AS PRESCRIBED  . VERTIGO, HX OF 12/03/2006  . MIGRAINES, HX OF 12/03/2006  . Allergic rhinitis, cause unspecified   . SUI (stress urinary incontinence, female)   . OSA on CPAP   . Head cold   . Cough   . Frequency of urination   . Urgency of urination   . Nocturia   . Acid reflux   . Complication of anesthesia SLOW TO WAKE  . PONV (postoperative nausea and vomiting)     in  past  . Headache(784.0)     migraqine- uses essential oils  . Shortness of breath   . H/O hiatal hernia     small  . DJD (degenerative joint disease)     spine  . Allergy     Past Surgical History  Procedure Laterality Date  . Salpingoophorectomy  1995    BILATERAL  . Vaginal hysterectomy  1988  . Pubovaginal sling  04/30/2011    Procedure: Gaynelle Arabian;  Surgeon: Bernestine Amass, MD;  Location: [facility];  Service: Urology;  Laterality: N/A;  sub urethral sling    possible ower  . Video bronchoscopy Bilateral 03/26/2013    Procedure: VIDEO BRONCHOSCOPY WITHOUT FLUORO;  Surgeon: Tanda Rockers, MD;  Location: [facility];  Service: Cardiopulmonary;  Laterality: Bilateral;  . Video bronchoscopy N/A 04/23/2013    Procedure: VIDEO BRONCHOSCOPY;  Surgeon: Melrose Nakayama, MD;  Location: [facility];  Service: Thoracic;  Laterality: N/A;  . Rigid bronchoscopy N/A 04/23/2013    Procedure: LASER BRONCHOSCOPY;  Surgeon: Melrose Nakayama, MD;  Location: [facility];  Service: Thoracic;  Laterality: N/A;    Family History  Problem Relation Age of Onset  . Cancer Mother     Colon Cancer survivor  . Diabetes Mother   . Diabetes Father   .  Hyperlipidemia Father   . Alzheimer's disease Father   . Cancer Maternal Grandmother     Breast Cancer  . Cancer Daughter 46    stage III-B breast CA  . Allergies Daughter     History   Social History  . Marital Status: Married    Spouse Name: N/A    Number of Children: 1  . Years of Education: 12   Occupational History  . Retired    Social History Main Topics  . Smoking status: Never Smoker   . Smokeless tobacco: Never Used  . Alcohol Use: No  . Drug Use: No  . Sexual Activity:    Partners: Male    Birth Control/ Protection: Surgical   Other Topics Concern  . 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 HENT: Head: Denies headache or neck pain Ears: Denies changes in hearing, ringing in ears, earache, drainage Nose: Denies discharge, stuffiness, itching, nosebleed, sinus pain Throat: Denies sore throat, hoarseness, dry mouth, sores, thrush Eyes: Denies loss/changes in vision, pain, redness, blurry/double vision, flashing lights Cardiovascular: Denies chest pain/discomfort, tightness, palpitations, shortness of breath with activity, difficulty lying down, swelling, sudden awakening with shortness of breath Respiratory: Denies shortness of breath, cough, sputum production, wheezing Has sleep apnea and uses a CPAP machine and is currently experiencing issues with falling asleep at night which has been going on for a "long time now." Has tried melatonin and Ambien.  Gastrointestinal: Denies dysphasia, heartburn, change in appetite, nausea, change in bowel habits, rectal bleeding, constipation, diarrhea, yellow skin or eyes Genitourinary: Denies frequency, urgency, burning/pain, blood in urine, incontinence, change in urinary strength. Musculoskeletal: Denies muscle/joint pain, stiffness, back pain, redness or swelling of joints, trauma Low back pain that move towards her left hip which has been going on and off for about a year and has lately gotten worse. Describes the pain as sharp pains. Has tried heating pads which have provided some relief.  Skin: Denies rashes, lumps, itching, dryness, color changes, or hair/nail changes Neurological: Denies dizziness, fainting, seizures, weakness, numbness, tingling, tremor Psychiatric - Denies nervousness, stress, depression or memory loss Endocrine: Denies heat or cold intolerance, sweating, frequent urination, excessive thirst, changes in appetite Hematologic: Denies ease of bruising or bleeding     Objective:     BP  138/74 mmHg  Pulse 77  Temp(Src) 97.8 F (36.6 C) (Oral)  Resp 18  Ht 5\' 2"  (1.575 m)  Wt 190 lb (86.183 kg)  BMI 34.74 kg/m2  SpO2 95% Nursing note and vital signs reviewed.  Physical Exam  Constitutional: She is oriented to person, place, and time. She appears well-developed and well-nourished.  HENT:  Head: Normocephalic.  Right Ear: Hearing, tympanic membrane, external ear and ear canal normal.  Left Ear: Hearing, tympanic membrane, external ear and ear canal normal.  Nose: Nose normal.  Mouth/Throat: Uvula is midline, oropharynx is clear and moist and mucous membranes are normal.  Eyes: Conjunctivae and EOM are normal. Pupils are equal, round, and reactive to light.  Neck: Neck supple. No JVD present. No tracheal deviation present. No thyromegaly present.  Cardiovascular: Normal rate, regular rhythm, normal heart sounds and intact distal pulses.   Pulmonary/Chest: Effort normal and breath sounds normal.  Abdominal: Soft. Bowel sounds are normal. She exhibits no distension and no mass. There is no tenderness. There is no rebound and no guarding.  Musculoskeletal: Normal range of motion. She exhibits no edema or tenderness.  No obvious deformity, discoloration or edema of low back noted. Tenderness elicited over left lateral paraspinal musculature at the lumbar level. Reflexes, sensation and pulses are intact and appropriate. ROM is intact and appropriate.  Negative straight leg raise.   Lymphadenopathy:    She has no cervical adenopathy.  Neurological: She is alert and oriented to person, place, and time. She has normal reflexes. No cranial nerve deficit. She exhibits normal muscle tone. Coordination normal.  Skin: Skin is warm and dry.  Psychiatric: She has a normal mood and affect. Her behavior is normal. Judgment and thought content normal.       Assessment & Plan:

## 2014-01-26 NOTE — Patient Instructions (Addendum)
Thank you for choosing [facility].  Summary/Instructions:  Your prescription(s) have been submitted to your pharmacy or been printed and provided for you. Please take as directed and contact our office if you believe you are having problem(s) with the medication(s) or have any questions.  Please stop by the lab on the basement level of the building for your blood work. Your results will be released to [place] (or called to you) after review, usually within 72 hours after test completion. If any changes need to be made, you will be notified at that same time.  Please stop by radiology on the basement level of the building for your x-rays. Your results will be released to [place] (or called to you) after review, usually within 72 hours after test completion. If any treatments or changes are necessary, you will be notified at that same time.  If your symptoms worsen or fail to improve, please contact our office for further instruction, or in case of emergency go directly to the emergency room at the closest medical facility.   Health Maintenance Adopting a healthy lifestyle and getting preventive care can go a long way to promote health and wellness. Talk with your health care provider about what schedule of regular examinations is right for you. This is a good chance for you to check in with your provider about disease prevention and staying healthy. In between checkups, there are plenty of things you can do on your own. Experts have done a lot of research about which lifestyle changes and preventive measures are most likely to keep you healthy. Ask your health care provider for more information. WEIGHT AND DIET  Eat a healthy diet  Be sure to include plenty of vegetables, fruits, low-fat dairy products, and lean protein.  Do not eat a lot of foods high in solid fats, added sugars, or salt.  Get regular exercise. This is one of the most important things you can do for your health.  Most  adults should exercise for at least 150 minutes each week. The exercise should increase your heart rate and make you sweat (moderate-intensity exercise).  Most adults should also do strengthening exercises at least twice a week. This is in addition to the moderate-intensity exercise.  Maintain a healthy weight  Body mass index (BMI) is a measurement that can be used to identify possible weight problems. It estimates body fat based on height and weight. Your health care provider can help determine your BMI and help you achieve or maintain a healthy weight.  For females 26 years of age and older:   A BMI below 18.5 is considered underweight.  A BMI of 18.5 to 24.9 is normal.  A BMI of 25 to 29.9 is considered overweight.  A BMI of 30 and above is considered obese.  Watch levels of cholesterol and blood lipids  You should start having your blood tested for lipids and cholesterol at 65 years of age, then have this test every 5 years.  You may need to have your cholesterol levels checked more often if:  Your lipid or cholesterol levels are high.  You are older than 65 years of age.  You are at high risk for heart disease.  CANCER SCREENING   Lung Cancer  Lung cancer screening is recommended for adults 55-56 years old who are at high risk for lung cancer because of a history of smoking.  A yearly low-dose CT scan of the lungs is recommended for people who:  Currently smoke.  Have quit within the past 15 years.  Have at least a 30-pack-year history of smoking. A pack year is smoking an average of one pack of cigarettes a day for 1 year.  Yearly screening should continue until it has been 15 years since you quit.  Yearly screening should stop if you develop a health problem that would prevent you from having lung cancer treatment.  Breast Cancer  Practice breast self-awareness. This means understanding how your breasts normally appear and feel.  It also means doing  regular breast self-exams. Let your health care provider know about any changes, no matter how small.  If you are in your 20s or 30s, you should have a clinical breast exam (CBE) by a health care provider every 1-3 years as part of a regular health exam.  If you are 29 or older, have a CBE every year. Also consider having a breast X-ray (mammogram) every year.  If you have a family history of breast cancer, talk to your health care provider about genetic screening.  If you are at high risk for breast cancer, talk to your health care provider about having an MRI and a mammogram every year.  Breast cancer gene (BRCA) assessment is recommended for women who have family members with BRCA-related cancers. BRCA-related cancers include:  Breast.  Ovarian.  Tubal.  Peritoneal cancers.  Results of the assessment will determine the need for genetic counseling and BRCA1 and BRCA2 testing. Cervical Cancer Routine pelvic examinations to screen for cervical cancer are no longer recommended for nonpregnant women who are considered low risk for cancer of the pelvic organs (ovaries, uterus, and vagina) and who do not have symptoms. A pelvic examination may be necessary if you have symptoms including those associated with pelvic infections. Ask your health care provider if a screening pelvic exam is right for you.   The Pap test is the screening test for cervical cancer for women who are considered at risk.  If you had a hysterectomy for a problem that was not cancer or a condition that could lead to cancer, then you no longer need Pap tests.  If you are older than 65 years, and you have had normal Pap tests for the past 10 years, you no longer need to have Pap tests.  If you have had past treatment for cervical cancer or a condition that could lead to cancer, you need Pap tests and screening for cancer for at least 20 years after your treatment.  If you no longer get a Pap test, assess your risk  factors if they change (such as having a new sexual partner). This can affect whether you should start being screened again.  Some women have medical problems that increase their chance of getting cervical cancer. If this is the case for you, your health care provider may recommend more frequent screening and Pap tests.  The human papillomavirus (HPV) test is another test that may be used for cervical cancer screening. The HPV test looks for the virus that can cause cell changes in the cervix. The cells collected during the Pap test can be tested for HPV.  The HPV test can be used to screen women 4 years of age and older. Getting tested for HPV can extend the interval between normal Pap tests from three to five years.  An HPV test also should be used to screen women of any age who have unclear Pap test results.  After 65 years of age, women should have HPV  testing as often as Pap tests.  Colorectal Cancer  This type of cancer can be detected and often prevented.  Routine colorectal cancer screening usually begins at 65 years of age and continues through 65 years of age.  Your health care provider may recommend screening at an earlier age if you have risk factors for colon cancer.  Your health care provider may also recommend using home test kits to check for hidden blood in the stool.  A small camera at the end of a tube can be used to examine your colon directly (sigmoidoscopy or colonoscopy). This is done to check for the earliest forms of colorectal cancer.  Routine screening usually begins at age 58.  Direct examination of the colon should be repeated every 5-10 years through 65 years of age. However, you may need to be screened more often if early forms of precancerous polyps or small growths are found. Skin Cancer  Check your skin from head to toe regularly.  Tell your health care provider about any new moles or changes in moles, especially if there is a change in a mole's shape  or color.  Also tell your health care provider if you have a mole that is larger than the size of a pencil eraser.  Always use sunscreen. Apply sunscreen liberally and repeatedly throughout the day.  Protect yourself by wearing long sleeves, pants, a wide-brimmed hat, and sunglasses whenever you are outside. HEART DISEASE, DIABETES, AND HIGH BLOOD PRESSURE   Have your blood pressure checked at least every 1-2 years. High blood pressure causes heart disease and increases the risk of stroke.  If you are between 41 years and 72 years old, ask your health care provider if you should take aspirin to prevent strokes.  Have regular diabetes screenings. This involves taking a blood sample to check your fasting blood sugar level.  If you are at a normal weight and have a low risk for diabetes, have this test once every three years after 65 years of age.  If you are overweight and have a high risk for diabetes, consider being tested at a younger age or more often. PREVENTING INFECTION  Hepatitis B  If you have a higher risk for hepatitis B, you should be screened for this virus. You are considered at high risk for hepatitis B if:  You were born in a country where hepatitis B is common. Ask your health care provider which countries are considered high risk.  Your parents were born in a high-risk country, and you have not been immunized against hepatitis B (hepatitis B vaccine).  You have HIV or AIDS.  You use needles to inject street drugs.  You live with someone who has hepatitis B.  You have had sex with someone who has hepatitis B.  You get hemodialysis treatment.  You take certain medicines for conditions, including cancer, organ transplantation, and autoimmune conditions. Hepatitis C  Blood testing is recommended for:  Everyone born from [birth year] through 1966.  Anyone with known risk factors for hepatitis C. Sexually transmitted infections (STIs)  You should be screened for  sexually transmitted infections (STIs) including gonorrhea and chlamydia if:  You are sexually active and are younger than 65 years of age.  You are older than 65 years of age and your health care provider tells you that you are at risk for this type of infection.  Your sexual activity has changed since you were last screened and you are at an increased risk for chlamydia  or gonorrhea. Ask your health care provider if you are at risk.  If you do not have HIV, but are at risk, it may be recommended that you take a prescription medicine daily to prevent HIV infection. This is called pre-exposure prophylaxis (PrEP). You are considered at risk if:  You are sexually active and do not regularly use condoms or know the HIV status of your partner(s).  You take drugs by injection.  You are sexually active with a partner who has HIV. Talk with your health care provider about whether you are at high risk of being infected with HIV. If you choose to begin PrEP, you should first be tested for HIV. You should then be tested every 3 months for as long as you are taking PrEP.  PREGNANCY   If you are premenopausal and you may become pregnant, ask your health care provider about preconception counseling.  If you may become pregnant, take 400 to 800 micrograms (mcg) of folic acid every day.  If you want to prevent pregnancy, talk to your health care provider about birth control (contraception). OSTEOPOROSIS AND MENOPAUSE   Osteoporosis is a disease in which the bones lose minerals and strength with aging. This can result in serious bone fractures. Your risk for osteoporosis can be identified using a bone density scan.  If you are 61 years of age or older, or if you are at risk for osteoporosis and fractures, ask your health care provider if you should be screened.  Ask your health care provider whether you should take a calcium or vitamin D supplement to lower your risk for osteoporosis.  Menopause may  have certain physical symptoms and risks.  Hormone replacement therapy may reduce some of these symptoms and risks. Talk to your health care provider about whether hormone replacement therapy is right for you.  HOME CARE INSTRUCTIONS   Schedule regular health, dental, and eye exams.  Stay current with your immunizations.   Do not use any tobacco products including cigarettes, chewing tobacco, or electronic cigarettes.  If you are pregnant, do not drink alcohol.  If you are breastfeeding, limit how much and how often you drink alcohol.  Limit alcohol intake to no more than 1 drink per day for nonpregnant women. One drink equals 12 ounces of beer, 5 ounces of wine, or 1 ounces of hard liquor.  Do not use street drugs.  Do not share needles.  Ask your health care provider for help if you need support or information about quitting drugs.  Tell your health care provider if you often feel depressed.  Tell your health care provider if you have ever been abused or do not feel safe at home. Document Released: 07/24/2010 Document Revised: 05/25/2013 Document Reviewed: 12/10/2012 [facility] Patient Information 2015 [place], [place]. This information is not intended to replace advice given to you by your health care provider. Make sure you discuss any questions you have with your health care provider.

## 2014-01-26 NOTE — Assessment & Plan Note (Signed)
1) Anticipatory Guidance: Discussed importance of wearing a seatbelt while driving and not texting while driving; changing batteries in smoke detector at least once annually; wearing suntan lotion when outside; eating a balanced and moderate diet; getting physical activity at least 30 minutes per day.  2) Immunizations / Screenings / Labs:  Patient received tetanus shot today and declined flu shot. All other recommended immunizations are up to date. Patient is due for colonoscopy. Discussed risks and benefits of colonoscopy and patient elects to hold at this time. All other screenings are up to date per recommendations. Obtain CBC, BMET, Lipid profile and TSH.   Overall well exam. Discussed risk factors of obesity and hyperlipidemia and ways to improve health through increasing nutrient density and increasing physical activity. Recommend colonoscopy - patient will consider. Continue other healthy lifestyle choices. Follow up prevention exam in 1 year or sooner pending lab work.

## 2014-01-26 NOTE — Progress Notes (Signed)
Pre visit review using our clinic review tool, if applicable. No additional management support is needed unless otherwise documented below in the visit note. 

## 2014-01-26 NOTE — Assessment & Plan Note (Signed)
Patient describes sleep disturbance in trying to fall asleep. Start trazadone as needed. Follow up in 1 month or sooner if needed.

## 2014-01-26 NOTE — Assessment & Plan Note (Signed)
Symptoms and exam consistent with repetitive back stress resulting in soreness. Continue supportive care with heat therapy and NSAIDs as needed for symptom relief. Obtain x-ray per patient request.

## 2014-01-29 ENCOUNTER — Other Ambulatory Visit (INDEPENDENT_AMBULATORY_CARE_PROVIDER_SITE_OTHER): Payer: 59

## 2014-01-29 DIAGNOSIS — Z0000 Encounter for general adult medical examination without abnormal findings: Secondary | ICD-10-CM

## 2014-01-29 LAB — CBC
HCT: 38.1 % (ref 36.0–46.0)
HEMOGLOBIN: 12.4 g/dL (ref 12.0–15.0)
MCHC: 32.6 g/dL (ref 30.0–36.0)
MCV: 82.3 fl (ref 78.0–100.0)
PLATELETS: 390 10*3/uL (ref 150.0–400.0)
RBC: 4.63 Mil/uL (ref 3.87–5.11)
RDW: 14.7 % (ref 11.5–15.5)
WBC: 6.3 10*3/uL (ref 4.0–10.5)

## 2014-01-29 LAB — TSH: TSH: 4.54 u[IU]/mL — AB (ref 0.35–4.50)

## 2014-01-29 LAB — LIPID PANEL
CHOL/HDL RATIO: 3
Cholesterol: 214 mg/dL — ABNORMAL HIGH (ref 0–200)
HDL: 69.5 mg/dL (ref >39.00)
LDL CALC: 121 mg/dL — AB (ref 0–99)
NONHDL: 144.5
Triglycerides: 119 mg/dL (ref 0.0–149.0)
VLDL: 23.8 mg/dL (ref 0.0–40.0)

## 2014-01-29 LAB — BASIC METABOLIC PANEL
BUN: 13 mg/dL (ref 6–23)
CHLORIDE: 106 meq/L (ref 96–112)
CO2: 26 meq/L (ref 19–32)
Calcium: 9.5 mg/dL (ref 8.4–10.5)
Creatinine, Ser: 0.9 mg/dL (ref 0.4–1.2)
GFR: 70.4 mL/min (ref >60.00)
Glucose, Bld: 121 mg/dL — ABNORMAL HIGH (ref 70–99)
Potassium: 4 mEq/L (ref 3.5–5.1)
Sodium: 142 mEq/L (ref 135–145)

## 2014-01-30 ENCOUNTER — Encounter: Payer: Self-pay | Admitting: Family

## 2014-02-17 ENCOUNTER — Encounter: Payer: Self-pay | Admitting: Family

## 2014-02-17 ENCOUNTER — Ambulatory Visit (INDEPENDENT_AMBULATORY_CARE_PROVIDER_SITE_OTHER): Payer: 59 | Admitting: Family

## 2014-02-17 VITALS — BP 122/62 | HR 61 | Temp 97.4°F | Resp 18 | Ht 62.0 in | Wt 190.4 lb

## 2014-02-17 DIAGNOSIS — G479 Sleep disorder, unspecified: Secondary | ICD-10-CM

## 2014-02-17 DIAGNOSIS — G4720 Circadian rhythm sleep disorder, unspecified type: Secondary | ICD-10-CM

## 2014-02-17 MED ORDER — TRAZODONE HCL 100 MG PO TABS: 100.0000 mg | ORAL_TABLET | Freq: Every day | ORAL | Status: DC

## 2014-02-17 NOTE — Progress Notes (Signed)
Pre visit review using our clinic review tool, if applicable. No additional management support is needed unless otherwise documented below in the visit note. 

## 2014-02-17 NOTE — Assessment & Plan Note (Signed)
Continues to experience difficulty falling asleep. Patient indicates she is sleeping approximately 7 hours per night. The problem remains her inability to fall asleep. Patient indicates that she does not really have a sleep schedule. Discussed importance of sleep hygiene. His trazodone to 100 mg at bedtime. Follow-up to determine effectiveness.

## 2014-02-17 NOTE — Patient Instructions (Addendum)
Thank you for choosing [facility].  Summary/Instructions:  We have increased your trazadone to 100 mg nightly. Please let us know if a couple of weeks if you have had any improvements.   Your prescription(s) have been submitted to your pharmacy or been printed and provided for you. Please take as directed and contact our office if you believe you are having problem(s) with the medication(s) or have any questions.   Insomnia Insomnia is frequent trouble falling and/or staying asleep. Insomnia can be a long term problem or a short term problem. Both are common. Insomnia can be a short term problem when the wakefulness is related to a certain stress or worry. Long term insomnia is often related to ongoing stress during waking hours and/or poor sleeping habits. Overtime, sleep deprivation itself can make the problem worse. Every little thing feels more severe because you are overtired and your ability to cope is decreased. CAUSES   Stress, anxiety, and depression.  Poor sleeping habits.  Distractions such as TV in the bedroom.  Naps close to bedtime.  Engaging in emotionally charged conversations before bed.  Technical reading before sleep.  Alcohol and other sedatives. They may make the problem worse. They can hurt normal sleep patterns and normal dream activity.  Stimulants such as caffeine for several hours prior to bedtime.  Pain syndromes and shortness of breath can cause insomnia.  Exercise late at night.  Changing time zones may cause sleeping problems (jet lag). It is sometimes helpful to have someone observe your sleeping patterns. They should look for periods of not breathing during the night (sleep apnea). They should also look to see how long those periods last. If you live alone or observers are uncertain, you can also be observed at a sleep clinic where your sleep patterns will be professionally monitored. Sleep apnea requires a checkup and treatment. Give your  caregivers your medical history. Give your caregivers observations your family has made about your sleep.  SYMPTOMS   Not feeling rested in the morning.  Anxiety and restlessness at bedtime.  Difficulty falling and staying asleep. TREATMENT   Your caregiver may prescribe treatment for an underlying medical disorders. Your caregiver can give advice or help if you are using alcohol or other drugs for self-medication. Treatment of underlying problems will usually eliminate insomnia problems.  Medications can be prescribed for short time use. They are generally not recommended for lengthy use.  Over-the-counter sleep medicines are not recommended for lengthy use. They can be habit forming.  You can promote easier sleeping by making lifestyle changes such as:  Using relaxation techniques that help with breathing and reduce muscle tension.  Exercising earlier in the day.  Changing your diet and the time of your last meal. No night time snacks.  Establish a regular time to go to bed.  Counseling can help with stressful problems and worry.  Soothing music and white noise may be helpful if there are background noises you cannot remove.  Stop tedious detailed work at least one hour before bedtime. HOME CARE INSTRUCTIONS   Keep a diary. Inform your caregiver about your progress. This includes any medication side effects. See your caregiver regularly. Take note of:  Times when you are asleep.  Times when you are awake during the night.  The quality of your sleep.  How you feel the next day. This information will help your caregiver care for you.  Get out of bed if you are still awake after 15 minutes. Read or  do some quiet activity. Keep the lights down. Wait until you feel sleepy and go back to bed.  Keep regular sleeping and waking hours. Avoid naps.  Exercise regularly.  Avoid distractions at bedtime. Distractions include watching television or engaging in any intense or  detailed activity like attempting to balance the household checkbook.  Develop a bedtime ritual. Keep a familiar routine of bathing, brushing your teeth, climbing into bed at the same time each night, listening to soothing music. Routines increase the success of falling to sleep faster.  Use relaxation techniques. This can be using breathing and muscle tension release routines. It can also include visualizing peaceful scenes. You can also help control troubling or intruding thoughts by keeping your mind occupied with boring or repetitive thoughts like the old concept of counting sheep. You can make it more creative like imagining planting one beautiful flower after another in your backyard garden.  During your day, work to eliminate stress. When this is not possible use some of the previous suggestions to help reduce the anxiety that accompanies stressful situations. MAKE SURE YOU:   Understand these instructions.  Will watch your condition.  Will get help right away if you are not doing well or get worse. Document Released: 01/06/2000 Document Revised: 04/02/2011 Document Reviewed: 02/05/2007 [facility] Patient Information 2015 [place], [place]. This information is not intended to replace advice given to you by your health care provider. Make sure you discuss any questions you have with your health care provider.

## 2014-02-17 NOTE — Progress Notes (Signed)
   Subjective:    Patient ID: Christina Hayes, female    DOB: 10-29-1950, 64 y.o.   MRN: [id number]  Chief Complaint  Patient presents with  . Follow-up    was put on a trazodone for sleep last visit, says its not helping much shes still up quite a bit after taking the medication    HPI:  Christina Hayes is a 64 y.o. female who presents today to follow up on her sleep disturbance.  Previously started Trazadone for sleep and indicates that she has slept on average around 7 hours, however continues to experience the associated symptom of difficulty falling asleep. Indicates that she does not have a set sleep time but when she attempts to go to be then she is just laying laying there for up to several hours on occasion. Has previously tried Ambien which worked okay.    Allergies  Allergen Reactions  . Codeine Itching  . Tramadol     headache  . Vivactil [Protriptyline Hcl] Palpitations    Current Outpatient Prescriptions on File Prior to Visit  Medication Sig Dispense Refill  . chlorpheniramine (CHLOR-TRIMETON) 4 MG tablet Take 4 mg by mouth 2 (two) times daily as needed for allergies.    . diphenhydrAMINE (BENADRYL) 25 MG tablet Take 25 mg by mouth as needed.    Marland Kitchen ibuprofen (ADVIL,MOTRIN) 100 MG chewable tablet Chew 400 mg by mouth every 8 (eight) hours as needed.    . pantoprazole (PROTONIX) 40 MG tablet Take 1 tablet (40 mg total) by mouth daily. Take 30-60 min before first meal of the day 90 tablet 3  . simvastatin (ZOCOR) 40 MG tablet Take 1 tablet (40 mg total) by mouth daily. 90 tablet 3   No current facility-administered medications on file prior to visit.    Review of Systems  Psychiatric/Behavioral: Positive for sleep disturbance.      Objective:    BP 122/62 mmHg  Pulse 61  Temp(Src) 97.4 F (36.3 C) (Oral)  Resp 18  Ht 5\' 2"  (1.575 m)  Wt 190 lb 6.4 oz (86.365 kg)  BMI 34.82 kg/m2  SpO2 96% Nursing note and vital signs reviewed.  Physical Exam    Constitutional: She is oriented to person, place, and time. She appears well-developed and well-nourished. No distress.  Cardiovascular: Normal rate, regular rhythm, normal heart sounds and intact distal pulses.   Pulmonary/Chest: Effort normal and breath sounds normal.  Neurological: She is alert and oriented to person, place, and time.  Skin: Skin is warm and dry.  Psychiatric: She has a normal mood and affect. Her behavior is normal. Judgment and thought content normal.       Assessment & Plan:

## 2014-02-25 ENCOUNTER — Ambulatory Visit (INDEPENDENT_AMBULATORY_CARE_PROVIDER_SITE_OTHER): Payer: Medicare Other | Admitting: Family

## 2014-02-25 ENCOUNTER — Encounter: Payer: Self-pay | Admitting: Family

## 2014-02-25 VITALS — BP 132/74 | HR 78 | Temp 98.2°F | Resp 18 | Wt 189.8 lb

## 2014-02-25 DIAGNOSIS — J029 Acute pharyngitis, unspecified: Secondary | ICD-10-CM | POA: Diagnosis not present

## 2014-02-25 MED ORDER — CEFUROXIME AXETIL 250 MG PO TABS
250.0000 mg | ORAL_TABLET | Freq: Two times a day (BID) | ORAL | Status: DC
Start: 2014-02-25 — End: 2014-05-18

## 2014-02-25 NOTE — Progress Notes (Signed)
Pre visit review using our clinic review tool, if applicable. No additional management support is needed unless otherwise documented below in the visit note. 

## 2014-02-25 NOTE — Patient Instructions (Signed)
Thank you for choosing [facility].  Summary/Instructions:  Your prescription(s) have been submitted to your pharmacy or been printed and provided for you. Please take as directed and contact our office if you believe you are having problem(s) with the medication(s) or have any questions.  If your symptoms worsen or fail to improve, please contact our office for further instruction, or in case of emergency go directly to the emergency room at the closest medical facility.    Headaches / General Aches   Tylenol (generic acetaminophen) - DO NOT EXCEED 3 grams (3,000 mg) in a 24 hour time period  Advil/Motrin (generic ibuprofen)   Sore Throat -   Salt water gargle   Chloraseptic (generic benzocaine) spray or lozenges / Sucrets (generic dyclonine)

## 2014-02-25 NOTE — Progress Notes (Signed)
   Subjective:    Patient ID: Christina Hayes, female    DOB: 1950/03/14, 64 y.o.   MRN: [id number]  Chief Complaint  Patient presents with  . Sore Throat    Sore throat mainly on right side, thinks she might have swollen glands, x4 days, right ear started bothering her this morning    HPI:  Christina Hayes is a 64 y.o. female who presents today for an acute visit.   This is a new problem. Associated symptoms of right sided sore throat and swollen glands has been going on for about 4 days. Also notes her ears started bothering her this morning. Has tried chloroseptic which helps temporarily. No recent antibiotic use.    Allergies  Allergen Reactions  . Codeine Itching  . Tramadol     headache  . Vivactil [Protriptyline Hcl] Palpitations    Current Outpatient Prescriptions on File Prior to Visit  Medication Sig Dispense Refill  . chlorpheniramine (CHLOR-TRIMETON) 4 MG tablet Take 4 mg by mouth 2 (two) times daily as needed for allergies.    . diphenhydrAMINE (BENADRYL) 25 MG tablet Take 25 mg by mouth as needed.    Marland Kitchen ibuprofen (ADVIL,MOTRIN) 100 MG chewable tablet Chew 400 mg by mouth every 8 (eight) hours as needed.    . pantoprazole (PROTONIX) 40 MG tablet Take 1 tablet (40 mg total) by mouth daily. Take 30-60 min before first meal of the day 90 tablet 3  . simvastatin (ZOCOR) 40 MG tablet Take 1 tablet (40 mg total) by mouth daily. 90 tablet 3  . traZODone (DESYREL) 100 MG tablet Take 1 tablet (100 mg total) by mouth at bedtime. 30 tablet 0   No current facility-administered medications on file prior to visit.    Review of Systems  Constitutional: Negative for fever and chills.  HENT: Positive for sore throat. Negative for congestion and sinus pressure.   Respiratory: Negative for cough, chest tightness and shortness of breath.   Neurological: Negative for headaches.      Objective:    BP 132/74 mmHg  Pulse 78  Temp(Src) 98.2 F (36.8 C) (Oral)  Resp 18  Wt 189 lb  12.8 oz (86.093 kg)  SpO2 96% Nursing note and vital signs reviewed.  Physical Exam  Constitutional: She is oriented to person, place, and time. She appears well-developed and well-nourished. No distress.  HENT:  Right Ear: Hearing, tympanic membrane, external ear and ear canal normal.  Left Ear: Hearing, tympanic membrane, external ear and ear canal normal.  Nose: Nose normal. Right sinus exhibits no maxillary sinus tenderness and no frontal sinus tenderness. Left sinus exhibits no maxillary sinus tenderness and no frontal sinus tenderness.  Mouth/Throat: Uvula is midline. Posterior oropharyngeal erythema present. No oropharyngeal exudate or posterior oropharyngeal edema.  Neck: Neck supple.  Cardiovascular: Normal rate, regular rhythm, normal heart sounds and intact distal pulses.   Pulmonary/Chest: Effort normal and breath sounds normal.  Lymphadenopathy:    She has no cervical adenopathy.  Neurological: She is alert and oriented to person, place, and time.  Skin: Skin is warm and dry.  Psychiatric: She has a normal mood and affect. Her behavior is normal. Judgment and thought content normal.       Assessment & Plan:

## 2014-02-25 NOTE — Assessment & Plan Note (Signed)
Cannot rule out viral pharyngitis. Discussed with patient appropriate antibiotic use. Patient wishes to have antibiotic printed and will wait 1-2 days to see if improvement is noted. If symptoms worsen she'll start antibiotic. Start Ceftin if needed. Discussed over-the-counter medications as needed for symptom relief and supportive care. Follow up if symptoms worsen or fail to improve.

## 2014-02-26 ENCOUNTER — Other Ambulatory Visit: Payer: Self-pay

## 2014-02-26 DIAGNOSIS — R05 Cough: Secondary | ICD-10-CM

## 2014-02-26 DIAGNOSIS — R059 Cough, unspecified: Secondary | ICD-10-CM

## 2014-02-26 MED ORDER — PANTOPRAZOLE SODIUM 40 MG PO TBEC: 40.0000 mg | DELAYED_RELEASE_TABLET | Freq: Every day | ORAL | Status: DC

## 2014-02-26 MED ORDER — SIMVASTATIN 40 MG PO TABS: 40.0000 mg | ORAL_TABLET | Freq: Every day | ORAL | Status: DC

## 2014-03-26 ENCOUNTER — Encounter: Payer: Self-pay | Admitting: Gastroenterology

## 2014-04-06 ENCOUNTER — Telehealth: Payer: Self-pay

## 2014-04-06 NOTE — Telephone Encounter (Signed)
Per the spouse, the patient did not have her flu shot this year and does not think she will have one at this time. Informed spouse that patient could get flu shot at the office until 04/22/2014.

## 2014-04-07 ENCOUNTER — Encounter: Payer: Self-pay | Admitting: Family

## 2014-04-07 ENCOUNTER — Other Ambulatory Visit: Payer: Self-pay | Admitting: *Deleted

## 2014-04-07 DIAGNOSIS — G4700 Insomnia, unspecified: Secondary | ICD-10-CM

## 2014-04-07 DIAGNOSIS — R918 Other nonspecific abnormal finding of lung field: Secondary | ICD-10-CM

## 2014-04-08 MED ORDER — ALPRAZOLAM 0.5 MG PO TABS: 0.5000 mg | ORAL_TABLET | Freq: Every evening | ORAL | Status: DC | PRN

## 2014-04-23 ENCOUNTER — Encounter: Payer: Self-pay | Admitting: Family

## 2014-04-23 DIAGNOSIS — G4700 Insomnia, unspecified: Secondary | ICD-10-CM

## 2014-05-12 ENCOUNTER — Other Ambulatory Visit: Payer: Self-pay | Admitting: Family

## 2014-05-12 ENCOUNTER — Telehealth: Payer: Self-pay

## 2014-05-12 DIAGNOSIS — G4700 Insomnia, unspecified: Secondary | ICD-10-CM

## 2014-05-12 MED ORDER — ALPRAZOLAM 0.5 MG PO TABS: 0.5000 mg | ORAL_TABLET | Freq: Every evening | ORAL | Status: DC | PRN

## 2014-05-12 NOTE — Telephone Encounter (Signed)
Spoke with pt and got it taken care of

## 2014-05-12 NOTE — Telephone Encounter (Signed)
Patient called you back. She wanted to know why only 30 days. Please call her

## 2014-05-12 NOTE — Telephone Encounter (Signed)
Called pt to get the pharmacy she wanted me to fax her rx for xanax to. I also made her aware that [facility] does not do 90 days for controlled substances.

## 2014-05-12 NOTE — Telephone Encounter (Signed)
Medication refilled

## 2014-05-18 ENCOUNTER — Ambulatory Visit (INDEPENDENT_AMBULATORY_CARE_PROVIDER_SITE_OTHER): Payer: Medicare Other | Admitting: Thoracic Surgery (Cardiothoracic Vascular Surgery)

## 2014-05-18 ENCOUNTER — Ambulatory Visit
Admission: RE | Admit: 2014-05-18 | Discharge: 2014-05-18 | Disposition: A | Discharge status: Home / Self Care | Payer: Medicare Other | Source: Ambulatory Visit | Attending: Thoracic Surgery (Cardiothoracic Vascular Surgery) | Admitting: Thoracic Surgery (Cardiothoracic Vascular Surgery)

## 2014-05-18 ENCOUNTER — Encounter: Payer: Self-pay | Admitting: Thoracic Surgery (Cardiothoracic Vascular Surgery)

## 2014-05-18 VITALS — BP 129/74 | HR 81 | Resp 20 | Ht 62.0 in

## 2014-05-18 DIAGNOSIS — Z85110 Personal history of malignant carcinoid tumor of bronchus and lung: Secondary | ICD-10-CM | POA: Diagnosis not present

## 2014-05-18 DIAGNOSIS — D3A00 Benign carcinoid tumor of unspecified site: Secondary | ICD-10-CM

## 2014-05-18 DIAGNOSIS — R918 Other nonspecific abnormal finding of lung field: Secondary | ICD-10-CM | POA: Diagnosis not present

## 2014-05-18 NOTE — Progress Notes (Signed)
[address]       [place] [address]             [phone]       HPI:  Christina Hayes returns today for scheduled 1 year follow-up visit.  She is a 64 year old woman who presented with recurrent wheezing and respiratory infections. She was found to have an endobronchial mass in the left mainstem bronchus. I did an endobronchial resection and laser ablation in April 2015.  She was last seen in the office in October at which time she was doing well.  She now returns for a 1 year visit. She denies any wheezing or respiratory infections in the interim since her last visit. She has an occasional cough but it is rare. Her weight is stable. She feels well.  Past Medical History  Diagnosis Date  . HYPERLIPIDEMIA, MIXED 12/03/2006    PT STATES NOT TAKING MED AS PRESCRIBED  . VERTIGO, HX OF 12/03/2006  . MIGRAINES, HX OF 12/03/2006  . Allergic rhinitis, cause unspecified   . SUI (stress urinary incontinence, female)   . OSA on CPAP   . Head cold   . Cough   . Frequency of urination   . Urgency of urination   . Nocturia   . Acid reflux   . Complication of anesthesia SLOW TO WAKE  . PONV (postoperative nausea and vomiting)     in past  . Headache(784.0)     migraqine- uses essential oils  . Shortness of breath   . H/O hiatal hernia     small  . DJD (degenerative joint disease)     spine  . Allergy    Past Surgical History  Procedure Laterality Date  . Salpingoophorectomy  1995    BILATERAL  . Vaginal hysterectomy  1988  . Pubovaginal sling  04/30/2011    Procedure: Gaynelle Arabian;  Surgeon: Bernestine Amass, MD;  Location: [facility];  Service: Urology;  Laterality: N/A;  sub urethral sling    possible ower  . Video bronchoscopy Bilateral 03/26/2013    Procedure: VIDEO BRONCHOSCOPY WITHOUT FLUORO;  Surgeon: Tanda Rockers, MD;  Location: [facility];  Service: Cardiopulmonary;  Laterality: Bilateral;  . Video bronchoscopy N/A 04/23/2013     Procedure: VIDEO BRONCHOSCOPY;  Surgeon: Melrose Nakayama, MD;  Location: [facility];  Service: Thoracic;  Laterality: N/A;  . Rigid bronchoscopy N/A 04/23/2013    Procedure: LASER BRONCHOSCOPY;  Surgeon: Melrose Nakayama, MD;  Location: [facility];  Service: Thoracic;  Laterality: N/A;      Current Outpatient Prescriptions  Medication Sig Dispense Refill  . ALPRAZolam (XANAX) 0.5 MG tablet Take 1 tablet (0.5 mg total) by mouth at bedtime as needed for sleep. 90 tablet 0  . cetirizine (ZYRTEC) 10 MG tablet Take 10 mg by mouth daily.    Marland Kitchen ibuprofen (ADVIL,MOTRIN) 100 MG chewable tablet Chew 400 mg by mouth every 8 (eight) hours as needed.    . pantoprazole (PROTONIX) 40 MG tablet Take 1 tablet (40 mg total) by mouth daily. Take 30-60 min before first meal of the day 90 tablet 3  . simvastatin (ZOCOR) 40 MG tablet Take 1 tablet (40 mg total) by mouth daily. 90 tablet 3   No current facility-administered medications for this visit.    Physical Exam BP 129/74 mmHg  Pulse 81  Resp 20  Ht '5\' 2"'$  (1.575 m)  SpO78 59% 64 year old woman in no acute distress Alert and  oriented 3 with no focal deficits Neck supple with no thyromegaly or adenopathy Cardiac regular rate and rhythm normal S1 and S2 Lungs clear with equal breath sounds bilaterally, no stridor or wheezing Abdomen soft nontender No peripheral edema  Diagnostic Tests:  CT of the chest was reviewed. It is essentially a normal exam with no evidence of lung nodules or endobronchial lesions.   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 She has no evidence of recurrent disease by history physical exam or CT scan. I do think that a relook bronchoscopy is indicated to be sure that there is no early sign of recurrence. A recurrence in this area might take quite some time to show up clinically or on a CT scan. If a recurrence is found at an early  stage, we would more likely be able to treat it endobronchially rather than requiring major surgery.  I discussed the indications, risks, benefits, and alternatives with Christina Hayes. She requests that the procedure be done under general anesthesia she's had unpleasant memories of previous awake bronchoscopy. Doing the procedure under general would allow [place] to go ahead and do laser ablation if necessary. She understands the risk include those associated with general anesthesia. She also understands the risks include bleeding, airway injury, pneumothorax, as well as the possibility of unforeseeable complications.  Plan: Bronchoscopy with possible laser bronchoscopy under general anesthesia on Friday, May 6.  Melrose Nakayama, MD [facility] [phone]

## 2014-05-19 ENCOUNTER — Other Ambulatory Visit: Payer: Self-pay | Admitting: *Deleted

## 2014-05-19 DIAGNOSIS — R918 Other nonspecific abnormal finding of lung field: Secondary | ICD-10-CM

## 2014-05-25 ENCOUNTER — Encounter (HOSPITAL_COMMUNITY)
Admission: RE | Admit: 2014-05-25 | Discharge: 2014-05-25 | Disposition: A | Discharge status: Home / Self Care | Payer: Medicare Other | Source: Ambulatory Visit | Attending: Thoracic Surgery (Cardiothoracic Vascular Surgery) | Admitting: Thoracic Surgery (Cardiothoracic Vascular Surgery)

## 2014-05-25 ENCOUNTER — Ambulatory Visit (HOSPITAL_COMMUNITY)
Admission: RE | Admit: 2014-05-25 | Discharge: 2014-05-25 | Disposition: A | Discharge status: Home / Self Care | Payer: Medicare Other | Source: Ambulatory Visit | Attending: Thoracic Surgery (Cardiothoracic Vascular Surgery) | Admitting: Thoracic Surgery (Cardiothoracic Vascular Surgery)

## 2014-05-25 VITALS — BP 124/73 | HR 78 | Temp 98.1°F | Resp 18 | Ht 62.0 in | Wt 189.8 lb

## 2014-05-25 DIAGNOSIS — Z01818 Encounter for other preprocedural examination: Secondary | ICD-10-CM | POA: Diagnosis not present

## 2014-05-25 DIAGNOSIS — J309 Allergic rhinitis, unspecified: Secondary | ICD-10-CM | POA: Diagnosis not present

## 2014-05-25 DIAGNOSIS — Z09 Encounter for follow-up examination after completed treatment for conditions other than malignant neoplasm: Secondary | ICD-10-CM | POA: Diagnosis not present

## 2014-05-25 DIAGNOSIS — Z9989 Dependence on other enabling machines and devices: Secondary | ICD-10-CM | POA: Diagnosis not present

## 2014-05-25 DIAGNOSIS — E782 Mixed hyperlipidemia: Secondary | ICD-10-CM | POA: Diagnosis not present

## 2014-05-25 DIAGNOSIS — K219 Gastro-esophageal reflux disease without esophagitis: Secondary | ICD-10-CM | POA: Diagnosis not present

## 2014-05-25 DIAGNOSIS — Z79899 Other long term (current) drug therapy: Secondary | ICD-10-CM | POA: Diagnosis not present

## 2014-05-25 DIAGNOSIS — G43909 Migraine, unspecified, not intractable, without status migrainosus: Secondary | ICD-10-CM | POA: Diagnosis not present

## 2014-05-25 DIAGNOSIS — M1990 Unspecified osteoarthritis, unspecified site: Secondary | ICD-10-CM | POA: Diagnosis not present

## 2014-05-25 DIAGNOSIS — M479 Spondylosis, unspecified: Secondary | ICD-10-CM | POA: Diagnosis not present

## 2014-05-25 DIAGNOSIS — Z791 Long term (current) use of non-steroidal anti-inflammatories (NSAID): Secondary | ICD-10-CM | POA: Diagnosis not present

## 2014-05-25 DIAGNOSIS — R918 Other nonspecific abnormal finding of lung field: Secondary | ICD-10-CM

## 2014-05-25 DIAGNOSIS — G4733 Obstructive sleep apnea (adult) (pediatric): Secondary | ICD-10-CM | POA: Diagnosis not present

## 2014-05-25 LAB — COMPREHENSIVE METABOLIC PANEL
ALT: 36 U/L (ref 14–54)
AST: 35 U/L (ref 15–41)
Albumin: 4.3 g/dL (ref 3.5–5.0)
Alkaline Phosphatase: 123 U/L (ref 38–126)
Anion gap: 11 (ref 5–15)
BUN: 12 mg/dL (ref 6–20)
CO2: 25 mmol/L (ref 22–32)
Calcium: 9.7 mg/dL (ref 8.9–10.3)
Chloride: 103 mmol/L (ref 101–111)
Creatinine, Ser: 0.88 mg/dL (ref 0.44–1.00)
GFR calc Af Amer: >60 mL/min (ref >60)
GFR calc non Af Amer: >60 mL/min (ref >60)
Glucose, Bld: 97 mg/dL (ref 70–99)
Potassium: 3.9 mmol/L (ref 3.5–5.1)
Sodium: 139 mmol/L (ref 135–145)
Total Bilirubin: 0.5 mg/dL (ref 0.3–1.2)
Total Protein: 8.3 g/dL — ABNORMAL HIGH (ref 6.5–8.1)

## 2014-05-25 LAB — CBC
HEMATOCRIT: 39.4 % (ref 36.0–46.0)
HEMOGLOBIN: 12.7 g/dL (ref 12.0–15.0)
MCH: 27 pg (ref 26.0–34.0)
MCHC: 32.2 g/dL (ref 30.0–36.0)
MCV: 83.8 fL (ref 78.0–100.0)
Platelets: 361 10*3/uL (ref 150–400)
RBC: 4.7 MIL/uL (ref 3.87–5.11)
RDW: 13.8 % (ref 11.5–15.5)
WBC: 9.1 10*3/uL (ref 4.0–10.5)

## 2014-05-25 LAB — PROTIME-INR
INR: 1.04 (ref 0.00–1.49)
Prothrombin Time: 13.7 seconds (ref 11.6–15.2)

## 2014-05-25 LAB — APTT: APTT: 32 s (ref 24–37)

## 2014-05-25 NOTE — Pre-Procedure Instructions (Signed)
REGNIA MATHWIG  05/25/2014   Your procedure is scheduled on:  May 28, 2014  Report to [facility] Admitting at 5:30 AM.  Call this number if you have problems the morning of surgery: [phone]   Remember:   Do not eat food or drink liquids after midnight.   Take these medicines the morning of surgery with A SIP OF WATER: cetirizine (ZYRTEC), pantoprazole (PROTONIX)    STOP IBUPROFEN, HERBAL SUPPLEMENTS   Do not wear jewelry, make-up or nail polish.  Do not wear lotions, powders, or perfumes. You may wear deodorant.  Do not shave 48 hours prior to surgery.   Do not bring valuables to the hospital.  [facility] is not responsible for any belongings or valuables.               Contacts, dentures or bridgework may not be worn into surgery.  Leave suitcase in the car. After surgery it may be brought to your room.  For patients admitted to the hospital, discharge time is determined by your treatment team.               Patients discharged the day of surgery will not be allowed to drive home.  Name and phone number of your driver: FAMILY/FRIEND  Special Instructions: "PREPARING FOR SURGERY"   Please read over the following fact sheets that you were given: Pain Booklet, Coughing and Deep Breathing and Surgical Site Infection Prevention

## 2014-05-28 ENCOUNTER — Encounter (HOSPITAL_COMMUNITY)
Admission: RE | Disposition: A | Discharge status: Home / Self Care | Payer: Self-pay | Source: Ambulatory Visit | Attending: Thoracic Surgery (Cardiothoracic Vascular Surgery)

## 2014-05-28 ENCOUNTER — Ambulatory Visit (HOSPITAL_COMMUNITY): Payer: Medicare Other | Admitting: Certified Registered Nurse Anesthetist

## 2014-05-28 ENCOUNTER — Encounter (HOSPITAL_COMMUNITY): Payer: Self-pay | Admitting: *Deleted

## 2014-05-28 ENCOUNTER — Ambulatory Visit (HOSPITAL_COMMUNITY)
Admission: RE | Admit: 2014-05-28 | Discharge: 2014-05-28 | Disposition: A | Discharge status: Home / Self Care | Payer: Medicare Other | Source: Ambulatory Visit | Attending: Thoracic Surgery (Cardiothoracic Vascular Surgery) | Admitting: Thoracic Surgery (Cardiothoracic Vascular Surgery)

## 2014-05-28 DIAGNOSIS — G4733 Obstructive sleep apnea (adult) (pediatric): Secondary | ICD-10-CM | POA: Diagnosis not present

## 2014-05-28 DIAGNOSIS — M1990 Unspecified osteoarthritis, unspecified site: Secondary | ICD-10-CM | POA: Insufficient documentation

## 2014-05-28 DIAGNOSIS — G43909 Migraine, unspecified, not intractable, without status migrainosus: Secondary | ICD-10-CM | POA: Diagnosis not present

## 2014-05-28 DIAGNOSIS — Z791 Long term (current) use of non-steroidal anti-inflammatories (NSAID): Secondary | ICD-10-CM | POA: Insufficient documentation

## 2014-05-28 DIAGNOSIS — E782 Mixed hyperlipidemia: Secondary | ICD-10-CM | POA: Insufficient documentation

## 2014-05-28 DIAGNOSIS — Z09 Encounter for follow-up examination after completed treatment for conditions other than malignant neoplasm: Secondary | ICD-10-CM | POA: Diagnosis not present

## 2014-05-28 DIAGNOSIS — J309 Allergic rhinitis, unspecified: Secondary | ICD-10-CM | POA: Insufficient documentation

## 2014-05-28 DIAGNOSIS — Z79899 Other long term (current) drug therapy: Secondary | ICD-10-CM | POA: Diagnosis not present

## 2014-05-28 DIAGNOSIS — K219 Gastro-esophageal reflux disease without esophagitis: Secondary | ICD-10-CM | POA: Insufficient documentation

## 2014-05-28 DIAGNOSIS — R918 Other nonspecific abnormal finding of lung field: Secondary | ICD-10-CM

## 2014-05-28 DIAGNOSIS — Z85118 Personal history of other malignant neoplasm of bronchus and lung: Secondary | ICD-10-CM | POA: Diagnosis not present

## 2014-05-28 DIAGNOSIS — M479 Spondylosis, unspecified: Secondary | ICD-10-CM | POA: Insufficient documentation

## 2014-05-28 DIAGNOSIS — Z9889 Other specified postprocedural states: Secondary | ICD-10-CM | POA: Diagnosis not present

## 2014-05-28 DIAGNOSIS — Z9989 Dependence on other enabling machines and devices: Secondary | ICD-10-CM | POA: Diagnosis not present

## 2014-05-28 DIAGNOSIS — Z86012 Personal history of benign carcinoid tumor: Secondary | ICD-10-CM | POA: Diagnosis not present

## 2014-05-28 HISTORY — PX: VIDEO BRONCHOSCOPY: SHX5072

## 2014-05-28 SURGERY — BRONCHOSCOPY, VIDEO-ASSISTED
Anesthesia: General

## 2014-05-28 MED ORDER — ROCURONIUM BROMIDE 50 MG/5ML IV SOLN
INTRAVENOUS | Status: AC
  Filled 2014-05-28: qty 1

## 2014-05-28 MED ORDER — DEXAMETHASONE SODIUM PHOSPHATE 4 MG/ML IJ SOLN
INTRAMUSCULAR | Status: DC | PRN
  Administered 2014-05-28: 8 mg via INTRAVENOUS

## 2014-05-28 MED ORDER — DEXAMETHASONE SODIUM PHOSPHATE 4 MG/ML IJ SOLN
INTRAMUSCULAR | Status: AC
  Filled 2014-05-28: qty 2

## 2014-05-28 MED ORDER — PROPOFOL 10 MG/ML IV BOLUS
INTRAVENOUS | Status: AC
  Filled 2014-05-28: qty 20

## 2014-05-28 MED ORDER — GLYCOPYRROLATE 0.2 MG/ML IJ SOLN
INTRAMUSCULAR | Status: AC
  Filled 2014-05-28: qty 3

## 2014-05-28 MED ORDER — LIDOCAINE HCL (CARDIAC) 20 MG/ML IV SOLN
INTRAVENOUS | Status: AC
  Filled 2014-05-28: qty 10

## 2014-05-28 MED ORDER — STERILE WATER FOR INJECTION IJ SOLN
INTRAMUSCULAR | Status: AC
  Filled 2014-05-28: qty 10

## 2014-05-28 MED ORDER — FENTANYL CITRATE (PF) 100 MCG/2ML IJ SOLN
INTRAMUSCULAR | Status: DC | PRN
Start: 2014-05-28 — End: 2014-05-28
  Administered 2014-05-28: 150 ug via INTRAVENOUS

## 2014-05-28 MED ORDER — FENTANYL CITRATE (PF) 250 MCG/5ML IJ SOLN
INTRAMUSCULAR | Status: AC
  Filled 2014-05-28: qty 5

## 2014-05-28 MED ORDER — PROPOFOL 10 MG/ML IV BOLUS
INTRAVENOUS | Status: DC | PRN
  Administered 2014-05-28: 140 mg via INTRAVENOUS
  Administered 2014-05-28 (×3): 50 mg via INTRAVENOUS

## 2014-05-28 MED ORDER — ACETAMINOPHEN 325 MG PO TABS
325.0000 mg | ORAL_TABLET | ORAL | Status: DC | PRN
Start: 2014-05-28 — End: 2014-05-28

## 2014-05-28 MED ORDER — ACETAMINOPHEN 160 MG/5ML PO SOLN: 325.0000 mg | ORAL | Status: DC | PRN

## 2014-05-28 MED ORDER — LIDOCAINE HCL 4 % MT SOLN
OROMUCOSAL | Status: DC | PRN
  Administered 2014-05-28: 4 mL via TOPICAL

## 2014-05-28 MED ORDER — LACTATED RINGERS IV SOLN
INTRAVENOUS | Status: DC | PRN
  Administered 2014-05-28: 07:00:00 via INTRAVENOUS

## 2014-05-28 MED ORDER — FENTANYL CITRATE (PF) 100 MCG/2ML IJ SOLN: 25.0000 ug | INTRAMUSCULAR | Status: DC | PRN

## 2014-05-28 MED ORDER — ONDANSETRON HCL 4 MG/2ML IJ SOLN
INTRAMUSCULAR | Status: DC | PRN
  Administered 2014-05-28: 4 mg via INTRAVENOUS

## 2014-05-28 MED ORDER — ONDANSETRON HCL 4 MG/2ML IJ SOLN
INTRAMUSCULAR | Status: AC
  Filled 2014-05-28: qty 2

## 2014-05-28 MED ORDER — EPINEPHRINE HCL 1 MG/ML IJ SOLN
INTRAMUSCULAR | Status: AC
  Filled 2014-05-28: qty 1

## 2014-05-28 MED ORDER — EPHEDRINE SULFATE 50 MG/ML IJ SOLN
INTRAMUSCULAR | Status: AC
  Filled 2014-05-28: qty 1

## 2014-05-28 MED ORDER — DEXTROSE 5 % IV SOLN
INTRAVENOUS | Status: AC
  Filled 2014-05-28: qty 1.5

## 2014-05-28 MED ORDER — SUCCINYLCHOLINE CHLORIDE 20 MG/ML IJ SOLN
INTRAMUSCULAR | Status: DC | PRN
  Administered 2014-05-28: 40 mg via INTRAVENOUS

## 2014-05-28 MED ORDER — 0.9 % SODIUM CHLORIDE (POUR BTL) OPTIME
TOPICAL | Status: DC | PRN
  Administered 2014-05-28: 1000 mL

## 2014-05-28 MED ORDER — MIDAZOLAM HCL 5 MG/5ML IJ SOLN
INTRAMUSCULAR | Status: DC | PRN
Start: 2014-05-28 — End: 2014-05-28
  Administered 2014-05-28: 2 mg via INTRAVENOUS

## 2014-05-28 MED ORDER — PHENYLEPHRINE HCL 10 MG/ML IJ SOLN
10.0000 mg | INTRAVENOUS | Status: DC | PRN
  Administered 2014-05-28: 20 ug/min via INTRAVENOUS

## 2014-05-28 MED ORDER — SUCCINYLCHOLINE CHLORIDE 20 MG/ML IJ SOLN
INTRAMUSCULAR | Status: AC
  Filled 2014-05-28: qty 1

## 2014-05-28 MED ORDER — NEOSTIGMINE METHYLSULFATE 10 MG/10ML IV SOLN
INTRAVENOUS | Status: AC
  Filled 2014-05-28: qty 4

## 2014-05-28 MED ORDER — MIDAZOLAM HCL 2 MG/2ML IJ SOLN
INTRAMUSCULAR | Status: AC
  Filled 2014-05-28: qty 2

## 2014-05-28 MED ORDER — LIDOCAINE HCL (CARDIAC) 20 MG/ML IV SOLN
INTRAVENOUS | Status: DC | PRN
  Administered 2014-05-28: 60 mg via INTRAVENOUS

## 2014-05-28 SURGICAL SUPPLY — 46 items
ADAPTER CATH SYR TO TUBING 38M (ADAPTER) ×3 IMPLANT
BLOCK BITE 60FR ADLT L/F BLUE (MISCELLANEOUS) ×3 IMPLANT
BNDG GAUZE ELAST 4 BULKY (GAUZE/BANDAGES/DRESSINGS) IMPLANT
BRUSH CYTOL CELLEBRITY 1.5X140 (MISCELLANEOUS) IMPLANT
CANISTER SUCTION 2500CC (MISCELLANEOUS) ×3 IMPLANT
CONT SPEC 4OZ CLIKSEAL STRL BL (MISCELLANEOUS) ×18 IMPLANT
COTTONBALL LRG STERILE PKG (GAUZE/BANDAGES/DRESSINGS) IMPLANT
COVER TABLE BACK 60X90 (DRAPES) ×3 IMPLANT
DRAPE INCISE IOBAN 66X45 STRL (DRAPES) IMPLANT
FILTER STRAW FLUID ASPIR (MISCELLANEOUS) IMPLANT
FORCEPS BIOP RJ4 1.8 (CUTTING FORCEPS) IMPLANT
FORCEPS RADIAL JAW LRG 4 PULM (INSTRUMENTS) IMPLANT
GAS CARTRIDGE  LASER (MISCELLANEOUS) ×3 IMPLANT
GAUZE SPONGE 4X4 12PLY STRL (GAUZE/BANDAGES/DRESSINGS) ×3 IMPLANT
GAUZE VASELINE FOILPK 1/2 X 72 (GAUZE/BANDAGES/DRESSINGS) IMPLANT
GLOVE SS N UNI LF 7.0 STRL (GLOVE) ×3 IMPLANT
GLOVE SURG SIGNA 7.5 PF LTX (GLOVE) ×3 IMPLANT
GOWN STRL REUS W/ TWL LRG LVL3 (GOWN DISPOSABLE) ×1 IMPLANT
GOWN STRL REUS W/ TWL XL LVL3 (GOWN DISPOSABLE) ×1 IMPLANT
GOWN STRL REUS W/TWL LRG LVL3 (GOWN DISPOSABLE) ×2
GOWN STRL REUS W/TWL XL LVL3 (GOWN DISPOSABLE) ×2
GUARD TEETH (MISCELLANEOUS) IMPLANT
KIT BASIN OR (CUSTOM PROCEDURE TRAY) ×3 IMPLANT
KIT CLEAN ENDO COMPLIANCE (KITS) ×3 IMPLANT
KIT ROOM TURNOVER OR (KITS) ×3 IMPLANT
LASER FIBER FLEXIBLE (MISCELLANEOUS) ×3 IMPLANT
NEEDLE 22X1 1/2 (OR ONLY) (NEEDLE) IMPLANT
NEEDLE BIOPSY TRANSBRONCH 21G (NEEDLE) IMPLANT
NEEDLE BLUNT 16X1.5 OR ONLY (NEEDLE) ×3 IMPLANT
NS IRRIG 1000ML POUR BTL (IV SOLUTION) ×6 IMPLANT
PAD ARMBOARD 7.5X6 YLW CONV (MISCELLANEOUS) ×6 IMPLANT
RADIAL JAW LRG 4 PULMONARY (INSTRUMENTS)
SNARE SHORT THROW 13M SML OVAL (MISCELLANEOUS) IMPLANT
SOLUTION ANTI FOG 6CC (MISCELLANEOUS) ×3 IMPLANT
SYR 20ML ECCENTRIC (SYRINGE) ×9 IMPLANT
SYR 5ML LL (SYRINGE) ×3 IMPLANT
SYR 5ML LUER SLIP (SYRINGE) ×3 IMPLANT
SYR BULB IRRIGATION 50ML (SYRINGE) ×3 IMPLANT
SYR CONTROL 10ML LL (SYRINGE) IMPLANT
SYR TOOMEY 50ML (SYRINGE) IMPLANT
TOWEL OR 17X24 6PK STRL BLUE (TOWEL DISPOSABLE) ×3 IMPLANT
TOWEL OR 17X26 10 PK STRL BLUE (TOWEL DISPOSABLE) ×3 IMPLANT
TRAP SPECIMEN MUCOUS 40CC (MISCELLANEOUS) ×6 IMPLANT
TUBE CONNECTING 20'X1/4 (TUBING) ×1
TUBE CONNECTING 20X1/4 (TUBING) ×2 IMPLANT
WATER STERILE IRR 1000ML POUR (IV SOLUTION) IMPLANT

## 2014-05-28 NOTE — Discharge Instructions (Signed)
Do not drive or engage in heavy physical activity for 24 hours  You may resume normal activity tomorrow  Call [phone] if you develop chest pain, shortness of breath, or fever > 101  My office will contact you for a follow up appointment in 1 year

## 2014-05-28 NOTE — Anesthesia Preprocedure Evaluation (Addendum)
Anesthesia Evaluation  Patient identified by MRN, date of birth, ID band Patient awake    Reviewed: Allergy & Precautions, NPO status , Patient's Chart, lab work & pertinent test results  History of Anesthesia Complications Negative for: history of anesthetic complications  Airway Mallampati: III  TM Distance: >3 FB Neck ROM: Full    Dental  (+) Dental Advisory Given, Teeth Intact   Pulmonary shortness of breath, sleep apnea and Continuous Positive Airway Pressure Ventilation ,  breath sounds clear to auscultation        Cardiovascular negative cardio ROS  Rhythm:Regular     Neuro/Psych  Headaches, negative psych ROS   GI/Hepatic Neg liver ROS, hiatal hernia, GERD-  Medicated and Controlled,  Endo/Other  Morbid obesity  Renal/GU negative Renal ROS     Musculoskeletal  (+) Arthritis -,   Abdominal   Peds  Hematology   Anesthesia Other Findings   Reproductive/Obstetrics                            Anesthesia Physical Anesthesia Plan  ASA: III  Anesthesia Plan: General   Post-op Pain Management:    Induction: Intravenous  Airway Management Planned: Oral ETT  Additional Equipment: None  Intra-op Plan:   Post-operative Plan: Extubation in OR  Informed Consent: I have reviewed the patients History and Physical, chart, labs and discussed the procedure including the risks, benefits and alternatives for the proposed anesthesia with the patient or authorized representative who has indicated his/her understanding and acceptance.   Dental advisory given  Plan Discussed with: CRNA, Anesthesiologist and Surgeon  Anesthesia Plan Comments:        Anesthesia Quick Evaluation

## 2014-05-28 NOTE — Brief Op Note (Signed)
05/28/2014  8:06 AM  PATIENT:  Christina Hayes  64 y.o. female  PRE-OPERATIVE DIAGNOSIS:  HX OF ENDOBRONCHIAL RESECTION OF CARCINOID  POST-OPERATIVE DIAGNOSIS:  HX OF ENDOBRONCHIAL RESECTION OF CARCINOID  PROCEDURE:  Procedure(s): VIDEO BRONCHOSCOPY (N/A)  SURGEON:  Surgeon(s) and Role:    * Melrose Nakayama, MD - Primary  PHYSICIAN ASSISTANT:   ASSISTANTS: none   ANESTHESIA:   general  EBL:  Total I/O In: 500 [I.V.:500] Out: -   BLOOD ADMINISTERED:none  LOCAL MEDICATIONS USED:  NONE  SPECIMEN:  No Specimen  DISPOSITION OF SPECIMEN:  N/A  PLAN OF CARE: Discharge to home after PACU  PATIENT DISPOSITION:  PACU - hemodynamically stable.   Delay start of Pharmacological VTE agent (>24hrs) due to surgical blood loss or risk of bleeding: not applicable  FINDINGS- normal endobronchial anatomy, no endobronchial lesions

## 2014-05-28 NOTE — Interval H&P Note (Signed)
History and Physical Interval Note:  05/28/2014 7:12 AM  Christina Hayes  has presented today for surgery, with the diagnosis of HX OF ENDOBRONCHIAL RESECTIO OF CARCINOID  The various methods of treatment have been discussed with the patient and family. After consideration of risks, benefits and other options for treatment, the patient has consented to  Procedure(s): VIDEO BRONCHOSCOPY (N/A) POSSIBLE LASER BRONCHOSCOPY (N/A) as a surgical intervention .  The patient's history has been reviewed, patient examined, no change in status, stable for surgery.  I have reviewed the patient's chart and labs.  Questions were answered to the patient's satisfaction.     Melrose Nakayama

## 2014-05-28 NOTE — Anesthesia Postprocedure Evaluation (Signed)
  Anesthesia Post-op Note  Patient: Christina Hayes  Procedure(s) Performed: Procedure(s): VIDEO BRONCHOSCOPY (N/A)  Patient Location: PACU  Anesthesia Type:General  Level of Consciousness: awake  Airway and Oxygen Therapy: Patient Spontanous Breathing  Post-op Pain: none  Post-op Assessment: Post-op Vital signs reviewed, Patient's Cardiovascular Status Stable, Respiratory Function Stable, Patent Airway, No signs of Nausea or vomiting and Pain level controlled  Post-op Vital Signs: Reviewed and stable  Last Vitals:  Filed Vitals:   05/28/14 0925  BP: 128/61  Pulse:   Temp: 36.6 C  Resp:     Complications: No apparent anesthesia complications

## 2014-05-28 NOTE — Op Note (Signed)
[name], [name]                ACCOUNT NO.:  [id number]  MEDICAL RECORD NO.:  [id number]  LOCATION:  [facility]                         FACILITY:  [facility]  PHYSICIAN:  Revonda Standard. Roxan Hockey, M.D.DATE OF BIRTH:  [birth year]  DATE OF PROCEDURE:  05/28/2014 DATE OF DISCHARGE:                              OPERATIVE REPORT   PREOPERATIVE DIAGNOSIS:  History of endobronchial resection of carcinoid tumor.  POSTOPERATIVE DIAGNOSIS:  History of endobronchial resection of carcinoid tumor.  PROCEDURE:  Video bronchoscopy.  SURGEON:  Revonda Standard. Roxan Hockey, MD  ASSISTANT:  None.  ANESTHESIA:  General.  FINDINGS:  Normal endobronchial anatomy.  No endobronchial lesions seen to the level of the subsegmental bronchi.  CLINICAL NOTE:  [name] is a 64 year old woman who had an endobronchial resection of a carcinoid tumor from the left mainstem bronchus in April 2015.  She now returns for 1 year followup and surveillance bronchoscopy to rule out evidence of recurrence.  The indications, risks, benefits, and alternatives were discussed in detail with the patient.  She understood and accepted the risks and agreed to proceed.  OPERATIVE NOTE:  [name] was brought to the operating room on May 28, 2014.  She was anesthetized and intubated.  Flexible fiberoptic bronchoscopy was performed via the endotracheal tube.  There was normal endobronchial anatomy.  No endobronchial lesions were seen to the level of the subsegmental bronchi bilaterally.  Of note, the area of the previous endobronchial resection showed good healing with no mucosal abnormality.  The patient was extubated in the operating room and taken to the postanesthetic care unit in good condition.     Revonda Standard Roxan Hockey, M.D.     SCH/MEDQ  D:  05/28/2014  T:  05/28/2014  Job:  [id number]

## 2014-05-28 NOTE — H&P (View-Only) (Signed)
[address]       [place] [address]             [phone]       HPI:  Christina Hayes returns today for scheduled 1 year follow-up visit.  She is a 64 year old woman who presented with recurrent wheezing and respiratory infections. She was found to have an endobronchial mass in the left mainstem bronchus. I did an endobronchial resection and laser ablation in April 2015.  She was last seen in the office in October at which time she was doing well.  She now returns for a 1 year visit. She denies any wheezing or respiratory infections in the interim since her last visit. She has an occasional cough but it is rare. Her weight is stable. She feels well.  Past Medical History  Diagnosis Date  . HYPERLIPIDEMIA, MIXED 12/03/2006    PT STATES NOT TAKING MED AS PRESCRIBED  . VERTIGO, HX OF 12/03/2006  . MIGRAINES, HX OF 12/03/2006  . Allergic rhinitis, cause unspecified   . SUI (stress urinary incontinence, female)   . OSA on CPAP   . Head cold   . Cough   . Frequency of urination   . Urgency of urination   . Nocturia   . Acid reflux   . Complication of anesthesia SLOW TO WAKE  . PONV (postoperative nausea and vomiting)     in past  . Headache(784.0)     migraqine- uses essential oils  . Shortness of breath   . H/O hiatal hernia     small  . DJD (degenerative joint disease)     spine  . Allergy    Past Surgical History  Procedure Laterality Date  . Salpingoophorectomy  1995    BILATERAL  . Vaginal hysterectomy  1988  . Pubovaginal sling  04/30/2011    Procedure: Gaynelle Arabian;  Surgeon: Bernestine Amass, MD;  Location[facility];  Service: Urology;  Laterality: N/A;  sub urethral sling    possible ower  . Video bronchoscopy Bilateral 03/26/2013    Procedure: VIDEO BRONCHOSCOPY WITHOUT FLUORO;  Surgeon: Tanda Rockers, MD;  Location: [facility];  Service: Cardiopulmonary;  Laterality: Bilateral;  . Video bronchoscopy N/A 04/23/2013     Procedure: VIDEO BRONCHOSCOPY;  Surgeon: Melrose Nakayama, MD;  Location: [facility];  Service: Thoracic;  Laterality: N/A;  . Rigid bronchoscopy N/A 04/23/2013    Procedure: LASER BRONCHOSCOPY;  Surgeon: Melrose Nakayama, MD;  Location: [facility];  Service: Thoracic;  Laterality: N/A;      Current Outpatient Prescriptions  Medication Sig Dispense Refill  . ALPRAZolam (XANAX) 0.5 MG tablet Take 1 tablet (0.5 mg total) by mouth at bedtime as needed for sleep. 90 tablet 0  . cetirizine (ZYRTEC) 10 MG tablet Take 10 mg by mouth daily.    Marland Kitchen ibuprofen (ADVIL,MOTRIN) 100 MG chewable tablet Chew 400 mg by mouth every 8 (eight) hours as needed.    . pantoprazole (PROTONIX) 40 MG tablet Take 1 tablet (40 mg total) by mouth daily. Take 30-60 min before first meal of the day 90 tablet 3  . simvastatin (ZOCOR) 40 MG tablet Take 1 tablet (40 mg total) by mouth daily. 90 tablet 3   No current facility-administered medications for this visit.    Physical Exam BP 129/74 mmHg  Pulse 81  Resp 20  Ht '5\' 2"'$  (1.575 m)  SpO28 77% 64 year old woman in no acute distress Alert and  oriented 3 with no focal deficits Neck supple with no thyromegaly or adenopathy Cardiac regular rate and rhythm normal S1 and S2 Lungs clear with equal breath sounds bilaterally, no stridor or wheezing Abdomen soft nontender No peripheral edema  Diagnostic Tests:  CT of the chest was reviewed. It is essentially a normal exam with no evidence of lung nodules or endobronchial lesions.   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 She has no evidence of recurrent disease by history physical exam or CT scan. I do think that a relook bronchoscopy is indicated to be sure that there is no early sign of recurrence. A recurrence in this area might take quite some time to show up clinically or on a CT scan. If a recurrence is found at an early  stage, we would more likely be able to treat it endobronchially rather than requiring major surgery.  I discussed the indications, risks, benefits, and alternatives with Christina Hayes. She requests that the procedure be done under general anesthesia she's had unpleasant memories of previous awake bronchoscopy. Doing the procedure under general would allow [place] to go ahead and do laser ablation if necessary. She understands the risk include those associated with general anesthesia. She also understands the risks include bleeding, airway injury, pneumothorax, as well as the possibility of unforeseeable complications.  Plan: Bronchoscopy with possible laser bronchoscopy under general anesthesia on Friday, May 6.  Melrose Nakayama, MD [facility] [phone]

## 2014-05-28 NOTE — Transfer of Care (Signed)
Immediate Anesthesia Transfer of Care Note  Patient: Christina Hayes  Procedure(s) Performed: Procedure(s): VIDEO BRONCHOSCOPY (N/A)  Patient Location: PACU  Anesthesia Type:General  Level of Consciousness: awake, alert  and oriented  Airway & Oxygen Therapy: Patient Spontanous Breathing and Patient connected to nasal cannula oxygen  Post-op Assessment: Report given to RN, Post -op Vital signs reviewed and stable and Patient moving all extremities X 4  Post vital signs: Reviewed and stable  Last Vitals:  Filed Vitals:   05/28/14 0602  BP: 143/57  Pulse: 71  Temp: 36.6 C  Resp: 18    Complications: No apparent anesthesia complications

## 2014-05-28 NOTE — Anesthesia Procedure Notes (Signed)
Procedure Name: Intubation Date/Time: 05/28/2014 7:38 AM Performed by: Garrison Columbus T Pre-anesthesia Checklist: Patient identified, Emergency Drugs available, Suction available and Patient being monitored Patient Re-evaluated:Patient Re-evaluated prior to inductionOxygen Delivery Method: Circle system utilized Preoxygenation: Pre-oxygenation with 100% oxygen Intubation Type: IV induction Ventilation: Mask ventilation without difficulty Laryngoscope Size: Miller and 2 Grade View: Grade I Tube type: Oral Tube size: 8.5 mm Number of attempts: 1 Airway Equipment and Method: Stylet Placement Confirmation: ETT inserted through vocal cords under direct vision,  positive ETCO2 and breath sounds checked- equal and bilateral Secured at: 20 cm Tube secured with: Tape Dental Injury: Teeth and Oropharynx as per pre-operative assessment

## 2014-05-31 ENCOUNTER — Encounter (HOSPITAL_COMMUNITY): Payer: Self-pay | Admitting: Thoracic Surgery (Cardiothoracic Vascular Surgery)

## 2014-06-09 ENCOUNTER — Telehealth: Payer: Self-pay

## 2014-06-09 ENCOUNTER — Encounter: Payer: Self-pay | Admitting: Family

## 2014-06-09 NOTE — Telephone Encounter (Signed)
[facility] called and stated that this pts xanax was sent in the mail but she did not sign for it and she has been without this medication for 1 week. They are calling to get permission to resend it in the mail. Please advise.

## 2014-06-09 NOTE — Telephone Encounter (Signed)
Yes that is fine

## 2014-06-10 NOTE — Telephone Encounter (Signed)
Called back [facility] and gave them permission to resent package if needed.

## 2014-09-08 ENCOUNTER — Other Ambulatory Visit: Payer: Self-pay | Admitting: Family

## 2014-09-14 ENCOUNTER — Other Ambulatory Visit: Payer: Self-pay | Admitting: Family

## 2014-09-14 ENCOUNTER — Encounter: Payer: Self-pay | Admitting: Family

## 2014-09-14 DIAGNOSIS — G4700 Insomnia, unspecified: Secondary | ICD-10-CM

## 2014-09-14 MED ORDER — ALPRAZOLAM 0.5 MG PO TABS: 0.5000 mg | ORAL_TABLET | Freq: Every evening | ORAL | Status: DC | PRN

## 2014-09-30 ENCOUNTER — Encounter: Payer: Self-pay | Admitting: Family

## 2014-12-07 ENCOUNTER — Other Ambulatory Visit: Payer: Self-pay

## 2014-12-07 DIAGNOSIS — Z1231 Encounter for screening mammogram for malignant neoplasm of breast: Secondary | ICD-10-CM

## 2014-12-20 ENCOUNTER — Other Ambulatory Visit: Payer: Self-pay | Admitting: Family

## 2014-12-20 DIAGNOSIS — G4700 Insomnia, unspecified: Secondary | ICD-10-CM

## 2014-12-20 MED ORDER — ALPRAZOLAM 0.5 MG PO TABS: 0.5000 mg | ORAL_TABLET | Freq: Every evening | ORAL | Status: DC | PRN

## 2014-12-20 NOTE — Addendum Note (Signed)
Addended by: Mauricio Po D on: 12/20/2014 05:30 PM   Modules accepted: Orders

## 2015-01-11 ENCOUNTER — Ambulatory Visit
Admission: RE | Admit: 2015-01-11 | Discharge: 2015-01-11 | Disposition: A | Discharge status: Home / Self Care | Payer: Medicare Other | Source: Ambulatory Visit

## 2015-01-11 DIAGNOSIS — Z1231 Encounter for screening mammogram for malignant neoplasm of breast: Secondary | ICD-10-CM | POA: Diagnosis not present

## 2015-01-14 ENCOUNTER — Other Ambulatory Visit: Payer: Self-pay | Admitting: Family

## 2015-04-17 ENCOUNTER — Encounter (HOSPITAL_COMMUNITY): Payer: Self-pay

## 2015-04-17 ENCOUNTER — Ambulatory Visit (INDEPENDENT_AMBULATORY_CARE_PROVIDER_SITE_OTHER): Payer: Medicare Other | Admitting: Family Medicine

## 2015-04-17 ENCOUNTER — Emergency Department (HOSPITAL_COMMUNITY)
Admission: EM | Admit: 2015-04-17 | Discharge: 2015-04-17 | Disposition: A | Discharge status: Home / Self Care | Payer: Medicare Other | Attending: Emergency Medicine | Admitting: Emergency Medicine

## 2015-04-17 VITALS — BP 118/78 | HR 102 | Temp 98.2°F | Resp 18 | Ht 63.0 in | Wt 198.5 lb

## 2015-04-17 DIAGNOSIS — R220 Localized swelling, mass and lump, head: Secondary | ICD-10-CM | POA: Diagnosis not present

## 2015-04-17 DIAGNOSIS — K219 Gastro-esophageal reflux disease without esophagitis: Secondary | ICD-10-CM | POA: Insufficient documentation

## 2015-04-17 DIAGNOSIS — E782 Mixed hyperlipidemia: Secondary | ICD-10-CM | POA: Insufficient documentation

## 2015-04-17 DIAGNOSIS — Z9981 Dependence on supplemental oxygen: Secondary | ICD-10-CM | POA: Diagnosis not present

## 2015-04-17 DIAGNOSIS — T783XXA Angioneurotic edema, initial encounter: Secondary | ICD-10-CM | POA: Diagnosis not present

## 2015-04-17 DIAGNOSIS — J029 Acute pharyngitis, unspecified: Secondary | ICD-10-CM

## 2015-04-17 DIAGNOSIS — K148 Other diseases of tongue: Secondary | ICD-10-CM

## 2015-04-17 DIAGNOSIS — Z8679 Personal history of other diseases of the circulatory system: Secondary | ICD-10-CM | POA: Insufficient documentation

## 2015-04-17 DIAGNOSIS — Z79899 Other long term (current) drug therapy: Secondary | ICD-10-CM | POA: Diagnosis not present

## 2015-04-17 DIAGNOSIS — Z8742 Personal history of other diseases of the female genital tract: Secondary | ICD-10-CM | POA: Diagnosis not present

## 2015-04-17 DIAGNOSIS — Z8739 Personal history of other diseases of the musculoskeletal system and connective tissue: Secondary | ICD-10-CM | POA: Insufficient documentation

## 2015-04-17 DIAGNOSIS — G4733 Obstructive sleep apnea (adult) (pediatric): Secondary | ICD-10-CM | POA: Insufficient documentation

## 2015-04-17 LAB — POCT RAPID STREP A (OFFICE): Rapid Strep A Screen: NEGATIVE

## 2015-04-17 MED ORDER — DIPHENHYDRAMINE HCL 25 MG PO CAPS
25.0000 mg | ORAL_CAPSULE | Freq: Once | ORAL | Status: AC
  Administered 2015-04-17: 25 mg via ORAL
  Filled 2015-04-17: qty 1

## 2015-04-17 MED ORDER — PREDNISONE 20 MG PO TABS: 40.0000 mg | ORAL_TABLET | Freq: Every day | ORAL | Status: DC

## 2015-04-17 MED ORDER — PREDNISONE 20 MG PO TABS
60.0000 mg | ORAL_TABLET | Freq: Once | ORAL | Status: AC
Start: 2015-04-17 — End: 2015-04-17
  Administered 2015-04-17: 60 mg via ORAL
  Filled 2015-04-17: qty 3

## 2015-04-17 MED ORDER — RANITIDINE HCL 150 MG PO TABS
150.0000 mg | ORAL_TABLET | Freq: Once | ORAL | Status: AC
  Administered 2015-04-17: 150 mg via ORAL

## 2015-04-17 NOTE — ED Notes (Signed)
She states she has felt as if her tongue were somewhat swollen for some time now.  She saw a doctor at a local urgent care, who recommended she come here to be checked.  She is in no distress and is not having any difficulty moving air and does not have either wheezing nor stridor.

## 2015-04-17 NOTE — Discharge Instructions (Signed)
Take Benadryl as directed for the next 24 hours. Return here at once for any trouble swallowing, severe tongue swelling, or any other problems

## 2015-04-17 NOTE — ED Provider Notes (Signed)
CSN: [id number]     Arrival date & time 04/17/15  1749 History   First MD Initiated Contact with Patient 04/17/15 1812     Chief Complaint  Patient presents with  . Oral Swelling     (Consider location/radiation/quality/duration/timing/severity/associated sxs/prior Treatment) HPI Comments: Patient here noting tongue swelling for some time that is now worse today. Denies any trouble handling her secretions. No rashes or pruritus at this time. Denies any new medications or use of Ace inhibitors. Seen at urgent care center and I discussed her case with the physician and she was given Zantac prior to arrival. No fever or chills. No recent chemical exposures. Has had tongue swelling that was noted recently but she states today is worse. Nothing makes her symptoms better.  The history is provided by the patient, medical records and the spouse.    Past Medical History  Diagnosis Date  . HYPERLIPIDEMIA, MIXED 12/03/2006    PT STATES NOT TAKING MED AS PRESCRIBED  . VERTIGO, HX OF 12/03/2006  . MIGRAINES, HX OF 12/03/2006  . Allergic rhinitis, cause unspecified   . SUI (stress urinary incontinence, female)   . OSA on CPAP   . Head cold   . Cough   . Frequency of urination   . Urgency of urination   . Nocturia   . Acid reflux   . Complication of anesthesia SLOW TO WAKE  . PONV (postoperative nausea and vomiting)     in past  . Headache(784.0)     migraqine- uses essential oils  . Shortness of breath   . H/O hiatal hernia     small  . DJD (degenerative joint disease)     spine  . Allergy    Past Surgical History  Procedure Laterality Date  . Salpingoophorectomy  1995    BILATERAL  . Vaginal hysterectomy  1988  . Pubovaginal sling  04/30/2011    Procedure: Gaynelle Arabian;  Surgeon: Bernestine Amass, MD;  Location: [facility];  Service: Urology;  Laterality: N/A;  sub urethral sling    possible ower  . Video bronchoscopy Bilateral 03/26/2013    Procedure: VIDEO  BRONCHOSCOPY WITHOUT FLUORO;  Surgeon: Tanda Rockers, MD;  Location: [facility];  Service: Cardiopulmonary;  Laterality: Bilateral;  . Video bronchoscopy N/A 04/23/2013    Procedure: VIDEO BRONCHOSCOPY;  Surgeon: Melrose Nakayama, MD;  Location: [place];  Service: Thoracic;  Laterality: N/A;  . Rigid bronchoscopy N/A 04/23/2013    Procedure: LASER BRONCHOSCOPY;  Surgeon: Melrose Nakayama, MD;  Location: [place];  Service: Thoracic;  Laterality: N/A;  . Video bronchoscopy N/A 05/28/2014    Procedure: VIDEO BRONCHOSCOPY;  Surgeon: Melrose Nakayama, MD;  Location: [place];  Service: Thoracic;  Laterality: N/A;   Family History  Problem Relation Age of Onset  . Cancer Mother     Colon Cancer survivor  . Diabetes Mother   . Diabetes Father   . Hyperlipidemia Father   . Alzheimer's disease Father   . Cancer Maternal Grandmother     Breast Cancer  . Cancer Daughter 9    stage III-B breast CA  . Allergies Daughter    Social History  Substance Use Topics  . Smoking status: Never Smoker   . Smokeless tobacco: Never Used  . Alcohol Use: No   OB History    No data available     Review of Systems  All other systems reviewed and are negative.     Allergies  Codeine;  Tramadol; and Vivactil  Home Medications   Prior to Admission medications   Medication Sig Start Date End Date Taking? Authorizing Provider  ALPRAZolam Duanne Moron) 0.5 MG tablet Take 1 tablet (0.5 mg total) by mouth at bedtime as needed for sleep. Patient taking differently: Take 0.5 mg by mouth at bedtime.  12/20/14  Yes Golden Circle, FNP  cetirizine (ZYRTEC) 10 MG tablet Take 10 mg by mouth daily.   Yes Historical Provider, MD  pantoprazole (PROTONIX) 40 MG tablet Take 1 tablet by mouth  daily 30 to 60 minutes  before first meal of the  day 01/14/15  Yes Golden Circle, FNP  ranitidine (ZANTAC) 150 MG tablet Take 150 mg by mouth once.   Yes Historical Provider, MD  simvastatin (ZOCOR) 40 MG tablet Take 1 tablet  by mouth  daily 01/14/15  Yes Golden Circle, FNP   BP 158/82 mmHg  Pulse 99  Temp(Src) 98.6 F (37 C) (Oral)  Resp 18  SpO2 98% Physical Exam  Constitutional: She is oriented to person, place, and time. She appears well-developed and well-nourished.  Non-toxic appearance. No distress.  HENT:  Head: Normocephalic and atraumatic.  No lip swelling. Tongue with possible mild swelling but no appearance that would indicate angioedema. No evidence of lesions inside the patient's oropharynx. No stridor noted. No submental swelling noted.  Eyes: Conjunctivae, EOM and lids are normal. Pupils are equal, round, and reactive to light.  Neck: Normal range of motion. Neck supple. No tracheal deviation present. No thyroid mass present.  Cardiovascular: Normal rate, regular rhythm and normal heart sounds.  Exam reveals no gallop.   No murmur heard. Pulmonary/Chest: Effort normal and breath sounds normal. No stridor. No respiratory distress. She has no decreased breath sounds. She has no wheezes. She has no rhonchi. She has no rales.  Abdominal: Soft. Normal appearance and bowel sounds are normal. She exhibits no distension. There is no tenderness. There is no rebound and no CVA tenderness.  Musculoskeletal: Normal range of motion. She exhibits no edema or tenderness.  Neurological: She is alert and oriented to person, place, and time. She has normal strength. No cranial nerve deficit or sensory deficit. GCS eye subscore is 4. GCS verbal subscore is 5. GCS motor subscore is 6.  Skin: Skin is warm and dry. No abrasion and no rash noted.  Psychiatric: She has a normal mood and affect. Her speech is normal and behavior is normal.  Nursing note and vitals reviewed.   ED Course  Procedures (including critical care time) Labs Review Labs Reviewed - No data to display  Imaging Review No results found. I have personally reviewed and evaluated these images and lab results as part of my medical  decision-making.   EKG Interpretation None      MDM   Final diagnoses:  None    Patient given prednisone and Benadryl here. Patient notes subjective tongue edema however I do not appreciate much of this. States her voice sounds different but she is speaking clearly. We'll treat possible allergic reaction and return precautions given    Lacretia Leigh, MD 04/17/15 [phone]

## 2015-04-17 NOTE — Patient Instructions (Addendum)
IF you received an x-ray today, you will receive an invoice from [facility]. Please contact [facility] at [phone] with questions or concerns regarding your invoice.   IF you received labwork today, you will receive an invoice from [employer]. Please contact [facility] at [phone] with questions or concerns regarding your invoice.   Our billing staff will not be able to assist you with questions regarding bills from these companies.  You will be contacted with the lab results as soon as they are available. The fastest way to get your results is to activate your My Chart account. Instructions are located on the last page of this paperwork. If you have not heard from [place] regarding the results in 2 weeks, please contact this office.     With your dentist noting the swelling in your tongue 2 weeks ago, tongue swelling less likely angioedema, more likely macroglossia  (or enlarged tongue). This can be evaluated as an outpatient typically.   However with the acute worsening of swelling feeling today, angioedema is also possible. Angioedema as you can see below can come from many causes.  Sometimes this is associated with a medication, but sometimes can be idiopathic or no known cause. You can be evaluated tonight by the emergency room to determine if they feel you are safe to watch and monitor your symptoms at home, and to determine if other medicines needed besides your usual zyrtec and the zantac I gave you here.  If you are released from the emergency room tonight, recommend follow-up with your primary care provider or here in the next few days for other evaluation of the enlarged tongue.  Angioedema Angioedema is a sudden swelling of tissues, often of the skin. It can occur on the face or genitals or in the abdomen or other body parts. The swelling usually develops over a short period and gets better in 24 to 48 hours. It often begins during  the night and is found when the person wakes up. The person may also get red, itchy patches of skin (hives). Angioedema can be dangerous if it involves swelling of the air passages.  Depending on the cause, episodes of angioedema may only happen once, come back in unpredictable patterns, or repeat for several years and then gradually fade away.  CAUSES  Angioedema can be caused by an allergic reaction to various triggers. It can also result from nonallergic causes, including reactions to drugs, immune system disorders, viral infections, or an abnormal gene that is passed to you from your parents (hereditary). For some people with angioedema, the cause is unknown.  Some things that can trigger angioedema include:   Foods.   Medicines, such as ACE inhibitors, ARBs, nonsteroidal anti-inflammatory agents, or estrogen.   Latex.   Animal saliva.   Insect stings.   Dyes used in X-rays.   Mild injury.   Dental work.  Surgery.  Stress.   Sudden changes in temperature.   Exercise. SIGNS AND SYMPTOMS   Swelling of the skin.  Hives. If these are present, there is also intense itching.  Redness in the affected area.   Pain in the affected area.  Swollen lips or tongue.  Breathing problems. This may happen if the air passages swell.  Wheezing. If internal organs are involved, there may be:   Nausea.   Abdominal pain.   Vomiting.   Difficulty swallowing.   Difficulty passing urine. DIAGNOSIS   Your health care provider will examine the affected  area and take a medical and family history.  Various tests may be done to help determine the cause. Tests may include:  Allergy skin tests to see if the problem is an allergic reaction.   Blood tests to check for hereditary angioedema.   Tests to check for underlying diseases that could cause the condition.   A review of your medicines, including over-the-counter medicines, may be done. TREATMENT   Treatment will depend on the cause of the angioedema. Possible treatments include:   Removal of anything that triggered the condition (such as stopping certain medicines).   Medicines to treat symptoms or prevent attacks. Medicines given may include:   Antihistamines.   Epinephrine injection.   Steroids.   Hospitalization may be required for severe attacks. If the air passages are affected, it can be an emergency. Tubes may need to be placed to keep the airway open. HOME CARE INSTRUCTIONS   Take all medicines as directed by your health care provider.  If you were given medicines for emergency allergy treatment, always carry them with you.  Wear a medical bracelet as directed by your health care provider.   Avoid known triggers. SEEK MEDICAL CARE IF:   You have repeat attacks of angioedema.   Your attacks are more frequent or more severe despite preventive measures.   You have hereditary angioedema and are considering having children. It is important to discuss with your health care provider the risks of passing the condition on to your children. SEEK IMMEDIATE MEDICAL CARE IF:   You have severe swelling of the mouth, tongue, or lips.  You have difficulty breathing.   You have difficulty swallowing.   You faint. MAKE SURE YOU:  Understand these instructions.  Will watch your condition.  Will get help right away if you are not doing well or get worse.   This information is not intended to replace advice given to you by your health care provider. Make sure you discuss any questions you have with your health care provider.   Document Released: 03/19/2001 Document Revised: 01/29/2014 Document Reviewed: 09/01/2012 Elsevier Interactive Patient Education [employer].

## 2015-04-17 NOTE — Progress Notes (Addendum)
Subjective:    Patient ID: Christina Hayes, female    DOB: [birth year], 65 y.o.   MRN: [id number] By signing my name below, I, Zola Button, attest that this documentation has been prepared under the direction and in the presence of Merri Ray, MD.  Electronically Signed: Zola Button, Medical Scribe. 04/17/2015. 4:20 PM.  HPI HPI Comments: Christina Hayes is a 65 y.o. female with a history of asthma, sleep apnea on CPAP, carcinoid tumor (removed from left mainstem bronchus April 2015) and HLD who presents to the [facility] complaining of tongue swelling that worsened today. She had a 1 year follow-up bronchoscopy May 2016, normal, no lesions. Here today for tongue swelling noted by her dentist at her visit 2-3 weeks ago. Over the past few years, patient notes her front teeth have spread out and moved forward. She has noticed indentations on her tongue from her teeth. She thinks her tongue may have been enlarged for some time, which may have caused the indentations. Patient began having a sore throat last night. She also has some rhinorrhea at baseline which is unchanged. She feels that her tongue is more swollen than usual today. She did not notice much swelling when she saw her dentist a few weeks ago. Patient denies fever, cough, congestion, rash, wheezing, SOB, and difficulty swallowing. She has been eating and drinking normally. She is not on any ACE inhibitors. She has not seen an ENT for several years. Patient takes Zyrtec and Xanax every night. She notes that she used to have low iron. She denies new medications, history of anemia, and history of other nutrient or vitamin B12 deficiencies.  PCP is Mauricio Po, FNP. She states she is due for a CPE soon.  Patient Active Problem List   Diagnosis Date Noted  . Carcinoid tumor 05/18/2014  . Sore throat 02/25/2014  . Routine general medical examination at a health care facility 01/26/2014  . Sleep pattern disturbance  01/26/2014  . Back pain 01/26/2014  . Cough variant asthma 02/06/2013  . Obesity, unspecified 03/25/2012  . Eczema 01/25/2011  . Asthma, chronic 04/04/2010  . ALLERGIC RHINITIS CAUSE UNSPECIFIED 03/28/2010  . URI 05/20/2007  . HYPERLIPIDEMIA, MIXED 12/03/2006  . VERTIGO, HX OF 12/03/2006  . MIGRAINES, HX OF 12/03/2006   Past Medical History  Diagnosis Date  . HYPERLIPIDEMIA, MIXED 12/03/2006    PT STATES NOT TAKING MED AS PRESCRIBED  . VERTIGO, HX OF 12/03/2006  . MIGRAINES, HX OF 12/03/2006  . Allergic rhinitis, cause unspecified   . SUI (stress urinary incontinence, female)   . OSA on CPAP   . Head cold   . Cough   . Frequency of urination   . Urgency of urination   . Nocturia   . Acid reflux   . Complication of anesthesia SLOW TO WAKE  . PONV (postoperative nausea and vomiting)     in past  . Headache(784.0)     migraqine- uses essential oils  . Shortness of breath   . H/O hiatal hernia     small  . DJD (degenerative joint disease)     spine  . Allergy    Past Surgical History  Procedure Laterality Date  . Salpingoophorectomy  1995    BILATERAL  . Vaginal hysterectomy  1988  . Pubovaginal sling  04/30/2011    Procedure: Gaynelle Arabian;  Surgeon: Bernestine Amass, MD;  Location: [facility];  Service: Urology;  Laterality: N/A;  sub urethral sling  possible ower  . Video bronchoscopy Bilateral 03/26/2013    Procedure: VIDEO BRONCHOSCOPY WITHOUT FLUORO;  Surgeon: Tanda Rockers, MD;  Location: [facility];  Service: Cardiopulmonary;  Laterality: Bilateral;  . Video bronchoscopy N/A 04/23/2013    Procedure: VIDEO BRONCHOSCOPY;  Surgeon: Melrose Nakayama, MD;  Location: [place];  Service: Thoracic;  Laterality: N/A;  . Rigid bronchoscopy N/A 04/23/2013    Procedure: LASER BRONCHOSCOPY;  Surgeon: Melrose Nakayama, MD;  Location: [place];  Service: Thoracic;  Laterality: N/A;  . Video bronchoscopy N/A 05/28/2014    Procedure: VIDEO BRONCHOSCOPY;   Surgeon: Melrose Nakayama, MD;  Location: [place];  Service: Thoracic;  Laterality: N/A;   Allergies  Allergen Reactions  . Codeine Itching  . Tramadol     headache  . Vivactil [Protriptyline Hcl] Palpitations   Prior to Admission medications   Medication Sig Start Date End Date Taking? Authorizing Provider  ALPRAZolam Duanne Moron) 0.5 MG tablet Take 1 tablet (0.5 mg total) by mouth at bedtime as needed for sleep. 12/20/14  Yes Golden Circle, FNP  cetirizine (ZYRTEC) 10 MG tablet Take 10 mg by mouth daily.   Yes Historical Provider, MD  ibuprofen (ADVIL,MOTRIN) 100 MG chewable tablet Chew 400 mg by mouth every 8 (eight) hours as needed.   Yes Historical Provider, MD  pantoprazole (PROTONIX) 40 MG tablet Take 1 tablet by mouth  daily 30 to 60 minutes  before first meal of the  day 01/14/15  Yes Golden Circle, FNP  simvastatin (ZOCOR) 40 MG tablet Take 1 tablet by mouth  daily 01/14/15  Yes Golden Circle, FNP   Social History   Social History  . Marital Status: Married    Spouse Name: N/A  . Number of Children: 1  . Years of Education: 12   Occupational History  . Retired    Social History Main Topics  . Smoking status: Never Smoker   . Smokeless tobacco: Never Used  . Alcohol Use: No  . Drug Use: No  . Sexual Activity:    Partners: Male    Birth Control/ Protection: Surgical   Other Topics Concern  . 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  HENT: Positive for facial swelling, rhinorrhea and sore throat. Negative for congestion and trouble swallowing.   Respiratory: Negative for cough, shortness of breath and wheezing.   Skin: Negative for rash.       Objective:   Physical Exam  Constitutional: She is oriented to person, place, and time. She appears  well-developed and well-nourished. No distress.  HENT:  Head: Normocephalic and atraumatic.  Mouth/Throat: Oropharynx is clear and moist. No oropharyngeal exudate.  Prominence of the tongue with indentation/scalloped appearence of the distal tongue and lateral borders. Tongue diffusely enlarged. Visualized oropharynx past the tongue with moist oral mucosa, no exudate or significant posterior oropharyngeal erythema. Slightly globus, but midline uvula. No lip swelling. . No rash inside the mouth. Some difficulty obtaining post oropharynx strep swab due to size of tongue.  Eyes: Pupils are equal, round, and reactive to light.  Neck: Neck supple. No JVD present. No tracheal deviation present. No thyromegaly present.  Cardiovascular: Normal rate, regular rhythm and normal heart sounds.   Pulmonary/Chest: Effort normal  and breath sounds normal. No stridor. No respiratory distress. She has no wheezes.  Clear to auscultation bilaterally.   Musculoskeletal: She exhibits no edema.  Lymphadenopathy:    She has no cervical adenopathy.  Neurological: She is alert and oriented to person, place, and time. No cranial nerve deficit.  Skin: Skin is warm and dry. No rash noted.  Skin clear, no hives.  Psychiatric: She has a normal mood and affect. Her behavior is normal.  Nursing note and vitals reviewed.   Filed Vitals:   04/17/15 1552  BP: 118/78  Pulse: 102  Temp: 98.2 F (36.8 C)  TempSrc: Oral  Resp: 18  Height: '5\' 3"'$  (1.6 m)  Weight: 198 lb 8 oz (90.039 kg)  SpO2: 98%    Results for orders placed or performed in visit on 04/17/15  POCT rapid strep A  Result Value Ref Range   Rapid Strep A Screen Negative Negative        Assessment & Plan:   Christina Hayes is a 65 y.o. female Angioedema, initial encounter - Plan: ranitidine (ZANTAC) tablet 150 mg  Enlarged tongue  Sore throat - Plan: POCT rapid strep A  History of enlarged tongue when evaluated by her dentist 2 weeks ago. Suspected  macroglossia based on initial symptoms. However, today with subjective worsening of tongue enlargement, with sore throat and slightly prominent uvula on exam. No urticaria or other rash to suggest allergic reaction, no lip involvement, no stridor, no wheeze. Airway clear, clearing secretions, and was able to swallow Zantac '150mg'$  tablet without difficulty. With acute subjective worsening today, differential includes angioedema.   -We'll have evaluated emergency room tonight, and can decide if further monitoring needed or able to be discharged home with RTC/ER precautions. Discussed nature of angioedema if this is the case with bradycardia and versus histamine cause. No identifiable cause based on her medications today, but may need allergist workup outpatient.   -[facility]P advised at [facility] ER.   -For macroglossia, will likely need TSH, B12, and other outpatient workup.  DDX also includes  amyloidosis. This can be worked up outpatient here or with her primary care provider.  Meds ordered this encounter  Medications  . ranitidine (ZANTAC) tablet 150 mg    Sig:    Patient Instructions       IF you received an x-ray today, you will receive an invoice from [facility]. Please contact [facility] at [phone] with questions or concerns regarding your invoice.   IF you received labwork today, you will receive an invoice from [employer]. Please contact [facility] at [phone] with questions or concerns regarding your invoice.   Our billing staff will not be able to assist you with questions regarding bills from these companies.  You will be contacted with the lab results as soon as they are available. The fastest way to get your results is to activate your My Chart account. Instructions are located on the last page of this paperwork. If you have not heard from [place] regarding the results in 2 weeks, please contact this office.     With your dentist  noting the swelling in your tongue 2 weeks ago, tongue swelling less likely angioedema, more likely macroglossia  (or enlarged tongue). This can be evaluated as an outpatient typically.   However with the acute worsening of swelling feeling today, angioedema is also possible. Angioedema as you can see below can come from many causes.  Sometimes this is associated with a medication,  but sometimes can be idiopathic or no known cause. You can be evaluated tonight by the emergency room to determine if they feel you are safe to watch and monitor your symptoms at home, and to determine if other medicines needed besides your usual zyrtec and the zantac I gave you here.  If you are released from the emergency room tonight, recommend follow-up with your primary care provider or here in the next few days for other evaluation of the enlarged tongue.  Angioedema Angioedema is a sudden swelling of tissues, often of the skin. It can occur on the face or genitals or in the abdomen or other body parts. The swelling usually develops over a short period and gets better in 24 to 48 hours. It often begins during the night and is found when the person wakes up. The person may also get red, itchy patches of skin (hives). Angioedema can be dangerous if it involves swelling of the air passages.  Depending on the cause, episodes of angioedema may only happen once, come back in unpredictable patterns, or repeat for several years and then gradually fade away.  CAUSES  Angioedema can be caused by an allergic reaction to various triggers. It can also result from nonallergic causes, including reactions to drugs, immune system disorders, viral infections, or an abnormal gene that is passed to you from your parents (hereditary). For some people with angioedema, the cause is unknown.  Some things that can trigger angioedema include:   Foods.   Medicines, such as ACE inhibitors, ARBs, nonsteroidal anti-inflammatory agents, or  estrogen.   Latex.   Animal saliva.   Insect stings.   Dyes used in X-rays.   Mild injury.   Dental work.  Surgery.  Stress.   Sudden changes in temperature.   Exercise. SIGNS AND SYMPTOMS   Swelling of the skin.  Hives. If these are present, there is also intense itching.  Redness in the affected area.   Pain in the affected area.  Swollen lips or tongue.  Breathing problems. This may happen if the air passages swell.  Wheezing. If internal organs are involved, there may be:   Nausea.   Abdominal pain.   Vomiting.   Difficulty swallowing.   Difficulty passing urine. DIAGNOSIS   Your health care provider will examine the affected area and take a medical and family history.  Various tests may be done to help determine the cause. Tests may include:  Allergy skin tests to see if the problem is an allergic reaction.   Blood tests to check for hereditary angioedema.   Tests to check for underlying diseases that could cause the condition.   A review of your medicines, including over-the-counter medicines, may be done. TREATMENT  Treatment will depend on the cause of the angioedema. Possible treatments include:   Removal of anything that triggered the condition (such as stopping certain medicines).   Medicines to treat symptoms or prevent attacks. Medicines given may include:   Antihistamines.   Epinephrine injection.   Steroids.   Hospitalization may be required for severe attacks. If the air passages are affected, it can be an emergency. Tubes may need to be placed to keep the airway open. HOME CARE INSTRUCTIONS   Take all medicines as directed by your health care provider.  If you were given medicines for emergency allergy treatment, always carry them with you.  Wear a medical bracelet as directed by your health care provider.   Avoid known triggers. SEEK MEDICAL CARE IF:  You have repeat attacks of angioedema.    Your attacks are more frequent or more severe despite preventive measures.   You have hereditary angioedema and are considering having children. It is important to discuss with your health care provider the risks of passing the condition on to your children. SEEK IMMEDIATE MEDICAL CARE IF:   You have severe swelling of the mouth, tongue, or lips.  You have difficulty breathing.   You have difficulty swallowing.   You faint. MAKE SURE YOU:  Understand these instructions.  Will watch your condition.  Will get help right away if you are not doing well or get worse.   This information is not intended to replace advice given to you by your health care provider. Make sure you discuss any questions you have with your health care provider.   Document Released: 03/19/2001 Document Revised: 01/29/2014 Document Reviewed: 09/01/2012 Elsevier Interactive Patient Education [employer].     I personally performed the services described in this documentation, which was scribed in my presence. The recorded information has been reviewed and considered, and addended by me as needed.

## 2015-04-19 ENCOUNTER — Ambulatory Visit (INDEPENDENT_AMBULATORY_CARE_PROVIDER_SITE_OTHER): Payer: Medicare Other | Admitting: Family Medicine

## 2015-04-19 VITALS — BP 122/79 | HR 80 | Temp 98.4°F | Resp 16 | Ht 63.0 in | Wt 198.0 lb

## 2015-04-19 DIAGNOSIS — R220 Localized swelling, mass and lump, head: Secondary | ICD-10-CM | POA: Diagnosis not present

## 2015-04-19 DIAGNOSIS — E785 Hyperlipidemia, unspecified: Secondary | ICD-10-CM

## 2015-04-19 DIAGNOSIS — R946 Abnormal results of thyroid function studies: Secondary | ICD-10-CM | POA: Diagnosis not present

## 2015-04-19 DIAGNOSIS — K219 Gastro-esophageal reflux disease without esophagitis: Secondary | ICD-10-CM

## 2015-04-19 DIAGNOSIS — G4700 Insomnia, unspecified: Secondary | ICD-10-CM

## 2015-04-19 DIAGNOSIS — J3489 Other specified disorders of nose and nasal sinuses: Secondary | ICD-10-CM | POA: Diagnosis not present

## 2015-04-19 MED ORDER — CETIRIZINE HCL 10 MG PO TABS: 10.0000 mg | ORAL_TABLET | Freq: Every day | ORAL | Status: DC

## 2015-04-19 MED ORDER — PANTOPRAZOLE SODIUM 40 MG PO TBEC: DELAYED_RELEASE_TABLET | ORAL | Status: DC

## 2015-04-19 MED ORDER — SIMVASTATIN 40 MG PO TABS: ORAL_TABLET | ORAL | Status: DC

## 2015-04-19 MED ORDER — ALPRAZOLAM 0.5 MG PO TABS: 0.5000 mg | ORAL_TABLET | Freq: Every evening | ORAL | Status: DC | PRN

## 2015-04-19 NOTE — Patient Instructions (Addendum)
I am running several lab tests to rule out either side effect of simvastatin or worsening thyroid function.

## 2015-04-19 NOTE — Addendum Note (Signed)
Addended by: Suszanne Finch on: 04/19/2015 01:30 PM   Modules accepted: Orders

## 2015-04-19 NOTE — Addendum Note (Signed)
Addended by: Burnis Kingfisher on: 04/19/2015 01:28 PM   Modules accepted: Orders

## 2015-04-19 NOTE — Addendum Note (Signed)
Addended by: Suszanne Finch on: 04/19/2015 01:29 PM   Modules accepted: Orders, Medications

## 2015-04-19 NOTE — Progress Notes (Signed)
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  She was seen by dentist two weeks ago who agreed with patient but did not offer any explanation.  Patient has chronic rhinorrhea  On Sunday, she felt her tongue was more swollen than usual.  She was evaluated here and sent to the ED who gave her prednisone but did not think there was any emergency or underlying infection.  No fevers or headache.  Objective:  NAD BP 122/79 mmHg  Pulse 80  Temp(Src) 98.4 F (36.9 C) (Oral)  Resp 16  Ht '5\' 3"'$  (1.6 m)  Wt 198 lb (89.812 kg)  BMI 35.08 kg/m2  SpO2 95% Oroph:  Completely normal with exception of some plaque on lower incisors.  This chart was scribed in my presence and reviewed by me personally.  Patient had elevated TSH one year ago    ICD-9-CM ICD-10-CM   1. Gingival swelling 784.2 R22.0 Lipid panel     COMPLETE METABOLIC PANEL WITH GFR     TSH  2. Abnormal thyroid function test 794.5 R94.6 TSH  3. Insomnia 780.52 G47.00 ALPRAZolam (XANAX) 0.5 MG tablet  4. Gastroesophageal reflux disease without esophagitis 530.81 K21.9 pantoprazole (PROTONIX) 40 MG tablet  5. Hyperlipidemia 272.4 E78.5 simvastatin (ZOCOR) 40 MG tablet     Lipid panel  6. Rhinorrhea 478.19 J34.89 cetirizine (ZYRTEC) 10 MG tablet     Signed, Robyn Haber, MD

## 2015-04-20 LAB — COMPLETE METABOLIC PANEL WITH GFR
ALT: 29 U/L (ref 6–29)
AST: 24 U/L (ref 10–35)
Albumin: 4.4 g/dL (ref 3.6–5.1)
Alkaline Phosphatase: 123 U/L (ref 33–130)
BUN: 18 mg/dL (ref 7–25)
CO2: 26 mmol/L (ref 20–31)
Calcium: 9.6 mg/dL (ref 8.6–10.4)
Chloride: 104 mmol/L (ref 98–110)
Creat: 1.01 mg/dL — ABNORMAL HIGH (ref 0.50–0.99)
GFR, Est African American: 67 mL/min (ref >=60)
GFR, Est Non African American: 58 mL/min — ABNORMAL LOW (ref >=60)
Glucose, Bld: 118 mg/dL — ABNORMAL HIGH (ref 65–99)
Potassium: 4.1 mmol/L (ref 3.5–5.3)
Sodium: 141 mmol/L (ref 135–146)
Total Bilirubin: 0.3 mg/dL (ref 0.2–1.2)
Total Protein: 7.5 g/dL (ref 6.1–8.1)

## 2015-04-20 LAB — LIPID PANEL
Cholesterol: 258 mg/dL — ABNORMAL HIGH (ref 125–200)
HDL: 100 mg/dL (ref >=46)
LDL Cholesterol: 141 mg/dL — ABNORMAL HIGH (ref <130)
Total CHOL/HDL Ratio: 2.6 Ratio (ref <=5.0)
Triglycerides: 87 mg/dL (ref <150)
VLDL: 17 mg/dL (ref <30)

## 2015-04-20 LAB — TSH: TSH: 1.03 mIU/L

## 2015-04-20 LAB — CULTURE, GROUP A STREP: ORGANISM ID, BACTERIA: NORMAL

## 2015-05-04 ENCOUNTER — Encounter: Payer: Self-pay | Admitting: Family Medicine

## 2015-05-04 DIAGNOSIS — R220 Localized swelling, mass and lump, head: Secondary | ICD-10-CM

## 2015-05-12 ENCOUNTER — Other Ambulatory Visit: Payer: Self-pay | Admitting: *Deleted

## 2015-05-12 DIAGNOSIS — R918 Other nonspecific abnormal finding of lung field: Secondary | ICD-10-CM

## 2015-05-26 ENCOUNTER — Telehealth: Payer: Self-pay | Admitting: Family Medicine

## 2015-05-26 NOTE — Telephone Encounter (Signed)
lmom to let pt know that i was putting her AVS in the mail to her from 04-17-15 with Dr Carlota Raspberry

## 2015-06-07 ENCOUNTER — Ambulatory Visit: Payer: Medicare Other | Admitting: Thoracic Surgery (Cardiothoracic Vascular Surgery)

## 2015-06-07 ENCOUNTER — Ambulatory Visit
Admission: RE | Admit: 2015-06-07 | Discharge: 2015-06-07 | Disposition: A | Discharge status: Home / Self Care | Payer: Medicare Other | Source: Ambulatory Visit | Attending: Thoracic Surgery (Cardiothoracic Vascular Surgery) | Admitting: Thoracic Surgery (Cardiothoracic Vascular Surgery)

## 2015-06-07 DIAGNOSIS — R918 Other nonspecific abnormal finding of lung field: Secondary | ICD-10-CM

## 2015-06-10 ENCOUNTER — Ambulatory Visit (INDEPENDENT_AMBULATORY_CARE_PROVIDER_SITE_OTHER): Payer: Medicare Other | Admitting: Family Medicine

## 2015-06-10 VITALS — BP 126/78 | HR 78 | Temp 98.2°F | Resp 20 | Ht 62.25 in | Wt 195.8 lb

## 2015-06-10 DIAGNOSIS — K1370 Unspecified lesions of oral mucosa: Secondary | ICD-10-CM

## 2015-06-10 DIAGNOSIS — M25562 Pain in left knee: Secondary | ICD-10-CM

## 2015-06-10 NOTE — Progress Notes (Signed)
Christina Hayes is a 65 y.o. female who presents to Urgent Care today for oral lesion and knee pain.  1) or lesion: Patient has a painless white oral lesion on the left buccal mucosa present for 3 months without significant change. Her dentist recommended it be evaluated by a physician. No fevers chills nausea vomiting diarrhea weight loss or night sweats. She does not use tobacco.  2) left knee pain: Patient notes moderate to severe left knee pain ongoing now for months without injury. She notes pain with knee extension and flexion. She notes some swelling at the posterior aspect of the knee. She's tried some over-the-counter medicines haven't helped. She has not had this formally evaluated yet.   Past Medical History  Diagnosis Date  . HYPERLIPIDEMIA, MIXED 12/03/2006    PT STATES NOT TAKING MED AS PRESCRIBED  . VERTIGO, HX OF 12/03/2006  . MIGRAINES, HX OF 12/03/2006  . Allergic rhinitis, cause unspecified   . SUI (stress urinary incontinence, female)   . OSA on CPAP   . Head cold   . Cough   . Frequency of urination   . Urgency of urination   . Nocturia   . Acid reflux   . Complication of anesthesia SLOW TO WAKE  . PONV (postoperative nausea and vomiting)     in past  . Headache(784.0)     migraqine- uses essential oils  . Shortness of breath   . H/O hiatal hernia     small  . DJD (degenerative joint disease)     spine  . Allergy    Past Surgical History  Procedure Laterality Date  . Salpingoophorectomy  1995    BILATERAL  . Vaginal hysterectomy  1988  . Pubovaginal sling  04/30/2011    Procedure: Gaynelle Arabian;  Surgeon: Bernestine Amass, MD;  Location: [facility];  Service: Urology;  Laterality: N/A;  sub urethral sling    possible ower  . Video bronchoscopy Bilateral 03/26/2013    Procedure: VIDEO BRONCHOSCOPY WITHOUT FLUORO;  Surgeon: Tanda Rockers, MD;  Location: [facility];  Service: Cardiopulmonary;  Laterality: Bilateral;  . Video  bronchoscopy N/A 04/23/2013    Procedure: VIDEO BRONCHOSCOPY;  Surgeon: Melrose Nakayama, MD;  Location: [place];  Service: Thoracic;  Laterality: N/A;  . Rigid bronchoscopy N/A 04/23/2013    Procedure: LASER BRONCHOSCOPY;  Surgeon: Melrose Nakayama, MD;  Location: [place];  Service: Thoracic;  Laterality: N/A;  . Video bronchoscopy N/A 05/28/2014    Procedure: VIDEO BRONCHOSCOPY;  Surgeon: Melrose Nakayama, MD;  Location: [place];  Service: Thoracic;  Laterality: N/A;   Social History  Substance Use Topics  . Smoking status: Never Smoker   . Smokeless tobacco: Never Used  . Alcohol Use: No   ROS as above Medications: Current Outpatient Prescriptions  Medication Sig Dispense Refill  . cetirizine (ZYRTEC) 10 MG tablet Take 1 tablet (10 mg total) by mouth daily. 90 tablet 3  . diphenhydrAMINE (SOMINEX) 25 MG tablet Take 25 mg by mouth at bedtime as needed for sleep.    . pantoprazole (PROTONIX) 40 MG tablet Take 1 tablet by mouth  daily 30 to 60 minutes  before first meal of the  day 90 tablet 3  . simvastatin (ZOCOR) 40 MG tablet Take 1 tablet by mouth  daily 90 tablet 3  . ALPRAZolam (XANAX) 0.5 MG tablet Take 1 tablet (0.5 mg total) by mouth at bedtime as needed for sleep. (Patient not taking: Reported on 06/10/2015)  90 tablet 1   No current facility-administered medications for this visit.   Allergies  Allergen Reactions  . Codeine Itching    *Can take Hydrocodone*  . Tramadol     headache  . Vivactil [Protriptyline Hcl] Palpitations     Exam:  BP 126/78 mmHg  Pulse 78  Temp(Src) 98.2 F (36.8 C) (Oral)  Resp 20  Ht 5' 2.25" (1.581 m)  Wt 195 lb 12.8 oz (88.814 kg)  BMI 35.53 kg/m2  SpO2 96% Gen: Well NAD Nontoxic appearing HEENT: EOMI,  MMM White plaque left buccal mucosa present. Not removable with tongue depressor. No other oral lesions are visible. Lungs: Normal work of breathing. CTABL Heart: RRR no MRG Abd: NABS, Soft. Nondistended, Nontender Exts: Brisk  capillary refill, warm and well perfused.  Left knee: Moderate effusion with small Baker's cyst present. Range of motion 5-120 with 1+ retropatellar crepitations. Nontender throughout. Normal range of motion. Stable ligamentous exam. Positive medial and lateral McMurray's test.  No results found for this or any previous visit (from the past 24 hour(s)). No results found.  Assessment and Plan: 65 y.o. female with  1) left oral lesion: Concern for leukoplakia. [facility]erred to [facility] nose [facility].  2) left knee pain: Likely DJD. Patient elects for follow-up at sports medicine for further evaluation of this problem with x-ray and likely ultrasound-guided steroid knee injection.  Discussed warning signs or symptoms. Please see discharge instructions. Patient expresses understanding.

## 2015-06-10 NOTE — Patient Instructions (Addendum)
Thank you for coming in today. Please follow up with Dr Georgina Snell at [facility].  Address: [address] [place], [place] [address]                         Phone: [phone]  Return as needed.   Follow up with [facility].   IF you received an x-ray today, you will receive an invoice from [facility]. Please contact [facility] at [phone] with questions or concerns regarding your invoice.   IF you received labwork today, you will receive an invoice from [employer]. Please contact [facility] at [phone] with questions or concerns regarding your invoice.   Our billing staff will not be able to assist you with questions regarding bills from these companies.  You will be contacted with the lab results as soon as they are available. The fastest way to get your results is to activate your My Chart account. Instructions are located on the last page of this paperwork. If you have not heard from [place] regarding the results in 2 weeks, please contact this office.

## 2015-06-14 ENCOUNTER — Encounter: Payer: Self-pay | Admitting: Thoracic Surgery (Cardiothoracic Vascular Surgery)

## 2015-06-14 ENCOUNTER — Ambulatory Visit (INDEPENDENT_AMBULATORY_CARE_PROVIDER_SITE_OTHER): Payer: Medicare Other | Admitting: Thoracic Surgery (Cardiothoracic Vascular Surgery)

## 2015-06-14 VITALS — BP 126/66 | HR 76 | Resp 20 | Ht 62.25 in | Wt 195.0 lb

## 2015-06-14 DIAGNOSIS — D3A00 Benign carcinoid tumor of unspecified site: Secondary | ICD-10-CM

## 2015-06-14 NOTE — Progress Notes (Signed)
[address]       [place] [address]             [phone]       HPI: Christina Hayes returns for a one-year follow-up visit (2 years since surgery)  Christina Hayes is a 65 year old woman who originally presented with recurrent wheezing and respiratory infections. Ultimately, she was found to have an endobronchial mass in the left mainstem bronchus. I did an endobronchial resection and laser ablation in April 2015. It turned out to be a carcinoid tumor.  In May 2016 I did a surveillance bronchoscopy which showed no evidence of recurrence. A CT also showed no evidence of disease.  Since her last visit she has been feeling well. She denies any wheezing or respiratory infections. She has an occasional cough but it is rare. She denies hemoptysis. Her weight is stable. She feels well. She denies chest pain, pressure, or tightness.  Past Medical History  Diagnosis Date  . HYPERLIPIDEMIA, MIXED 12/03/2006    PT STATES NOT TAKING MED AS PRESCRIBED  . VERTIGO, HX OF 12/03/2006  . MIGRAINES, HX OF 12/03/2006  . Allergic rhinitis, cause unspecified   . SUI (stress urinary incontinence, female)   . OSA on CPAP   . Head cold   . Cough   . Frequency of urination   . Urgency of urination   . Nocturia   . Acid reflux   . Complication of anesthesia SLOW TO WAKE  . PONV (postoperative nausea and vomiting)     in past  . Headache(784.0)     migraqine- uses essential oils  . Shortness of breath   . H/O hiatal hernia     small  . DJD (degenerative joint disease)     spine  . Allergy       Current Outpatient Prescriptions  Medication Sig Dispense Refill  . ALPRAZolam (XANAX) 0.5 MG tablet Take 1 tablet (0.5 mg total) by mouth at bedtime as needed for sleep. 90 tablet 1  . cetirizine (ZYRTEC) 10 MG tablet Take 1 tablet (10 mg total) by mouth daily. 90 tablet 3  . pantoprazole (PROTONIX) 40 MG tablet Take 1 tablet by mouth  daily 30 to 60 minutes  before first meal of the   day 90 tablet 3  . simvastatin (ZOCOR) 40 MG tablet Take 1 tablet by mouth  daily 90 tablet 3   No current facility-administered medications for this visit.    Physical Exam BP 126/66 mmHg  Pulse 76  Resp 20  Ht 5' 2.25" (1.581 m)  Wt 195 lb (88.451 kg)  BMI 35.39 kg/m2  SpO70 60% 65 year old woman in no acute distress Alert and oriented 3 no focal deficits No cervical or subclavicular adenopathy Cardiac regular rate and rhythm normal S1 and S2 Lungs clear with equal breath sounds bilaterally  Diagnostic Tests: CT CHEST WITHOUT CONTRAST  TECHNIQUE: Multidetector CT imaging of the chest was performed following the standard protocol without IV contrast.  COMPARISON: 05/18/2014, 11/10/2013  FINDINGS: Mediastinum/Nodes: No lymphadenopathy. Normal heart size. No pericardial effusion. Mild coronary artery atherosclerosis in the circumflex.  Lungs/Pleura: Lungs are clear. No focal consolidation, pleural effusion or pneumothorax. No endobronchial mass identified.  Upper abdomen: Small hiatal hernia. Focal fatty infiltration again noted along the central aspect of the liver adjacent to the porta hepatis.  Musculoskeletal: No acute osseous abnormality. No lytic or sclerotic osseous lesion.  IMPRESSION: 1. No active cardiopulmonary disease. No recurrent or residual pulmonary  neoplasm.   Electronically Signed  By: Kathreen Devoid  On: 06/07/2015 15:14  I personally reviewed the CT chest and concur with findings as noted above.  Impression: 65 year old woman who is now 2 years removed from an endobronchial resection of a carcinoid tumor from the left mainstem bronchus. She has no evidence of recurrent disease.  Given the location of this lesion I do think she needs continued follow-up. I will plan to scan her again in 1 year.  Plan: Return in one year with CT chest.  Melrose Nakayama, MD [facility] [phone]

## 2015-06-15 ENCOUNTER — Ambulatory Visit (INDEPENDENT_AMBULATORY_CARE_PROVIDER_SITE_OTHER): Payer: Medicare Other

## 2015-06-15 ENCOUNTER — Encounter: Payer: Self-pay | Admitting: Family Medicine

## 2015-06-15 ENCOUNTER — Ambulatory Visit (INDEPENDENT_AMBULATORY_CARE_PROVIDER_SITE_OTHER): Payer: Medicare Other | Admitting: Family Medicine

## 2015-06-15 VITALS — BP 145/85 | HR 75 | Wt 197.0 lb

## 2015-06-15 DIAGNOSIS — M25462 Effusion, left knee: Secondary | ICD-10-CM

## 2015-06-15 DIAGNOSIS — M4806 Spinal stenosis, lumbar region: Secondary | ICD-10-CM | POA: Diagnosis not present

## 2015-06-15 DIAGNOSIS — M545 Low back pain, unspecified: Secondary | ICD-10-CM

## 2015-06-15 DIAGNOSIS — M170 Bilateral primary osteoarthritis of knee: Secondary | ICD-10-CM | POA: Diagnosis not present

## 2015-06-15 DIAGNOSIS — M25561 Pain in right knee: Secondary | ICD-10-CM

## 2015-06-15 DIAGNOSIS — M25562 Pain in left knee: Secondary | ICD-10-CM

## 2015-06-15 DIAGNOSIS — M5136 Other intervertebral disc degeneration, lumbar region: Secondary | ICD-10-CM | POA: Diagnosis not present

## 2015-06-15 NOTE — Progress Notes (Signed)
Christina Hayes is a 65 y.o. female who presents to [facility] today for left knee pain and right low back pain.  1) left knee pain: Patient notes moderate to severe left knee pain ongoing now for months without injury. She notes pain with knee extension and flexion. She notes some swelling at the posterior aspect of the knee. She's tried some over-the-counter medicines haven't helped.   2) right low back pain. Present off and on for months. She cannot recall any injury. Patient is present in the right low back into the buttocks. No radiation down the leg weakness or numbness or loss of function. No bowel bladder dysfunction. She's tried some over-the-counter medicines which have helped intermittently.  Past Medical History  Diagnosis Date  . HYPERLIPIDEMIA, MIXED 12/03/2006    PT STATES NOT TAKING MED AS PRESCRIBED  . VERTIGO, HX OF 12/03/2006  . MIGRAINES, HX OF 12/03/2006  . Allergic rhinitis, cause unspecified   . SUI (stress urinary incontinence, female)   . OSA on CPAP   . Head cold   . Cough   . Frequency of urination   . Urgency of urination   . Nocturia   . Acid reflux   . Complication of anesthesia SLOW TO WAKE  . PONV (postoperative nausea and vomiting)     in past  . Headache(784.0)     migraqine- uses essential oils  . Shortness of breath   . H/O hiatal hernia     small  . DJD (degenerative joint disease)     spine  . Allergy    Past Surgical History  Procedure Laterality Date  . Salpingoophorectomy  1995    BILATERAL  . Vaginal hysterectomy  1988  . Pubovaginal sling  04/30/2011    Procedure: Gaynelle Arabian;  Surgeon: Bernestine Amass, MD;  Location[facility];  Service: Urology;  Laterality: N/A;  sub urethral sling    possible ower  . Video bronchoscopy Bilateral 03/26/2013    Procedure: VIDEO BRONCHOSCOPY WITHOUT FLUORO;  Surgeon: Tanda Rockers, MD;  Location: [facility];  Service:  Cardiopulmonary;  Laterality: Bilateral;  . Video bronchoscopy N/A 04/23/2013    Procedure: VIDEO BRONCHOSCOPY;  Surgeon: Melrose Nakayama, MD;  Location: [facility];  Service: Thoracic;  Laterality: N/A;  . Rigid bronchoscopy N/A 04/23/2013    Procedure: LASER BRONCHOSCOPY;  Surgeon: Melrose Nakayama, MD;  Location: [facility];  Service: Thoracic;  Laterality: N/A;  . Video bronchoscopy N/A 05/28/2014    Procedure: VIDEO BRONCHOSCOPY;  Surgeon: Melrose Nakayama, MD;  Location: [facility];  Service: Thoracic;  Laterality: N/A;   Social History  Substance Use Topics  . Smoking status: Never Smoker   . Smokeless tobacco: Never Used  . Alcohol Use: No   family history includes Allergies in her daughter; Alzheimer's disease in her father; Cancer in her maternal grandmother and mother; Cancer (age of onset: 32) in her daughter; Diabetes in her father and mother; Hyperlipidemia in her father.  ROS:  No headache, visual changes, nausea, vomiting, diarrhea, constipation, dizziness, abdominal pain, skin rash, fevers, chills, night sweats, weight loss, swollen lymph nodes, body aches, joint swelling, muscle aches, chest pain, shortness of breath, mood changes, visual or auditory hallucinations.    Medications: Current Outpatient Prescriptions  Medication Sig Dispense Refill  . ALPRAZolam (XANAX) 0.5 MG tablet Take 1 tablet (0.5 mg total) by mouth at bedtime as needed for sleep. 90 tablet 1  . cetirizine (ZYRTEC)  10 MG tablet Take 1 tablet (10 mg total) by mouth daily. 90 tablet 3  . pantoprazole (PROTONIX) 40 MG tablet Take 1 tablet by mouth  daily 30 to 60 minutes  before first meal of the  day 90 tablet 3  . simvastatin (ZOCOR) 40 MG tablet Take 1 tablet by mouth  daily 90 tablet 3   No current facility-administered medications for this visit.   Allergies  Allergen Reactions  . Codeine Itching    *Can take Hydrocodone*  . Tramadol     headache  . Vivactil [Protriptyline Hcl] Palpitations      Exam:  BP 145/85 mmHg  Pulse 75  Wt 197 lb (89.359 kg)  General: Well Developed, well nourished, and in no acute distress.  Neuro/Psych: Alert and oriented x3, extra-ocular muscles intact, able to move all 4 extremities, sensation grossly intact. Skin: Warm and dry, no rashes noted.  Respiratory: Not using accessory muscles, speaking in full sentences, trachea midline.  Cardiovascular: Pulses palpable, no extremity edema. Abdomen: Does not appear distended. MSK: Moderate effusion with small Baker's cyst present. Range of motion 5-120 with 1+ retropatellar crepitations. Nontender throughout. Normal range of motion. Stable ligamentous exam. Positive medial and lateral McMurray's test.  Back: Nontender to midline. Mildly tender to palpation right lumbar paraspinal. Normal back motion. Looks really strength is equal and normal throughout. Normal gait.  X-ray L-spine shows diffuse mild to moderate DDD. Knees x-ray bilaterally show medial compartment DJD with mild bilateral patellofemoral DJD.  Awaiting formal radiology review.  Procedure: Real-time Ultrasound Guided Injection of left knee  Device: GE Logiq E  Images permanently stored and available for review in the ultrasound unit. Verbal informed consent obtained. Discussed risks and benefits of procedure. Warned about infection bleeding damage to structures skin hypopigmentation and fat atrophy among others. Patient expresses understanding and agreement Time-out conducted.  Noted no overlying erythema, induration, or other signs of local infection.  Skin prepped in a sterile fashion.  Local anesthesia: Topical Ethyl chloride.  With sterile technique and under real time ultrasound guidance: 80 mg of triamcinolone and 4 mL of Marcaine injected easily.  Completed without difficulty  Pain immediately resolved suggesting accurate placement of the medication.  Advised to call if fevers/chills, erythema, induration,  drainage, or persistent bleeding.  Images permanently stored and available for review in the ultrasound unit.  Impression: Technically successful ultrasound guided injection.    No results found for this or any previous visit (from the past 24 hour(s)). Dg Lumbar Spine Complete  06/15/2015  CLINICAL DATA:  Low back and left hip pain for the past 5 days with no history of injury EXAM: LUMBAR SPINE - COMPLETE 4+ VIEW COMPARISON:  Lumbar spine series of January 26, 2014 FINDINGS: The lumbar vertebral bodies are preserved in height. There is disc space narrowing at L3-4 which is slightly more conspicuous today. There is no spondylolisthesis. There is no significant facet joint hypertrophy. No pars defects are observed. The pedicles and transverse processes are intact. IMPRESSION: Mild degenerative disc space narrowing at L3-4 which is slightly more prominent than on the previous study. There is no compression fracture nor other acute bony abnormality. Electronically Signed   By: David  Martinique M.D.   On: 06/15/2015 14:11   Dg Knee 1-2 Views Right  06/15/2015  EXAM: RIGHT KNEE - 1-2 VIEW; LEFT KNEE - COMPLETE 4+ VIEW COMPARISON:  None in PACs FINDINGS: Left knee: The bones of the left knee are adequately mineralized. There is mild beaking of  the tibial spines. The medial and lateral joint spaces are preserved. The patellofemoral joint spaces reasonably well-maintained. There are tiny spurs arising from the inferior and lateral articular margins of the left knee. There is a small suprapatellar effusion. There is no acute fracture. AP views of the right knee reveal the bones to be adequately mineralized. The medial and lateral joint spaces are well maintained. There are tiny tibial spine osteophytes. IMPRESSION: Mild degenerative changes of both knees greater on the left than on the right. There is no acute bony abnormality. There is a small suprapatellar effusion on the left. Electronically Signed   By: David   Martinique M.D.   On: 06/15/2015 14:08   Dg Knee Complete 4 Views Left  06/15/2015  EXAM: RIGHT KNEE - 1-2 VIEW; LEFT KNEE - COMPLETE 4+ VIEW COMPARISON:  None in PACs FINDINGS: Left knee: The bones of the left knee are adequately mineralized. There is mild beaking of the tibial spines. The medial and lateral joint spaces are preserved. The patellofemoral joint spaces reasonably well-maintained. There are tiny spurs arising from the inferior and lateral articular margins of the left knee. There is a small suprapatellar effusion. There is no acute fracture. AP views of the right knee reveal the bones to be adequately mineralized. The medial and lateral joint spaces are well maintained. There are tiny tibial spine osteophytes. IMPRESSION: Mild degenerative changes of both knees greater on the left than on the right. There is no acute bony abnormality. There is a small suprapatellar effusion on the left. Electronically Signed   By: David  Martinique M.D.   On: 06/15/2015 14:08      65 yo woman with  Left knee pain likely DJD. Treat with steroid injection and watchful waiting. Return in about 4 weeks.  Low back pain: Doing reasonably well right now. Consider physical therapy referral as needed.

## 2015-06-15 NOTE — Patient Instructions (Signed)
Thank you for coming in today. Call or go to the ER if you develop a large red swollen joint with extreme pain or oozing puss.   Return in 4 weeks.

## 2015-06-16 NOTE — Progress Notes (Signed)
Quick Note:  Mild arthritis in the back and knees bilaterally. ______

## 2015-07-04 DIAGNOSIS — G4733 Obstructive sleep apnea (adult) (pediatric): Secondary | ICD-10-CM | POA: Diagnosis not present

## 2015-07-04 DIAGNOSIS — J302 Other seasonal allergic rhinitis: Secondary | ICD-10-CM | POA: Diagnosis not present

## 2015-07-04 DIAGNOSIS — K121 Other forms of stomatitis: Secondary | ICD-10-CM | POA: Diagnosis not present

## 2015-07-13 ENCOUNTER — Ambulatory Visit (INDEPENDENT_AMBULATORY_CARE_PROVIDER_SITE_OTHER): Payer: Medicare Other | Admitting: Family Medicine

## 2015-07-13 ENCOUNTER — Encounter: Payer: Self-pay | Admitting: Family Medicine

## 2015-07-13 VITALS — BP 128/71 | HR 62 | Ht 62.0 in | Wt 197.0 lb

## 2015-07-13 DIAGNOSIS — Z780 Asymptomatic menopausal state: Secondary | ICD-10-CM

## 2015-07-13 DIAGNOSIS — M25562 Pain in left knee: Secondary | ICD-10-CM

## 2015-07-13 DIAGNOSIS — G4720 Circadian rhythm sleep disorder, unspecified type: Secondary | ICD-10-CM

## 2015-07-13 DIAGNOSIS — Z23 Encounter for immunization: Secondary | ICD-10-CM | POA: Diagnosis not present

## 2015-07-13 DIAGNOSIS — G479 Sleep disorder, unspecified: Secondary | ICD-10-CM

## 2015-07-13 DIAGNOSIS — E782 Mixed hyperlipidemia: Secondary | ICD-10-CM | POA: Diagnosis not present

## 2015-07-13 DIAGNOSIS — K529 Noninfective gastroenteritis and colitis, unspecified: Secondary | ICD-10-CM

## 2015-07-13 DIAGNOSIS — Z1159 Encounter for screening for other viral diseases: Secondary | ICD-10-CM | POA: Diagnosis not present

## 2015-07-13 LAB — LIPID PANEL
Cholesterol: 222 mg/dL — ABNORMAL HIGH (ref 125–200)
HDL: 109 mg/dL (ref >=46)
LDL CALC: 96 mg/dL (ref <130)
TRIGLYCERIDES: 83 mg/dL (ref <150)
Total CHOL/HDL Ratio: 2 Ratio (ref <=5.0)
VLDL: 17 mg/dL (ref <30)

## 2015-07-13 MED ORDER — ZOLPIDEM TARTRATE 5 MG PO TABS: 5.0000 mg | ORAL_TABLET | Freq: Every evening | ORAL | Status: DC | PRN

## 2015-07-13 MED ORDER — PNEUMOCOCCAL VAC POLYVALENT 25 MCG/0.5ML IJ INJ: 0.5000 mL | INJECTION | Freq: Once | INTRAMUSCULAR | Status: DC

## 2015-07-13 NOTE — Progress Notes (Addendum)
Christina Hayes is a 65 y.o. female who presents to [facility] [place] today for establish care. Patient has been seen previously in my sports medicine practice for knee pain. She would like to establish chimeric care here.  She has several pertinent issues to discuss today  Knee pain: Patient notes left knee pain. She was doing very well following her steroid injection last month. She twisted her knee and notes some medial knee pain. She says the pain is improving overall. She's used ice and rest which has helped a lot.  Insomnia: Patient notes continued insomnia. She has used Ambien in the past which helped a lot. She currently has a prescription for Xanax but feels is not very helpful.  Diarrhea: Patient notes chronic intermittent diarrhea. She denies any pain or blood in the stool fevers or chills. She notes she had a negative colon cancer screening test recently. She uses Imodium intermittently which helps a lot.  Hyperlipidemia: On simvastatin last check in March showed LDL of 140. No muscle pain  Health maintenance: Prevnar 13 due Varicella up-to-date Tdap up-to-date Colon cancer screening up-to-date Mammograms up-to-date Bone density test due Fall and depression screening today Hep C test due    Past Medical History  Diagnosis Date  . HYPERLIPIDEMIA, MIXED 12/03/2006    PT STATES NOT TAKING MED AS PRESCRIBED  . VERTIGO, HX OF 12/03/2006  . MIGRAINES, HX OF 12/03/2006  . Allergic rhinitis, cause unspecified   . SUI (stress urinary incontinence, female)   . OSA on CPAP   . Head cold   . Cough   . Frequency of urination   . Urgency of urination   . Nocturia   . Acid reflux   . Complication of anesthesia SLOW TO WAKE  . PONV (postoperative nausea and vomiting)     in past  . Headache(784.0)     migraqine- uses essential oils  . Shortness of breath   .  H/O hiatal hernia     small  . DJD (degenerative joint disease)     spine  . Allergy    Past Surgical History  Procedure Laterality Date  . Salpingoophorectomy  1995    BILATERAL  . Vaginal hysterectomy  1988  . Pubovaginal sling  04/30/2011    Procedure: Gaynelle Arabian;  Surgeon: Bernestine Amass, MD;  Location: [facility];  Service: Urology;  Laterality: N/A;  sub urethral sling    possible ower  . Video bronchoscopy Bilateral 03/26/2013    Procedure: VIDEO BRONCHOSCOPY WITHOUT FLUORO;  Surgeon: Tanda Rockers, MD;  Location: [facility];  Service: Cardiopulmonary;  Laterality: Bilateral;  . Video bronchoscopy N/A 04/23/2013    Procedure: VIDEO BRONCHOSCOPY;  Surgeon: Melrose Nakayama, MD;  Location: [facility];  Service: Thoracic;  Laterality: N/A;  . Rigid bronchoscopy N/A 04/23/2013    Procedure: LASER BRONCHOSCOPY;  Surgeon: Melrose Nakayama, MD;  Location: [facility];  Service: Thoracic;  Laterality: N/A;  . Video bronchoscopy N/A 05/28/2014    Procedure: VIDEO BRONCHOSCOPY;  Surgeon: Melrose Nakayama, MD;  Location: [facility];  Service: Thoracic;  Laterality: N/A;   Social History  Substance Use Topics  . Smoking status: Never Smoker   . Smokeless tobacco: Never Used  . Alcohol Use: No   family history includes Allergies in her daughter; Alzheimer's disease in her father; Cancer in her maternal grandmother and mother; Cancer (age of onset: 69) in her daughter; Diabetes  in her father and mother; Hyperlipidemia in her father.  ROS as above:  Medications: Current Outpatient Prescriptions  Medication Sig Dispense Refill  . cetirizine (ZYRTEC) 10 MG tablet Take 1 tablet (10 mg total) by mouth daily. 90 tablet 3  . pantoprazole (PROTONIX) 40 MG tablet Take 1 tablet by mouth  daily 30 to 60 minutes  before first meal of the  day 90 tablet 3  . simvastatin (ZOCOR) 40 MG tablet Take 1 tablet by mouth  daily 90 tablet 3  . zolpidem (AMBIEN) 5 MG tablet Take 1 tablet (5  mg total) by mouth at bedtime as needed for sleep. 30 tablet 1   No current facility-administered medications for this visit.   Allergies  Allergen Reactions  . Codeine Itching    *Can take Hydrocodone*  . Tramadol     headache  . Protriptyline Palpitations  . Vivactil [Protriptyline Hcl] Palpitations     Exam:  BP 128/71 mmHg  Pulse 62  Ht '5\' 2"'$  (1.575 m)  Wt 197 lb (89.359 kg)  BMI 36.02 kg/m2  SpO2 98% Gen: Well NAD HEENT: EOMI,  MMM Lungs: Normal work of breathing. CTABL Heart: RRR no MRG Abd: NABS, Soft. Nondistended, Nontender Exts: Brisk capillary refill, warm and well perfused.  MSK: Left knee normal-appearing no swelling or effusion Normal motion 1+ patellar crepitations on extension Nontender Stable ligamentous exam   Depression screen [facility] 2/9 07/13/2015 06/10/2015 04/19/2015 04/17/2015  Decreased Interest 0 0 0 0  Down, Depressed, Hopeless 0 0 0 0  PHQ - 2 Score 0 0 0 0      No results found for this or any previous visit (from the past 24 hour(s)). No results found.    Assessment and Plan: 65 y.o. female with  1) left knee pain: Possible exacerbation of existing DJD. Symptoms seem to be improving. Plan for watchful waiting 2) insomnia: Ambien prescribed. Recheck in a few months. 3) diarrhea: Likely IBS D type. Watchful waiting and treat with Imodium as needed. 4) hyperlipidemia: Recheck lipids. 5) health maintenance:  Prevnar 13 given Bone density test ordered Hep C ordered  Discussed warning signs or symptoms. Please see discharge instructions. Patient expresses understanding.

## 2015-07-13 NOTE — Patient Instructions (Signed)
Thank you for coming in today. Get fasting labs.  Get bone density.  Get records of sleep study and of colon cancer screening.  Return in 3-6 months.

## 2015-07-13 NOTE — Addendum Note (Signed)
Addended by: Teddy Spike on: 07/13/2015 12:03 PM   Modules accepted: Orders

## 2015-07-13 NOTE — Addendum Note (Signed)
Addended by: Gregor Hams on: 07/13/2015 12:29 PM   Modules accepted: Miquel Dunn

## 2015-07-14 LAB — HEPATITIS C ANTIBODY: HCV Ab: NEGATIVE

## 2015-07-14 NOTE — Progress Notes (Signed)
Quick Note:  Cholesterol looks pretty good! ______

## 2015-07-20 ENCOUNTER — Encounter: Payer: Self-pay | Admitting: Family Medicine

## 2015-07-20 ENCOUNTER — Ambulatory Visit (INDEPENDENT_AMBULATORY_CARE_PROVIDER_SITE_OTHER): Payer: Medicare Other

## 2015-07-20 DIAGNOSIS — M8580 Other specified disorders of bone density and structure, unspecified site: Secondary | ICD-10-CM | POA: Insufficient documentation

## 2015-07-20 DIAGNOSIS — M85852 Other specified disorders of bone density and structure, left thigh: Secondary | ICD-10-CM

## 2015-07-20 DIAGNOSIS — Z780 Asymptomatic menopausal state: Secondary | ICD-10-CM

## 2015-07-20 DIAGNOSIS — M8588 Other specified disorders of bone density and structure, other site: Secondary | ICD-10-CM

## 2015-07-20 NOTE — Progress Notes (Signed)
Quick Note:  Osteopenia noted. Make sure to take calcium and vit d regularly. Continue some weight bearing exercise. ______

## 2015-07-21 ENCOUNTER — Encounter: Payer: Self-pay | Admitting: Family Medicine

## 2015-07-21 DIAGNOSIS — G4730 Sleep apnea, unspecified: Secondary | ICD-10-CM | POA: Insufficient documentation

## 2015-08-22 ENCOUNTER — Encounter: Payer: Self-pay | Admitting: Family Medicine

## 2015-08-24 ENCOUNTER — Ambulatory Visit (INDEPENDENT_AMBULATORY_CARE_PROVIDER_SITE_OTHER): Payer: Medicare Other | Admitting: Family Medicine

## 2015-08-24 ENCOUNTER — Telehealth: Payer: Self-pay | Admitting: Family Medicine

## 2015-08-24 VITALS — BP 130/85 | HR 72 | Wt 197.0 lb

## 2015-08-24 DIAGNOSIS — M25562 Pain in left knee: Secondary | ICD-10-CM

## 2015-08-24 NOTE — Progress Notes (Signed)
Christina Hayes is a 65 y.o. female who presents to [place]: [place] today for left knee pain. Patient returns to clinic to follow-up left knee pain. She had a ultrasound guided steroid injection in May that lasted a few weeks. She notes continued pain limiting activity. She denies any locking or catching. No new injury radiating pain weakness or numbness fevers or chills. Symptoms are moderate and interfere with normal activities.   Past Medical History:  Diagnosis Date  . Acid reflux   . Allergic rhinitis, cause unspecified   . Allergy   . Complication of anesthesia SLOW TO WAKE  . Cough   . DJD (degenerative joint disease)    spine  . Frequency of urination   . H/O hiatal hernia    small  . Head cold   . Headache(784.0)    migraqine- uses essential oils  . HYPERLIPIDEMIA, MIXED 12/03/2006   PT STATES NOT TAKING MED AS PRESCRIBED  . MIGRAINES, HX OF 12/03/2006  . Nocturia   . OSA on CPAP   . PONV (postoperative nausea and vomiting)    in past  . Shortness of breath   . SUI (stress urinary incontinence, female)   . Urgency of urination   . VERTIGO, HX OF 12/03/2006   Past Surgical History:  Procedure Laterality Date  . PUBOVAGINAL SLING  04/30/2011   Procedure: Gaynelle Arabian;  Surgeon: Bernestine Amass, MD;  Location: [facility];  Service: Urology;  Laterality: N/A;  sub urethral sling    possible ower  . RIGID BRONCHOSCOPY N/A 04/23/2013   Procedure: LASER BRONCHOSCOPY;  Surgeon: Melrose Nakayama, MD;  Location: [place];  Service: Thoracic;  Laterality: N/A;  . [place]  . VIDEO BRONCHOSCOPY Bilateral 03/26/2013   Procedure: VIDEO BRONCHOSCOPY WITHOUT FLUORO;  Surgeon: Tanda Rockers, MD;  Location: [facility];  Service: Cardiopulmonary;  Laterality: Bilateral;  . VIDEO  BRONCHOSCOPY N/A 04/23/2013   Procedure: VIDEO BRONCHOSCOPY;  Surgeon: Melrose Nakayama, MD;  Location: [place];  Service: Thoracic;  Laterality: N/A;  . VIDEO BRONCHOSCOPY N/A 05/28/2014   Procedure: VIDEO BRONCHOSCOPY;  Surgeon: Melrose Nakayama, MD;  Location: [place];  Service: Thoracic;  Laterality: N/A;   Social History  Substance Use Topics  . Smoking status: Never Smoker  . Smokeless tobacco: Never Used  . Alcohol use No   family history includes Allergies in her daughter; Alzheimer's disease in her father; Cancer in her maternal grandmother and mother; Cancer (age of onset: 69) in her daughter; Diabetes in her father and mother; Hyperlipidemia in her father.  ROS as above:  Medications: Current Outpatient Prescriptions  Medication Sig Dispense Refill  . cetirizine (ZYRTEC) 10 MG tablet Take 1 tablet (10 mg total) by mouth daily. 90 tablet 3  . pantoprazole (PROTONIX) 40 MG tablet Take 1 tablet by mouth  daily 30 to 60 minutes  before first meal of the  day 90 tablet 3  . simvastatin (ZOCOR) 40 MG tablet Take 1 tablet by mouth  daily 90 tablet 3  . zolpidem (AMBIEN) 5 MG tablet Take 1 tablet (5 mg total) by mouth at bedtime as needed for sleep. 30 tablet 1   No current facility-administered medications for this visit.    Allergies  Allergen Reactions  . Codeine Itching    *Can take Hydrocodone*  . Tramadol     headache  .  Protriptyline Palpitations  . Vivactil [Protriptyline Hcl] Palpitations     Exam:  BP 130/85   Pulse 72   Wt 197 lb (89.4 kg)   BMI 36.03 kg/m  Gen: Well NAD Left knee: Mild effusion. Normal motion. Normal tender. Positive medial McMurray's test. Negative anterior drawer test. Stable valgus and varus stress.  X-ray left knee EXAM: RIGHT KNEE - 1-2 VIEW; LEFT KNEE - COMPLETE 4+ VIEW  COMPARISON:  None in PACs  FINDINGS: Left knee: The bones of the left knee are adequately mineralized. There is mild beaking of the tibial spines. The  medial and lateral joint spaces are preserved. The patellofemoral joint spaces reasonably well-maintained. There are tiny spurs arising from the inferior and lateral articular margins of the left knee. There is a small suprapatellar effusion. There is no acute fracture.  AP views of the right knee reveal the bones to be adequately mineralized. The medial and lateral joint spaces are well maintained. There are tiny tibial spine osteophytes.  IMPRESSION: Mild degenerative changes of both knees greater on the left than on the right. There is no acute bony abnormality. There is a small suprapatellar effusion on the left.   Electronically Signed   By: David  Martinique M.D.   On: 06/15/2015 14:08  No results found for this or any previous visit (from the past 24 hour(s)). No results found.    Assessment and Plan: 65 y.o. female with left knee pain. Only mild DJD noted. Concern for meniscus injury. Plan for MRI. If not meniscus injury noted on MRI would recommend trial of Hyaluronic acid injections   Orders Placed This Encounter  Procedures  . MR Knee Left  Wo Contrast    Standing Status:   Future    Standing Expiration Date:   10/23/2016    Order Specific Question:   Reason for Exam (SYMPTOM  OR DIAGNOSIS REQUIRED)    Answer:   eval knee pain    Order Specific Question:   Preferred imaging location?    Answer:   Product/process development scientist (table limit-350lbs)    Order Specific Question:   What is the patient's sedation requirement?    Answer:   No Sedation    Order Specific Question:   Does the patient have a pacemaker or implanted devices?    Answer:   No    Discussed warning signs or symptoms. Please see discharge instructions. Patient expresses understanding.

## 2015-08-24 NOTE — Telephone Encounter (Signed)
Submitted for approval on [employer]. Awaiting confirmation.  

## 2015-08-24 NOTE — Telephone Encounter (Signed)
-----   Message from Gregor Hams, MD sent at 08/24/2015  1:34 PM EDT ----- Please start prior authorization for Orthovisc

## 2015-08-24 NOTE — Patient Instructions (Signed)
Thank you for coming in today. Return after knee MRI.  We will figure out gel shots

## 2015-08-25 NOTE — Telephone Encounter (Signed)
Received the following information from OV benefits investigation:  Patient has [employer] Medicare complete HMO plan with an effective date of 02/22/2014. [id number] is covered at 80% & [id number] is covered at 80% of the contracted rate when performed in an office setting. If out of pocket is met, coverage goes to 100% & copay will no longer apply. $50.00 copay for specialist office visit applies, if billed. Ref# [id number]   Called Pt, advised of information and estimated OOP cost. Pt states she cannot afford that at this time and will just get the steroid injections as needed. Will let PCP know.

## 2015-09-05 ENCOUNTER — Ambulatory Visit (INDEPENDENT_AMBULATORY_CARE_PROVIDER_SITE_OTHER): Payer: Medicare Other

## 2015-09-05 DIAGNOSIS — M25562 Pain in left knee: Secondary | ICD-10-CM

## 2015-09-05 DIAGNOSIS — S83242A Other tear of medial meniscus, current injury, left knee, initial encounter: Secondary | ICD-10-CM | POA: Diagnosis not present

## 2015-09-05 DIAGNOSIS — M948X6 Other specified disorders of cartilage, lower leg: Secondary | ICD-10-CM

## 2015-09-05 DIAGNOSIS — X58XXXA Exposure to other specified factors, initial encounter: Secondary | ICD-10-CM

## 2015-09-06 ENCOUNTER — Ambulatory Visit (INDEPENDENT_AMBULATORY_CARE_PROVIDER_SITE_OTHER): Payer: Medicare Other | Admitting: Family Medicine

## 2015-09-06 ENCOUNTER — Encounter: Payer: Self-pay | Admitting: Family Medicine

## 2015-09-06 DIAGNOSIS — M23206 Derangement of unspecified meniscus due to old tear or injury, right knee: Secondary | ICD-10-CM

## 2015-09-06 DIAGNOSIS — M1711 Unilateral primary osteoarthritis, right knee: Secondary | ICD-10-CM

## 2015-09-06 DIAGNOSIS — M23200 Derangement of unspecified lateral meniscus due to old tear or injury, right knee: Secondary | ICD-10-CM | POA: Diagnosis not present

## 2015-09-06 DIAGNOSIS — M23205 Derangement of unspecified medial meniscus due to old tear or injury, unspecified knee: Secondary | ICD-10-CM | POA: Insufficient documentation

## 2015-09-06 DIAGNOSIS — M23203 Derangement of unspecified medial meniscus due to old tear or injury, right knee: Secondary | ICD-10-CM

## 2015-09-06 DIAGNOSIS — M179 Osteoarthritis of knee, unspecified: Secondary | ICD-10-CM | POA: Insufficient documentation

## 2015-09-06 DIAGNOSIS — M1710 Unilateral primary osteoarthritis, unspecified knee: Secondary | ICD-10-CM | POA: Insufficient documentation

## 2015-09-06 NOTE — Patient Instructions (Signed)
Thank you for coming in today. We will keep an eye on your knee.  Return as needed for more treatment.

## 2015-09-06 NOTE — Progress Notes (Signed)
Christina Hayes is a 65 y.o. female who presents to [place]: [place] today for follow-up left knee pain. Patient has had intermittent moderate to severe left knee pain for some time now. The pain has been worsening over the last several months. She's had a trial of steroid injections which helped temporarily. As she failed to improve she had a knee MRI yesterday that showed full thickness cartilage loss of the patella, moderate chondromalacia of the medial femoral condyle and tibial plateau and degenerative medial meniscus tear as well as a chronic partial anterior cruciate ligament tear.  She notes currently she does not have much pain and is feeling pretty well with a hinged knee brace. She denies significant locking or catching.    Past Medical History:  Diagnosis Date  . Acid reflux   . Allergic rhinitis, cause unspecified   . Allergy   . Complication of anesthesia SLOW TO WAKE  . Cough   . DJD (degenerative joint disease)    spine  . Frequency of urination   . H/O hiatal hernia    small  . Head cold   . Headache(784.0)    migraqine- uses essential oils  . HYPERLIPIDEMIA, MIXED 12/03/2006   PT STATES NOT TAKING MED AS PRESCRIBED  . MIGRAINES, HX OF 12/03/2006  . Nocturia   . OSA on CPAP   . PONV (postoperative nausea and vomiting)    in past  . Shortness of breath   . SUI (stress urinary incontinence, female)   . Urgency of urination   . VERTIGO, HX OF 12/03/2006   Past Surgical History:  Procedure Laterality Date  . PUBOVAGINAL SLING  04/30/2011   Procedure: Gaynelle Arabian;  Surgeon: Bernestine Amass, MD;  Location: [facility];  Service: Urology;  Laterality: N/A;  sub urethral sling    possible ower  . RIGID BRONCHOSCOPY N/A 04/23/2013   Procedure: LASER BRONCHOSCOPY;  Surgeon: Melrose Nakayama, MD;  Location: [place];  Service: Thoracic;   Laterality: N/A;  . [place]  . VIDEO BRONCHOSCOPY Bilateral 03/26/2013   Procedure: VIDEO BRONCHOSCOPY WITHOUT FLUORO;  Surgeon: Tanda Rockers, MD;  [facility];  Service: Cardiopulmonary;  Laterality: Bilateral;  . VIDEO BRONCHOSCOPY N/A 04/23/2013   Procedure: VIDEO BRONCHOSCOPY;  Surgeon: Melrose Nakayama, MD;  Location: [place];  Service: Thoracic;  Laterality: N/A;  . VIDEO BRONCHOSCOPY N/A 05/28/2014   Procedure: VIDEO BRONCHOSCOPY;  Surgeon: Melrose Nakayama, MD;  Location: [place];  Service: Thoracic;  Laterality: N/A;   Social History  Substance Use Topics  . Smoking status: Never Smoker  . Smokeless tobacco: Never Used  . Alcohol use No   family history includes Allergies in her daughter; Alzheimer's disease in her father; Cancer in her maternal grandmother and mother; Cancer (age of onset: 33) in her daughter; Diabetes in her father and mother; Hyperlipidemia in her father.  ROS as above:  Medications: Current Outpatient Prescriptions  Medication Sig Dispense Refill  . cetirizine (ZYRTEC) 10 MG tablet Take 1 tablet (10 mg total) by mouth daily. 90 tablet 3  . pantoprazole (PROTONIX) 40 MG tablet Take 1 tablet by mouth  daily 30 to 60 minutes  before first meal of the  day 90 tablet 3  . simvastatin (ZOCOR) 40 MG tablet Take 1 tablet by mouth  daily 90 tablet 3  . zolpidem (AMBIEN) 5  MG tablet Take 1 tablet (5 mg total) by mouth at bedtime as needed for sleep. 30 tablet 1   No current facility-administered medications for this visit.    Allergies  Allergen Reactions  . Codeine Itching    *Can take Hydrocodone*  . Tramadol     headache  . Protriptyline Palpitations  . Vivactil [Protriptyline Hcl] Palpitations     Exam:  BP 119/67 (BP Location: Right Arm, Patient Position: Sitting, Cuff Size: Large)   Pulse 95   Temp 98.2 F (36.8 C) (Oral)   Wt 197 lb 1.3 oz (89.4 kg)   SpO2 96%   BMI 36.05  kg/m  Gen: Well NAD Right knee normal-appearing without effusion. Nontender normal motion with retropatellar crepitations. Stable ligamentous exam.  No results found for this or any previous visit (from the past 24 hour(s)). Mr Knee Left  Wo Contrast  Result Date: 09/05/2015 CLINICAL DATA:  Left knee swelling.  Pain all over. EXAM: MRI OF THE LEFT KNEE WITHOUT CONTRAST TECHNIQUE: Multiplanar, multisequence MR imaging of the knee was performed. No intravenous contrast was administered. COMPARISON:  None. FINDINGS: MENISCI Medial meniscus: Radial tear of the posterior horn of the medial meniscus adjacent to the meniscal root with peripheral meniscal extrusion. Lateral meniscus: Small radial tear of the free edge of the body of the medial meniscus. LIGAMENTS Cruciates: Intact PCL. Wavy appearance of the ACL with a few intact fibers concerning for a partial tear at the ACL insertion, likely chronic. Collaterals: Medial collateral ligament is intact. Lateral collateral ligament complex is intact. CARTILAGE Patellofemoral: Full-thickness cartilage loss of the medial patellar facet with subchondral reactive marrow changes. Cartilage fissure of the patellar apex. Partial-thickness cartilage loss of the lateral femorotibial compartment. Medial: Partial-thickness cartilage loss of the medial femoral condyle and medial tibial plateau. Lateral:  No chondral defect. Joint: No joint effusion. Edema in superolateral Hoffa's fat. No plical thickening. Popliteal Fossa:  No Baker cyst.  Intact popliteus tendon. Extensor Mechanism:  Intact. Bones:  No marrow signal abnormality.  No fracture dislocation. Other: None IMPRESSION: 1. Radial tear of the posterior horn of the medial meniscus adjacent to the meniscal root with peripheral meniscal extrusion. 2. Small radial tear of the free edge of the body of the medial meniscus. 3. Wavy appearance of the ACL with a few intact fibers concerning for a partial tear at the ACL  insertion, likely chronic. 4. Cartilage abnormalities of the patellofemoral compartment and medial femorotibial compartment. Electronically Signed   By: Kathreen Devoid   On: 09/05/2015 13:37      Assessment and Plan: 65 y.o. female with knee pain with a variety of potential causal agents. I believe the majority of her pain is due to patellofemoral chondromalacia and bone edema. She additionally has some medial compartment chondromalacia as well. Plan for watchful waiting, quad strengthening, intermittent steroid injections. Would consider Hyaluronic acid injections if not much better.  No orders of the defined types were placed in this encounter.   Discussed warning signs or symptoms. Please see discharge instructions. Patient expresses understanding.

## 2015-09-06 NOTE — Progress Notes (Signed)
Pt here to follow up on MRI completed yesterday.

## 2015-09-28 ENCOUNTER — Encounter: Payer: Self-pay | Admitting: Family Medicine

## 2015-09-28 ENCOUNTER — Ambulatory Visit (INDEPENDENT_AMBULATORY_CARE_PROVIDER_SITE_OTHER): Payer: Medicare Other | Admitting: Family Medicine

## 2015-09-28 VITALS — BP 131/57 | HR 65 | Temp 98.2°F | Resp 18 | Wt 198.0 lb

## 2015-09-28 DIAGNOSIS — M1712 Unilateral primary osteoarthritis, left knee: Secondary | ICD-10-CM

## 2015-09-28 DIAGNOSIS — M25562 Pain in left knee: Secondary | ICD-10-CM

## 2015-09-28 NOTE — Progress Notes (Signed)
Christina Hayes is a 65 y.o. female who presents to [place]: [place] today for Follow up left knee pain.   Patient has had ongoing left knee pain for some time now. She recently had MRI of her left knee which showed severe patellofemoral chondromalacia. She had a steroid injection in May 2017 that work for a few weeks. She has investigated Hyaluronic acid and finds it to be too expensive at about $800 for course of treatment.  She notes the knee pain has returned. She plans to go on vacation to [place] in a few weeks where she will be doing a lot of walking and is interested in a repeat knee injection today if possible. She notes pain and some swelling worse with activity better with rest. She has tried some over-the-counter medicines which help a little.   Past Medical History:  Diagnosis Date  . Acid reflux   . Allergic rhinitis, cause unspecified   . Allergy   . Complication of anesthesia SLOW TO WAKE  . Cough   . DJD (degenerative joint disease)    spine  . Frequency of urination   . H/O hiatal hernia    small  . Head cold   . Headache(784.0)    migraqine- uses essential oils  . HYPERLIPIDEMIA, MIXED 12/03/2006   PT STATES NOT TAKING MED AS PRESCRIBED  . MIGRAINES, HX OF 12/03/2006  . Nocturia   . OSA on CPAP   . PONV (postoperative nausea and vomiting)    in past  . Shortness of breath   . SUI (stress urinary incontinence, female)   . Urgency of urination   . VERTIGO, HX OF 12/03/2006   Past Surgical History:  Procedure Laterality Date  . PUBOVAGINAL SLING  04/30/2011   Procedure: Gaynelle Arabian;  Surgeon: Bernestine Amass, MD;  [facility];  Service: Urology;  Laterality: N/A;  sub urethral sling    possible ower  . RIGID BRONCHOSCOPY N/A 04/23/2013   Procedure: LASER BRONCHOSCOPY;  Surgeon: Melrose Nakayama, MD;  Location:  [place];  Service: Thoracic;  Laterality: N/A;  . [place]  . VIDEO BRONCHOSCOPY Bilateral 03/26/2013   Procedure: VIDEO BRONCHOSCOPY WITHOUT FLUORO;  Surgeon: Tanda Rockers, MD;  Location: [facility];  Service: Cardiopulmonary;  Laterality: Bilateral;  . VIDEO BRONCHOSCOPY N/A 04/23/2013   Procedure: VIDEO BRONCHOSCOPY;  Surgeon: Melrose Nakayama, MD;  Location: [place];  Service: Thoracic;  Laterality: N/A;  . VIDEO BRONCHOSCOPY N/A 05/28/2014   Procedure: VIDEO BRONCHOSCOPY;  Surgeon: Melrose Nakayama, MD;  Location: [place];  Service: Thoracic;  Laterality: N/A;   Social History  Substance Use Topics  . Smoking status: Never Smoker  . Smokeless tobacco: Never Used  . Alcohol use No   family history includes Allergies in her daughter; Alzheimer's disease in her father; Cancer in her maternal grandmother and mother; Cancer (age of onset: 51) in her daughter; Diabetes in her father and mother; Hyperlipidemia in her father.  ROS as above:  Medications: Current Outpatient Prescriptions  Medication Sig Dispense Refill  . cetirizine (ZYRTEC) 10 MG tablet Take 1 tablet (10 mg total) by mouth daily. 90 tablet 3  . pantoprazole (PROTONIX) 40 MG tablet Take 1 tablet by mouth  daily 30 to 60 minutes  before first meal of the  day 90 tablet 3  . simvastatin (ZOCOR) 40 MG  tablet Take 1 tablet by mouth  daily 90 tablet 3  . zolpidem (AMBIEN) 5 MG tablet Take 1 tablet (5 mg total) by mouth at bedtime as needed for sleep. 30 tablet 1   No current facility-administered medications for this visit.    Allergies  Allergen Reactions  . Codeine Itching    *Can take Hydrocodone*  . Tramadol     headache  . Protriptyline Palpitations  . Vivactil [Protriptyline Hcl] Palpitations     Exam:  BP (!) 131/57 (BP Location: Right Arm, Patient Position: Sitting, Cuff Size: Large)   Pulse 65   Temp 98.2 F (36.8 C) (Oral)   Resp 18   Wt 198  lb 0.6 oz (89.8 kg)   SpO2 97%   BMI 36.22 kg/m  Gen: Well NAD Left knee: Normal-appearing mild effusion. Normal motion with patellar crepitations. Stable ligaments exam. Normal gait.  Procedure: Real-time Ultrasound Guided Injection of left knee  Device: GE Logiq E  Images permanently stored and available for review in the ultrasound unit. Verbal informed consent obtained. Discussed risks and benefits of procedure. Warned about infection bleeding damage to structures skin hypopigmentation and fat atrophy among others. Patient expresses understanding and agreement Time-out conducted.  Noted no overlying erythema, induration, or other signs of local infection.  Skin prepped in a sterile fashion.  Local anesthesia: Topical Ethyl chloride.  With sterile technique and under real time ultrasound guidance: 80 mg of Kenalog and 4 mL of Marcaine injected easily.  Completed without difficulty  Pain immediately resolved suggesting accurate placement of the medication.  Advised to call if fevers/chills, erythema, induration, drainage, or persistent bleeding.  Images permanently stored and available for review in the ultrasound unit.  Impression: Technically successful ultrasound guided injection.  Lot number: Kenalog: [id number] Marcaine: [id number]  No results found for this or any previous visit (from the past 24 hour(s)). No results found.    Assessment and Plan: 65 y.o. female with left knee pain due to patellofemoral chondromalacia. Repeat injection today. Return as needed. Recommend weight loss and quad strengthening.   No orders of the defined types were placed in this encounter.   Discussed warning signs or symptoms. Please see discharge instructions. Patient expresses understanding.

## 2015-09-28 NOTE — Patient Instructions (Signed)
Thank you for coming in today. Return in October.  Call or go to the ER if you develop a large red swollen joint with extreme pain or oozing puss.

## 2015-09-28 NOTE — Progress Notes (Signed)
Pt here for follow up on left knee pain

## 2015-11-10 ENCOUNTER — Ambulatory Visit (INDEPENDENT_AMBULATORY_CARE_PROVIDER_SITE_OTHER): Payer: Medicare Other | Admitting: Family Medicine

## 2015-11-10 VITALS — BP 143/84 | HR 54 | Wt 195.0 lb

## 2015-11-10 DIAGNOSIS — M25562 Pain in left knee: Secondary | ICD-10-CM | POA: Diagnosis not present

## 2015-11-10 DIAGNOSIS — Z23 Encounter for immunization: Secondary | ICD-10-CM

## 2015-11-10 DIAGNOSIS — Z1211 Encounter for screening for malignant neoplasm of colon: Secondary | ICD-10-CM

## 2015-11-10 DIAGNOSIS — G8929 Other chronic pain: Secondary | ICD-10-CM

## 2015-11-10 DIAGNOSIS — E782 Mixed hyperlipidemia: Secondary | ICD-10-CM | POA: Diagnosis not present

## 2015-11-10 NOTE — Progress Notes (Signed)
Christina Hayes is a 65 y.o. female who presents to [place]: [place] today for  Follow-up knee pain, hyperlipidemia, insomnia.   Knee pain: Much improved since the last visit where she received a steroid injection. She continues her home exercise program and feels well.   HLD: Currently taking statin medication. She feels well with no cramping or leg pain.  Insomnia: Patient takes Ambien intermittently for insomnia. Very rarely she'll use a Xanax.    Colon cancer screening: Overdue. Patient is interested in colonoscopy.   Past Medical History:  Diagnosis Date  . Acid reflux   . Allergic rhinitis, cause unspecified   . Allergy   . Complication of anesthesia SLOW TO WAKE  . Cough   . DJD (degenerative joint disease)    spine  . Frequency of urination   . H/O hiatal hernia    small  . Head cold   . Headache(784.0)    migraqine- uses essential oils  . HYPERLIPIDEMIA, MIXED 12/03/2006   PT STATES NOT TAKING MED AS PRESCRIBED  . MIGRAINES, HX OF 12/03/2006  . Nocturia   . OSA on CPAP   . PONV (postoperative nausea and vomiting)    in past  . Shortness of breath   . SUI (stress urinary incontinence, female)   . Urgency of urination   . VERTIGO, HX OF 12/03/2006   Past Surgical History:  Procedure Laterality Date  . PUBOVAGINAL SLING  04/30/2011   Procedure: Gaynelle Arabian;  Surgeon: Bernestine Amass, MD;  Location: [facility];  Service: Urology;  Laterality: N/A;  sub urethral sling    possible ower  . RIGID BRONCHOSCOPY N/A 04/23/2013   Procedure: LASER BRONCHOSCOPY;  Surgeon: Melrose Nakayama, MD;  Location: [place];  Service: Thoracic;  Laterality: N/A;  . [place]  . VIDEO BRONCHOSCOPY Bilateral 03/26/2013   Procedure: VIDEO BRONCHOSCOPY WITHOUT FLUORO;  Surgeon: Tanda Rockers,  MD;  Location: [facility];  Service: Cardiopulmonary;  Laterality: Bilateral;  . VIDEO BRONCHOSCOPY N/A 04/23/2013   Procedure: VIDEO BRONCHOSCOPY;  Surgeon: Melrose Nakayama, MD;  Location: [place];  Service: Thoracic;  Laterality: N/A;  . VIDEO BRONCHOSCOPY N/A 05/28/2014   Procedure: VIDEO BRONCHOSCOPY;  Surgeon: Melrose Nakayama, MD;  Location: [place];  Service: Thoracic;  Laterality: N/A;   Social History  Substance Use Topics  . Smoking status: Never Smoker  . Smokeless tobacco: Never Used  . Alcohol use No   family history includes Allergies in her daughter; Alzheimer's disease in her father; Cancer in her maternal grandmother and mother; Cancer (age of onset: 53) in her daughter; Diabetes in her father and mother; Hyperlipidemia in her father.  ROS as above:  Medications: Current Outpatient Prescriptions  Medication Sig Dispense Refill  . Calcium Carb-Cholecalciferol (SM CALCIUM/VITAMIN D) 600-800 MG-UNIT TABS Take by mouth.    . cetirizine (ZYRTEC) 10 MG tablet Take 1 tablet (10 mg total) by mouth daily. 90 tablet 3  . pantoprazole (PROTONIX) 40 MG tablet Take 1 tablet by mouth  daily 30 to 60 minutes  before first meal of the  day 90 tablet 3  . simvastatin (ZOCOR) 40 MG tablet Take 1 tablet by mouth  daily 90 tablet 3  . zolpidem (AMBIEN) 5 MG tablet Take 1 tablet (5 mg total) by mouth at bedtime as needed for sleep. 30 tablet 1  . ALPRAZolam (  XANAX) 0.5 MG tablet      No current facility-administered medications for this visit.    Allergies  Allergen Reactions  . Codeine Itching    *Can take Hydrocodone*  . Tramadol     headache  . Protriptyline Palpitations  . Vivactil [Protriptyline Hcl] Palpitations    Health Maintenance Health Maintenance  Topic Date Due  . COLONOSCOPY  02/27/1999  . INFLUENZA VACCINE  08/23/2015  . MAMMOGRAM  01/10/2017  . PNA vac Low Risk Adult (2 of 2 - PPSV23) 03/25/2017  . TETANUS/TDAP  01/27/2024  . DEXA SCAN  Completed  .  ZOSTAVAX  Completed  . Hepatitis C Screening  Completed     Exam:  BP (!) 143/84   Pulse (!) 54   Wt 195 lb (88.5 kg)   BMI 35.67 kg/m  Gen: Well NAD HEENT: EOMI,  MMM Lungs: Normal work of breathing. CTABL Heart: RRR no MRG Abd: NABS, Soft. Nondistended, Nontender Exts: Brisk capillary refill, warm and well perfused.   Lab Results  Component Value Date   CHOL 222 (H) 07/13/2015   HDL 109 07/13/2015   LDLCALC 96 07/13/2015   LDLDIRECT 231.2 03/28/2010   TRIG 83 07/13/2015   CHOLHDL 2.0 07/13/2015      No results found for this or any previous visit (from the past 72 hour(s)). No results found.    Assessment and Plan: 65 y.o. female with  Knee pain: Much improved continued watchful waiting.  Hyperlipidemia: Doing reasonably well. Continue simvastatin.  Insomnia: Discussed safety of Ambien and Xanax. Ultimately would like to wean off of these medications in the future.  Colon cancer screening: Refer to gastroenterology   Orders Placed This Encounter  Procedures  . Flu Vaccine QUAD 36+ mos IM  . Ambulatory referral to Gastroenterology    Referral Priority:   Routine    Referral Type:   Consultation    Referral Reason:   Specialty Services Required    Requested Specialty:   Gastroenterology    Number of Visits Requested:   1    Discussed warning signs or symptoms. Please see discharge instructions. Patient expresses understanding.

## 2015-11-10 NOTE — Patient Instructions (Signed)
Thank you for coming in today. Recheck in 3-6 months or sooner if needed.  Work on reducing carbs.  You should hear from colonscopy soon about an appointment.

## 2015-11-18 ENCOUNTER — Encounter: Payer: Self-pay | Admitting: Gastroenterology

## 2015-12-28 ENCOUNTER — Encounter: Payer: Self-pay | Admitting: Gastroenterology

## 2015-12-28 ENCOUNTER — Ambulatory Visit (AMBULATORY_SURGERY_CENTER): Payer: Self-pay | Admitting: *Deleted

## 2015-12-28 VITALS — Ht 62.0 in | Wt 197.0 lb

## 2015-12-28 DIAGNOSIS — Z1211 Encounter for screening for malignant neoplasm of colon: Secondary | ICD-10-CM

## 2015-12-28 MED ORDER — NA SULFATE-K SULFATE-MG SULF 17.5-3.13-1.6 GM/177ML PO SOLN
1.0000 | Freq: Once | ORAL | 0 refills | Status: AC
Start: 2015-12-28 — End: 2015-12-28

## 2015-12-28 NOTE — Progress Notes (Signed)
No egg or soy allergy known to patient  No issues with past sedation with any surgeries  or procedures, no intubation problems  No diet pills per patient No home 02 use per patient  No blood thinners per patient  Pt denies issues with constipation  No A fib or A flutter   

## 2015-12-29 ENCOUNTER — Telehealth: Payer: Self-pay | Admitting: Gastroenterology

## 2015-12-29 NOTE — Telephone Encounter (Signed)
Pt states prep 88$ with coupon and she cannot afford. Sent staff message to [facility] to obtain sample  Pt instructed to call us if she hasn't heard from leslie by 12-14 [facility]

## 2016-01-05 NOTE — Telephone Encounter (Signed)
Patient calling in regarding this.  °

## 2016-01-06 ENCOUNTER — Telehealth: Payer: Self-pay

## 2016-01-06 NOTE — Telephone Encounter (Signed)
Left message with patient's husband that I would leave patient's Suprep sample up front to be picked up.  He agreed.

## 2016-01-11 ENCOUNTER — Encounter: Payer: Medicare Other | Admitting: Gastroenterology

## 2016-02-21 ENCOUNTER — Telehealth: Payer: Self-pay | Admitting: Gastroenterology

## 2016-02-21 ENCOUNTER — Other Ambulatory Visit: Payer: Self-pay | Admitting: Family Medicine

## 2016-02-21 DIAGNOSIS — Z1231 Encounter for screening mammogram for malignant neoplasm of breast: Secondary | ICD-10-CM

## 2016-02-22 NOTE — Telephone Encounter (Signed)
Spoke with patient and informed her that we will contact her early next week to have her come pick up a free Suprep sample. Patient verbalized understanding.

## 2016-02-22 NOTE — Telephone Encounter (Signed)
I will call patient as soon as I get the new shipment, hopefully early next week

## 2016-02-28 ENCOUNTER — Other Ambulatory Visit: Payer: Self-pay | Admitting: Family Medicine

## 2016-02-28 DIAGNOSIS — E785 Hyperlipidemia, unspecified: Secondary | ICD-10-CM

## 2016-02-28 DIAGNOSIS — K219 Gastro-esophageal reflux disease without esophagitis: Secondary | ICD-10-CM

## 2016-02-28 NOTE — Telephone Encounter (Signed)
Left message on vm that I would leave Suprep sample up front to be picked up.

## 2016-03-01 ENCOUNTER — Encounter: Payer: Medicare Other | Admitting: Gastroenterology

## 2016-03-12 ENCOUNTER — Ambulatory Visit (INDEPENDENT_AMBULATORY_CARE_PROVIDER_SITE_OTHER): Payer: Medicare Other | Admitting: Family Medicine

## 2016-03-12 ENCOUNTER — Encounter: Payer: Self-pay | Admitting: Family Medicine

## 2016-03-12 DIAGNOSIS — M7989 Other specified soft tissue disorders: Secondary | ICD-10-CM | POA: Diagnosis not present

## 2016-03-12 DIAGNOSIS — Z1329 Encounter for screening for other suspected endocrine disorder: Secondary | ICD-10-CM | POA: Diagnosis not present

## 2016-03-12 NOTE — Patient Instructions (Signed)
Thank you for coming in today. You should hear about the ultrasound soon.  Get labs today.  We will likely send in a prescription for HCTZ a fluid/blood pressure pill.  We will recheck in 1 month.  This may change based on results.   Use compression stocking.    Edema Edema is an abnormal buildup of fluids in your bodytissues. Edema is somewhatdependent on gravity to pull the fluid to the lowest place in your body. That makes the condition more common in the legs and thighs (lower extremities). Painless swelling of the feet and ankles is common and becomes more likely as you get older. It is also common in looser tissues, like around your eyes. When the affected area is squeezed, the fluid may move out of that spot and leave a dent for a few moments. This dent is called pitting. What are the causes? There are many possible causes of edema. Eating too much salt and being on your feet or sitting for a long time can cause edema in your legs and ankles. Hot weather may make edema worse. Common medical causes of edema include:  Heart failure.  Liver disease.  Kidney disease.  Weak blood vessels in your legs.  Cancer.  An injury.  Pregnancy.  Some medications.  Obesity. What are the signs or symptoms? Edema is usually painless.Your skin may look swollen or shiny. How is this diagnosed? Your health care provider may be able to diagnose edema by asking about your medical history and doing a physical exam. You may need to have tests such as X-rays, an electrocardiogram, or blood tests to check for medical conditions that may cause edema. How is this treated? Edema treatment depends on the cause. If you have heart, liver, or kidney disease, you need the treatment appropriate for these conditions. General treatment may include:  Elevation of the affected body part above the level of your heart.  Compression of the affected body part. Pressure from elastic bandages or support  stockings squeezes the tissues and forces fluid back into the blood vessels. This keeps fluid from entering the tissues.  Restriction of fluid and salt intake.  Use of a water pill (diuretic). These medications are appropriate only for some types of edema. They pull fluid out of your body and make you urinate more often. This gets rid of fluid and reduces swelling, but diuretics can have side effects. Only use diuretics as directed by your health care provider. Follow these instructions at home:  Keep the affected body part above the level of your heart when you are lying down.  Do not sit still or stand for prolonged periods.  Do not put anything directly under your knees when lying down.  Do not wear constricting clothing or garters on your upper legs.  Exercise your legs to work the fluid back into your blood vessels. This may help the swelling go down.  Wear elastic bandages or support stockings to reduce ankle swelling as directed by your health care provider.  Eat a low-salt diet to reduce fluid if your health care provider recommends it.  Only take medicines as directed by your health care provider. Contact a health care provider if:  Your edema is not responding to treatment.  You have heart, liver, or kidney disease and notice symptoms of edema.  You have edema in your legs that does not improve after elevating them.  You have sudden and unexplained weight gain. Get help right away if:  You develop  shortness of breath or chest pain.  You cannot breathe when you lie down.  You develop pain, redness, or warmth in the swollen areas.  You have heart, liver, or kidney disease and suddenly get edema.  You have a fever and your symptoms suddenly get worse. This information is not intended to replace advice given to you by your health care provider. Make sure you discuss any questions you have with your health care provider. Document Released: 01/08/2005 Document Revised:  06/16/2015 Document Reviewed: 10/31/2012 Elsevier Interactive Patient Education  2017 [place].   Hydrochlorothiazide, HCTZ capsules or tablets What is this medicine? HYDROCHLOROTHIAZIDE (hye droe klor oh THYE a zide) is a diuretic. It increases the amount of urine passed, which causes the body to lose salt and water. This medicine is used to treat high blood pressure. It is also reduces the swelling and water retention caused by various medical conditions, such as heart, liver, or kidney disease. This medicine may be used for other purposes; ask your health care provider or pharmacist if you have questions. COMMON BRAND NAME(S): Esidrix, Ezide, HydroDIURIL, Microzide, Oretic, Zide What should I tell my health care provider before I take this medicine? They need to know if you have any of these conditions: -diabetes -gout -immune system problems, like lupus -kidney disease or kidney stones -liver disease -pancreatitis -small amount of urine or difficulty passing urine -an unusual or allergic reaction to hydrochlorothiazide, sulfa drugs, other medicines, foods, dyes, or preservatives -pregnant or trying to get pregnant -breast-feeding How should I use this medicine? Take this medicine by mouth with a glass of water. Follow the directions on the prescription label. Take your medicine at regular intervals. Remember that you will need to pass urine frequently after taking this medicine. Do not take your doses at a time of day that will cause you problems. Do not stop taking your medicine unless your doctor tells you to. Talk to your pediatrician regarding the use of this medicine in children. Special care may be needed. Overdosage: If you think you have taken too much of this medicine contact a poison control center or emergency room at once. NOTE: This medicine is only for you. Do not share this medicine with others. What if I miss a dose? If you miss a dose, take it as soon as you can.  If it is almost time for your next dose, take only that dose. Do not take double or extra doses. What may interact with this medicine? -cholestyramine -colestipol -digoxin -dofetilide -lithium -medicines for blood pressure -medicines for diabetes -medicines that relax muscles for surgery -other diuretics -steroid medicines like prednisone or cortisone This list may not describe all possible interactions. Give your health care provider a list of all the medicines, herbs, non-prescription drugs, or dietary supplements you use. Also tell them if you smoke, drink alcohol, or use illegal drugs. Some items may interact with your medicine. What should I watch for while using this medicine? Visit your doctor or health care professional for regular checks on your progress. Check your blood pressure as directed. Ask your doctor or health care professional what your blood pressure should be and when you should contact him or her. You may need to be on a special diet while taking this medicine. Ask your doctor. Check with your doctor or health care professional if you get an attack of severe diarrhea, nausea and vomiting, or if you sweat a lot. The loss of too much body fluid can make it dangerous  for you to take this medicine. You may get drowsy or dizzy. Do not drive, use machinery, or do anything that needs mental alertness until you know how this medicine affects you. Do not stand or sit up quickly, especially if you are an older patient. This reduces the risk of dizzy or fainting spells. Alcohol may interfere with the effect of this medicine. Avoid alcoholic drinks. This medicine may affect your blood sugar level. If you have diabetes, check with your doctor or health care professional before changing the dose of your diabetic medicine. This medicine can make you more sensitive to the sun. Keep out of the sun. If you cannot avoid being in the sun, wear protective clothing and use sunscreen. Do not use  sun lamps or tanning beds/booths. What side effects may I notice from receiving this medicine? Side effects that you should report to your doctor or health care professional as soon as possible: -allergic reactions such as skin rash or itching, hives, swelling of the lips, mouth, tongue, or throat -changes in vision -chest pain -eye pain -fast or irregular heartbeat -feeling faint or lightheaded, falls -gout attack -muscle pain or cramps -pain or difficulty when passing urine -pain, tingling, numbness in the hands or feet -redness, blistering, peeling or loosening of the skin, including inside the mouth -unusually weak or tired Side effects that usually do not require medical attention (report to your doctor or health care professional if they continue or are bothersome): -change in sex drive or performance -dry mouth -headache -stomach upset This list may not describe all possible side effects. Call your doctor for medical advice about side effects. You may report side effects to FDA at 1-800-FDA-1088. Where should I keep my medicine? Keep out of the reach of children. Store at room temperature between 15 and 30 degrees C (59 and 86 degrees F). Do not freeze. Protect from light and moisture. Keep container closed tightly. Throw away any unused medicine after the expiration date. NOTE: This sheet is a summary. It may not cover all possible information. If you have questions about this medicine, talk to your doctor, pharmacist, or health care provider.  2017 Elsevier/Gold Standard (2009-09-02 12:57:37)

## 2016-03-12 NOTE — Progress Notes (Signed)
Christina Hayes is a 66 y.o. female who presents to [facility] [place] today for leg swelling. Patient notes a history of right worse than left foot swelling going on now for over a week. She denies any chest pain palpitations shortness of breath injury or orthopnea. She denies any significant change in diet. She notes she's been a little more active than usual because she's been packing up her house in preparation for selling it. She denies any considerable pain. She's not really tried any treatment yet. She feels well otherwise.   Past Medical History:  Diagnosis Date  . Acid reflux   . Allergic rhinitis, cause unspecified   . Allergy   . Anemia    years ago   . Complication of anesthesia SLOW TO WAKE  . Cough   . DJD (degenerative joint disease)    spine  . Frequency of urination   . H/O hiatal hernia    small  . Head cold   . Headache(784.0)    migraqine- uses essential oils  . HYPERLIPIDEMIA, MIXED 12/03/2006   PT STATES NOT TAKING MED AS PRESCRIBED  . MIGRAINES, HX OF 12/03/2006  . Neuromuscular disorder ([place])   . Nocturia   . OSA on CPAP   . Osteopenia   . PONV (postoperative nausea and vomiting)    in past  . Shortness of breath   . Sleep apnea   . SUI (stress urinary incontinence, female)   . Urgency of urination   . VERTIGO, HX OF 12/03/2006   Past Surgical History:  Procedure Laterality Date  . COLONOSCOPY    . PUBOVAGINAL SLING  04/30/2011   Procedure: Gaynelle Arabian;  Surgeon: Bernestine Amass, MD;  Location[facility];  Service: Urology;  Laterality: N/A;  sub urethral sling    possible ower  . RIGID BRONCHOSCOPY N/A 04/23/2013   Procedure: LASER BRONCHOSCOPY;  Surgeon: Melrose Nakayama, MD;  Location: [facility];  Service: Thoracic;  Laterality: N/A;  . [place]  .  VIDEO BRONCHOSCOPY Bilateral 03/26/2013   Procedure: VIDEO BRONCHOSCOPY WITHOUT FLUORO;  Surgeon: Tanda Rockers, MD;  Location: [facility];  Service: Cardiopulmonary;  Laterality: Bilateral;  . VIDEO BRONCHOSCOPY N/A 04/23/2013   Procedure: VIDEO BRONCHOSCOPY;  Surgeon: Melrose Nakayama, MD;  Location: [facility];  Service: Thoracic;  Laterality: N/A;  . VIDEO BRONCHOSCOPY N/A 05/28/2014   Procedure: VIDEO BRONCHOSCOPY;  Surgeon: Melrose Nakayama, MD;  Location: [facility];  Service: Thoracic;  Laterality: N/A;   Social History  Substance Use Topics  . Smoking status: Never Smoker  . Smokeless tobacco: Never Used  . Alcohol use No   family history includes Allergies in her daughter; Alzheimer's disease in her father; Cancer in her maternal grandmother and mother; Cancer (age of onset: 11) in her daughter; Colon cancer in her mother; Colon polyps in her brother; Diabetes in her father and mother; Hyperlipidemia in her father.  ROS as above:  Medications: Current Outpatient Prescriptions  Medication Sig Dispense Refill  . acetaminophen (TYLENOL ARTHRITIS PAIN) 650 MG CR tablet Take 650 mg by mouth every 8 (eight) hours as needed for pain.    Marland Kitchen ALPRAZolam (XANAX) 0.5 MG tablet     . Calcium Carb-Cholecalciferol (SM CALCIUM/VITAMIN D) 600-800 MG-UNIT TABS Take by mouth.    . cetirizine (ZYRTEC) 10 MG tablet Take 1 tablet (10 mg total) by  mouth daily. 90 tablet 3  . Cholecalciferol (VITAMIN D PO) Take 800 Units by mouth daily.    . pantoprazole (PROTONIX) 40 MG tablet Take 1 tablet by mouth  daily 30 to 60 minutes  before first meal of the  day 90 tablet 3  . simvastatin (ZOCOR) 40 MG tablet Take 1 tablet by mouth  daily 90 tablet 3  . zolpidem (AMBIEN) 5 MG tablet Take 1 tablet (5 mg total) by mouth at bedtime as needed for sleep. 30 tablet 1  . SUPREP BOWEL PREP KIT 17.5-3.13-1.6 GM/180ML SOLN      No current facility-administered medications for this visit.    Allergies  Allergen Reactions    . Codeine Itching    *Can take Hydrocodone*  . Tramadol     headache  . Protriptyline Palpitations  . Vivactil [Protriptyline Hcl] Palpitations    Health Maintenance Health Maintenance  Topic Date Due  . COLONOSCOPY  06/08/2013  . MAMMOGRAM  01/10/2017  . PNA vac Low Risk Adult (2 of 2 - PPSV23) 03/25/2017  . TETANUS/TDAP  01/27/2024  . INFLUENZA VACCINE  Completed  . DEXA SCAN  Completed  . Hepatitis C Screening  Completed     Exam:  BP (!) 137/54   Pulse 75   Wt 198 lb (89.8 kg)   BMI 36.21 kg/m  Gen: Well NAD HEENT: EOMI,  MMM Lungs: Normal work of breathing. CTABL Heart: RRR no MRG Abd: NABS, Soft. Nondistended, Nontender Exts: Brisk capillary refill, warm and well perfused.  Bilateral lower extremities with 1+ pitting edema to mid shins bilaterally. Right greater than left. Pulses capillary refill and sensation intact distally. Nontender.   No results found for this or any previous visit (from the past 72 hour(s)). No results found.    Assessment and Plan: 66 y.o. female with  bilateral right worse than left edema. Likely dependent edema however we'll proceed with a mild workup including limited duplex ultrasound CBC CMP and TSH. If labs are unremarkable or proceed with treatment with low-dose hydrochlorothiazide and recheck in about a month or so.    Orders Placed This Encounter  Procedures  . US Venous Img Lower Bilateral    Standing Status:   Future    Standing Expiration Date:   05/10/2017    Order Specific Question:   Reason for Exam (SYMPTOM  OR DIAGNOSIS REQUIRED)    Answer:   eval leg swelling R>L    Order Specific Question:   Preferred imaging location?    Answer:   [facility]  . CBC  . COMPLETE METABOLIC PANEL WITH GFR  . TSH   Meds ordered this encounter  Medications  . SUPREP BOWEL PREP KIT 17.5-3.13-1.6 GM/180ML SOLN     Discussed warning signs or symptoms. Please see discharge instructions. Patient expresses  understanding.

## 2016-03-13 ENCOUNTER — Ambulatory Visit: Payer: Medicare Other

## 2016-03-13 LAB — COMPLETE METABOLIC PANEL WITH GFR
ALT: 23 U/L (ref 6–29)
AST: 30 U/L (ref 10–35)
Albumin: 4.1 g/dL (ref 3.6–5.1)
Alkaline Phosphatase: 98 U/L (ref 33–130)
BUN: 11 mg/dL (ref 7–25)
CALCIUM: 9.4 mg/dL (ref 8.6–10.4)
CO2: 27 mmol/L (ref 20–31)
CREATININE: 0.85 mg/dL (ref 0.50–0.99)
Chloride: 106 mmol/L (ref 98–110)
GFR, EST AFRICAN AMERICAN: 82 mL/min (ref >=60)
GFR, Est Non African American: 71 mL/min (ref >=60)
Glucose, Bld: 109 mg/dL — ABNORMAL HIGH (ref 65–99)
Potassium: 3.8 mmol/L (ref 3.5–5.3)
Sodium: 141 mmol/L (ref 135–146)
TOTAL PROTEIN: 6.8 g/dL (ref 6.1–8.1)
Total Bilirubin: 0.4 mg/dL (ref 0.2–1.2)

## 2016-03-13 LAB — CBC
HEMATOCRIT: 35.2 % (ref 35.0–45.0)
Hemoglobin: 11.5 g/dL — ABNORMAL LOW (ref 11.7–15.5)
MCH: 27.2 pg (ref 27.0–33.0)
MCHC: 32.7 g/dL (ref 32.0–36.0)
MCV: 83.2 fL (ref 80.0–100.0)
MPV: 9.2 fL (ref 7.5–12.5)
PLATELETS: 373 10*3/uL (ref 140–400)
RBC: 4.23 MIL/uL (ref 3.80–5.10)
RDW: 14.3 % (ref 11.0–15.0)
WBC: 8.4 10*3/uL (ref 3.8–10.8)

## 2016-03-13 LAB — TSH: TSH: 1.71 mIU/L

## 2016-03-13 MED ORDER — HYDROCHLOROTHIAZIDE 12.5 MG PO TABS: 12.5000 mg | ORAL_TABLET | Freq: Every day | ORAL | 1 refills | Status: DC

## 2016-03-13 NOTE — Addendum Note (Signed)
Addended by: Gregor Hams on: 03/13/2016 07:22 AM   Modules accepted: Orders

## 2016-03-15 ENCOUNTER — Other Ambulatory Visit (HOSPITAL_BASED_OUTPATIENT_CLINIC_OR_DEPARTMENT_OTHER): Payer: Medicare Other

## 2016-03-16 ENCOUNTER — Ambulatory Visit (HOSPITAL_BASED_OUTPATIENT_CLINIC_OR_DEPARTMENT_OTHER)
Admission: RE | Admit: 2016-03-16 | Discharge: 2016-03-16 | Disposition: A | Discharge status: Home / Self Care | Payer: Medicare Other | Source: Ambulatory Visit | Attending: Family Medicine | Admitting: Family Medicine

## 2016-03-16 DIAGNOSIS — M7989 Other specified soft tissue disorders: Secondary | ICD-10-CM | POA: Diagnosis not present

## 2016-03-16 DIAGNOSIS — R600 Localized edema: Secondary | ICD-10-CM | POA: Diagnosis not present

## 2016-03-27 ENCOUNTER — Ambulatory Visit
Admission: RE | Admit: 2016-03-27 | Discharge: 2016-03-27 | Disposition: A | Discharge status: Home / Self Care | Payer: Medicare Other | Source: Ambulatory Visit | Attending: Family Medicine | Admitting: Family Medicine

## 2016-03-27 DIAGNOSIS — Z1231 Encounter for screening mammogram for malignant neoplasm of breast: Secondary | ICD-10-CM

## 2016-04-09 ENCOUNTER — Ambulatory Visit (INDEPENDENT_AMBULATORY_CARE_PROVIDER_SITE_OTHER): Payer: Medicare Other | Admitting: Family Medicine

## 2016-04-09 ENCOUNTER — Encounter: Payer: Self-pay | Admitting: Family Medicine

## 2016-04-09 VITALS — BP 130/69 | HR 79 | Wt 194.0 lb

## 2016-04-09 DIAGNOSIS — M7989 Other specified soft tissue disorders: Secondary | ICD-10-CM | POA: Diagnosis not present

## 2016-04-09 MED ORDER — HYDROCHLOROTHIAZIDE 25 MG PO TABS: 25.0000 mg | ORAL_TABLET | Freq: Every day | ORAL | 1 refills | Status: DC

## 2016-04-09 MED ORDER — ZOLPIDEM TARTRATE 5 MG PO TABS: 5.0000 mg | ORAL_TABLET | Freq: Every evening | ORAL | 1 refills | Status: DC | PRN

## 2016-04-09 NOTE — Patient Instructions (Signed)
Thank you for coming in today. Increase HCTZ to 25 mg daily.  Recheck in 2 months or so.  Return sooner if needed.    Edema Edema is when you have too much fluid in your body or under your skin. Edema may make your legs, feet, and ankles swell up. Swelling is also common in looser tissues, like around your eyes. This is a common condition. It gets more common as you get older. There are many possible causes of edema. Eating too much salt (sodium) and being on your feet or sitting for a long time can cause edema in your legs, feet, and ankles. Hot weather may make edema worse. Edema is usually painless. Your skin may look swollen or shiny. Follow these instructions at home:  Keep the swollen body part raised (elevated) above the level of your heart when you are sitting or lying down.  Do not sit still or stand for a long time.  Do not wear tight clothes. Do not wear garters on your upper legs.  Exercise your legs. This can help the swelling go down.  Wear elastic bandages or support stockings as told by your doctor.  Eat a low-salt (low-sodium) diet to reduce fluid as told by your doctor.  Depending on the cause of your swelling, you may need to limit how much fluid you drink (fluid restriction).  Take over-the-counter and prescription medicines only as told by your doctor. Contact a doctor if:  Treatment is not working.  You have heart, liver, or kidney disease and have symptoms of edema.  You have sudden and unexplained weight gain. Get help right away if:  You have shortness of breath or chest pain.  You cannot breathe when you lie down.  You have pain, redness, or warmth in the swollen areas.  You have heart, liver, or kidney disease and get edema all of a sudden.  You have a fever and your symptoms get worse all of a sudden. Summary  Edema is when you have too much fluid in your body or under your skin.  Edema may make your legs, feet, and ankles swell up. Swelling  is also common in looser tissues, like around your eyes.  Raise (elevate) the swollen body part above the level of your heart when you are sitting or lying down.  Follow your doctor's instructions about diet and how much fluid you can drink (fluid restriction). This information is not intended to replace advice given to you by your health care provider. Make sure you discuss any questions you have with your health care provider. Document Released: 06/27/2007 Document Revised: 01/27/2016 Document Reviewed: 01/27/2016 Elsevier Interactive Patient Education  2017 [employer].

## 2016-04-09 NOTE — Progress Notes (Signed)
Christina Hayes is a 66 y.o. female who presents to [facility] today for leg swelling. Patient was seen a month ago for leg swelling and prescribed 12.5 mg of hydrochlorothiazide. She notes this is helped only a little. She notes burning and swelling occasionally. She denies any fevers or chills and feels well otherwise.   Past Medical History:  Diagnosis Date  . Acid reflux   . Allergic rhinitis, cause unspecified   . Allergy   . Anemia    years ago   . Complication of anesthesia SLOW TO WAKE  . Cough   . DJD (degenerative joint disease)    spine  . Frequency of urination   . H/O hiatal hernia    small  . Head cold   . Headache(784.0)    migraqine- uses essential oils  . HYPERLIPIDEMIA, MIXED 12/03/2006   PT STATES NOT TAKING MED AS PRESCRIBED  . MIGRAINES, HX OF 12/03/2006  . Neuromuscular disorder ([place])   . Nocturia   . OSA on CPAP   . Osteopenia   . PONV (postoperative nausea and vomiting)    in past  . Shortness of breath   . Sleep apnea   . SUI (stress urinary incontinence, female)   . Urgency of urination   . VERTIGO, HX OF 12/03/2006   Past Surgical History:  Procedure Laterality Date  . COLONOSCOPY    . PUBOVAGINAL SLING  04/30/2011   Procedure: Gaynelle Arabian;  Surgeon: Bernestine Amass, MD;  Location: [facility];  Service: Urology;  Laterality: N/A;  sub urethral sling    possible ower  . RIGID BRONCHOSCOPY N/A 04/23/2013   Procedure: LASER BRONCHOSCOPY;  Surgeon: Melrose Nakayama, MD;  Location: [place];  Service: Thoracic;  Laterality: N/A;  . [place]  . VIDEO BRONCHOSCOPY Bilateral 03/26/2013   Procedure: VIDEO BRONCHOSCOPY WITHOUT FLUORO;  Surgeon: Tanda Rockers, MD;  [facility];  Service: Cardiopulmonary;  Laterality: Bilateral;  . VIDEO  BRONCHOSCOPY N/A 04/23/2013   Procedure: VIDEO BRONCHOSCOPY;  Surgeon: Melrose Nakayama, MD;  Location: [place];  Service: Thoracic;  Laterality: N/A;  . VIDEO BRONCHOSCOPY N/A 05/28/2014   Procedure: VIDEO BRONCHOSCOPY;  Surgeon: Melrose Nakayama, MD;  Location: [place];  Service: Thoracic;  Laterality: N/A;   Social History  Substance Use Topics  . Smoking status: Never Smoker  . Smokeless tobacco: Never Used  . Alcohol use No   family history includes Allergies in her daughter; Alzheimer's disease in her father; Breast cancer in her daughter and maternal grandmother; Cancer in her maternal grandmother and mother; Cancer (age of onset: 32) in her daughter; Colon cancer in her mother; Colon polyps in her brother; Diabetes in her father and mother; Hyperlipidemia in her father.  ROS as above:  Medications: Current Outpatient Prescriptions  Medication Sig Dispense Refill  . acetaminophen (TYLENOL ARTHRITIS PAIN) 650 MG CR tablet Take 650 mg by mouth every 8 (eight) hours as needed for pain.    Marland Kitchen ALPRAZolam (XANAX) 0.5 MG tablet     . Calcium Carb-Cholecalciferol (SM CALCIUM/VITAMIN D) 600-800 MG-UNIT TABS Take by mouth.    . cetirizine (ZYRTEC) 10 MG tablet Take 1 tablet (10 mg total) by mouth daily. 90 tablet 3  . Cholecalciferol (VITAMIN D PO) Take 800 Units by mouth daily.    . hydrochlorothiazide (HYDRODIURIL) 25 MG tablet Take 1  tablet (25 mg total) by mouth daily. 90 tablet 1  . pantoprazole (PROTONIX) 40 MG tablet Take 1 tablet by mouth  daily 30 to 60 minutes  before first meal of the  day 90 tablet 3  . simvastatin (ZOCOR) 40 MG tablet Take 1 tablet by mouth  daily 90 tablet 3  . SUPREP BOWEL PREP KIT 17.5-3.13-1.6 GM/180ML SOLN     . zolpidem (AMBIEN) 5 MG tablet Take 1 tablet (5 mg total) by mouth at bedtime as needed for sleep. 30 tablet 1   No current facility-administered medications for this visit.    Allergies  Allergen Reactions  . Codeine Itching    *Can take  Hydrocodone*  . Tramadol     headache  . Protriptyline Palpitations  . Vivactil [Protriptyline Hcl] Palpitations    Health Maintenance Health Maintenance  Topic Date Due  . COLONOSCOPY  06/08/2013  . PNA vac Low Risk Adult (2 of 2 - PPSV23) 03/25/2017  . MAMMOGRAM  03/28/2018  . TETANUS/TDAP  01/27/2024  . INFLUENZA VACCINE  Completed  . DEXA SCAN  Completed  . Hepatitis C Screening  Completed     Exam:  BP 130/69   Pulse 79   Wt 194 lb (88 kg)   BMI 35.48 kg/m  Gen: Well NAD HEENT: EOMI,  MMM Lungs: Normal work of breathing. CTABL Heart: RRR no MRG Abd: NABS, Soft. Nondistended, Nontender Exts: Brisk capillary refill, warm and well perfused. Trace edema bilateral ankles.   No results found for this or any previous visit (from the past 72 hour(s)). No results found.    Assessment and Plan: 66 y.o. female with continued edema bilateral lower extremities. Laboratory and ultrasound assessment last month was unremarkable. Will increase hydrochlorothiazide to 25 mg. Patient would like to avoid Lasix as her husband had acute kidney injury on this medication. We'll recheck in a few months if not better.   No orders of the defined types were placed in this encounter.  Meds ordered this encounter  Medications  . hydrochlorothiazide (HYDRODIURIL) 25 MG tablet    Sig: Take 1 tablet (25 mg total) by mouth daily.    Dispense:  90 tablet    Refill:  1  . zolpidem (AMBIEN) 5 MG tablet    Sig: Take 1 tablet (5 mg total) by mouth at bedtime as needed for sleep.    Dispense:  30 tablet    Refill:  1     Discussed warning signs or symptoms. Please see discharge instructions. Patient expresses understanding.

## 2016-04-11 ENCOUNTER — Encounter: Payer: Self-pay | Admitting: Family Medicine

## 2016-04-11 DIAGNOSIS — K219 Gastro-esophageal reflux disease without esophagitis: Secondary | ICD-10-CM

## 2016-04-11 DIAGNOSIS — E782 Mixed hyperlipidemia: Secondary | ICD-10-CM

## 2016-04-12 MED ORDER — SIMVASTATIN 40 MG PO TABS: ORAL_TABLET | ORAL | 3 refills | Status: DC

## 2016-04-12 MED ORDER — PANTOPRAZOLE SODIUM 40 MG PO TBEC: 40.0000 mg | DELAYED_RELEASE_TABLET | Freq: Every day | ORAL | 3 refills | Status: DC

## 2016-05-10 ENCOUNTER — Other Ambulatory Visit: Payer: Self-pay | Admitting: Thoracic Surgery (Cardiothoracic Vascular Surgery)

## 2016-05-10 DIAGNOSIS — D3A00 Benign carcinoid tumor of unspecified site: Secondary | ICD-10-CM

## 2016-06-11 ENCOUNTER — Encounter: Payer: Self-pay | Admitting: Family Medicine

## 2016-06-11 ENCOUNTER — Ambulatory Visit (INDEPENDENT_AMBULATORY_CARE_PROVIDER_SITE_OTHER): Payer: Medicare Other

## 2016-06-11 ENCOUNTER — Ambulatory Visit (INDEPENDENT_AMBULATORY_CARE_PROVIDER_SITE_OTHER): Payer: Medicare Other | Admitting: Family Medicine

## 2016-06-11 VITALS — BP 117/66 | HR 78 | Wt 187.0 lb

## 2016-06-11 DIAGNOSIS — M25512 Pain in left shoulder: Secondary | ICD-10-CM

## 2016-06-11 DIAGNOSIS — M7989 Other specified soft tissue disorders: Secondary | ICD-10-CM | POA: Diagnosis not present

## 2016-06-11 DIAGNOSIS — D649 Anemia, unspecified: Secondary | ICD-10-CM

## 2016-06-11 DIAGNOSIS — M85812 Other specified disorders of bone density and structure, left shoulder: Secondary | ICD-10-CM

## 2016-06-11 DIAGNOSIS — E782 Mixed hyperlipidemia: Secondary | ICD-10-CM

## 2016-06-11 DIAGNOSIS — S4992XA Unspecified injury of left shoulder and upper arm, initial encounter: Secondary | ICD-10-CM | POA: Diagnosis not present

## 2016-06-11 LAB — CBC
HEMATOCRIT: 38.2 % (ref 35.0–45.0)
Hemoglobin: 12.7 g/dL (ref 11.7–15.5)
MCH: 27.1 pg (ref 27.0–33.0)
MCHC: 33.2 g/dL (ref 32.0–36.0)
MCV: 81.4 fL (ref 80.0–100.0)
MPV: 9.4 fL (ref 7.5–12.5)
PLATELETS: 442 10*3/uL — AB (ref 140–400)
RBC: 4.69 MIL/uL (ref 3.80–5.10)
RDW: 14.8 % (ref 11.0–15.0)
WBC: 8.3 10*3/uL (ref 3.8–10.8)

## 2016-06-11 NOTE — Patient Instructions (Addendum)
Thank you for coming in today. Try to limit sodium to less than '2000mg'$  per day.  Keep track of intakes and look at nutrition lables.  Try to use the compression stockings.  You should hear about the heart and leg ultrasound soon.   Let me know if you do not hear anything.   Get fasting labs in the near future.   Recheck in 1 month.     Secondary Shoulder Impingement Syndrome Rehab Ask your health care provider which exercises are safe for you. Do exercises exactly as told by your health care provider and adjust them as directed. It is normal to feel mild stretching, pulling, tightness, or discomfort as you do these exercises, but you should stop right away if you feel sudden pain or your pain gets worse. Do not begin these exercises until told by your health care provider. Stretching and range of motion exercise This exercise warms up your muscles and joints and improves the movement and flexibility of your neck and shoulder. This exercise also helps to relieve pain and stiffness. Exercise A: Cervical side bend   1. Using good posture, sit on a stable chair, or stand up. 2. Without moving your shoulders, slowly tilt your left / right ear toward your left / right shoulder until you feel a stretch in your neck muscles. You should be looking straight ahead. 3. Hold for __________ seconds. 4. Slowly return to the starting position. 5. Repeat on your left / right side. Repeat __________ times. Complete this exercise __________ times a day. Strengthening exercises These exercises build strength and endurance in your shoulder. Endurance is the ability to use your muscles for a long time, even after they get tired. Exercise B: Scapular protraction, supine   1. Lie on your back on a firm surface. Hold a __________ weight in your left / right hand. 2. Raise your left / right arm straight into the air so your hand is directly above your shoulder joint. 3. Push the weight into the air so your  shoulder lifts off of the surface that you are lying on. Do not move your head, neck, or back. 4. Hold for __________ seconds. 5. Slowly return to the starting position. Let your muscles relax completely before you repeat this exercise. Repeat __________ times. Complete this exercise __________ times a day. Exercise C: Scapular retraction   1. Sit in a stable chair without armrests, or stand. 2. Secure an exercise band to a stable object in front of you so the band is at shoulder height. 3. Hold one end of the exercise band in each hand. Your palms should face down. 4. Squeeze your shoulder blades together and move your elbows slightly behind you. Do not shrug your shoulders while you do this. 5. Hold for __________ seconds. 6. Slowly return to the starting position. Repeat __________ times. Complete this exercise __________ times a day. Exercise D: Shoulder extension with scapular retraction   1. Sit in a stable chair without armrests, or stand. 2. Secure an exercise band to a stable object in front of you where it is above shoulder height. 3. Hold one end of the exercise band in each hand. 4. Straighten your elbows and lift your hands up to shoulder height. 5. Squeeze your shoulder blades together and pull your hands down to the sides of your thighs. Stop when your hands are straight down by your sides. Do not let your hands go behind your body. 6. Hold for __________ seconds. 7. Slowly  return to the starting position. Repeat __________ times. Complete this exercise __________ times a day. Exercise E: Shoulder abduction  1. Sit in a stable chair without armrests, or stand. 2. If directed, hold a __________ weight in your left / right hand. 3. Start with your arms straight down. Turn your left / right hand so your palm faces in, toward your body. 4. Slowly lift your left / right hand out to your side. Do not lift your hand above shoulder height.  Keep your arms straight.  Avoid  shrugging your shoulder while you do this movement. Keep your shoulder blade tucked down toward the middle of your back. 5. Hold for __________ seconds. 6. Slowly lower your arm, and return to the starting position. Repeat __________ times. Complete this exercise __________ times a day. This information is not intended to replace advice given to you by your health care provider. Make sure you discuss any questions you have with your health care provider. Document Released: 01/08/2005 Document Revised: 09/15/2015 Document Reviewed: 12/11/2014 Elsevier Interactive Patient Education  2017 [employer].

## 2016-06-11 NOTE — Progress Notes (Signed)
Christina Hayes is a 66 y.o. female who presents to [facility] today for leg swelling and shoulder pain.  Patient returns to clinic complaining of bilateral leg swelling. This is been ongoing for several months. She's had some limited workup and limited trials of treatment which have not been very helpful. She's taking hydrochlorothiazide 25 mg daily which helps a little. Additionally she uses compression stockings which helped some too.  She does not use compression stockings daily. She is reluctant to take Lasix because her husband suffered acute kidney injury on this medicine. She denies chest pain palpitations orthopnea fevers or chills.  Additionally patient has left shoulder pain. This is been ongoing for about a month. She's been moving house recently and notes the pain is worsening. Pain is worse with lifting and overhead motion and reaching back. No radiating pain weakness or numbness.   Past Medical History:  Diagnosis Date  . Acid reflux   . Allergic rhinitis, cause unspecified   . Allergy   . Anemia    years ago   . Complication of anesthesia SLOW TO WAKE  . Cough   . DJD (degenerative joint disease)    spine  . Frequency of urination   . H/O hiatal hernia    small  . Head cold   . Headache(784.0)    migraqine- uses essential oils  . HYPERLIPIDEMIA, MIXED 12/03/2006   PT STATES NOT TAKING MED AS PRESCRIBED  . MIGRAINES, HX OF 12/03/2006  . Neuromuscular disorder ([place])   . Nocturia   . OSA on CPAP   . Osteopenia   . PONV (postoperative nausea and vomiting)    in past  . Shortness of breath   . Sleep apnea   . SUI (stress urinary incontinence, female)   . Urgency of urination   . VERTIGO, HX OF 12/03/2006   Past Surgical History:  Procedure Laterality Date  . COLONOSCOPY    . PUBOVAGINAL SLING  04/30/2011   Procedure: Gaynelle Arabian;   Surgeon: Bernestine Amass, MD;  Location: [facility];  Service: Urology;  Laterality: N/A;  sub urethral sling    possible ower  . RIGID BRONCHOSCOPY N/A 04/23/2013   Procedure: LASER BRONCHOSCOPY;  Surgeon: Melrose Nakayama, MD;  Location: [place];  Service: Thoracic;  Laterality: N/A;  . [place]  . VIDEO BRONCHOSCOPY Bilateral 03/26/2013   Procedure: VIDEO BRONCHOSCOPY WITHOUT FLUORO;  Surgeon: Tanda Rockers, MD;  [facility];  Service: Cardiopulmonary;  Laterality: Bilateral;  . VIDEO BRONCHOSCOPY N/A 04/23/2013   Procedure: VIDEO BRONCHOSCOPY;  Surgeon: Melrose Nakayama, MD;  Location: [place];  Service: Thoracic;  Laterality: N/A;  . VIDEO BRONCHOSCOPY N/A 05/28/2014   Procedure: VIDEO BRONCHOSCOPY;  Surgeon: Melrose Nakayama, MD;  Location: [place];  Service: Thoracic;  Laterality: N/A;   Social History  Substance Use Topics  . Smoking status: Never Smoker  . Smokeless tobacco: Never Used  . Alcohol use No   family history includes Allergies in her daughter; Alzheimer's disease in her father; Breast cancer in her daughter and maternal grandmother; Cancer in her maternal grandmother and mother; Cancer (age of onset: 52) in her daughter; Colon cancer in her mother; Colon polyps in her brother; Diabetes in her father and mother; Hyperlipidemia in her father.  ROS as above:  Medications: Current Outpatient Prescriptions  Medication Sig Dispense Refill  .  acetaminophen (TYLENOL ARTHRITIS PAIN) 650 MG CR tablet Take 650 mg by mouth every 8 (eight) hours as needed for pain.    Marland Kitchen ALPRAZolam (XANAX) 0.5 MG tablet     . Calcium Carb-Cholecalciferol (SM CALCIUM/VITAMIN D) 600-800 MG-UNIT TABS Take by mouth.    . cetirizine (ZYRTEC) 10 MG tablet Take 1 tablet (10 mg total) by mouth daily. 90 tablet 3  . Cholecalciferol (VITAMIN D PO) Take 800 Units by mouth daily.    . hydrochlorothiazide (HYDRODIURIL) 25  MG tablet Take 1 tablet (25 mg total) by mouth daily. 90 tablet 1  . pantoprazole (PROTONIX) 40 MG tablet Take 1 tablet (40 mg total) by mouth daily. 90 tablet 3  . simvastatin (ZOCOR) 40 MG tablet Take 1 tablet by mouth  daily 90 tablet 3  . zolpidem (AMBIEN) 5 MG tablet Take 1 tablet (5 mg total) by mouth at bedtime as needed for sleep. 30 tablet 1   No current facility-administered medications for this visit.    Allergies  Allergen Reactions  . Codeine Itching    *Can take Hydrocodone*  . Tramadol     headache  . Protriptyline Palpitations  . Vivactil [Protriptyline Hcl] Palpitations    Health Maintenance Health Maintenance  Topic Date Due  . COLONOSCOPY  06/08/2013  . INFLUENZA VACCINE  08/22/2016  . PNA vac Low Risk Adult (2 of 2 - PPSV23) 03/25/2017  . MAMMOGRAM  03/28/2018  . TETANUS/TDAP  01/27/2024  . DEXA SCAN  Completed  . Hepatitis C Screening  Completed     Exam:  BP 117/66   Pulse 78   Wt 187 lb (84.8 kg)   BMI 34.20 kg/m  Gen: Well NAD HEENT: EOMI,  MMM Lungs: Normal work of breathing. CTABL Heart: RRR no MRG Abd: NABS, Soft. Nondistended, Nontender Exts: Brisk capillary refill, warm and well perfused. Trace edema bilateral lower extremities Left shoulder normal-appearing mildly tender at the acromial clavicular joint. Normal motion  Positive Hawkins and Neer's test. Negative empty can test. Mildly positive crossover arm compression test. Strength is intact. Positive Yergason's and speeds test.   X-ray left shoulder pending   Assessment and Plan: 66 y.o. female with  Lower extremity swelling. Unclear etiology. Plan to continue the workup to include a lower extremity reflux assessment as well as echocardiogram. Check basic fasting labs as well as check labs for following up anemia as well as hyperlipidemia. Return following workup.  As for the shoulder at think is probably due to subacromial bursitis versus proximal biceps tendinopathy with  possible's acromial clavicular DJD. X-ray of the shoulder is pending. Plan for home exercise program and recheck in one month.   Orders Placed This Encounter  Procedures  . [place] LE VEN REFLUX BILAT    Standing Status:   Future    Standing Expiration Date:   08/11/2017    Order Specific Question:   Reason for Exam (SYMPTOM  OR DIAGNOSIS REQUIRED)    Answer:   eval leg swelling bilat    Order Specific Question:   Preferred imaging location?    Answer:   [facility]  . DG Shoulder Left    Standing Status:   Future    Number of Occurrences:   1    Standing Expiration Date:   08/11/2017    Order Specific Question:   Reason for Exam (SYMPTOM  OR DIAGNOSIS REQUIRED)    Answer:   eval shoulder pain    Order Specific Question:   Preferred imaging location?  Answer:   [facility]    Order Specific Question:   Radiology Contrast Protocol - do NOT remove file path    Answer:   [email or web address]  . CBC  . COMPLETE METABOLIC PANEL WITH GFR  . Lipid Panel w/reflex Direct LDL  . Ferritin  . Iron and TIBC  . ECHOCARDIOGRAM COMPLETE    Standing Status:   Future    Standing Expiration Date:   09/11/2017    Order Specific Question:   Where should this test be performed    Answer:   [facility]    Order Specific Question:   Perflutren DEFINITY (image enhancing agent) should be administered unless hypersensitivity or allergy exist    Answer:   Administer Perflutren    Order Specific Question:   Expected Date:    Answer:   1 month   No orders of the defined types were placed in this encounter.    Discussed warning signs or symptoms. Please see discharge instructions. Patient expresses understanding.

## 2016-06-12 LAB — COMPLETE METABOLIC PANEL WITH GFR
ALT: 25 U/L (ref 6–29)
AST: 32 U/L (ref 10–35)
Albumin: 4.5 g/dL (ref 3.6–5.1)
Alkaline Phosphatase: 130 U/L (ref 33–130)
BILIRUBIN TOTAL: 0.6 mg/dL (ref 0.2–1.2)
BUN: 10 mg/dL (ref 7–25)
CO2: 28 mmol/L (ref 20–31)
CREATININE: 0.79 mg/dL (ref 0.50–0.99)
Calcium: 9.9 mg/dL (ref 8.6–10.4)
Chloride: 100 mmol/L (ref 98–110)
GFR, Est Non African American: 78 mL/min (ref >=60)
Glucose, Bld: 97 mg/dL (ref 65–99)
Potassium: 3.8 mmol/L (ref 3.5–5.3)
Sodium: 140 mmol/L (ref 135–146)
TOTAL PROTEIN: 7.5 g/dL (ref 6.1–8.1)

## 2016-06-12 LAB — IRON AND TIBC
%SAT: 12 % (ref 11–50)
IRON: 51 ug/dL (ref 45–160)
TIBC: 439 ug/dL (ref 250–450)
UIBC: 388 ug/dL

## 2016-06-12 LAB — LIPID PANEL W/REFLEX DIRECT LDL
Cholesterol: 199 mg/dL (ref <200)
HDL: 80 mg/dL (ref >50)
LDL-CHOLESTEROL: 94 mg/dL
Non-HDL Cholesterol (Calc): 119 mg/dL (ref <130)
TRIGLYCERIDES: 152 mg/dL — AB (ref <150)
Total CHOL/HDL Ratio: 2.5 Ratio (ref <5.0)

## 2016-06-12 LAB — FERRITIN: FERRITIN: 33 ng/mL (ref 20–288)

## 2016-06-19 ENCOUNTER — Ambulatory Visit (INDEPENDENT_AMBULATORY_CARE_PROVIDER_SITE_OTHER): Payer: Medicare Other | Admitting: Thoracic Surgery (Cardiothoracic Vascular Surgery)

## 2016-06-19 ENCOUNTER — Encounter: Payer: Self-pay | Admitting: Thoracic Surgery (Cardiothoracic Vascular Surgery)

## 2016-06-19 ENCOUNTER — Ambulatory Visit
Admission: RE | Admit: 2016-06-19 | Discharge: 2016-06-19 | Disposition: A | Discharge status: Home / Self Care | Payer: Medicare Other | Source: Ambulatory Visit | Attending: Thoracic Surgery (Cardiothoracic Vascular Surgery) | Admitting: Thoracic Surgery (Cardiothoracic Vascular Surgery)

## 2016-06-19 VITALS — BP 108/60 | HR 70 | Resp 16 | Ht 62.25 in | Wt 185.0 lb

## 2016-06-19 DIAGNOSIS — D3A00 Benign carcinoid tumor of unspecified site: Secondary | ICD-10-CM

## 2016-06-19 DIAGNOSIS — D3A090 Benign carcinoid tumor of the bronchus and lung: Secondary | ICD-10-CM | POA: Diagnosis not present

## 2016-06-19 DIAGNOSIS — Z09 Encounter for follow-up examination after completed treatment for conditions other than malignant neoplasm: Secondary | ICD-10-CM

## 2016-06-19 NOTE — Progress Notes (Signed)
[address]       [place] [address]             [phone]    HPI: Mrs. Christina Hayes returns for a scheduled one-year follow-up appointment  Mrs. Christina Hayes is a 66 year old woman who presented with recurrent wheezing and respiratory infections. 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 I resected that endobronchially and did a laser ablation in April 2015. Surveillance bronchoscopy in 2016 showed no evidence of recurrence. She has been followed with CT scans since then.  She's been feeling well. She denies unusual cough, wheezing, hemoptysis, shortness of breath, or respiratory infections. She does not have any chest pain, pressure, or tightness. Past Medical History:  Diagnosis Date  . Acid reflux   . Allergic rhinitis, cause unspecified   . Allergy   . Anemia    years ago   . Complication of anesthesia SLOW TO WAKE  . Cough   . DJD (degenerative joint disease)    spine  . Frequency of urination   . H/O hiatal hernia    small  . Head cold   . Headache(784.0)    migraqine- uses essential oils  . HYPERLIPIDEMIA, MIXED 12/03/2006   PT STATES NOT TAKING MED AS PRESCRIBED  . MIGRAINES, HX OF 12/03/2006  . Neuromuscular disorder ([place])   . Nocturia   . OSA on CPAP   . Osteopenia   . PONV (postoperative nausea and vomiting)    in past  . Shortness of breath   . Sleep apnea   . SUI (stress urinary incontinence, female)   . Urgency of urination   . VERTIGO, HX OF 12/03/2006     Current Outpatient Prescriptions  Medication Sig Dispense Refill  . ALPRAZolam (XANAX) 0.5 MG tablet     . Calcium Carb-Cholecalciferol (SM CALCIUM/VITAMIN D) 600-800 MG-UNIT TABS Take by mouth.    . cetirizine (ZYRTEC) 10 MG tablet Take 1 tablet (10 mg total) by mouth daily. 90 tablet 3  . Cholecalciferol (VITAMIN D PO) Take 800 Units by mouth daily.    . hydrochlorothiazide (HYDRODIURIL) 25 MG tablet Take 1 tablet (25 mg total) by mouth  daily. 90 tablet 1  . pantoprazole (PROTONIX) 40 MG tablet Take 1 tablet (40 mg total) by mouth daily. 90 tablet 3  . simvastatin (ZOCOR) 40 MG tablet Take 1 tablet by mouth  daily 90 tablet 3  . zolpidem (AMBIEN) 5 MG tablet Take 1 tablet (5 mg total) by mouth at bedtime as needed for sleep. 30 tablet 1  . acetaminophen (TYLENOL ARTHRITIS PAIN) 650 MG CR tablet Take 650 mg by mouth every 8 (eight) hours as needed for pain.     No current facility-administered medications for this visit.     Physical Exam BP 108/60 (BP Location: Right Arm, Patient Position: Sitting, Cuff Size: Large)   Pulse 70   Resp 16   Ht 5' 2.25" (1.581 m)   Wt 185 lb (83.9 kg)   SpO2 97% Comment: ON RA  BMI 33.59 kg/m  66 year old woman in no acute distress Alert and oriented 3 with no focal deficits Cardiac regular rate and rhythm normal S1 and S2 Lungs clear with equal breath sounds bilaterally  Diagnostic Tests: CT CHEST WITHOUT CONTRAST  TECHNIQUE: Multidetector CT imaging of the chest was performed following the standard protocol without IV contrast.  COMPARISON:  06/07/2015  FINDINGS: Cardiovascular: The heart size is normal. No pericardial effusion.  Coronary artery calcification is noted.  Mediastinum/Nodes: No mediastinal lymphadenopathy. No evidence for gross hilar lymphadenopathy although assessment is limited by the lack of intravenous contrast on today's study. Tiny hiatal hernia. Esophagus unremarkable.  Lungs/Pleura: No focal airspace consolidation. No pulmonary edema or pleural effusion. Scattered ground-glass attenuation in the lower lungs symmetrically may be related atelectasis or small airways disease. There is a 4 mm right lower lobe pulmonary nodule, unchanged in the interval and consistent with benign etiology. 4 mm left lower lobe nodule seen on image 53 series 4 is also unchanged.  Upper Abdomen: Stable appearance subcapsular low-density medial segment left liver  posteriorly, likely focal fatty deposition.  Musculoskeletal: Bone windows reveal no worrisome lytic or sclerotic osseous lesions.  IMPRESSION: 1. Stable exam. No features to suggest recurrent or metastatic disease in the chest. 2. Coronary artery atherosclerosis.   Electronically Signed   By: Misty Stanley M.D.   On: 06/19/2016 12:49 I personally reviewed the CT chest and concur with the findings noted above.  Impression: 66 year old woman who is now 3 years out from endobronchial resection and laser ablation of the left mainstem carcinoid tumor. She has no evidence recurrent disease.  She has 2 tiny 4 mm lung nodules that a been stable over time.  Coronary artery atherosclerosis- coronary calcium complication noted on CT scan. She is on Zocor. She has no symptoms to suggest significant coronary disease.  Plan: Return in one year with CT chest  Melrose Nakayama, MD [facility] [phone]

## 2016-06-27 ENCOUNTER — Ambulatory Visit (HOSPITAL_COMMUNITY): Payer: Medicare Other | Attending: Cardiology

## 2016-06-27 ENCOUNTER — Encounter: Payer: Self-pay | Admitting: Family Medicine

## 2016-06-27 ENCOUNTER — Other Ambulatory Visit: Payer: Self-pay

## 2016-06-27 DIAGNOSIS — Z6833 Body mass index (BMI) 33.0-33.9, adult: Secondary | ICD-10-CM | POA: Insufficient documentation

## 2016-06-27 DIAGNOSIS — D649 Anemia, unspecified: Secondary | ICD-10-CM

## 2016-06-27 DIAGNOSIS — G4730 Sleep apnea, unspecified: Secondary | ICD-10-CM | POA: Diagnosis not present

## 2016-06-27 DIAGNOSIS — E782 Mixed hyperlipidemia: Secondary | ICD-10-CM | POA: Diagnosis not present

## 2016-06-27 DIAGNOSIS — E785 Hyperlipidemia, unspecified: Secondary | ICD-10-CM | POA: Diagnosis not present

## 2016-06-27 DIAGNOSIS — E669 Obesity, unspecified: Secondary | ICD-10-CM | POA: Diagnosis not present

## 2016-06-27 DIAGNOSIS — M7989 Other specified soft tissue disorders: Secondary | ICD-10-CM | POA: Insufficient documentation

## 2016-07-12 ENCOUNTER — Ambulatory Visit: Payer: Medicare Other | Admitting: Family Medicine

## 2016-07-17 ENCOUNTER — Ambulatory Visit (HOSPITAL_COMMUNITY)
Admission: RE | Admit: 2016-07-17 | Discharge: 2016-07-17 | Disposition: A | Discharge status: Home / Self Care | Payer: Medicare Other | Source: Ambulatory Visit | Attending: Cardiovascular Disease | Admitting: Cardiovascular Disease

## 2016-07-17 DIAGNOSIS — D649 Anemia, unspecified: Secondary | ICD-10-CM | POA: Diagnosis not present

## 2016-07-17 DIAGNOSIS — M7989 Other specified soft tissue disorders: Secondary | ICD-10-CM

## 2016-07-17 DIAGNOSIS — E782 Mixed hyperlipidemia: Secondary | ICD-10-CM | POA: Diagnosis not present

## 2016-07-19 ENCOUNTER — Encounter: Payer: Self-pay | Admitting: Family Medicine

## 2016-07-19 ENCOUNTER — Ambulatory Visit (INDEPENDENT_AMBULATORY_CARE_PROVIDER_SITE_OTHER): Payer: Medicare Other | Admitting: Family Medicine

## 2016-07-19 VITALS — BP 111/72 | HR 84 | Ht 62.0 in | Wt 183.0 lb

## 2016-07-19 DIAGNOSIS — R609 Edema, unspecified: Secondary | ICD-10-CM | POA: Diagnosis not present

## 2016-07-19 DIAGNOSIS — M7989 Other specified soft tissue disorders: Secondary | ICD-10-CM

## 2016-07-19 MED ORDER — FUROSEMIDE 20 MG PO TABS: 20.0000 mg | ORAL_TABLET | Freq: Every day | ORAL | 3 refills | Status: DC

## 2016-07-19 NOTE — Patient Instructions (Signed)
Thank you for coming in today. STOP HCTZ.  Start lasix daily.  Check kidney labs in 3-4 weeks.  Let me know how you are doing.   Recheck as needed.   Furosemide tablets What is this medicine? FUROSEMIDE (fyoor [place] se mide) is a diuretic. It helps you make more urine and to lose salt and excess water from your body. This medicine is used to treat high blood pressure, and edema or swelling from heart, kidney, or liver disease. This medicine may be used for other purposes; ask your health care provider or pharmacist if you have questions. COMMON BRAND NAME(S): Active-Medicated Specimen Kit, Delone, Diuscreen, Lasix, RX Specimen Collection Kit, Specimen Collection Kit, URINX Medicated Specimen Collection What should I tell my health care provider before I take this medicine? They need to know if you have any of these conditions: -abnormal blood electrolytes -diarrhea or vomiting -gout -heart disease -kidney disease, small amounts of urine, or difficulty passing urine -liver disease -thyroid disease -an unusual or allergic reaction to furosemide, sulfa drugs, other medicines, foods, dyes, or preservatives -pregnant or trying to get pregnant -breast-feeding How should I use this medicine? Take this medicine by mouth with a glass of water. Follow the directions on the prescription label. You may take this medicine with or without food. If it upsets your stomach, take it with food or milk. Do not take your medicine more often than directed. Remember that you will need to pass more urine after taking this medicine. Do not take your medicine at a time of day that will cause you problems. Do not take at bedtime. Talk to your pediatrician regarding the use of this medicine in children. While this drug may be prescribed for selected conditions, precautions do apply. Overdosage: If you think you have taken too much of this medicine contact a poison control center or emergency room at once. NOTE: This  medicine is only for you. Do not share this medicine with others. What if I miss a dose? If you miss a dose, take it as soon as you can. If it is almost time for your next dose, take only that dose. Do not take double or extra doses. What may interact with this medicine? -aspirin and aspirin-like medicines -certain antibiotics -chloral hydrate -cisplatin -cyclosporine -digoxin -diuretics -laxatives -lithium -medicines for blood pressure -medicines that relax muscles for surgery -methotrexate -NSAIDs, medicines for pain and inflammation like ibuprofen, naproxen, or indomethacin -phenytoin -steroid medicines like prednisone or cortisone -sucralfate -thyroid hormones This list may not describe all possible interactions. Give your health care provider a list of all the medicines, herbs, non-prescription drugs, or dietary supplements you use. Also tell them if you smoke, drink alcohol, or use illegal drugs. Some items may interact with your medicine. What should I watch for while using this medicine? Visit your doctor or health care professional for regular checks on your progress. Check your blood pressure regularly. Ask your doctor or health care professional what your blood pressure should be, and when you should contact him or her. If you are a diabetic, check your blood sugar as directed. You may need to be on a special diet while taking this medicine. Check with your doctor. Also, ask how many glasses of fluid you need to drink a day. You must not get dehydrated. You may get drowsy or dizzy. Do not drive, use machinery, or do anything that needs mental alertness until you know how this drug affects you. Do not stand or sit up quickly,  especially if you are an older patient. This reduces the risk of dizzy or fainting spells. Alcohol can make you more drowsy and dizzy. Avoid alcoholic drinks. This medicine can make you more sensitive to the sun. Keep out of the sun. If you cannot avoid being  in the sun, wear protective clothing and use sunscreen. Do not use sun lamps or tanning beds/booths. What side effects may I notice from receiving this medicine? Side effects that you should report to your doctor or health care professional as soon as possible: -blood in urine or stools -dry mouth -fever or chills -hearing loss or ringing in the ears -irregular heartbeat -muscle pain or weakness, cramps -skin rash -stomach upset, pain, or nausea -tingling or numbness in the hands or feet -unusually weak or tired -vomiting or diarrhea -yellowing of the eyes or skin Side effects that usually do not require medical attention (report to your doctor or health care professional if they continue or are bothersome): -headache -loss of appetite -unusual bleeding or bruising This list may not describe all possible side effects. Call your doctor for medical advice about side effects. You may report side effects to FDA at 1-800-FDA-1088. Where should I keep my medicine? Keep out of the reach of children. Store at room temperature between 15 and 30 degrees C (59 and 86 degrees F). Protect from light. Throw away any unused medicine after the expiration date. NOTE: This sheet is a summary. It may not cover all possible information. If you have questions about this medicine, talk to your doctor, pharmacist, or health care provider.  2018 Elsevier/Gold Standard (2014-03-31 13:49:50)

## 2016-07-19 NOTE — Progress Notes (Signed)
Christina Hayes is a 66 y.o. female who presents to [facility] [place] today for leg swelling. Patient presents to clinic today to follow along her leg swelling. She's had a long workup that effectively was unremarkable. She's had negative echocardiograms normal metabolic panel and a normal venous reflux study. She's had a trial of hydrochlorothiazide which has not been very effective. She notes leg swelling bilaterally worse with prolonged standing better with rest. She was reluctant to use Lasix because her husband developed acute renal injury on this medication.   Past Medical History:  Diagnosis Date  . Acid reflux   . Allergic rhinitis, cause unspecified   . Allergy   . Anemia    years ago   . Complication of anesthesia SLOW TO WAKE  . Cough   . DJD (degenerative joint disease)    spine  . Frequency of urination   . H/O hiatal hernia    small  . Head cold   . Headache(784.0)    migraqine- uses essential oils  . HYPERLIPIDEMIA, MIXED 12/03/2006   PT STATES NOT TAKING MED AS PRESCRIBED  . MIGRAINES, HX OF 12/03/2006  . Neuromuscular disorder ([place])   . Nocturia   . OSA on CPAP   . Osteopenia   . PONV (postoperative nausea and vomiting)    in past  . Shortness of breath   . Sleep apnea   . SUI (stress urinary incontinence, female)   . Urgency of urination   . VERTIGO, HX OF 12/03/2006   Past Surgical History:  Procedure Laterality Date  . COLONOSCOPY    . PUBOVAGINAL SLING  04/30/2011   Procedure: Gaynelle Arabian;  Surgeon: Bernestine Amass, MD;  Location: [facility];  Service: Urology;  Laterality: N/A;  sub urethral sling    possible ower  . RIGID BRONCHOSCOPY N/A 04/23/2013   Procedure: LASER BRONCHOSCOPY;  Surgeon: Melrose Nakayama, MD;  Location: [facility];  Service: Thoracic;  Laterality: N/A;  . [facility]  . VIDEO BRONCHOSCOPY Bilateral 03/26/2013   Procedure: VIDEO BRONCHOSCOPY WITHOUT FLUORO;  Surgeon: Tanda Rockers, MD;  Location: [facility];  Service: Cardiopulmonary;  Laterality: Bilateral;  . VIDEO BRONCHOSCOPY N/A 04/23/2013   Procedure: VIDEO BRONCHOSCOPY;  Surgeon: Melrose Nakayama, MD;  Location: [facility];  Service: Thoracic;  Laterality: N/A;  . VIDEO BRONCHOSCOPY N/A 05/28/2014   Procedure: VIDEO BRONCHOSCOPY;  Surgeon: Melrose Nakayama, MD;  Location: [facility];  Service: Thoracic;  Laterality: N/A;   Social History  Substance Use Topics  . Smoking status: Never Smoker  . Smokeless tobacco: Never Used  . Alcohol use No   family history includes Allergies in her daughter; Alzheimer's disease in her father; Breast cancer in her daughter and maternal grandmother; Cancer in her maternal grandmother and mother; Cancer (age of onset: 54) in her daughter; Colon cancer in her mother; Colon polyps in her brother; Diabetes in her father and mother; Hyperlipidemia in her father.  ROS as above:  Medications: Current Outpatient Prescriptions  Medication Sig Dispense Refill  . acetaminophen (TYLENOL ARTHRITIS PAIN) 650 MG CR tablet Take 650 mg by mouth every 8 (eight) hours as needed for pain.    Marland Kitchen ALPRAZolam (XANAX) 0.5 MG tablet     . Calcium Carb-Cholecalciferol (SM CALCIUM/VITAMIN D) 600-800 MG-UNIT TABS Take by mouth.    . cetirizine (ZYRTEC) 10 MG tablet  Take 1 tablet (10 mg total) by mouth daily. 90 tablet 3  . Cholecalciferol (VITAMIN D PO) Take 800 Units by mouth daily.    . pantoprazole (PROTONIX) 40 MG tablet Take 1 tablet (40 mg total) by mouth daily. 90 tablet 3  . simvastatin (ZOCOR) 40 MG tablet Take 1 tablet by mouth  daily 90 tablet 3  . zolpidem (AMBIEN) 5 MG tablet Take 1 tablet (5 mg total) by mouth at bedtime as needed for sleep. 30 tablet 1  . furosemide (LASIX) 20 MG tablet Take 1 tablet (20 mg total) by mouth daily. 30 tablet 3   No  current facility-administered medications for this visit.    Allergies  Allergen Reactions  . Codeine Itching    *Can take Hydrocodone*  . Tramadol     headache  . Protriptyline Palpitations  . Vivactil [Protriptyline Hcl] Palpitations    Health Maintenance Health Maintenance  Topic Date Due  . COLONOSCOPY  06/08/2013  . INFLUENZA VACCINE  08/22/2016  . PNA vac Low Risk Adult (2 of 2 - PPSV23) 03/25/2017  . MAMMOGRAM  03/28/2018  . TETANUS/TDAP  01/27/2024  . DEXA SCAN  Completed  . Hepatitis C Screening  Completed     Exam:  BP 111/72   Pulse 84   Ht 5\' 2"  (1.575 m)   Wt 183 lb (83 kg)   BMI 33.47 kg/m  Gen: Well NAD HEENT: EOMI,  MMM Lungs: Normal work of breathing. CTABL Heart: RRR no MRG Abd: NABS, Soft. Nondistended, Nontender Exts: Brisk capillary refill, warm and well perfused. Trace edema bilateral lower extremities   No results found for this or any previous visit (from the past 72 hour(s)). No results found.    Assessment and Plan: 66 y.o. female with dependent edema I think is the fundamental underlying cause. Plan to stop hydrochlorothiazide and do a short trial of Lasix. Recheck metabolic panel a few weeks on Lasix. Also recommend compression stockings.   Orders Placed This Encounter  Procedures  . BASIC METABOLIC PANEL WITH GFR   Meds ordered this encounter  Medications  . furosemide (LASIX) 20 MG tablet    Sig: Take 1 tablet (20 mg total) by mouth daily.    Dispense:  30 tablet    Refill:  3     Discussed warning signs or symptoms. Please see discharge instructions. Patient expresses understanding.

## 2016-08-14 ENCOUNTER — Encounter: Payer: Self-pay | Admitting: Family Medicine

## 2016-08-30 DIAGNOSIS — M7989 Other specified soft tissue disorders: Secondary | ICD-10-CM | POA: Diagnosis not present

## 2016-08-30 LAB — BASIC METABOLIC PANEL WITH GFR
BUN: 13 mg/dL (ref 7–25)
CALCIUM: 9.6 mg/dL (ref 8.6–10.4)
CO2: 28 mmol/L (ref 20–32)
CREATININE: 0.88 mg/dL (ref 0.50–0.99)
Chloride: 102 mmol/L (ref 98–110)
GFR, Est African American: 79 mL/min (ref >=60)
GFR, Est Non African American: 68 mL/min (ref >=60)
Glucose, Bld: 98 mg/dL (ref 65–99)
Potassium: 3.9 mmol/L (ref 3.5–5.3)
SODIUM: 139 mmol/L (ref 135–146)

## 2016-10-09 ENCOUNTER — Encounter: Payer: Self-pay | Admitting: Family Medicine

## 2016-10-09 MED ORDER — ZOLPIDEM TARTRATE 5 MG PO TABS: 5.0000 mg | ORAL_TABLET | Freq: Every evening | ORAL | 1 refills | Status: DC | PRN

## 2016-10-16 ENCOUNTER — Encounter: Payer: Self-pay | Admitting: Family Medicine

## 2016-10-16 MED ORDER — FUROSEMIDE 20 MG PO TABS: 20.0000 mg | ORAL_TABLET | Freq: Every day | ORAL | 1 refills | Status: DC

## 2016-10-16 MED ORDER — ZOLPIDEM TARTRATE 5 MG PO TABS: 5.0000 mg | ORAL_TABLET | Freq: Every evening | ORAL | 1 refills | Status: DC | PRN

## 2016-10-24 ENCOUNTER — Ambulatory Visit: Payer: Medicare Other

## 2017-01-21 ENCOUNTER — Other Ambulatory Visit: Payer: Self-pay | Admitting: Family Medicine

## 2017-01-21 DIAGNOSIS — K219 Gastro-esophageal reflux disease without esophagitis: Secondary | ICD-10-CM

## 2017-01-21 DIAGNOSIS — E782 Mixed hyperlipidemia: Secondary | ICD-10-CM

## 2017-02-05 ENCOUNTER — Other Ambulatory Visit: Payer: Self-pay | Admitting: Family Medicine

## 2017-02-05 DIAGNOSIS — Z1231 Encounter for screening mammogram for malignant neoplasm of breast: Secondary | ICD-10-CM

## 2017-03-07 ENCOUNTER — Other Ambulatory Visit: Payer: Self-pay | Admitting: Family Medicine

## 2017-03-28 ENCOUNTER — Ambulatory Visit
Admission: RE | Admit: 2017-03-28 | Discharge: 2017-03-28 | Disposition: A | Discharge status: Home / Self Care | Payer: Medicare Other | Source: Ambulatory Visit | Attending: Family Medicine | Admitting: Family Medicine

## 2017-03-28 DIAGNOSIS — Z1231 Encounter for screening mammogram for malignant neoplasm of breast: Secondary | ICD-10-CM

## 2017-04-04 ENCOUNTER — Encounter: Payer: Self-pay | Admitting: Family Medicine

## 2017-04-05 ENCOUNTER — Other Ambulatory Visit: Payer: Self-pay | Admitting: Osteopathic Medicine

## 2017-04-05 MED ORDER — TRAZODONE HCL 100 MG PO TABS: 50.0000 mg | ORAL_TABLET | Freq: Every evening | ORAL | 0 refills | Status: DC | PRN

## 2017-04-23 ENCOUNTER — Telehealth: Payer: Self-pay | Admitting: Family Medicine

## 2017-04-23 NOTE — Telephone Encounter (Signed)
Responding to [employer] pt did not yet read from mid March:  Yes you are due to PNA 23 vaccine every 5 years as things currently stand. You can schedule a nurse visit for that in the near future.   Ellard Artis

## 2017-04-23 NOTE — Telephone Encounter (Signed)
Patient informed. Rhonda Cunningham,CMA  

## 2017-05-14 ENCOUNTER — Other Ambulatory Visit: Payer: Self-pay | Admitting: Thoracic Surgery (Cardiothoracic Vascular Surgery)

## 2017-05-14 DIAGNOSIS — C3490 Malignant neoplasm of unspecified part of unspecified bronchus or lung: Secondary | ICD-10-CM

## 2017-06-18 ENCOUNTER — Ambulatory Visit: Payer: Medicare Other | Admitting: Thoracic Surgery (Cardiothoracic Vascular Surgery)

## 2017-06-18 ENCOUNTER — Other Ambulatory Visit: Payer: Medicare Other

## 2017-06-19 ENCOUNTER — Encounter: Payer: Self-pay | Admitting: Family Medicine

## 2017-06-20 MED ORDER — ZOLPIDEM TARTRATE 5 MG PO TABS: 5.0000 mg | ORAL_TABLET | Freq: Every evening | ORAL | 1 refills | Status: DC | PRN

## 2017-07-02 ENCOUNTER — Ambulatory Visit
Admission: RE | Admit: 2017-07-02 | Discharge: 2017-07-02 | Disposition: A | Discharge status: Home / Self Care | Payer: Medicare Other | Source: Ambulatory Visit | Attending: Thoracic Surgery (Cardiothoracic Vascular Surgery) | Admitting: Thoracic Surgery (Cardiothoracic Vascular Surgery)

## 2017-07-02 ENCOUNTER — Other Ambulatory Visit: Payer: Self-pay

## 2017-07-02 ENCOUNTER — Encounter: Payer: Self-pay | Admitting: Thoracic Surgery (Cardiothoracic Vascular Surgery)

## 2017-07-02 ENCOUNTER — Ambulatory Visit: Payer: Medicare Other | Admitting: Thoracic Surgery (Cardiothoracic Vascular Surgery)

## 2017-07-02 VITALS — BP 100/60 | HR 84 | Resp 16 | Ht 62.0 in | Wt 180.0 lb

## 2017-07-02 DIAGNOSIS — Z09 Encounter for follow-up examination after completed treatment for conditions other than malignant neoplasm: Secondary | ICD-10-CM | POA: Diagnosis not present

## 2017-07-02 DIAGNOSIS — C3490 Malignant neoplasm of unspecified part of unspecified bronchus or lung: Secondary | ICD-10-CM

## 2017-07-02 DIAGNOSIS — D3A00 Benign carcinoid tumor of unspecified site: Secondary | ICD-10-CM | POA: Diagnosis not present

## 2017-07-02 NOTE — Progress Notes (Signed)
[address]       [place] [address]             [phone]      HPI: Mrs. 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  She initially presented with a recurrent wheezing and respiratory infections.  I did a surveillance bronchoscopy and CT at one year.  There was no evidence of recurrence.  We have been following her with CT scans since then.  I last saw her in May 2018.  She was doing well at that time with no evidence of recurrent disease.  In the interim since her last visit she is continued to do well from a respiratory standpoint.  She has not had any unusual coughing, wheezing, or shortness of breath.  Her weight has been stable.  She has not had any other medical issues.  She has been under a great deal of stress as her husband recently underwent a liver transplant.  Fortunately he has done well with that.  Past Medical History:  Diagnosis Date  . Acid reflux   . Allergic rhinitis, cause unspecified   . Allergy   . Anemia    years ago   . Complication of anesthesia SLOW TO WAKE  . Cough   . DJD (degenerative joint disease)    spine  . Frequency of urination   . H/O hiatal hernia    small  . Head cold   . Headache(784.0)    migraqine- uses essential oils  . HYPERLIPIDEMIA, MIXED 12/03/2006   PT STATES NOT TAKING MED AS PRESCRIBED  . MIGRAINES, HX OF 12/03/2006  . Neuromuscular disorder ([place])   . Nocturia   . OSA on CPAP   . Osteopenia   . PONV (postoperative nausea and vomiting)    in past  . Shortness of breath   . Sleep apnea   . SUI (stress urinary incontinence, female)   . Urgency of urination   . VERTIGO, HX OF 12/03/2006    Current Outpatient Medications  Medication Sig Dispense Refill  . acetaminophen (TYLENOL ARTHRITIS PAIN) 650 MG CR tablet Take 650 mg by mouth every 8  (eight) hours as needed for pain.    . Calcium Carb-Cholecalciferol (SM CALCIUM/VITAMIN D) 600-800 MG-UNIT TABS Take by mouth.    . cetirizine (ZYRTEC) 10 MG tablet Take 1 tablet (10 mg total) by mouth daily. 90 tablet 3  . Cholecalciferol (VITAMIN D PO) Take 800 Units by mouth daily.    . furosemide (LASIX) 20 MG tablet TAKE 1 TABLET BY MOUTH  DAILY 90 tablet 1  . pantoprazole (PROTONIX) 40 MG tablet TAKE 1 TABLET BY MOUTH  DAILY 90 tablet 3  . simvastatin (ZOCOR) 40 MG tablet TAKE 1 TABLET BY MOUTH  DAILY 90 tablet 3  . zolpidem (AMBIEN) 5 MG tablet Take 1 tablet (5 mg total) by mouth at bedtime as needed for sleep. 90 tablet 1   No current facility-administered medications for this visit.     Physical Exam BP 100/60 (BP Location: Right Arm, Patient Position: Sitting, Cuff Size: Large)   Pulse 84   Resp 16   Ht 5\' 2"  (1.575 m)   Wt 180 lb (81.6 kg)   SpO2 94% Comment: ON RA  BMI 32.19 kg/m  67 year old woman in no acute distress Alert and  oriented x3 with no focal deficits No cervical or supra clavicular adenopathy Cardiac regular rate and rhythm normal S1 and S2 Lungs clear with equal breath sounds bilaterally, no stridor or wheezing  Diagnostic Tests: CT CHEST WITHOUT CONTRAST  TECHNIQUE: Multidetector CT imaging of the chest was performed following the standard protocol without IV contrast.  COMPARISON:  06/19/2016 chest CT.  FINDINGS: Cardiovascular: Normal heart size. No significant pericardial effusion/thickening. Three-vessel coronary atherosclerosis. Atherosclerotic nonaneurysmal thoracic aorta. Normal caliber pulmonary arteries.  Mediastinum/Nodes: No discrete thyroid nodules. Unremarkable esophagus. No pathologically enlarged axillary, mediastinal or hilar lymph nodes, noting limited sensitivity for the detection of hilar adenopathy on this noncontrast study.  Lungs/Pleura: No pneumothorax. No pleural effusion. Solid 4 mm pulmonary nodules in the  medial right lower lobe (series 8/image 90) and left lower lobe (series 8/image 82), both stable since at least 06/07/2015 chest CT, considered benign. No acute consolidative airspace disease, lung masses or new significant pulmonary nodules.  Upper abdomen: Small hiatal hernia.  Musculoskeletal: No aggressive appearing focal osseous lesions. Mild thoracic spondylosis.  IMPRESSION: 1. No evidence of recurrent tumor or metastatic disease in the chest. 2. Three-vessel coronary atherosclerosis. 3. Small hiatal hernia.  Aortic Atherosclerosis (ICD10-I70.0).   Electronically Signed   By: Christina Hayes M.D.   On: 07/02/2017 11:52 I personally reviewed the CT images and concur with the findings noted above  Impression: Mrs. Christina Hayes is a 67 year old woman who originally presented with cough wheezing and recurrent respiratory infections.  She was diagnosed with a left mainstem carcinoid tumor.  I did an endobronchial resection and laser ablation 4 years ago. She has no evidence of recurrent disease. As expected her wheezing resolved after the resection and she has not had issues with recurrent infections since then.   Plan: Return in 1 year for 5-year follow-up visit  Melrose Nakayama, MD [facility] [phone]

## 2017-09-05 ENCOUNTER — Other Ambulatory Visit: Payer: Self-pay | Admitting: Family Medicine

## 2017-09-20 ENCOUNTER — Encounter: Payer: Self-pay | Admitting: Family Medicine

## 2017-10-14 ENCOUNTER — Encounter: Payer: Self-pay | Admitting: Family Medicine

## 2017-10-14 ENCOUNTER — Ambulatory Visit (INDEPENDENT_AMBULATORY_CARE_PROVIDER_SITE_OTHER): Payer: Medicare Other

## 2017-10-14 ENCOUNTER — Ambulatory Visit (INDEPENDENT_AMBULATORY_CARE_PROVIDER_SITE_OTHER): Payer: Medicare Other | Admitting: Family Medicine

## 2017-10-14 VITALS — BP 126/61 | HR 69 | Ht 62.0 in | Wt 188.0 lb

## 2017-10-14 DIAGNOSIS — E782 Mixed hyperlipidemia: Secondary | ICD-10-CM | POA: Diagnosis not present

## 2017-10-14 DIAGNOSIS — Z23 Encounter for immunization: Secondary | ICD-10-CM | POA: Diagnosis not present

## 2017-10-14 DIAGNOSIS — M19041 Primary osteoarthritis, right hand: Secondary | ICD-10-CM | POA: Diagnosis not present

## 2017-10-14 DIAGNOSIS — M79641 Pain in right hand: Secondary | ICD-10-CM

## 2017-10-14 DIAGNOSIS — K529 Noninfective gastroenteritis and colitis, unspecified: Secondary | ICD-10-CM

## 2017-10-14 DIAGNOSIS — Z1211 Encounter for screening for malignant neoplasm of colon: Secondary | ICD-10-CM

## 2017-10-14 DIAGNOSIS — G4720 Circadian rhythm sleep disorder, unspecified type: Secondary | ICD-10-CM

## 2017-10-14 MED ORDER — DICLOFENAC SODIUM 1 % TD GEL: 2.0000 g | Freq: Four times a day (QID) | TRANSDERMAL | 11 refills | Status: DC

## 2017-10-14 MED ORDER — SUVOREXANT 10 MG PO TABS: 1.0000 | ORAL_TABLET | Freq: Every day | ORAL | 1 refills | Status: DC

## 2017-10-14 NOTE — Progress Notes (Signed)
Christina Hayes is a 67 y.o. female who presents to [place]: [place] today for right thumb pain insomnia and screening and lab recheck.  Emberlee notes pain in her right hand and thumb ongoing now for several months.  She denies any recent injury.  She notes pain in the base of her thumb extending into her wrist a bit.  Pain is worse with activity including driving.  She is tried treatment with some home exercises as well as a compressive hand sleeve which helps a bit.  She denies fevers or chills nausea vomiting or diarrhea.  Additionally she has a history of insomnia.  She is been taking Ambien for some time but would like to try switching to a safer medication.  She has had trials of trazodone which did not help as well as over-the-counter medications like Benadryl which did not help and melatonin which did not help.  She is even had trials of Klonopin which were not helpful either.  Lab recheck: Patient has been not seen for some time.  Her husband had a liver transplant a few months ago and is currently doing well.  She like to return focus to her health.  She has history of mild hyperlipidemia and history of acid reflux.  She notes that she is overdue for some basic labs including health maintenance items including colon cancer screening.   ROS as above:  Exam:  BP 126/61   Pulse 69   Ht 5\' 2"  (1.575 m)   Wt 188 lb (85.3 kg)   BMI 34.39 kg/m  Wt Readings from Last 5 Encounters:  10/14/17 188 lb (85.3 kg)  07/02/17 180 lb (81.6 kg)  07/19/16 183 lb (83 kg)  06/19/16 185 lb (83.9 kg)  06/11/16 187 lb (84.8 kg)    Gen: Well NAD HEENT: EOMI,  MMM Lungs: Normal work of breathing. CTABL Heart: RRR no MRG Abd: NABS, Soft. Nondistended, Nontender Exts: Brisk capillary refill, warm and well perfused.  Right hand: Normal-appearing with no obvious deformities. Normal hand  and wrist motion.  Mildly tender to palpation at first [facility].  Pulses capillary refill and sensation are intact distally.  Negative Tinel's at carpal tunnel.   Lab and Radiology Results No results found for this or any previous visit (from the past 72 hour(s)). Dg Hand Complete Right  Result Date: 10/14/2017 CLINICAL DATA:  67 year old female with a history of right thumb and hand pain EXAM: RIGHT HAND - COMPLETE 3+ VIEW COMPARISON:  None. FINDINGS: No acute displaced fracture identified. Degenerative changes of the interphalangeal joints, predominantly distal IP. No subluxation/dislocation. No marginal erosions. No focal osteopenia. No radiopaque foreign body. Minimal degenerative changes of the first carpometacarpal joint. IMPRESSION: Negative for acute bony abnormality. Osteoarthritis Electronically Signed   By: Corrie Mckusick D.O.   On: 10/14/2017 13:51  I personally (independently) visualized and performed the interpretation of the images attached in this note.     Assessment and Plan: 67 y.o. female with  Right hand pain: Likely first [place] DJD.  Discussed options.  Plan for trial of diclofenac gel and home exercise program.  If not improving next step would be either injection or hand therapy.  Insomnia: Would like to switch away from Ambien.  Trial of Bell Sombra.  Patient is already tried and failed trazodone, diphenhydramine, Klonopin, and Ambien.  Lab recheck: Check basic labs listed below. This will help follow up HLD, and diarrhea.   Additionally  will refer back to gastroenterology for colon cancer screening.  Flu vaccine given today prior to discharge.   Orders Placed This Encounter  Procedures  . DG Hand Complete Right    Standing Status:   Future    Number of Occurrences:   1    Standing Expiration Date:   12/15/2018    Order Specific Question:   Reason for Exam (SYMPTOM  OR DIAGNOSIS REQUIRED)    Answer:   eval pain thumb and hand r    Order Specific Question:    Preferred imaging location?    Answer:   [facility]    Order Specific Question:   Radiology Contrast Protocol - do NOT remove file path    Answer:   [email or web address]  . Flu Vaccine QUAD 36+ mos IM  . CBC  . COMPLETE METABOLIC PANEL WITH GFR  . Lipid Panel w/reflex Direct LDL  . Ambulatory referral to Gastroenterology    Referral Priority:   Routine    Referral Type:   Consultation    Referral Reason:   Specialty Services Required    Number of Visits Requested:   1   Meds ordered this encounter  Medications  . Suvorexant (BELSOMRA) 10 MG TABS    Sig: Take 1 tablet by mouth at bedtime.    Dispense:  30 tablet    Refill:  1    Have tried and failed Ambien, Trazodone, Klonopin, and Benadryl  . diclofenac sodium (VOLTAREN) 1 % GEL    Sig: Apply 2 g topically 4 (four) times daily. To affected joint.    Dispense:  100 g    Refill:  11     Historical information moved to improve visibility of documentation.  Past Medical History:  Diagnosis Date  . Acid reflux   . Allergic rhinitis, cause unspecified   . Allergy   . Anemia    years ago   . Complication of anesthesia SLOW TO WAKE  . Cough   . DJD (degenerative joint disease)    spine  . Frequency of urination   . H/O hiatal hernia    small  . Head cold   . Headache(784.0)    migraqine- uses essential oils  . HYPERLIPIDEMIA, MIXED 12/03/2006   PT STATES NOT TAKING MED AS PRESCRIBED  . MIGRAINES, HX OF 12/03/2006  . Neuromuscular disorder ([place])   . Nocturia   . OSA on CPAP   . Osteopenia   . PONV (postoperative nausea and vomiting)    in past  . Shortness of breath   . Sleep apnea   . SUI (stress urinary incontinence, female)   . Urgency of urination   . VERTIGO, HX OF 12/03/2006   Past Surgical History:  Procedure Laterality Date  . COLONOSCOPY    . PUBOVAGINAL SLING  04/30/2011   Procedure: Gaynelle Arabian;  Surgeon: Bernestine Amass, MD;  Location[facility];  Service: Urology;  Laterality: N/A;  sub urethral sling    possible ower  . RIGID BRONCHOSCOPY N/A 04/23/2013   Procedure: LASER BRONCHOSCOPY;  Surgeon: Melrose Nakayama, MD;  Location: [place];  Service: Thoracic;  Laterality: N/A;  . [place]  . VIDEO BRONCHOSCOPY Bilateral 03/26/2013   Procedure: VIDEO BRONCHOSCOPY WITHOUT FLUORO;  Surgeon: Tanda Rockers, MD;  Location: [facility];  Service: Cardiopulmonary;  Laterality: Bilateral;  . VIDEO BRONCHOSCOPY N/A 04/23/2013   Procedure: VIDEO BRONCHOSCOPY;  Surgeon: Revonda Standard  Roxan Hockey, MD;  Location: [place];  Service: Thoracic;  Laterality: N/A;  . VIDEO BRONCHOSCOPY N/A 05/28/2014   Procedure: VIDEO BRONCHOSCOPY;  Surgeon: Melrose Nakayama, MD;  Location: [place];  Service: Thoracic;  Laterality: N/A;   Social History   Tobacco Use  . Smoking status: Never Smoker  . Smokeless tobacco: Never Used  Substance Use Topics  . Alcohol use: No   family history includes Allergies in her daughter; Alzheimer's disease in her father; Breast cancer in her daughter and maternal grandmother; Cancer in her maternal grandmother and mother; Cancer (age of onset: 91) in her daughter; Colon cancer in her mother; Colon polyps in her brother; Diabetes in her father and mother; Hyperlipidemia in her father.  Medications: Current Outpatient Medications  Medication Sig Dispense Refill  . acetaminophen (TYLENOL ARTHRITIS PAIN) 650 MG CR tablet Take 650 mg by mouth every 8 (eight) hours as needed for pain.    . Calcium Carb-Cholecalciferol (SM CALCIUM/VITAMIN D) 600-800 MG-UNIT TABS Take by mouth.    . cetirizine (ZYRTEC) 10 MG tablet Take 1 tablet (10 mg total) by mouth daily. 90 tablet 3  . Cholecalciferol (VITAMIN D PO) Take 800 Units by mouth daily.    . furosemide (LASIX) 20 MG tablet TAKE 1 TABLET BY MOUTH  DAILY 90 tablet 1  . pantoprazole (PROTONIX) 40 MG tablet TAKE 1  TABLET BY MOUTH  DAILY 90 tablet 3  . simvastatin (ZOCOR) 40 MG tablet TAKE 1 TABLET BY MOUTH  DAILY 90 tablet 3  . zolpidem (AMBIEN) 5 MG tablet Take 1 tablet (5 mg total) by mouth at bedtime as needed for sleep. 90 tablet 1  . diclofenac sodium (VOLTAREN) 1 % GEL Apply 2 g topically 4 (four) times daily. To affected joint. 100 g 11  . Suvorexant (BELSOMRA) 10 MG TABS Take 1 tablet by mouth at bedtime. 30 tablet 1   No current facility-administered medications for this visit.    Allergies  Allergen Reactions  . Codeine Itching    *Can take Hydrocodone*  . Tramadol     headache  . Protriptyline Palpitations  . Vivactil [Protriptyline Hcl] Palpitations     Discussed warning signs or symptoms. Please see discharge instructions. Patient expresses understanding.

## 2017-10-14 NOTE — Patient Instructions (Addendum)
Thank you for coming in today. Work on hand therapy at home.  Use modeling clay.  Apply the diclofenac gel.  Recheck if not impriving.   You should hear about colonoscopy.   Try belsomra.  Stop Ambien.   Recheck in 6-8 weeks.   Suvorexant oral tablets What is this medicine? SUVOREXANT (su-vor-EX-ant) is used to treat insomnia. This medicine helps you to fall asleep and sleep through the night. This medicine may be used for other purposes; ask your health care provider or pharmacist if you have questions. COMMON BRAND NAME(S): Belsomra What should I tell my health care provider before I take this medicine? They need to know if you have any of these conditions: -depression -history of a drug or alcohol abuse problem -history of daytime sleepiness -history of sudden onset of muscle weakness (cataplexy) -liver disease -lung or breathing disease -narcolepsy -suicidal thoughts, plans, or attempt; a previous suicide attempt by you or a family member -an unusual or allergic reaction to suvorexant, other medicines, foods, dyes, or preservatives -pregnant or trying to get pregnant -breast-feeding How should I use this medicine? Take this medicine by mouth within 30 minutes of going to bed. Do not take it unless you are able to stay in bed a full night before you must be active again. Follow the directions on the prescription label. For best results, it is better to take this medicine on an empty stomach. Do not take your medicine more often than directed. Do not stop taking this medicine on your own. Always follow your doctor or health care professional's advice. A special MedGuide will be given to you by the pharmacist with each prescription and refill. Be sure to read this information carefully each time. Talk to your pediatrician regarding the use of this medicine in children. Special care may be needed. Overdosage: If you think you have taken too much of this medicine contact a poison  control center or emergency room at once. NOTE: This medicine is only for you. Do not share this medicine with others. What if I miss a dose? This medicine should only be taken immediately before going to sleep. Do not take double or extra doses. What may interact with this medicine? -alcohol -antiviral medicines for HIV or AIDS -aprepitant -carbamazepine -certain antibiotics like ciprofloxacin, clarithromycin, erythromycin, telithromycin -certain medicines for depression or psychotic disturbances -certain medicines for fungal infections like ketoconazole, posaconazole, fluconazole, or itraconazole -conivaptan -digoxin -diltiazem -grapefruit juice -imatinib -medicines for anxiety or sleep -phenytoin -rifampin -verapamil This list may not describe all possible interactions. Give your health care provider a list of all the medicines, herbs, non-prescription drugs, or dietary supplements you use. Also tell them if you smoke, drink alcohol, or use illegal drugs. Some items may interact with your medicine. What should I watch for while using this medicine? Visit your doctor or health care professional for regular checks on your progress. Keep a regular sleep schedule by going to bed at about the same time each night. Avoid caffeine-containing drinks in the evening hours. When sleep medicines are used every night for more than a few weeks, they may stop working. Do not increase the dose on your own. Talk to your doctor if your insomnia worsens or is not better within 7 to 10 days. After taking this medicine for sleep, you may get up out of bed while not being fully awake and do an activity that you do not know you are doing. The next morning, you may have no memory of  the event. Activities such as driving a car ("sleep-driving"), making and eating food, talking on the phone, sexual activity, and sleep-walking have been reported. Call your doctor right away if you find out you have done any of these  activities. Do not take this medicine if you have used alcohol that evening or before bed or taken another medicine for sleep, since your risk of doing these sleep-related activities will be increased. Do not take this medicine unless you are able to stay in bed for a full night (7 to 8 hours) and do not drive or perform other activities requiring full alertness within 8 hours of a dose. Do not drive, use machinery, or do anything that needs mental alertness the day after you take the 20 mg dose of this medicine. The use of lower doses (10 mg) also has the potential to cause driving impairment the next day. You may have a decrease in mental alertness the day after use, even if you feel that you are fully awake. Tell your doctor if you will need to perform activities requiring full alertness, such as driving, the next day. Do not stand or sit up quickly after taking this medicine, especially if you are an older patient. This reduces the risk of dizzy or fainting spells. If you or your family notice any changes in your behavior, such as new or worsening depression, thoughts of harming yourself, anxiety, other unusual or disturbing thoughts, or memory loss, call your doctor right away. What side effects may I notice from receiving this medicine? Side effects that you should report to your doctor or health care professional as soon as possible: -allergic reactions like skin rash, itching or hives, swelling of the face, lips, or tongue -confusion -depressed mood -feeling faint or lightheaded, falls -hallucinations -inability to move or speak for up to several minutes while you are going to sleep or waking up -memory loss -periods of leg weakness lasting from seconds to a few minutes -problems with balance, speaking, walking -restlessness, excitability, or feelings of agitation -unusual activities while asleep like driving, eating, making phone calls Side effects that usually do not require medical  attention (report to your doctor or health care professional if they continue or are bothersome): -abnormal dreams -daytime drowsiness -diarrhea -dizziness -headache This list may not describe all possible side effects. Call your doctor for medical advice about side effects. You may report side effects to FDA at 1-800-FDA-1088. Where should I keep my medicine? Keep out of the reach of children. This medicine can be abused. Keep your medicine in a safe place to protect it from theft. Do not share this medicine with anyone. Selling or giving away this medicine is dangerous and against the law. Store at room temperature between 15 and 30 degrees C (59 and 86 degrees F). Throw away any unused medicine after the expiration date. NOTE: This sheet is a summary. It may not cover all possible information. If you have questions about this medicine, talk to your doctor, pharmacist, or health care provider.  2018 Elsevier/Gold Standard (2015-02-10 11:54:49)

## 2017-10-16 DIAGNOSIS — M79641 Pain in right hand: Secondary | ICD-10-CM | POA: Diagnosis not present

## 2017-10-16 DIAGNOSIS — G4720 Circadian rhythm sleep disorder, unspecified type: Secondary | ICD-10-CM | POA: Diagnosis not present

## 2017-10-16 DIAGNOSIS — K529 Noninfective gastroenteritis and colitis, unspecified: Secondary | ICD-10-CM | POA: Diagnosis not present

## 2017-10-16 DIAGNOSIS — Z23 Encounter for immunization: Secondary | ICD-10-CM | POA: Diagnosis not present

## 2017-10-16 DIAGNOSIS — E782 Mixed hyperlipidemia: Secondary | ICD-10-CM | POA: Diagnosis not present

## 2017-10-17 LAB — LIPID PANEL W/REFLEX DIRECT LDL
CHOLESTEROL: 210 mg/dL — AB (ref <200)
HDL: 69 mg/dL (ref >50)
LDL CHOLESTEROL (CALC): 112 mg/dL — AB
NON-HDL CHOLESTEROL (CALC): 141 mg/dL — AB (ref <130)
Total CHOL/HDL Ratio: 3 (calc) (ref <5.0)
Triglycerides: 169 mg/dL — ABNORMAL HIGH (ref <150)

## 2017-10-17 LAB — CBC
HCT: 37.1 % (ref 35.0–45.0)
Hemoglobin: 12.6 g/dL (ref 11.7–15.5)
MCH: 27.9 pg (ref 27.0–33.0)
MCHC: 34 g/dL (ref 32.0–36.0)
MCV: 82.3 fL (ref 80.0–100.0)
MPV: 9.6 fL (ref 7.5–12.5)
PLATELETS: 376 10*3/uL (ref 140–400)
RBC: 4.51 10*6/uL (ref 3.80–5.10)
RDW: 13 % (ref 11.0–15.0)
WBC: 6.7 10*3/uL (ref 3.8–10.8)

## 2017-10-17 LAB — COMPLETE METABOLIC PANEL WITH GFR
AG Ratio: 1.6 (calc) (ref 1.0–2.5)
ALKALINE PHOSPHATASE (APISO): 121 U/L (ref 33–130)
ALT: 18 U/L (ref 6–29)
AST: 25 U/L (ref 10–35)
Albumin: 4.4 g/dL (ref 3.6–5.1)
BILIRUBIN TOTAL: 0.4 mg/dL (ref 0.2–1.2)
BUN: 14 mg/dL (ref 7–25)
CHLORIDE: 104 mmol/L (ref 98–110)
CO2: 27 mmol/L (ref 20–32)
Calcium: 10.1 mg/dL (ref 8.6–10.4)
Creat: 0.96 mg/dL (ref 0.50–0.99)
GFR, Est African American: 70 mL/min/{1.73_m2} (ref >=60)
GFR, Est Non African American: 61 mL/min/{1.73_m2} (ref >=60)
GLUCOSE: 119 mg/dL (ref 65–139)
Globulin: 2.8 g/dL (calc) (ref 1.9–3.7)
Potassium: 4 mmol/L (ref 3.5–5.3)
Sodium: 139 mmol/L (ref 135–146)
Total Protein: 7.2 g/dL (ref 6.1–8.1)

## 2017-10-17 MED ORDER — ATORVASTATIN CALCIUM 40 MG PO TABS: 40.0000 mg | ORAL_TABLET | Freq: Every day | ORAL | 3 refills | Status: DC

## 2017-10-17 MED ORDER — GABAPENTIN 300 MG PO CAPS: 300.0000 mg | ORAL_CAPSULE | Freq: Every day | ORAL | 3 refills | Status: DC

## 2017-10-17 NOTE — Addendum Note (Signed)
Addended by: Gregor Hams on: 10/17/2017 07:45 AM   Modules accepted: Orders

## 2017-10-18 ENCOUNTER — Encounter: Payer: Self-pay | Admitting: Family Medicine

## 2017-11-26 ENCOUNTER — Encounter: Payer: Self-pay | Admitting: Family Medicine

## 2017-11-26 ENCOUNTER — Ambulatory Visit (INDEPENDENT_AMBULATORY_CARE_PROVIDER_SITE_OTHER): Payer: Medicare Other | Admitting: Family Medicine

## 2017-11-26 VITALS — BP 122/48 | HR 57 | Ht 62.0 in | Wt 190.0 lb

## 2017-11-26 DIAGNOSIS — E782 Mixed hyperlipidemia: Secondary | ICD-10-CM | POA: Diagnosis not present

## 2017-11-26 DIAGNOSIS — Z0000 Encounter for general adult medical examination without abnormal findings: Secondary | ICD-10-CM

## 2017-11-26 DIAGNOSIS — Z6834 Body mass index (BMI) 34.0-34.9, adult: Secondary | ICD-10-CM | POA: Diagnosis not present

## 2017-11-26 MED ORDER — ZOSTER VAC RECOMB ADJUVANTED 50 MCG/0.5ML IM SUSR: 0.5000 mL | Freq: Once | INTRAMUSCULAR | 1 refills | Status: AC

## 2017-11-26 NOTE — Patient Instructions (Addendum)
Thank you for coming in today. Keep an eye on your anxiety and stress.  If worsening we can do medicine and help with that.   How to manage someone who is anxious.    Schedule a nurse medicare well exam.

## 2017-11-26 NOTE — Progress Notes (Signed)
Christina Hayes is a 67 y.o. female who presents to [place]: [place] today for well adult visit.  Christina "Karna Christmas" is doing reasonably well.  She notes stress at home caring for her husband who had a liver transplant a few months ago and caring for her mother as well. She notes that she is getting some exercise but not a lot of exercise regularly.  She also is not eating a careful diet.  She takes medications listed below and is reasonably happy with how things are going with her life.  ROS as above:  Past Medical History:  Diagnosis Date  . Acid reflux   . Allergic rhinitis, cause unspecified   . Allergy   . Anemia    years ago   . Complication of anesthesia SLOW TO WAKE  . Cough   . DJD (degenerative joint disease)    spine  . Frequency of urination   . H/O hiatal hernia    small  . Head cold   . Headache(784.0)    migraqine- uses essential oils  . HYPERLIPIDEMIA, MIXED 12/03/2006   PT STATES NOT TAKING MED AS PRESCRIBED  . MIGRAINES, HX OF 12/03/2006  . Neuromuscular disorder ([place])   . Nocturia   . OSA on CPAP   . Osteopenia   . PONV (postoperative nausea and vomiting)    in past  . Shortness of breath   . Sleep apnea   . SUI (stress urinary incontinence, female)   . Urgency of urination   . VERTIGO, HX OF 12/03/2006   Past Surgical History:  Procedure Laterality Date  . COLONOSCOPY    . PUBOVAGINAL SLING  04/30/2011   Procedure: Gaynelle Arabian;  Surgeon: Bernestine Amass, MD;  Location: [facility];  Service: Urology;  Laterality: N/A;  sub urethral sling    possible ower  . RIGID BRONCHOSCOPY N/A 04/23/2013   Procedure: LASER BRONCHOSCOPY;  Surgeon: Melrose Nakayama, MD;  Location: [place];  Service: Thoracic;  Laterality: N/A;  . [place]  . VIDEO BRONCHOSCOPY Bilateral  03/26/2013   Procedure: VIDEO BRONCHOSCOPY WITHOUT FLUORO;  Surgeon: Tanda Rockers, MD;  Location[facility];  Service: Cardiopulmonary;  Laterality: Bilateral;  . VIDEO BRONCHOSCOPY N/A 04/23/2013   Procedure: VIDEO BRONCHOSCOPY;  Surgeon: Melrose Nakayama, MD;  Location: [place];  Service: Thoracic;  Laterality: N/A;  . VIDEO BRONCHOSCOPY N/A 05/28/2014   Procedure: VIDEO BRONCHOSCOPY;  Surgeon: Melrose Nakayama, MD;  Location: [place];  Service: Thoracic;  Laterality: N/A;   Social History   Tobacco Use  . Smoking status: Never Smoker  . Smokeless tobacco: Never Used  Substance Use Topics  . Alcohol use: No   family history includes Allergies in her daughter; Alzheimer's disease in her father; Breast cancer in her daughter and maternal grandmother; Cancer in her maternal grandmother and mother; Cancer (age of onset: 19) in her daughter; Colon cancer in her mother; Colon polyps in her brother; Diabetes in her father and mother; Hyperlipidemia in her father.  Medications: Current Outpatient Medications  Medication Sig Dispense Refill  . acetaminophen (TYLENOL ARTHRITIS PAIN) 650 MG CR tablet Take 650 mg by mouth every 8 (eight) hours as needed for pain.    Marland Kitchen atorvastatin (LIPITOR) 40 MG tablet Take 1 tablet (40 mg total) by mouth daily. 90 tablet 3  . Calcium Carb-Cholecalciferol (SM CALCIUM/VITAMIN D) 600-800  MG-UNIT TABS Take by mouth.    . cetirizine (ZYRTEC) 10 MG tablet Take 1 tablet (10 mg total) by mouth daily. 90 tablet 3  . Cholecalciferol (VITAMIN D PO) Take 800 Units by mouth daily.    . diclofenac sodium (VOLTAREN) 1 % GEL Apply 2 g topically 4 (four) times daily. To affected joint. 100 g 11  . furosemide (LASIX) 20 MG tablet TAKE 1 TABLET BY MOUTH  DAILY 90 tablet 1  . gabapentin (NEURONTIN) 300 MG capsule Take 1 capsule (300 mg total) by mouth at bedtime. 90 capsule 3  . pantoprazole (PROTONIX) 40 MG tablet TAKE 1 TABLET BY MOUTH  DAILY 90 tablet 3   No current  facility-administered medications for this visit.    Allergies  Allergen Reactions  . Codeine Itching    *Can take Hydrocodone*  . Tramadol     headache  . Protriptyline Palpitations  . Vivactil [Protriptyline Hcl] Palpitations    Health Maintenance Health Maintenance  Topic Date Due  . COLONOSCOPY  11/27/2018 (Originally 06/08/2013)  . MAMMOGRAM  03/29/2019  . TETANUS/TDAP  01/27/2024  . INFLUENZA VACCINE  Completed  . DEXA SCAN  Completed  . Hepatitis C Screening  Completed  . PNA vac Low Risk Adult  Completed     Exam:  BP (!) 122/48   Pulse (!) 57   Ht 5\' 2"  (1.575 m)   Wt 190 lb (86.2 kg)   BMI 34.75 kg/m  Wt Readings from Last 5 Encounters:  11/26/17 190 lb (86.2 kg)  10/14/17 188 lb (85.3 kg)  07/02/17 180 lb (81.6 kg)  07/19/16 183 lb (83 kg)  06/19/16 185 lb (83.9 kg)      Gen: Well NAD HEENT: EOMI,  MMM Lungs: Normal work of breathing. CTABL Heart: RRR no MRG Abd: NABS, Soft. Nondistended, Nontender Exts: Brisk capillary refill, warm and well perfused.  Psych: Alert and oriented tearful at times with affect.  No SI or HI.  Depression screen [facility] 2/9 11/26/2017 10/14/2017 10/14/2017 07/13/2015 06/10/2015  Decreased Interest 0 0 0 0 0  Down, Depressed, Hopeless 1 0 0 0 0  PHQ - 2 Score 1 0 0 0 0  Altered sleeping 1 2 - - -  Tired, decreased energy 1 1 - - -  Change in appetite 3 2 - - -  Feeling bad or failure about yourself  0 0 - - -  Trouble concentrating 0 0 - - -  Moving slowly or fidgety/restless 0 0 - - -  Suicidal thoughts 0 0 - - -  PHQ-9 Score 6 5 - - -  Difficult doing work/chores Somewhat difficult Somewhat difficult - - -       Lab and Radiology Results   Chemistry      Component Value Date/Time   NA 139 10/16/2017 1123   K 4.0 10/16/2017 1123   CL 104 10/16/2017 1123   CO2 27 10/16/2017 1123   BUN 14 10/16/2017 1123   CREATININE 0.96 10/16/2017 1123      Component Value Date/Time   CALCIUM 10.1 10/16/2017 1123   ALKPHOS  130 06/11/2016 1204   AST 25 10/16/2017 1123   ALT 18 10/16/2017 1123   BILITOT 0.4 10/16/2017 1123     Lab Results  Component Value Date   CHOL 210 (H) 10/16/2017   HDL 69 10/16/2017   LDLCALC 112 (H) 10/16/2017   LDLDIRECT 231.2 03/28/2010   TRIG 169 (H) 10/16/2017   CHOLHDL 3.0 10/16/2017  Assessment and Plan: 67 y.o. female with  Well adult.  Doing reasonably well.  Discussed weight strategies and diet and exercise strategies.  Additionally discussed stress management strategies and recommended consider counseling for dealing with her somewhat overbearing husband.  Consider medications as needed for anxiety or depression symptoms.  Recheck with me as needed in the near future or 6 to 12 months.  Schedule appointment for Medicare nurse visit in the near future as well.   No orders of the defined types were placed in this encounter.  No orders of the defined types were placed in this encounter.    Discussed warning signs or symptoms. Please see discharge instructions. Patient expresses understanding.

## 2017-12-23 NOTE — Progress Notes (Signed)
Subjective:   Christina Hayes is a 67 y.o. female who presents for an Initial Medicare Annual Wellness Visit.  Review of Systems    No ROS.  Medicare Wellness Visit. Additional risk factors are reflected in the social history.   Cardiac Risk Factors include: dyslipidemia Sleep patterns:getting 7-9 hours of sleep a night. Wakes up feels rested. Wakes up occasionally to go to the bathroom.     Home Safety/Smoke Alarms: Feels safe in home. Smoke alarms in place.  Living environment; Lives with husband in a 1 story home stairs on bACK deck and hand rails are there.  Shower is a step over tub and no grab rails in the shower.  Seat Belt Safety/Bike Helmet: Wears seat belt.   Female:   Pap-  Aged out     Mammo-  utd     Dexa scan-   utd     CCS- postponed       Objective:    Today's Vitals   12/24/17 1129  BP: 120/70  Pulse: 67  SpO2: 100%  Weight: 193 lb (87.5 kg)  Height: 5\' 2"  (1.575 m)   Body mass index is 35.3 kg/m.  Advanced Directives 12/24/2017 12/28/2015 07/13/2015 04/17/2015 05/25/2014 04/23/2013 03/26/2013  Does Patient Have a Medical Advance Directive? No Yes Yes No No Patient has advance directive, copy in chart Patient does not have advance directive  Type of Advance Directive - [place];Living will Living will;Mental [place];Living will -  Does patient want to make changes to medical advance directive? - - No - Patient declined - - - -  Copy of [place] in Chart? - - No - copy requested - - - -  Would patient like information on creating a medical advance directive? No - Patient declined - - No - patient declined information No - patient declined information - -    Current Medications (verified) Outpatient Encounter Medications as of 12/24/2017  Medication Sig  . acetaminophen (TYLENOL ARTHRITIS PAIN) 650 MG CR tablet Take 650 mg by mouth every 8 (eight) hours as needed for pain.   Marland Kitchen atorvastatin (LIPITOR) 40 MG tablet Take 1 tablet (40 mg total) by mouth daily.  . Calcium Carb-Cholecalciferol (SM CALCIUM/VITAMIN D) 600-800 MG-UNIT TABS Take by mouth.  . cetirizine (ZYRTEC) 10 MG tablet Take 1 tablet (10 mg total) by mouth daily.  . Cholecalciferol (VITAMIN D PO) Take 800 Units by mouth daily.  . diclofenac sodium (VOLTAREN) 1 % GEL Apply 2 g topically 4 (four) times daily. To affected joint.  . furosemide (LASIX) 20 MG tablet TAKE 1 TABLET BY MOUTH  DAILY  . gabapentin (NEURONTIN) 300 MG capsule Take 1 capsule (300 mg total) by mouth at bedtime.  . pantoprazole (PROTONIX) 40 MG tablet TAKE 1 TABLET BY MOUTH  DAILY   No facility-administered encounter medications on file as of 12/24/2017.     Allergies (verified) Codeine; Tramadol; Protriptyline; and Vivactil [protriptyline hcl]   History: Past Medical History:  Diagnosis Date  . Acid reflux   . Allergic rhinitis, cause unspecified   . Allergy   . Anemia    years ago   . Complication of anesthesia SLOW TO WAKE  . Cough   . DJD (degenerative joint disease)    spine  . Frequency of urination   . H/O hiatal hernia    small  . Head cold   . Headache(784.0)    migraqine- uses  essential oils  . HYPERLIPIDEMIA, MIXED 12/03/2006   PT STATES NOT TAKING MED AS PRESCRIBED  . MIGRAINES, HX OF 12/03/2006  . Neuromuscular disorder ([place])   . Nocturia   . OSA on CPAP   . Osteopenia   . PONV (postoperative nausea and vomiting)    in past  . Shortness of breath   . Sleep apnea   . SUI (stress urinary incontinence, female)   . Urgency of urination   . VERTIGO, HX OF 12/03/2006   Past Surgical History:  Procedure Laterality Date  . COLONOSCOPY    . PUBOVAGINAL SLING  04/30/2011   Procedure: Gaynelle Arabian;  Surgeon: Bernestine Amass, MD;  Location: [facility];  Service: Urology;  Laterality: N/A;  sub urethral sling    possible ower  . RIGID BRONCHOSCOPY N/A 04/23/2013   Procedure: LASER  BRONCHOSCOPY;  Surgeon: Melrose Nakayama, MD;  Location: [place] [facility];  Service: Thoracic;  Laterality: N/A;  . [place]  . VIDEO BRONCHOSCOPY Bilateral 03/26/2013   Procedure: VIDEO BRONCHOSCOPY WITHOUT FLUORO;  Surgeon: Tanda Rockers, MD;  Location: [facility];  Service: Cardiopulmonary;  Laterality: Bilateral;  . VIDEO BRONCHOSCOPY N/A 04/23/2013   Procedure: VIDEO BRONCHOSCOPY;  Surgeon: Melrose Nakayama, MD;  Location: [place];  Service: Thoracic;  Laterality: N/A;  . VIDEO BRONCHOSCOPY N/A 05/28/2014   Procedure: VIDEO BRONCHOSCOPY;  Surgeon: Melrose Nakayama, MD;  Location[facility];  Service: Thoracic;  Laterality: N/A;   Family History  Problem Relation Age of Onset  . Cancer Mother        Colon Cancer survivor  . Diabetes Mother   . Colon cancer Mother   . Diabetes Father   . Hyperlipidemia Father   . Alzheimer's disease Father   . Cancer Maternal Grandmother        Breast Cancer  . Breast cancer Maternal Grandmother   . Cancer Daughter 36       stage III-B breast CA  . Breast cancer Daughter   . Colon polyps Brother   . Allergies Daughter   . Esophageal cancer Neg Hx   . Rectal cancer Neg Hx   . Stomach cancer Neg Hx    Social History   Socioeconomic History  . Marital status: Married    Spouse name: Alvester Chou  . Number of children: 1  . Years of education: 67  . Highest education level: 12th grade  Occupational History  . Occupation: Retired    Comment: Emergency planning/management officer  Social Needs  . Financial resource strain: Not hard at all  . Food insecurity:    Worry: Never true    Inability: Never true  . Transportation needs:    Medical: No    Non-medical: No  Tobacco Use  . Smoking status: Never Smoker  . Smokeless tobacco: Never Used  Substance and Sexual Activity  . Alcohol use: No  . Drug use: No  . Sexual activity: Not Currently    Partners: Male    Birth control/protection: Surgical  Lifestyle   . Physical activity:    Days per week: 0 days    Minutes per session: 0 min  . Stress: Only a little  Relationships  . Social connections:    Talks on phone: Twice a week    Gets together: Never    Attends religious service: Never    Active member of club or organization: No    Attends meetings of  clubs or organizations: Never    Relationship status: Married  Other Topics Concern  . 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 Mom having a hip replacement. Patient under some stress in dealing with husband anad mother. Encouraged pt. To get out and walk at least 3 days a week for 30 minutes at a time to help with her stress levels.    Tobacco Counseling Counseling given: Not Answered   Clinical Intake:  Pre-visit preparation completed: Yes  Pain : No/denies pain     Nutritional Risks: None Diabetes: No  How often do you need to have someone help you when you read instructions, pamphlets, or other written materials from your doctor or pharmacy?: 1 - Never What is the last grade level you completed in school?: 12  Interpreter Needed?: No  Information entered by :: Orlie Dakin, LPN   Activities of Daily Living In your present state of health, do you have any difficulty performing the following activities: 12/24/2017  Hearing? N  Vision? N  Difficulty concentrating or making decisions? N  Walking or climbing stairs? Y  Comment does have arthritis in knees which hurts off and on  Dressing or bathing? N  Doing errands, shopping? N  Preparing Food and eating ? N  Using the Toilet? N  In the past six months, have you accidently leaked urine? N  Do you have problems with loss of bowel control? N  Managing your Medications? N  Managing your Finances? N  Housekeeping or managing your  Housekeeping? N  Some recent data might be hidden     Immunizations and Health Maintenance Immunization History  Administered Date(s) Administered  . Influenza, High Dose Seasonal PF 10/23/2016  . Influenza,inj,Quad PF,6+ Mos 11/10/2015, 10/14/2017  . Influenza-Unspecified 10/23/2016  . Pneumococcal Conjugate-13 07/13/2015  . Pneumococcal Polysaccharide-23 03/25/2012, 05/16/2017  . Td 05/20/2003  . Tdap 01/26/2014  . Zoster 04/25/2010   There are no preventive care reminders to display for this patient.  Patient Care Team: Gregor Hams, MD as PCP - General (Family Medicine)  Indicate any recent Medical Services you may have received from other than [facility] providers in the past year (date may be approximate).     Assessment:   This is a routine wellness examination for Levaeh.Physical assessment deferred to PCP.   Hearing/Vision screen  Visual Acuity Screening   Right eye Left eye Both eyes  Without correction:     With correction: 20/25 20/40 20/20   Hearing Screening Comments: Whisper test done and patient reported back all 3 words correctly  Dietary issues and exercise activities discussed: Current Exercise Habits: The patient does not participate in regular exercise at present, Exercise limited by: orthopedic condition(s)(arthritis) Diet eats well per patient. Eats veggies and fruits and all other things. Breakfast: skips breakfast Lunch:  sandwich Dinner:  Meat and vegetables. No milk only with cereal, some cheese. Drinks around 24 ounces of water daily.      Goals    . Exercise 3x per week (30 min per time)     Exercise like walking at least 3 times a week for 30 minutes at a time to help relieve stress.      Depression Screen PHQ 2/9 Scores 12/24/2017 11/26/2017 10/14/2017 10/14/2017 07/13/2015 06/10/2015 04/19/2015  PHQ - 2 Score 1 1 0 0 0 0 0  PHQ- 9 Score 6 6 5  - - - -    Fall Risk Fall Risk  12/24/2017 10/14/2017 10/14/2017 07/13/2015 06/10/2015  Falls in the past  year? 0 No No No No  Risk for fall due to : Impaired balance/gait - - - -  Follow up Falls prevention discussed - - - -    Is the patient's home free of loose throw rugs in walkways, pet beds, electrical cords, etc?   yes      Grab bars in the bathroom? no      Handrails on the stairs?   yes      Adequate lighting?   yes  Cognitive Function:     6CIT Screen 12/24/2017  What Year? 0 points  What month? 0 points  What time? 0 points  Count back from 20 0 points  Months in reverse 0 points  Repeat phrase 0 points  Total Score 0    Screening Tests Health Maintenance  Topic Date Due  . COLONOSCOPY  11/27/2018 (Originally 06/08/2013)  . MAMMOGRAM  03/29/2019  . TETANUS/TDAP  01/27/2024  . INFLUENZA VACCINE  Completed  . DEXA SCAN  Completed  . Hepatitis C Screening  Completed  . PNA vac Low Risk Adult  Completed       Plan:      Ms. Tocco , Thank you for taking time to come for your Medicare Wellness Visit. I appreciate your ongoing commitment to your health goals. Please review the following plan we discussed and let me know if I can assist you in the future.  Please schedule your next medicare wellness visit with me in 1 yr. Bring a copy of your living will and/or healthcare power of attorney to your next office visit. Continue doing brain stimulating activities (puzzles, reading, adult coloring books, staying active) to keep memory sharp.     These are the goals we discussed: Goals    . Exercise 3x per week (30 min per time)     Exercise like walking at least 3 times a week for 30 minutes at a time to help relieve stress.       This is a list of the screening recommended for you and due dates:  Health Maintenance  Topic Date Due  . Colon Cancer Screening  11/27/2018*  . Mammogram  03/29/2019  . Tetanus Vaccine  01/27/2024  . Flu Shot  Completed  . DEXA scan (bone density measurement)  Completed  .  Hepatitis C: One time screening is recommended by Center for  Disease Control  (CDC) for  adults born from [birth year] through 1966.   Completed  . Pneumonia vaccines  Completed  *Topic was postponed. The date shown is not the original due date.     I have personally reviewed and noted the following in the patient's chart:   . Medical and social history . Use of alcohol, tobacco or illicit drugs  . Current medications and supplements . Functional ability and status . Nutritional status . Physical activity . Advanced directives . List of other physicians . Hospitalizations, surgeries, and ER visits in previous 12 months . Vitals . Screenings to include cognitive, depression, and falls . Referrals and appointments  In addition, I have reviewed and discussed with patient certain preventive protocols, quality metrics, and best practice recommendations. A written personalized care plan for preventive services as well as general preventive health recommendations were provided to patient.  Joanne Chars, LPN   [id number]

## 2017-12-24 ENCOUNTER — Encounter: Payer: Self-pay | Admitting: Family Medicine

## 2017-12-24 ENCOUNTER — Ambulatory Visit (INDEPENDENT_AMBULATORY_CARE_PROVIDER_SITE_OTHER): Payer: Medicare Other | Admitting: *Deleted

## 2017-12-24 VITALS — BP 120/70 | HR 67 | Ht 62.0 in | Wt 193.0 lb

## 2017-12-24 DIAGNOSIS — Z0000 Encounter for general adult medical examination without abnormal findings: Secondary | ICD-10-CM

## 2017-12-24 NOTE — Patient Instructions (Addendum)
Christina Hayes , Thank you for taking time to come for your Medicare Wellness Visit. I appreciate your ongoing commitment to your health goals. Please review the following plan we discussed and let me know if I can assist you in the future.  Please schedule your next medicare wellness visit with me in 1 yr. Bring a copy of your living will and/or healthcare power of attorney to your next office visit. Continue doing brain stimulating activities (puzzles, reading, adult coloring books, staying active) to keep memory sharp.   These are the goals we discussed: Goals    . Exercise 3x per week (30 min per time)     Exercise like walking at least 3 times a week for 30 minutes at a time to help relieve stress.     Stress and Stress Management Stress is a normal reaction to life events. It is what you feel when life demands more than you are used to or more than you can handle. Some stress can be useful. For example, the stress reaction can help you catch the last bus of the day, study for a test, or meet a deadline at work. But stress that occurs too often or for too long can cause problems. It can affect your emotional health and interfere with relationships and normal daily activities. Too much stress can weaken your immune system and increase your risk for physical illness. If you already have a medical problem, stress can make it worse. What are the causes? All sorts of life events may cause stress. An event that causes stress for one person may not be stressful for another person. Major life events commonly cause stress. These may be positive or negative. Examples include losing your job, moving into a new home, getting married, having a baby, or losing a loved one. Less obvious life events may also cause stress, especially if they occur day after day or in combination. Examples include working long hours, driving in traffic, caring for children, being in debt, or being in a difficult relationship. What  are the signs or symptoms? Stress may cause emotional symptoms including, the following:  Anxiety. This is feeling worried, afraid, on edge, overwhelmed, or out of control.  Anger. This is feeling irritated or impatient.  Depression. This is feeling sad, down, helpless, or guilty.  Difficulty focusing, remembering, or making decisions.  Stress may cause physical symptoms, including the following:  Aches and pains. These may affect your head, neck, back, stomach, or other areas of your body.  Tight muscles or clenched jaw.  Low energy or trouble sleeping.  Stress may cause unhealthy behaviors, including the following:  Eating to feel better (overeating) or skipping meals.  Sleeping too little, too much, or both.  Working too much or putting off tasks (procrastination).  Smoking, drinking alcohol, or using drugs to feel better.  How is this diagnosed? Stress is diagnosed through an assessment by your health care provider. Your health care provider will ask questions about your symptoms and any stressful life events.Your health care provider will also ask about your medical history and may order blood tests or other tests. Certain medical conditions and medicine can cause physical symptoms similar to stress. Mental illness can cause emotional symptoms and unhealthy behaviors similar to stress. Your health care provider may refer you to a mental health professional for further evaluation. How is this treated? Stress management is the recommended treatment for stress.The goals of stress management are reducing stressful life events and coping with  stress in healthy ways. Techniques for reducing stressful life events include the following:  Stress identification. Self-monitor for stress and identify what causes stress for you. These skills may help you to avoid some stressful events.  Time management. Set your priorities, keep a calendar of events, and learn to say "no." These tools  can help you avoid making too many commitments.  Techniques for coping with stress include the following:  Rethinking the problem. Try to think realistically about stressful events rather than ignoring them or overreacting. Try to find the positives in a stressful situation rather than focusing on the negatives.  Exercise. Physical exercise can release both physical and emotional tension. The key is to find a form of exercise you enjoy and do it regularly.  Relaxation techniques. These relax the body and mind. Examples include yoga, meditation, tai chi, biofeedback, deep breathing, progressive muscle relaxation, listening to music, being out in nature, journaling, and other hobbies. Again, the key is to find one or more that you enjoy and can do regularly.  Healthy lifestyle. Eat a balanced diet, get plenty of sleep, and do not smoke. Avoid using alcohol or drugs to relax.  Strong support network. Spend time with family, friends, or other people you enjoy being around.Express your feelings and talk things over with someone you trust.  Counseling or talktherapy with a mental health professional may be helpful if you are having difficulty managing stress on your own. Medicine is typically not recommended for the treatment of stress.Talk to your health care provider if you think you need medicine for symptoms of stress. Follow these instructions at home:  Keep all follow-up visits as directed by your health care provider.  Take all medicines as directed by your health care provider. Contact a health care provider if:  Your symptoms get worse or you start having new symptoms.  You feel overwhelmed by your problems and can no longer manage them on your own. Get help right away if:  You feel like hurting yourself or someone else. This information is not intended to replace advice given to you by your health care provider. Make sure you discuss any questions you have with your health care  provider. Document Released: 07/04/2000 Document Revised: 06/16/2015 Document Reviewed: 09/02/2012 Elsevier Interactive Patient Education  2017 [place]  Walking is a great form of exercise to increase your strength, endurance and overall fitness.  A walking program can help you start slowly and gradually build endurance as you go.  Everyone's ability is different, so each person's starting point will be different.  You do not have to follow them exactly.  The are just samples. You should simply find out what's right for you and stick to that program.   In the beginning, you'll start off walking 2-3 times a day for short distances.  As you get stronger, you'll be walking further at just 1-2 times per day.  A. You Can Walk For A Certain Length Of Time Each Day    Walk 5 minutes 3 times per day.  Increase 2 minutes every 2 days (3 times per day).  Work up to 25-30 minutes (1-2 times per day).   Example:   Day 1-2 5 minutes 3 times per day   Day 7-8 12 minutes 2-3 times per day   Day 13-14 25 minutes 1-2 times per day  B. You Can Walk For a Certain Distance Each Day     Distance can be substituted for time.  Example:   3 trips to mailbox (at road)   3 trips to corner of block   3 trips around the block  C. Go to local high school and use the track.    Walk for distance  around track  Or time 30 minutes   D. Walk 30    Please only do the exercises that your therapist has initialed and dated   

## 2018-01-24 ENCOUNTER — Encounter: Payer: Self-pay | Admitting: Gastroenterology

## 2018-02-17 ENCOUNTER — Other Ambulatory Visit: Payer: Self-pay | Admitting: Family Medicine

## 2018-02-17 DIAGNOSIS — Z1231 Encounter for screening mammogram for malignant neoplasm of breast: Secondary | ICD-10-CM

## 2018-02-19 ENCOUNTER — Ambulatory Visit (AMBULATORY_SURGERY_CENTER): Payer: Self-pay

## 2018-02-19 ENCOUNTER — Other Ambulatory Visit: Payer: Self-pay

## 2018-02-19 ENCOUNTER — Encounter: Payer: Self-pay | Admitting: Gastroenterology

## 2018-02-19 VITALS — Ht 62.0 in | Wt 196.8 lb

## 2018-02-19 DIAGNOSIS — Z800 Family history of malignant neoplasm of digestive organs: Secondary | ICD-10-CM

## 2018-02-19 MED ORDER — NA SULFATE-K SULFATE-MG SULF 17.5-3.13-1.6 GM/177ML PO SOLN: 1.0000 | Freq: Once | ORAL | 0 refills | Status: AC

## 2018-02-19 NOTE — Progress Notes (Signed)
No egg or soy allergy known to patient  No issues with past sedation with any surgeries  or procedures, no intubation problems  No diet pills per patient No home 02 use per patient  No blood thinners per patient  Pt denies issues with constipation  No A fib or A flutter  EMMI video sent to pt's e mail , pt declined    

## 2018-02-20 ENCOUNTER — Telehealth: Payer: Self-pay | Admitting: Gastroenterology

## 2018-02-20 NOTE — Telephone Encounter (Signed)
PT called back stated that the Suprep is to expensive and would like to know if there is something else she can get for cheaper.

## 2018-02-24 NOTE — Telephone Encounter (Signed)
PT called back stated that the Suprep is to expensive and would like to know if there is something else she can get for cheaper.

## 2018-02-24 NOTE — Telephone Encounter (Signed)
Pt states prep is 109.00 - colon is 2-12-- will give pt a sample-- LOT [id number]  Exp 11-21  Pt to pick up 3rd floor  desk-Marie PV

## 2018-03-05 ENCOUNTER — Ambulatory Visit (AMBULATORY_SURGERY_CENTER): Payer: Medicare Other | Admitting: Gastroenterology

## 2018-03-05 ENCOUNTER — Encounter: Payer: Self-pay | Admitting: Gastroenterology

## 2018-03-05 VITALS — BP 115/54 | HR 61 | Temp 98.2°F | Resp 11 | Ht 62.0 in | Wt 196.0 lb

## 2018-03-05 DIAGNOSIS — D123 Benign neoplasm of transverse colon: Secondary | ICD-10-CM

## 2018-03-05 DIAGNOSIS — Z800 Family history of malignant neoplasm of digestive organs: Secondary | ICD-10-CM

## 2018-03-05 DIAGNOSIS — Z1211 Encounter for screening for malignant neoplasm of colon: Secondary | ICD-10-CM

## 2018-03-05 MED ORDER — SODIUM CHLORIDE 0.9 % IV SOLN: 500.0000 mL | Freq: Once | INTRAVENOUS | Status: DC

## 2018-03-05 NOTE — Progress Notes (Signed)
Called to room to assist during endoscopic procedure.  Patient ID and intended procedure confirmed with present staff. Received instructions for my participation in the procedure from the performing physician.  

## 2018-03-05 NOTE — Patient Instructions (Signed)
Information on polyps and hemorrhoids given to you today.  Await pathology results.  YOU HAD AN ENDOSCOPIC PROCEDURE TODAY AT [facility]:   Refer to the procedure report that was given to you for any specific questions about what was found during the examination.  If the procedure report does not answer your questions, please call your gastroenterologist to clarify.  If you requested that your care partner not be given the details of your procedure findings, then the procedure report has been included in a sealed envelope for you to review at your convenience later.  YOU SHOULD EXPECT: Some feelings of bloating in the abdomen. Passage of more gas than usual.  Walking can help get rid of the air that was put into your GI tract during the procedure and reduce the bloating. If you had a lower endoscopy (such as a colonoscopy or flexible sigmoidoscopy) you may notice spotting of blood in your stool or on the toilet paper. If you underwent a bowel prep for your procedure, you may not have a normal bowel movement for a few days.  Please Note:  You might notice some irritation and congestion in your nose or some drainage.  This is from the oxygen used during your procedure.  There is no need for concern and it should clear up in a day or so.  SYMPTOMS TO REPORT IMMEDIATELY:   Following lower endoscopy (colonoscopy or flexible sigmoidoscopy):  Excessive amounts of blood in the stool  Significant tenderness or worsening of abdominal pains  Swelling of the abdomen that is new, acute  Fever of 100F or higher  For urgent or emergent issues, a gastroenterologist can be reached at any hour by calling [phone].   DIET:  We do recommend a small meal at first, but then you may proceed to your regular diet.  Drink plenty of fluids but you should avoid alcoholic beverages for 24 hours.  ACTIVITY:  You should plan to take it easy for the rest of today and you should NOT DRIVE or use  heavy machinery until tomorrow (because of the sedation medicines used during the test).    FOLLOW UP: Our staff will call the number listed on your records the next business day following your procedure to check on you and address any questions or concerns that you may have regarding the information given to you following your procedure. If we do not reach you, we will leave a message.  However, if you are feeling well and you are not experiencing any problems, there is no need to return our call.  We will assume that you have returned to your regular daily activities without incident.  If any biopsies were taken you will be contacted by phone or by letter within the next 1-3 weeks.  Please call us at [phone] if you have not heard about the biopsies in 3 weeks.    SIGNATURES/CONFIDENTIALITY: You and/or your care partner have signed paperwork which will be entered into your electronic medical record.  These signatures attest to the fact that that the information above on your After Visit Summary has been reviewed and is understood.  Full responsibility of the confidentiality of this discharge information lies with you and/or your care-partner.

## 2018-03-05 NOTE — Progress Notes (Signed)
Report given to PACU, vss 

## 2018-03-05 NOTE — Op Note (Signed)
[facility] Patient Name: Christina Hayes Procedure Date: 03/05/2018 10:17 AM MRN: [id number] Endoscopist: Ladene Artist , MD Age: 68 Referring MD:  Date of Birth: 1950-04-17 Gender: Female Account #: [id number] Procedure:                Colonoscopy Indications:              Screening in patient at increased risk: Family                            history of 1st-degree relative with colorectal                            cancer Medicines:                Monitored Anesthesia Care Procedure:                Pre-Anesthesia Assessment:                           - Prior to the procedure, a History and Physical                            was performed, and patient medications and                            allergies were reviewed. The patient's tolerance of                            previous anesthesia was also reviewed. The risks                            and benefits of the procedure and the sedation                            options and risks were discussed with the patient.                            All questions were answered, and informed consent                            was obtained. Prior Anticoagulants: The patient has                            taken no previous anticoagulant or antiplatelet                            agents. ASA Grade Assessment: II - A patient with                            mild systemic disease. After reviewing the risks                            and benefits, the patient was deemed in  satisfactory condition to undergo the procedure.                           After obtaining informed consent, the colonoscope                            was passed under direct vision. Throughout the                            procedure, the patient's blood pressure, pulse, and                            oxygen saturations were monitored continuously. The                            Colonoscope was introduced through the anus and                   advanced to the the cecum, identified by                            appendiceal orifice and ileocecal valve. The                            ileocecal valve, appendiceal orifice, and rectum                            were photographed. The quality of the bowel                            preparation was good. The colonoscopy was performed                            without difficulty. The patient tolerated the                            procedure well. Scope In: 10:31:45 AM Scope Out: 10:42:46 AM Scope Withdrawal Time: 0 hours 9 minutes 37 seconds  Total Procedure Duration: 0 hours 11 minutes 1 second  Findings:                 The perianal and digital rectal examinations were                            normal.                           A 8 mm polyp was found in the transverse colon. The                            polyp was sessile. The polyp was removed with a                            cold snare. Resection and retrieval were complete.  Internal hemorrhoids were found during                            retroflexion. The hemorrhoids were small and Grade                            I (internal hemorrhoids that do not prolapse).                           The exam was otherwise without abnormality on                            direct and retroflexion views. Complications:            No immediate complications. Estimated blood loss:                            None. Estimated Blood Loss:     Estimated blood loss: none. Impression:               - One 8 mm polyp in the transverse colon, removed                            with a cold snare. Resected and retrieved.                           - Internal hemorrhoids.                           - The examination was otherwise normal on direct                            and retroflexion views. Recommendation:           - Repeat colonoscopy in 5 years for surveillance.                           - Patient has a  contact number available for                            emergencies. The signs and symptoms of potential                            delayed complications were discussed with the                            patient. Return to normal activities tomorrow.                            Written discharge instructions were provided to the                            patient.                           - Resume previous diet.                           -  Continue present medications.                           - Await pathology results. Ladene Artist, MD 03/05/2018 10:44:57 AM This report has been signed electronically.

## 2018-03-05 NOTE — Progress Notes (Signed)
Pt's states no medical or surgical changes since previsit or office visit. 

## 2018-03-06 ENCOUNTER — Telehealth: Payer: Self-pay | Admitting: *Deleted

## 2018-03-06 NOTE — Telephone Encounter (Signed)
  Follow up Call-  Call back number 03/05/2018  Post procedure Call Back phone  # [phone]  Permission to leave phone message Yes  Some recent data might be hidden     Patient questions:  Do you have a fever, pain , or abdominal swelling? No. Pain Score  0   Have you tolerated food without any problems? Yes.    Have you been able to return to your normal activities? Yes.    Do you have any questions about your discharge instructions: Diet   No. Medications  No. Follow up visit  No.  Do you have questions or concerns about your Care? No.  Actions: * If pain score is 4 or above: 0 No action needed, pain <4.

## 2018-03-06 NOTE — Telephone Encounter (Signed)
First follow up call attempt. No answer. No voicemail.

## 2018-03-11 ENCOUNTER — Other Ambulatory Visit: Payer: Self-pay | Admitting: Family Medicine

## 2018-03-11 ENCOUNTER — Other Ambulatory Visit: Payer: Self-pay | Admitting: Dermatology

## 2018-03-11 DIAGNOSIS — D229 Melanocytic nevi, unspecified: Secondary | ICD-10-CM | POA: Diagnosis not present

## 2018-03-11 DIAGNOSIS — L820 Inflamed seborrheic keratosis: Secondary | ICD-10-CM | POA: Diagnosis not present

## 2018-03-11 DIAGNOSIS — D485 Neoplasm of uncertain behavior of skin: Secondary | ICD-10-CM | POA: Diagnosis not present

## 2018-03-11 DIAGNOSIS — K219 Gastro-esophageal reflux disease without esophagitis: Secondary | ICD-10-CM

## 2018-03-11 DIAGNOSIS — L821 Other seborrheic keratosis: Secondary | ICD-10-CM | POA: Diagnosis not present

## 2018-03-27 ENCOUNTER — Encounter: Payer: Self-pay | Admitting: Gastroenterology

## 2018-03-28 ENCOUNTER — Encounter: Payer: Self-pay | Admitting: Family Medicine

## 2018-03-28 MED ORDER — ATORVASTATIN CALCIUM 40 MG PO TABS: 40.0000 mg | ORAL_TABLET | Freq: Every day | ORAL | 1 refills | Status: DC

## 2018-04-01 ENCOUNTER — Ambulatory Visit
Admission: RE | Admit: 2018-04-01 | Discharge: 2018-04-01 | Disposition: A | Discharge status: Home / Self Care | Payer: Medicare Other | Source: Ambulatory Visit | Attending: Family Medicine | Admitting: Family Medicine

## 2018-04-01 DIAGNOSIS — Z1231 Encounter for screening mammogram for malignant neoplasm of breast: Secondary | ICD-10-CM

## 2018-05-01 ENCOUNTER — Other Ambulatory Visit: Payer: Self-pay | Admitting: *Deleted

## 2018-05-01 DIAGNOSIS — C3490 Malignant neoplasm of unspecified part of unspecified bronchus or lung: Secondary | ICD-10-CM

## 2018-07-08 ENCOUNTER — Ambulatory Visit (INDEPENDENT_AMBULATORY_CARE_PROVIDER_SITE_OTHER): Payer: Medicare Other | Admitting: Thoracic Surgery (Cardiothoracic Vascular Surgery)

## 2018-07-08 ENCOUNTER — Other Ambulatory Visit: Payer: Self-pay

## 2018-07-08 ENCOUNTER — Encounter: Payer: Self-pay | Admitting: Thoracic Surgery (Cardiothoracic Vascular Surgery)

## 2018-07-08 ENCOUNTER — Ambulatory Visit
Admission: RE | Admit: 2018-07-08 | Discharge: 2018-07-08 | Disposition: A | Discharge status: Home / Self Care | Payer: Medicare Other | Source: Ambulatory Visit | Attending: Thoracic Surgery (Cardiothoracic Vascular Surgery) | Admitting: Thoracic Surgery (Cardiothoracic Vascular Surgery)

## 2018-07-08 VITALS — BP 113/72 | HR 68 | Temp 97.9°F | Resp 16 | Ht 62.0 in | Wt 185.0 lb

## 2018-07-08 DIAGNOSIS — Z09 Encounter for follow-up examination after completed treatment for conditions other than malignant neoplasm: Secondary | ICD-10-CM | POA: Diagnosis not present

## 2018-07-08 DIAGNOSIS — R918 Other nonspecific abnormal finding of lung field: Secondary | ICD-10-CM | POA: Diagnosis not present

## 2018-07-08 DIAGNOSIS — Z85110 Personal history of malignant carcinoid tumor of bronchus and lung: Secondary | ICD-10-CM | POA: Diagnosis not present

## 2018-07-08 DIAGNOSIS — C3490 Malignant neoplasm of unspecified part of unspecified bronchus or lung: Secondary | ICD-10-CM

## 2018-07-08 DIAGNOSIS — J439 Emphysema, unspecified: Secondary | ICD-10-CM | POA: Diagnosis not present

## 2018-07-08 NOTE — Progress Notes (Signed)
[address]       [place] [address]             [phone]     HPI: Christina Hayes returns for a scheduled follow-up visit  Christina Hayes is a 68 year old woman with a history of hyperlipidemia, obstructive sleep apnea, migraines, hiatal hernia, and reflux who had a history of recurrent wheezing and respiratory infections.  She ultimately had a CT and bronchoscopy which revealed a carcinoid tumor of the left mainstem.  I did an endobronchial resection and laser ablation in April 2015.  She has not had any further wheezing since.  We did a surveillance bronchoscopy at a year.  She has been followed with CT scans since then.  Over the past year she has been doing well.  She has not had any wheezing or respiratory infections.  She denies cough and hemoptysis.  Past Medical History:  Diagnosis Date  . Acid reflux   . Allergic rhinitis, cause unspecified   . Allergy   . Anemia    years ago   . Cataract   . Complication of anesthesia SLOW TO WAKE  . Cough   . DJD (degenerative joint disease)    spine  . Frequency of urination   . H/O hiatal hernia    small  . Head cold   . Headache(784.0)    migraqine- uses essential oils  . HYPERLIPIDEMIA, MIXED 12/03/2006   PT STATES NOT TAKING MED AS PRESCRIBED  . MIGRAINES, HX OF 12/03/2006  . Neuromuscular disorder ([place])   . Nocturia   . OSA on CPAP   . Osteopenia   . PONV (postoperative nausea and vomiting)    in past  . Shortness of breath   . Sleep apnea    cpap  . SUI (stress urinary incontinence, female)   . Urgency of urination   . VERTIGO, HX OF 12/03/2006    Current Outpatient Medications  Medication Sig Dispense Refill  . acetaminophen (TYLENOL ARTHRITIS PAIN) 650 MG CR tablet Take 650 mg by mouth every 8 (eight) hours as needed for pain.    Marland Kitchen atorvastatin (LIPITOR) 40 MG tablet Take 1 tablet (40 mg total) by mouth daily. 90 tablet 1  . Calcium Carb-Cholecalciferol (SM CALCIUM/VITAMIN D) 600-800  MG-UNIT TABS Take by mouth.    . cetirizine (ZYRTEC) 10 MG tablet Take 1 tablet (10 mg total) by mouth daily. (Patient taking differently: Take 10 mg by mouth daily as needed. ) 90 tablet 3  . Cholecalciferol (VITAMIN D PO) Take 800 Units by mouth daily.    . diclofenac sodium (VOLTAREN) 1 % GEL Apply 2 g topically 4 (four) times daily. To affected joint. (Patient taking differently: Apply 2 g topically 4 (four) times daily as needed. To affected joint.) 100 g 11  . furosemide (LASIX) 20 MG tablet TAKE 1 TABLET BY MOUTH  DAILY (Patient taking differently: 20 mg daily as needed. ) 90 tablet 1  . gabapentin (NEURONTIN) 300 MG capsule Take 1 capsule (300 mg total) by mouth at bedtime. 90 capsule 3  . pantoprazole (PROTONIX) 40 MG tablet TAKE 1 TABLET BY MOUTH  DAILY 90 tablet 3   No current facility-administered medications for this visit.     Physical Exam BP 113/72 (BP Location: Right Arm, Patient Position: Sitting, Cuff Size: Large)   Pulse 68   Temp 97.9 F (36.6 C) (Other (Comment)) Comment (Src): THERMAL  Resp 16   Ht 5\' 2"  (1.575 m)  Wt 185 lb (83.9 kg)   SpO2 95% Comment: RA  BMI 33.65 kg/m  68 year old woman in no acute distress Alert and oriented x3 with no focal deficits Lungs clear with equal breath sounds bilaterally Cardiac regular rate and rhythm normal S1 and S2  Diagnostic Tests: CT CHEST WITHOUT CONTRAST  TECHNIQUE: Multidetector CT imaging of the chest was performed following the standard protocol without IV contrast.  COMPARISON:  07/02/2017 and 06/19/2016  FINDINGS: Cardiovascular: The heart is normal in size. No pericardial effusion. Stable mild tortuosity and calcification of the thoracic aorta. Stable three-vessel coronary artery calcifications.  Mediastinum/Nodes: No mediastinal or hilar mass or lymphadenopathy. The esophagus is unremarkable. Stable small hiatal hernia.  Lungs/Pleura: Stable mild emphysematous changes and apical scarring. No  infiltrates, edema or effusions.  Stable 3.5 mm left lower lobe pulmonary nodule on image number 73.  Stable 3 mm right lower lobe pulmonary nodule on image number 83.  No new pulmonary nodules or worrisome pulmonary lesions.  Upper Abdomen: No significant upper abdominal findings.  Musculoskeletal: No significant bony findings.  No breast masses, supraclavicular or axillary adenopathy. The thyroid gland appears normal.  IMPRESSION: 1. Stable right lower lobe and left lower lobe pulmonary nodules since 2017, considered benign. 2. No new pulmonary nodules or worrisome pulmonary lesions. 3. Stable mild emphysematous changes. 4. No mediastinal or hilar mass or adenopathy. 5. Stable aortic and coronary artery calcifications. 6. Small hiatal hernia.  Aortic Atherosclerosis (ICD10-I70.0) and Emphysema (ICD10-J43.9).   Electronically Signed   By: Marijo Sanes M.D.   On: 07/08/2018 09:45 I personally reviewed the CT images and concur with the findings noted above  Impression: Christina Hayes is a 68 year old woman who had an endobronchial resection and laser ablation of a carcinoid tumor of the left mainstem bronchus in April 2015.  She is now 5 years out with no evidence of recurrent disease.  She has had no wheezing since the procedure.  Although unlikely it is possible for carcinoid tumors to recur beyond 5 years.  Plan to see her back in a year with a chest x-ray.  We will plan to check a CT scan every couple of years.  Aortic and coronary atherosclerosis noted on CT-asymptomatic.  On Lipitor.  Plan: Return in 1 year with PA and lateral chest x-ray  Melrose Nakayama, MD [facility] [phone]

## 2018-07-18 ENCOUNTER — Other Ambulatory Visit: Payer: Self-pay | Admitting: Family Medicine

## 2018-07-24 ENCOUNTER — Other Ambulatory Visit: Payer: Self-pay | Admitting: Family Medicine

## 2018-07-29 ENCOUNTER — Telehealth: Payer: Self-pay | Admitting: Family Medicine

## 2018-07-29 MED ORDER — ATORVASTATIN CALCIUM 40 MG PO TABS: 40.0000 mg | ORAL_TABLET | Freq: Every day | ORAL | 1 refills | Status: DC

## 2018-07-29 NOTE — Telephone Encounter (Signed)
Received refill request from [facility] for atorvastatin.  Medication refilled.

## 2018-08-09 ENCOUNTER — Other Ambulatory Visit: Payer: Self-pay | Admitting: Family Medicine

## 2018-09-23 ENCOUNTER — Encounter: Payer: Self-pay | Admitting: Family Medicine

## 2018-10-11 ENCOUNTER — Other Ambulatory Visit: Payer: Self-pay | Admitting: Family Medicine

## 2018-10-24 ENCOUNTER — Encounter: Payer: Self-pay | Admitting: Family Medicine

## 2018-10-29 ENCOUNTER — Encounter: Payer: Self-pay | Admitting: Family Medicine

## 2018-10-29 MED ORDER — TRIAMCINOLONE ACETONIDE 0.1 % EX CREA: 1.0000 "application " | TOPICAL_CREAM | Freq: Two times a day (BID) | CUTANEOUS | 12 refills | Status: DC

## 2018-10-29 MED ORDER — ZOLPIDEM TARTRATE 10 MG PO TABS: 10.0000 mg | ORAL_TABLET | Freq: Every evening | ORAL | 5 refills | Status: DC | PRN

## 2018-10-29 NOTE — Addendum Note (Signed)
Addended by: Alena Bills R on: 10/29/2018 02:40 PM   Modules accepted: Orders

## 2018-10-30 MED ORDER — ZOLPIDEM TARTRATE 10 MG PO TABS: 10.0000 mg | ORAL_TABLET | Freq: Every evening | ORAL | 1 refills | Status: DC | PRN

## 2018-10-30 MED ORDER — FUROSEMIDE 20 MG PO TABS: 20.0000 mg | ORAL_TABLET | Freq: Every day | ORAL | 0 refills | Status: DC

## 2018-10-30 MED ORDER — TRIAMCINOLONE ACETONIDE 0.1 % EX CREA: 1.0000 "application " | TOPICAL_CREAM | Freq: Two times a day (BID) | CUTANEOUS | 12 refills | Status: DC

## 2018-10-30 NOTE — Addendum Note (Signed)
Addended by: Lavon Paganini on: 10/30/2018 09:38 AM   Modules accepted: Orders

## 2018-12-20 ENCOUNTER — Other Ambulatory Visit: Payer: Self-pay | Admitting: Family Medicine

## 2018-12-22 NOTE — Telephone Encounter (Signed)
Must make office visit

## 2018-12-23 NOTE — Progress Notes (Signed)
Subjective:   Christina Hayes is a 68 y.o. female who presents for Medicare Annual (Subsequent) preventive examination.  Review of Systems:  No ROS.  Medicare Wellness Virtual Visit.  Visual/audio telehealth visit, UTA vital signs.   See social history for additional risk factors.    Cardiac Risk Factors include: advanced age (>37men, >101 women);sedentary lifestyle Sleep patterns: Getting 8 hours of sleep a night. Does not wake up to void. Wakes up and feels rested and ready for the day. Home Safety/Smoke Alarms: Feels safe in home. Smoke alarms in place.  Living environment; Lives with husband in a 1 story home and back deck has stairs and hand rails in place.Shower is a step over tub combo with anti slip mat in place. Seat Belt Safety/Bike Helmet: Wears seat belt.   Female:   Pap- Aged out      Mammo- UTD      Dexa scan-  UTD      CCS- UTD     Objective:     Vitals: There were no vitals taken for this visit.  There is no height or weight on file to calculate BMI.  Advanced Directives 12/30/2018 12/24/2017 12/28/2015 07/13/2015 04/17/2015 05/25/2014 04/23/2013  Does Patient Have a Medical Advance Directive? Yes No Yes Yes No No Patient has advance directive, copy in chart  Type of Advance Directive [place];Living will - [place];Living will Living will;Mental [place];Living will  Does patient want to make changes to medical advance directive? No - Patient declined - - No - Patient declined - - -  Copy of [place] in Chart? No - copy requested - - No - copy requested - - -  Would patient like information on creating a medical advance directive? - No - Patient declined - - No - patient declined information No - patient declined information -    Tobacco Social History   Tobacco Use  Smoking Status Never Smoker  Smokeless Tobacco Never Used     Counseling given: Not  Answered   Clinical Intake:  Pre-visit preparation completed: Yes  Pain : No/denies pain     Nutritional Risks: None Diabetes: No  How often do you need to have someone help you when you read instructions, pamphlets, or other written materials from your doctor or pharmacy?: 1 - Never What is the last grade level you completed in school?: 12  Interpreter Needed?: No  Information entered by :: Orlie Dakin, LPN  Past Medical History:  Diagnosis Date  . Acid reflux   . Allergic rhinitis, cause unspecified   . Allergy   . Anemia    years ago   . Cataract   . Complication of anesthesia SLOW TO WAKE  . Cough   . DJD (degenerative joint disease)    spine  . Frequency of urination   . H/O hiatal hernia    small  . Head cold   . Headache(784.0)    migraqine- uses essential oils  . HYPERLIPIDEMIA, MIXED 12/03/2006   PT STATES NOT TAKING MED AS PRESCRIBED  . MIGRAINES, HX OF 12/03/2006  . Neuromuscular disorder ([place])   . Nocturia   . OSA on CPAP   . Osteopenia   . PONV (postoperative nausea and vomiting)    in past  . Shortness of breath   . Sleep apnea    cpap  . SUI (stress urinary incontinence, female)   . Urgency  of urination   . VERTIGO, HX OF 12/03/2006   Past Surgical History:  Procedure Laterality Date  . COLONOSCOPY    . PUBOVAGINAL SLING  04/30/2011   Procedure: Gaynelle Arabian;  Surgeon: Bernestine Amass, MD;  Location: [facility];  Service: Urology;  Laterality: N/A;  sub urethral sling    possible ower  . RIGID BRONCHOSCOPY N/A 04/23/2013   Procedure: LASER BRONCHOSCOPY;  Surgeon: Melrose Nakayama, MD;  Location: [facility];  Service: Thoracic;  Laterality: N/A;  . [place]  . VIDEO BRONCHOSCOPY Bilateral 03/26/2013   Procedure: VIDEO BRONCHOSCOPY WITHOUT FLUORO;  Surgeon: Tanda Rockers, MD;  Location: [facility];  Service: Cardiopulmonary;  Laterality: Bilateral;  . VIDEO  BRONCHOSCOPY N/A 04/23/2013   Procedure: VIDEO BRONCHOSCOPY;  Surgeon: Melrose Nakayama, MD;  Location: [facility];  Service: Thoracic;  Laterality: N/A;  . VIDEO BRONCHOSCOPY N/A 05/28/2014   Procedure: VIDEO BRONCHOSCOPY;  Surgeon: Melrose Nakayama, MD;  Location: [facility] OR;  Service: Thoracic;  Laterality: N/A;   Family History  Problem Relation Age of Onset  . Cancer Mother        Colon Cancer survivor  . Diabetes Mother   . Colon cancer Mother   . Diabetes Father   . Hyperlipidemia Father   . Alzheimer's disease Father   . Cancer Maternal Grandmother        Breast Cancer  . Breast cancer Maternal Grandmother   . Cancer Daughter 80       stage III-B breast CA  . Breast cancer Daughter   . Colon polyps Brother   . Allergies Daughter   . Esophageal cancer Neg Hx   . Rectal cancer Neg Hx   . Stomach cancer Neg Hx    Social History   Socioeconomic History  . Marital status: Married    Spouse name: Alvester Chou  . Number of children: 1  . Years of education: 56  . Highest education level: 12th grade  Occupational History  . Occupation: Retired    Comment: Emergency planning/management officer  Social Needs  . Financial resource strain: Not hard at all  . Food insecurity    Worry: Never true    Inability: Never true  . Transportation needs    Medical: No    Non-medical: No  Tobacco Use  . Smoking status: Never Smoker  . Smokeless tobacco: Never Used  Substance and Sexual Activity  . Alcohol use: No  . Drug use: No  . Sexual activity: Not Currently    Partners: Male    Birth control/protection: Surgical  Lifestyle  . Physical activity    Days per week: 0 days    Minutes per session: 0 min  . Stress: Not at all  Relationships  . Social Herbalist on phone: Twice a week    Gets together: Never    Attends religious service: Never    Active member of club or organization: No    Attends meetings of clubs or organizations: Never    Relationship status: Married  Other Topics  Concern  . 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 Gets on computer during the day. Checks emails. Plays games on computer,  reads and shops online. Sees her daughter occasionally due to COVID    Outpatient Encounter Medications as of 12/30/2018  Medication Sig  . atorvastatin (LIPITOR) 40 MG tablet Take 1 tablet (40 mg total) by mouth daily.  . Calcium Carb-Cholecalciferol (SM CALCIUM/VITAMIN D) 600-800 MG-UNIT TABS Take by mouth.  . cetirizine (ZYRTEC) 10 MG tablet Take 1 tablet (10 mg total) by mouth daily. (Patient taking differently: Take 10 mg by mouth daily as needed. )  . Cholecalciferol (VITAMIN D PO) Take 800 Units by mouth daily.  . furosemide (LASIX) 20 MG tablet Take 1 tablet (20 mg total) by mouth daily. MUST MAKE OFFICE VISIT  . gabapentin (NEURONTIN) 300 MG capsule TAKE 1 CAPSULE BY MOUTH AT  BEDTIME  . pantoprazole (PROTONIX) 40 MG tablet TAKE 1 TABLET BY MOUTH  DAILY  . triamcinolone cream (KENALOG) 0.1 % Apply 1 application topically 2 (two) times daily.  Marland Kitchen acetaminophen (TYLENOL ARTHRITIS PAIN) 650 MG CR tablet Take 650 mg by mouth every 8 (eight) hours as needed for pain.  Marland Kitchen zolpidem (AMBIEN) 10 MG tablet Take 1 tablet (10 mg total) by mouth at bedtime as needed for sleep. (Patient not taking: Reported on 12/26/2018)   No facility-administered encounter medications on file as of 12/30/2018.     Activities of Daily Living In your present state of health, do you have any difficulty performing the following activities: 12/30/2018  Hearing? N  Vision? N  Difficulty concentrating or making decisions? N  Walking or climbing stairs? N  Dressing or bathing? N  Doing errands, shopping? N  Preparing Food and eating ? N  Using the Toilet? N  In the past six months, have you accidently  leaked urine? N  Comment has a bladder sling  Do you have problems with loss of bowel control? N  Managing your Medications? N  Managing your Finances? N  Housekeeping or managing your Housekeeping? N  Some recent data might be hidden    Patient Care Team: Gregor Hams, MD as PCP - General (Family Medicine)    Assessment:   This is a routine wellness examination for [place].Physical assessment deferred to PCP.   Exercise Activities and Dietary recommendations Current Exercise Habits: The patient does not participate in regular exercise at present, Exercise limited by: None identified Diet Eats a fairly healthy diet of vegetables, fruits and proteins. Takes daily calcium supplement Breakfast: skips maybe hot chocolate Lunch: sandwich Dinner: Meat and vegetables. Eats apples and bananas. Drinks 24 ounces of water daily.     Goals    . Exercise 3x per week (30 min per time)     Exercise like walking at least 3 times a week for 30 minutes at a time to help relieve stress.    . Weight (lb) < 200 lb (90.7 kg)     Would like to loose 30 lbs.       Fall Risk Fall Risk  12/30/2018 12/24/2017 10/14/2017 10/14/2017 07/13/2015  Falls in the past year? 0 0 No No No  Number falls in past yr: 0 - - - -  Injury with Fall? 0 - - - -  Risk for fall due to : - Impaired balance/gait - - -  Follow up Falls prevention discussed Falls prevention discussed - - -   Is the patient's home free of loose throw rugs in walkways, pet beds, electrical cords, etc?   yes      Grab bars in the bathroom? no  Handrails on the stairs?   yes      Adequate lighting?   yes   Depression Screen PHQ 2/9 Scores 12/30/2018 12/24/2017 11/26/2017 10/14/2017  PHQ - 2 Score 0 1 1 0  PHQ- 9 Score - 6 6 5      Cognitive Function     6CIT Screen 12/30/2018 12/24/2017  What Year? 0 points 0 points  What month? 0 points 0 points  What time? 0 points 0 points  Count back from 20 0 points 0 points  Months in reverse 0  points 0 points  Repeat phrase 0 points 0 points  Total Score 0 0    Immunization History  Administered Date(s) Administered  . Influenza, High Dose Seasonal PF 10/23/2016, 09/30/2018  . Influenza,inj,Quad PF,6+ Mos 11/10/2015, 10/14/2017  . Influenza-Unspecified 10/23/2016  . Pneumococcal Conjugate-13 07/13/2015  . Pneumococcal Polysaccharide-23 03/25/2012, 05/16/2017  . Td 05/20/2003  . Tdap 01/26/2014  . Zoster 04/25/2010    Screening Tests Health Maintenance  Topic Date Due  . MAMMOGRAM  03/31/2020  . COLONOSCOPY  03/06/2023  . TETANUS/TDAP  01/27/2024  . INFLUENZA VACCINE  Completed  . DEXA SCAN  Completed  . Hepatitis C Screening  Completed  . PNA vac Low Risk Adult  Completed        Plan:  Please schedule your next medicare wellness visit with me in 1 yr.  Ms. Melaragno , Thank you for taking time to come for your Medicare Wellness Visit. I appreciate your ongoing commitment to your health goals. Please review the following plan we discussed and let me know if I can assist you in the future.  Continue doing brain stimulating activities (puzzles, reading, adult coloring books, staying active) to keep memory sharp.  Bring a copy of your living will and/or healthcare power of attorney to your next office visit. Encouraged patient to be more consistent with her eating to try and eat 3 meals a day and increase exercise. Advised to increase water intake to at least 32 ounces a day  These are the goals we discussed: Goals    . Exercise 3x per week (30 min per time)     Exercise like walking at least 3 times a week for 30 minutes at a time to help relieve stress.    . Weight (lb) < 200 lb (90.7 kg)     Would like to loose 30 lbs.       This is a list of the screening recommended for you and due dates:  Health Maintenance  Topic Date Due  . Mammogram  03/31/2020  . Colon Cancer Screening  03/06/2023  . Tetanus Vaccine  01/27/2024  . Flu Shot  Completed  . DEXA scan  (bone density measurement)  Completed  .  Hepatitis C: One time screening is recommended by Center for Disease Control  (CDC) for  adults born from [birth year] through 1966.   Completed  . Pneumonia vaccines  Completed     I have personally reviewed and noted the following in the patient's chart:   . Medical and social history . Use of alcohol, tobacco or illicit drugs  . Current medications and supplements . Functional ability and status . Nutritional status . Physical activity . Advanced directives . List of other physicians . Hospitalizations, surgeries, and ER visits in previous 12 months . Vitals . Screenings to include cognitive, depression, and falls . Referrals and appointments  In addition, I have reviewed and discussed with patient certain preventive protocols, quality  metrics, and best practice recommendations. A written personalized care plan for preventive services as well as general preventive health recommendations were provided to patient.     Joanne Chars, LPN  [id number]

## 2018-12-25 ENCOUNTER — Encounter: Payer: Self-pay | Admitting: Family Medicine

## 2018-12-26 ENCOUNTER — Encounter: Payer: Self-pay | Admitting: Family Medicine

## 2018-12-26 ENCOUNTER — Other Ambulatory Visit: Payer: Self-pay

## 2018-12-26 ENCOUNTER — Ambulatory Visit (INDEPENDENT_AMBULATORY_CARE_PROVIDER_SITE_OTHER): Payer: Medicare Other | Admitting: Family Medicine

## 2018-12-26 VITALS — BP 128/68 | HR 68 | Ht 62.0 in | Wt 189.2 lb

## 2018-12-26 DIAGNOSIS — M7062 Trochanteric bursitis, left hip: Secondary | ICD-10-CM

## 2018-12-26 NOTE — Patient Instructions (Addendum)
Thank you for coming in today.  Do the exercises we discussed.  If not improving we can do PT.  Let me know if not better.    Please perform the exercise program that we have prepared for you and gone over in detail on a daily basis.  In addition to the handout you were provided you can access your program through: [email or web address]   Your unique program code is: [id number]   Hip Bursitis  Hip bursitis is inflammation of a fluid-filled sac (bursa) in the hip joint. The bursa prevents the bones in the hip joint from rubbing against each other. Hip bursitis can cause mild to moderate pain, and symptoms often come and go over time. What are the causes? This condition may be caused by:  Injury to the hip.  Overuse of the muscles that surround the hip joint.  Previous injury or surgery of the hip.  Arthritis or gout.  Diabetes.  Thyroid disease.  Infection. In some cases, the cause may not be known. What are the signs or symptoms? Symptoms of this condition include:  Mild or moderate pain in the hip area. Pain may get worse with movement.  Tenderness and swelling of the hip, especially on the outer side of the hip.  In rare cases, the bursa may become infected. This may cause a fever, as well as warmth and redness in the area. Symptoms may come and go. How is this diagnosed? This condition may be diagnosed based on:  A physical exam.  Your medical history.  X-rays.  Removal of fluid from your inflamed bursa for testing (biopsy). You may be sent to a health care provider who specializes in bone diseases (orthopedist) or a provider who specializes in joint inflammation (rheumatologist). How is this treated? This condition is treated by resting, icing, applying pressure (compression), and raising (elevating) the injured area. This is called RICE treatment. In some cases, this may be enough to make your symptoms go away. Treatment may also include:  Using  crutches.  Draining fluid out of the bursa to help relieve swelling.  Injecting medicine that helps to reduce inflammation (cortisone).  Additional medicines if the bursa is infected. Follow these instructions at home: Managing pain, stiffness, and swelling   If directed, put ice on the painful area. ? Put ice in a plastic bag. ? Place a towel between your skin and the bag. ? Leave the ice on for 20 minutes, 2-3 times a day. ? Raise (elevate) your hip above the level of your heart as much as you can without pain. To do this, try putting a pillow under your hips while you lie down. Activity  Return to your normal activities as told by your health care provider. Ask your health care provider what activities are safe for you.  Rest and protect your hip as much as possible until your pain and swelling get better. General instructions  Take over-the-counter and prescription medicines only as told by your health care provider.  Wear compression wraps only as told by your health care provider.  Do not use your hip to support your body weight until your health care provider says that you can. Use crutches as told by your health care provider.  Gently massage and stretch your injured area as often as is comfortable.  Keep all follow-up visits as told by your health care provider. This is important. How is this prevented?  Exercise regularly, as told by your health care provider.  Warm up and  stretch before being active.  Cool down and stretch after being active.  If an activity irritates your hip or causes pain, avoid the activity as much as possible.  Avoid sitting down for long periods at a time. Contact a health care provider if you:  Have a fever.  Develop new symptoms.  Have difficulty walking or doing everyday activities.  Have pain that gets worse or does not get better with medicine.  Develop red skin or a feeling of warmth in your hip area. Get help right away if  you:  Cannot move your hip.  Have severe pain. Summary  Hip bursitis is inflammation of a fluid-filled sac (bursa) in the hip joint.  Hip bursitis can cause mild to moderate pain, and symptoms often come and go over time.  This condition is treated with rest, ice, compression, elevation, and medicines. This information is not intended to replace advice given to you by your health care provider. Make sure you discuss any questions you have with your health care provider. Document Released: 06/30/2001 Document Revised: 09/16/2017 Document Reviewed: 09/16/2017 Elsevier Patient Education  2020 [place]

## 2018-12-26 NOTE — Progress Notes (Signed)
I, Wendy Poet, LAT, ATC, am serving as scribe for Dr. Lynne Leader.   Christina Hayes is a 68 y.o. female who presents to [facility] today for c/o L hip and L-sided low back pain.  Pt is an established pt of Dr. Georgina Snell and last saw him on 11/26/17 for a well adult visit.  Today, pt reports L hip and L low back pain that has been off and on but worsened over the past week.  She denies any specific MOI.  She locates the pain to her L greater trochanter that she rates as a 5-6/10 and describes as a sharp pain.  She denies any radiating pain into her L thigh.  She denies any L hip mechanical symptoms and denies any numbness/tingling into the L LE.  Pt denies any specific aggravating factors other than going for a walk for exercise.  Pt has tried Tylenol and IcyHot which has helped decrease her pain temporarily.    ROS:  As above  Exam:  BP 128/68 (BP Location: Left Arm, Patient Position: Sitting, Cuff Size: Normal)   Pulse 68   Ht 5\' 2"  (1.575 m)   Wt 189 lb 3.2 oz (85.8 kg)   SpO2 95%   BMI 34.61 kg/m  Wt Readings from Last 5 Encounters:  12/26/18 189 lb 3.2 oz (85.8 kg)  07/08/18 185 lb (83.9 kg)  03/05/18 196 lb (88.9 kg)  02/19/18 196 lb 12.8 oz (89.3 kg)  12/24/17 193 lb (87.5 kg)   General: Well Developed, well nourished, and in no acute distress.  Neuro/Psych: Alert and oriented x3, extra-ocular muscles intact, able to move all 4 extremities, sensation grossly intact. Skin: Warm and dry, no rashes noted.  Respiratory: Not using accessory muscles, speaking in full sentences, trachea midline.  Cardiovascular: Pulses palpable, no extremity edema. Abdomen: Does not appear distended. MSK:  Left hip normal-appearing normal motion. Tender palpation greater trochanter.  Hip abduction strength 4/5.  5/5 hip external rotation internal rotation and adduction.  Right hip normal-appearing normal motion Nontender. Hip abduction strength 4+/5.  5/5 hip external rotation  internal rotation and adduction.  Normal gait.      Assessment and Plan: 68 y.o. female with left lateral hip pain due to trochanteric bursitis/hip abductor tendinopathy.  Plan to treat with home exercise program focusing on hip abductor strengthening and stretching.  Additionally reasonable to try Voltaren gel over-the-counter.  If no improvement patient will notify me and we will proceed with formal physical therapy. Recheck back as needed.   Historical information moved to improve visibility of documentation.  Past Medical History:  Diagnosis Date  . Acid reflux   . Allergic rhinitis, cause unspecified   . Allergy   . Anemia    years ago   . Cataract   . Complication of anesthesia SLOW TO WAKE  . Cough   . DJD (degenerative joint disease)    spine  . Frequency of urination   . H/O hiatal hernia    small  . Head cold   . Headache(784.0)    migraqine- uses essential oils  . HYPERLIPIDEMIA, MIXED 12/03/2006   PT STATES NOT TAKING MED AS PRESCRIBED  . MIGRAINES, HX OF 12/03/2006  . Neuromuscular disorder ([place])   . Nocturia   . OSA on CPAP   . Osteopenia   . PONV (postoperative nausea and vomiting)    in past  . Shortness of breath   . Sleep apnea    cpap  . SUI (stress  urinary incontinence, female)   . Urgency of urination   . VERTIGO, HX OF 12/03/2006   Past Surgical History:  Procedure Laterality Date  . COLONOSCOPY    . PUBOVAGINAL SLING  04/30/2011   Procedure: Gaynelle Arabian;  Surgeon: Bernestine Amass, MD;  Location: [facility];  Service: Urology;  Laterality: N/A;  sub urethral sling    possible ower  . RIGID BRONCHOSCOPY N/A 04/23/2013   Procedure: LASER BRONCHOSCOPY;  Surgeon: Melrose Nakayama, MD;  Location: [facility];  Service: Thoracic;  Laterality: N/A;  . [place]  . VIDEO BRONCHOSCOPY Bilateral 03/26/2013   Procedure: VIDEO BRONCHOSCOPY WITHOUT FLUORO;  Surgeon: Tanda Rockers, MD;  Location: [facility];  Service: Cardiopulmonary;  Laterality: Bilateral;  . VIDEO BRONCHOSCOPY N/A 04/23/2013   Procedure: VIDEO BRONCHOSCOPY;  Surgeon: Melrose Nakayama, MD;  Location: [facility];  Service: Thoracic;  Laterality: N/A;  . VIDEO BRONCHOSCOPY N/A 05/28/2014   Procedure: VIDEO BRONCHOSCOPY;  Surgeon: Melrose Nakayama, MD;  Location: [facility];  Service: Thoracic;  Laterality: N/A;   Social History   Tobacco Use  . Smoking status: Never Smoker  . Smokeless tobacco: Never Used  Substance Use Topics  . Alcohol use: No   family history includes Allergies in her daughter; Alzheimer's disease in her father; Breast cancer in her daughter and maternal grandmother; Cancer in her maternal grandmother and mother; Cancer (age of onset: 6) in her daughter; Colon cancer in her mother; Colon polyps in her brother; Diabetes in her father and mother; Hyperlipidemia in her father.  Medications: Current Outpatient Medications  Medication Sig Dispense Refill  . acetaminophen (TYLENOL ARTHRITIS PAIN) 650 MG CR tablet Take 650 mg by mouth every 8 (eight) hours as needed for pain.    Marland Kitchen atorvastatin (LIPITOR) 40 MG tablet Take 1 tablet (40 mg total) by mouth daily. 90 tablet 1  . Calcium Carb-Cholecalciferol (SM CALCIUM/VITAMIN D) 600-800 MG-UNIT TABS Take by mouth.    . cetirizine (ZYRTEC) 10 MG tablet Take 1 tablet (10 mg total) by mouth daily. (Patient taking differently: Take 10 mg by mouth daily as needed. ) 90 tablet 3  . Cholecalciferol (VITAMIN D PO) Take 800 Units by mouth daily.    . furosemide (LASIX) 20 MG tablet Take 1 tablet (20 mg total) by mouth daily. MUST MAKE OFFICE VISIT 30 tablet 0  . gabapentin (NEURONTIN) 300 MG capsule TAKE 1 CAPSULE BY MOUTH AT  BEDTIME 90 capsule 1  . pantoprazole (PROTONIX) 40 MG tablet TAKE 1 TABLET BY MOUTH  DAILY 90 tablet 3  . triamcinolone cream (KENALOG) 0.1 % Apply 1 application topically 2 (two) times daily. 453.6 g 12  . zolpidem  (AMBIEN) 10 MG tablet Take 1 tablet (10 mg total) by mouth at bedtime as needed for sleep. (Patient not taking: Reported on 12/26/2018) 90 tablet 1   No current facility-administered medications for this visit.    Allergies  Allergen Reactions  . Codeine Itching    *Can take Hydrocodone*  . Tramadol     headache  . Protriptyline Palpitations  . Vivactil [Protriptyline Hcl] Palpitations      Discussed warning signs or symptoms. Please see discharge instructions. Patient expresses understanding.  The above documentation has been reviewed and is accurate and complete Lynne Leader

## 2018-12-30 ENCOUNTER — Ambulatory Visit (INDEPENDENT_AMBULATORY_CARE_PROVIDER_SITE_OTHER): Payer: Medicare Other | Admitting: *Deleted

## 2018-12-30 VITALS — BP 104/59 | HR 67 | Temp 97.1°F | Ht 62.0 in | Wt 183.0 lb

## 2018-12-30 DIAGNOSIS — Z0000 Encounter for general adult medical examination without abnormal findings: Secondary | ICD-10-CM

## 2018-12-30 NOTE — Patient Instructions (Addendum)
Please schedule your next medicare wellness visit with me in 1 yr.  Christina Hayes , Thank you for taking time to come for your Medicare Wellness Visit. I appreciate your ongoing commitment to your health goals. Please review the following plan we discussed and let me know if I can assist you in the future.  Continue doing brain stimulating activities (puzzles, reading, adult coloring books, staying active) to keep memory sharp.  Bring a copy of your living will and/or healthcare power of attorney to your next office visit. These are the goals we discussed: Goals    . Exercise 3x per week (30 min per time)     Exercise like walking at least 3 times a week for 30 minutes at a time to help relieve stress.    . Weight (lb) < 200 lb (90.7 kg)     Would like to loose 30 lbs.

## 2018-12-31 ENCOUNTER — Other Ambulatory Visit: Payer: Self-pay | Admitting: Family Medicine

## 2018-12-31 DIAGNOSIS — K219 Gastro-esophageal reflux disease without esophagitis: Secondary | ICD-10-CM

## 2019-01-06 ENCOUNTER — Other Ambulatory Visit: Payer: Self-pay | Admitting: Neurology

## 2019-01-09 ENCOUNTER — Other Ambulatory Visit: Payer: Self-pay | Admitting: Family Medicine

## 2019-01-23 ENCOUNTER — Other Ambulatory Visit: Payer: Self-pay | Admitting: Sports Medicine

## 2019-03-09 DIAGNOSIS — R208 Other disturbances of skin sensation: Secondary | ICD-10-CM | POA: Diagnosis not present

## 2019-03-09 DIAGNOSIS — L821 Other seborrheic keratosis: Secondary | ICD-10-CM | POA: Diagnosis not present

## 2019-03-09 DIAGNOSIS — L918 Other hypertrophic disorders of the skin: Secondary | ICD-10-CM | POA: Diagnosis not present

## 2019-03-09 DIAGNOSIS — D3617 Benign neoplasm of peripheral nerves and autonomic nervous system of trunk, unspecified: Secondary | ICD-10-CM | POA: Diagnosis not present

## 2019-03-09 DIAGNOSIS — D485 Neoplasm of uncertain behavior of skin: Secondary | ICD-10-CM | POA: Diagnosis not present

## 2019-03-09 DIAGNOSIS — L820 Inflamed seborrheic keratosis: Secondary | ICD-10-CM | POA: Diagnosis not present

## 2019-04-01 ENCOUNTER — Other Ambulatory Visit: Payer: Self-pay | Admitting: Family Medicine

## 2019-04-01 DIAGNOSIS — Z1231 Encounter for screening mammogram for malignant neoplasm of breast: Secondary | ICD-10-CM

## 2019-04-13 ENCOUNTER — Ambulatory Visit (INDEPENDENT_AMBULATORY_CARE_PROVIDER_SITE_OTHER): Payer: Medicare Other | Admitting: Family Medicine

## 2019-04-13 ENCOUNTER — Other Ambulatory Visit: Payer: Self-pay

## 2019-04-13 ENCOUNTER — Encounter: Payer: Self-pay | Admitting: Family Medicine

## 2019-04-13 VITALS — BP 125/75 | HR 67 | Temp 98.2°F | Ht 62.0 in | Wt 197.0 lb

## 2019-04-13 DIAGNOSIS — E782 Mixed hyperlipidemia: Secondary | ICD-10-CM | POA: Diagnosis not present

## 2019-04-13 DIAGNOSIS — K219 Gastro-esophageal reflux disease without esophagitis: Secondary | ICD-10-CM | POA: Diagnosis not present

## 2019-04-13 DIAGNOSIS — R635 Abnormal weight gain: Secondary | ICD-10-CM | POA: Diagnosis not present

## 2019-04-13 DIAGNOSIS — F5101 Primary insomnia: Secondary | ICD-10-CM

## 2019-04-13 DIAGNOSIS — G4700 Insomnia, unspecified: Secondary | ICD-10-CM | POA: Insufficient documentation

## 2019-04-13 MED ORDER — GABAPENTIN 300 MG PO CAPS: ORAL_CAPSULE | ORAL | 3 refills | Status: DC

## 2019-04-13 MED ORDER — FUROSEMIDE 20 MG PO TABS: 20.0000 mg | ORAL_TABLET | Freq: Every day | ORAL | 2 refills | Status: DC

## 2019-04-13 NOTE — Assessment & Plan Note (Signed)
She is tolerating lipitor at current dose.  Recommend continuation and update lipid panel today.

## 2019-04-13 NOTE — Patient Instructions (Signed)
Great to meet you today! Please have labs completed. We'll be in touch with results.  See me again in 6 months or sooner if needed.

## 2019-04-13 NOTE — Progress Notes (Signed)
Christina Hayes - 69 y.o. female MRN [id number]  Date of birth: Nov 23, 1950  Subjective Chief Complaint  Patient presents with  . Follow-up    HPI Christina Hayes is a 69 y.o. female with a history of HLD, asthma, GERD, carcinoid s/p endobronchial resection and insomnia here today for follow up visit.  She does have some concerns about weight gain.  She is working on making changes to lifestyle.   -HLD: Current tx with atorvastain.  She is tolerating well.  Last lipid panel 09/2017.    -GERD:  Well controlled with protonix.  Denies abdominal pain or nausea.   -Insomnia:  She has had difficulty with insomnia.  She has tried [place] previously but had side effects with this.  She is currently taking gabapentin.  Current dose of 600mg  at bedtime is working well for her.  She also takes 300mg  during the day to help with occasional back and joint pain.  She needs number per month increased.    ROS:  A comprehensive ROS was completed and negative except as noted per HPI   Allergies  Allergen Reactions  . Trazodone And Nefazodone Other (See Comments)    Nausea, fatique and sweating  . Codeine Itching    *Can take Hydrocodone*  . Protriptyline Palpitations  . Tramadol Other (See Comments)    headache  . Vivactil [Protriptyline Hcl] Palpitations    Past Medical History:  Diagnosis Date  . Acid reflux   . Allergic rhinitis, cause unspecified   . Allergy   . Anemia    years ago   . Cataract   . Complication of anesthesia SLOW TO WAKE  . Cough   . DJD (degenerative joint disease)    spine  . Frequency of urination   . H/O hiatal hernia    small  . Head cold   . Headache(784.0)    migraqine- uses essential oils  . HYPERLIPIDEMIA, MIXED 12/03/2006   PT STATES NOT TAKING MED AS PRESCRIBED  . MIGRAINES, HX OF 12/03/2006  . Neuromuscular disorder ([place])   . Nocturia   . OSA on CPAP   . Osteopenia   . PONV (postoperative nausea and vomiting)    in past  . Shortness of breath   .  Sleep apnea    cpap  . SUI (stress urinary incontinence, female)   . Urgency of urination   . VERTIGO, HX OF 12/03/2006    Past Surgical History:  Procedure Laterality Date  . COLONOSCOPY    . PUBOVAGINAL SLING  04/30/2011   Procedure: Gaynelle Arabian;  Surgeon: Bernestine Amass, MD;  [facility];  Service: Urology;  Laterality: N/A;  sub urethral sling    possible ower  . RIGID BRONCHOSCOPY N/A 04/23/2013   Procedure: LASER BRONCHOSCOPY;  Surgeon: Melrose Nakayama, MD;  Location: [facility];  Service: Thoracic;  Laterality: N/A;  . [place]  . VIDEO BRONCHOSCOPY Bilateral 03/26/2013   Procedure: VIDEO BRONCHOSCOPY WITHOUT FLUORO;  Surgeon: Tanda Rockers, MD;  [facility];  Service: Cardiopulmonary;  Laterality: Bilateral;  . VIDEO BRONCHOSCOPY N/A 04/23/2013   Procedure: VIDEO BRONCHOSCOPY;  Surgeon: Melrose Nakayama, MD;  Location: [place];  Service: Thoracic;  Laterality: N/A;  . VIDEO BRONCHOSCOPY N/A 05/28/2014   Procedure: VIDEO BRONCHOSCOPY;  Surgeon: Melrose Nakayama, MD;  Location: [place];  Service: Thoracic;  Laterality: N/A;    Social History   Socioeconomic History  .  Marital status: Married    Spouse name: Alvester Chou  . Number of children: 1  . Years of education: 73  . Highest education level: 12th grade  Occupational History  . Occupation: Retired    Comment: Emergency planning/management officer  Tobacco Use  . Smoking status: Never Smoker  . Smokeless tobacco: Never Used  Substance and Sexual Activity  . Alcohol use: No  . Drug use: No  . Sexual activity: Not Currently    Partners: Male    Birth control/protection: Surgical  Other Topics Concern  . 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 Gets  on computer during the day. Checks emails. Plays games on computer, reads and shops online. Sees her daughter occasionally due to COVID   Social Determinants of Health   Financial Resource Strain:   . Difficulty of Paying Living Expenses:   Food Insecurity:   . Worried About Charity fundraiser in the Last Year:   . Arboriculturist in the Last Year:   Transportation Needs:   . Film/video editor (Medical):   Marland Kitchen Lack of Transportation (Non-Medical):   Physical Activity:   . Days of Exercise per Week:   . Minutes of Exercise per Session:   Stress: No Stress Concern Present  . Feeling of Stress : Not at all  Social Connections:   . Frequency of Communication with Friends and Family:   . Frequency of Social Gatherings with Friends and Family:   . Attends Religious Services:   . Active Member of Clubs or Organizations:   . Attends Archivist Meetings:   Marland Kitchen Marital Status:     Family History  Problem Relation Age of Onset  . Cancer Mother        Colon Cancer survivor  . Diabetes Mother   . Colon cancer Mother   . Diabetes Father   . Hyperlipidemia Father   . Alzheimer's disease Father   . Cancer Maternal Grandmother        Breast Cancer  . Breast cancer Maternal Grandmother   . Cancer Daughter 3       stage III-B breast CA  . Breast cancer Daughter   . Colon polyps Brother   . Allergies Daughter   . Esophageal cancer Neg Hx   . Rectal cancer Neg Hx   . Stomach cancer Neg Hx     Health Maintenance  Topic Date Due  . MAMMOGRAM  03/31/2020  . COLONOSCOPY  03/06/2023  . TETANUS/TDAP  01/27/2024  . INFLUENZA VACCINE  Completed  . DEXA SCAN  Completed  . Hepatitis C Screening  Completed  . PNA vac Low Risk Adult  Completed     ----------------------------------------------------------------------------------------------------------------------------------------------------------------------------------------------------------------- Physical Exam BP  125/75   Pulse 67   Temp 98.2 F (36.8 C) (Oral)   Ht 5\' 2"  (1.575 m)   Wt 197 lb (89.4 kg)   BMI 36.03 kg/m   Physical Exam Constitutional:      Appearance: Normal appearance.  HENT:     Head: Normocephalic and atraumatic.  Eyes:     General: No scleral icterus. Cardiovascular:     Rate and Rhythm: Normal rate and regular rhythm.  Pulmonary:     Effort: Pulmonary effort is normal.  Breath sounds: Normal breath sounds.  Musculoskeletal:     Cervical back: Neck supple.  Skin:    General: Skin is warm and dry.  Neurological:     General: No focal deficit present.     Mental Status: She is alert.  Psychiatric:        Mood and Affect: Mood normal.        Behavior: Behavior normal.     ------------------------------------------------------------------------------------------------------------------------------------------------------------------------------------------------------------------- Assessment and Plan  HLD (hyperlipidemia) She is tolerating lipitor at current dose.  Recommend continuation and update lipid panel today.   Weight gain Check TSH and glucose levels today.  She is working on lifestyle change to help with weight loss.   GERD (gastroesophageal reflux disease) Well controlled with pantoprazole, continue at current strength   Insomnia Well controlled with gabapentin 600mg  qhs.   Continue at current strength and dosing. Will update Rx for current dosing.    Meds ordered this encounter  Medications  . gabapentin (NEURONTIN) 300 MG capsule    Sig: Take 1 tablet during the day and 2 tablets at bedtime    Dispense:  270 capsule    Refill:  3    Requesting 1 year supply  . furosemide (LASIX) 20 MG tablet    Sig: Take 1 tablet (20 mg total) by mouth daily.    Dispense:  90 tablet    Refill:  2    Requesting 1 year supply    Return in about 6 months (around 10/14/2019) for Insomnia/HLD.    This visit occurred during the SARS-CoV-2 public  health emergency.  Safety protocols were in place, including screening questions prior to the visit, additional usage of staff PPE, and extensive cleaning of exam room while observing appropriate contact time as indicated for disinfecting solutions.

## 2019-04-13 NOTE — Assessment & Plan Note (Addendum)
Well controlled with pantoprazole, continue at current strength

## 2019-04-13 NOTE — Assessment & Plan Note (Signed)
Check TSH and glucose levels today.  She is working on lifestyle change to help with weight loss.

## 2019-04-13 NOTE — Assessment & Plan Note (Signed)
Well controlled with gabapentin 600mg  qhs.   Continue at current strength and dosing. Will update Rx for current dosing.

## 2019-04-17 ENCOUNTER — Ambulatory Visit
Admission: RE | Admit: 2019-04-17 | Discharge: 2019-04-17 | Disposition: A | Discharge status: Home / Self Care | Payer: Medicare Other | Source: Ambulatory Visit | Attending: Family Medicine | Admitting: Family Medicine

## 2019-04-17 ENCOUNTER — Other Ambulatory Visit: Payer: Self-pay

## 2019-04-17 DIAGNOSIS — Z1231 Encounter for screening mammogram for malignant neoplasm of breast: Secondary | ICD-10-CM | POA: Diagnosis not present

## 2019-04-28 ENCOUNTER — Encounter: Payer: Self-pay | Admitting: Family Medicine

## 2019-04-30 DIAGNOSIS — E782 Mixed hyperlipidemia: Secondary | ICD-10-CM | POA: Diagnosis not present

## 2019-05-01 LAB — CBC
Hematocrit: 37.1 % (ref 34.0–46.6)
Hemoglobin: 12.2 g/dL (ref 11.1–15.9)
MCH: 27.4 pg (ref 26.6–33.0)
MCHC: 32.9 g/dL (ref 31.5–35.7)
MCV: 83 fL (ref 79–97)
Platelets: 372 10*3/uL (ref 150–450)
RBC: 4.46 x10E6/uL (ref 3.77–5.28)
RDW: 13.3 % (ref 11.7–15.4)
WBC: 6.3 10*3/uL (ref 3.4–10.8)

## 2019-05-01 LAB — LIPID PANEL
Chol/HDL Ratio: 4.5 ratio — ABNORMAL HIGH (ref 0.0–4.4)
Cholesterol, Total: 286 mg/dL — ABNORMAL HIGH (ref 100–199)
HDL: 63 mg/dL (ref >39)
LDL Chol Calc (NIH): 197 mg/dL — ABNORMAL HIGH (ref 0–99)
Triglycerides: 143 mg/dL (ref 0–149)
VLDL Cholesterol Cal: 26 mg/dL (ref 5–40)

## 2019-05-01 LAB — TSH: TSH: 2.77 u[IU]/mL (ref 0.450–4.500)

## 2019-05-01 LAB — COMPREHENSIVE METABOLIC PANEL
ALT: 54 IU/L — ABNORMAL HIGH (ref 0–32)
AST: 59 IU/L — ABNORMAL HIGH (ref 0–40)
Albumin/Globulin Ratio: 1.7 (ref 1.2–2.2)
Albumin: 4.3 g/dL (ref 3.8–4.8)
Alkaline Phosphatase: 181 IU/L — ABNORMAL HIGH (ref 39–117)
BUN/Creatinine Ratio: 14 (ref 12–28)
BUN: 13 mg/dL (ref 8–27)
Bilirubin Total: 0.3 mg/dL (ref 0.0–1.2)
CO2: 23 mmol/L (ref 20–29)
Calcium: 9.5 mg/dL (ref 8.7–10.3)
Chloride: 104 mmol/L (ref 96–106)
Creatinine, Ser: 0.95 mg/dL (ref 0.57–1.00)
GFR calc Af Amer: 70 mL/min/{1.73_m2} (ref >59)
GFR calc non Af Amer: 61 mL/min/{1.73_m2} (ref >59)
Globulin, Total: 2.6 g/dL (ref 1.5–4.5)
Glucose: 119 mg/dL — ABNORMAL HIGH (ref 65–99)
Potassium: 4.1 mmol/L (ref 3.5–5.2)
Sodium: 140 mmol/L (ref 134–144)
Total Protein: 6.9 g/dL (ref 6.0–8.5)

## 2019-05-06 ENCOUNTER — Encounter: Payer: Self-pay | Admitting: Family Medicine

## 2019-05-07 ENCOUNTER — Other Ambulatory Visit: Payer: Self-pay | Admitting: Family Medicine

## 2019-05-07 DIAGNOSIS — R748 Abnormal levels of other serum enzymes: Secondary | ICD-10-CM

## 2019-05-07 NOTE — Progress Notes (Signed)
Pt has seen results on MyChart and she has responded that she is willing to change to the generic of Crestor for her Cholesterol. I have placed the orders for her to get the blood work checked in 6 weeks after starting the Crestor. Please advise.

## 2019-05-08 ENCOUNTER — Other Ambulatory Visit: Payer: Self-pay | Admitting: Family Medicine

## 2019-05-08 MED ORDER — ROSUVASTATIN CALCIUM 20 MG PO TABS: 20.0000 mg | ORAL_TABLET | Freq: Every day | ORAL | 3 refills | Status: DC

## 2019-05-13 ENCOUNTER — Encounter: Payer: Self-pay | Admitting: Family Medicine

## 2019-05-13 MED ORDER — ROSUVASTATIN CALCIUM 20 MG PO TABS: 20.0000 mg | ORAL_TABLET | Freq: Every day | ORAL | 3 refills | Status: DC

## 2019-07-13 ENCOUNTER — Other Ambulatory Visit: Payer: Self-pay | Admitting: Thoracic Surgery (Cardiothoracic Vascular Surgery)

## 2019-07-13 DIAGNOSIS — D3A00 Benign carcinoid tumor of unspecified site: Secondary | ICD-10-CM

## 2019-07-13 NOTE — Progress Notes (Unsigned)
cxr 

## 2019-07-14 ENCOUNTER — Other Ambulatory Visit: Payer: Self-pay

## 2019-07-14 ENCOUNTER — Ambulatory Visit: Payer: Medicare Other | Admitting: Thoracic Surgery (Cardiothoracic Vascular Surgery)

## 2019-07-14 ENCOUNTER — Ambulatory Visit
Admission: RE | Admit: 2019-07-14 | Discharge: 2019-07-14 | Disposition: A | Discharge status: Home / Self Care | Payer: Medicare Other | Source: Ambulatory Visit | Attending: Thoracic Surgery (Cardiothoracic Vascular Surgery) | Admitting: Thoracic Surgery (Cardiothoracic Vascular Surgery)

## 2019-07-14 ENCOUNTER — Encounter: Payer: Self-pay | Admitting: Thoracic Surgery (Cardiothoracic Vascular Surgery)

## 2019-07-14 VITALS — BP 129/77 | HR 72 | Temp 97.4°F | Resp 20 | Ht 62.0 in | Wt 200.0 lb

## 2019-07-14 DIAGNOSIS — J939 Pneumothorax, unspecified: Secondary | ICD-10-CM | POA: Diagnosis not present

## 2019-07-14 DIAGNOSIS — D3A00 Benign carcinoid tumor of unspecified site: Secondary | ICD-10-CM

## 2019-07-14 DIAGNOSIS — J90 Pleural effusion, not elsewhere classified: Secondary | ICD-10-CM | POA: Diagnosis not present

## 2019-07-14 DIAGNOSIS — C7A090 Malignant carcinoid tumor of the bronchus and lung: Secondary | ICD-10-CM

## 2019-07-14 NOTE — Progress Notes (Signed)
[address]       [place], [address]             [phone]       HPI: Christina Hayes returns for a scheduled follow-up visit  Christina Hayes is a 69 year old woman with history of hyperlipidemia, obstructive sleep apnea, migraines, hiatal hernia, reflux, and the left mainstem bronchial carcinoid tumor.  We did endobronchial resection and laser ablation in April 2015.  She has been followed with CT scans since then.  Last year she was at 5years with no evidence of recurrence.  Over the past year she has been doing well.  She has not had any recurrent wheezing or shortness of breath.  Past Medical History:  Diagnosis Date   Acid reflux    Allergic rhinitis, cause unspecified    Allergy    Anemia    years ago    Cataract    Complication of anesthesia SLOW TO WAKE   Cough    DJD (degenerative joint disease)    spine   Frequency of urination    H/O hiatal hernia    small   Head cold    Headache(784.0)    migraqine- uses essential oils   HYPERLIPIDEMIA, MIXED 12/03/2006   PT STATES NOT TAKING MED AS PRESCRIBED   MIGRAINES, HX OF 12/03/2006   Neuromuscular disorder (HCC)    Nocturia    OSA on CPAP    Osteopenia    PONV (postoperative nausea and vomiting)    in past   Shortness of breath    Sleep apnea    cpap   SUI (stress urinary incontinence, female)    Urgency of urination    VERTIGO, HX OF 12/03/2006    Current Outpatient Medications  Medication Sig Dispense Refill   acetaminophen (TYLENOL ARTHRITIS PAIN) 650 MG CR tablet Take 650 mg by mouth every 8 (eight) hours as needed for pain.     cetirizine (ZYRTEC) 10 MG tablet Take 1 tablet (10 mg total) by mouth daily. (Patient taking differently: Take 10 mg by mouth daily as needed. ) 90 tablet 3   Cholecalciferol (VITAMIN D PO) Take 800 Units by mouth daily.     furosemide (LASIX) 20 MG tablet Take 1 tablet (20 mg total) by mouth daily. 90 tablet 2   gabapentin  (NEURONTIN) 300 MG capsule Take 1 tablet during the day and 2 tablets at bedtime 270 capsule 3   pantoprazole (PROTONIX) 40 MG tablet TAKE 1 TABLET BY MOUTH  DAILY 90 tablet 3   rosuvastatin (CRESTOR) 20 MG tablet Take 1 tablet (20 mg total) by mouth daily. 90 tablet 3   triamcinolone cream (KENALOG) 0.1 % Apply 1 application topically 2 (two) times daily. 453.6 g 12   No current facility-administered medications for this visit.    Physical Exam BP 129/77 (BP Location: Right Arm, Patient Position: Sitting, Cuff Size: Large)    Pulse 72    Temp (!) 97.4 F (36.3 C) (Skin)    Resp 20    Ht 5\' 2"  (1.575 m)    Wt 200 lb (90.7 kg)    SpO2 96% Comment: ra   BMI 36.58 kg/m  Obese 69 year old woman in no acute distress Alert and oriented x3 with no focal deficits No cervical or supraclavicular adenopathy Lungs clear with equal breath sounds bilaterally Cardiac regular rate and rhythm  Diagnostic Tests: CHEST - 2 VIEW  COMPARISON:  05/25/2014  FINDINGS: The lungs are clear without  focal pneumonia, edema, pneumothorax or pleural effusion. Interstitial markings are diffusely coarsened with chronic features. Cardiopericardial silhouette is at upper limits of normal for size. The visualized bony structures of the thorax are intact.  IMPRESSION: No acute cardiopulmonary findings.   Electronically Signed   By: Misty Stanley M.D.   On: 07/14/2019 12:19 I personally reviewed the chest x-ray images and concur with the findings noted above  Impression: Christina Hayes is a 69 year old woman with a history of a carcinoid tumor of the left mainstem bronchus.  She originally presented with recurrent pulmonary infections and wheezing.  She had an endobronchial resection and laser ablation in 2015.  She continues to do well from a pulmonary standpoint.  She is not had any recurrence of wheezing or recurrent infections.  She is otherwise doing well.  Her chest x-ray today shows no active  disease.  This was a low-grade carcinoid tumor.  She is over 5 years out, but there is always some risk of recurrence.  Recommended we do a CT next year.  Plan: Return in 1 year with CT chest  Melrose Nakayama, MD [facility] [phone]

## 2019-09-11 ENCOUNTER — Other Ambulatory Visit: Payer: Self-pay

## 2019-09-11 ENCOUNTER — Emergency Department (INDEPENDENT_AMBULATORY_CARE_PROVIDER_SITE_OTHER)
Admission: RE | Admit: 2019-09-11 | Discharge: 2019-09-11 | Disposition: A | Discharge status: Home / Self Care | Payer: Medicare Other | Source: Ambulatory Visit | Attending: Family Medicine | Admitting: Family Medicine

## 2019-09-11 VITALS — BP 149/77 | HR 75 | Temp 98.0°F | Resp 17

## 2019-09-11 DIAGNOSIS — Z9189 Other specified personal risk factors, not elsewhere classified: Secondary | ICD-10-CM

## 2019-09-11 LAB — POC SARS CORONAVIRUS 2 AG -  ED: SARS Coronavirus 2 Ag: NEGATIVE

## 2019-09-11 NOTE — ED Triage Notes (Signed)
Pt here today for covid testing. No known exposure. Headache 2 days ago, sore throat started last night, some diarrhea yesterday which has resolved. Denies fever and SOB. Husband is a liver transplant pt.  Has had both covid vaccinations.

## 2019-09-11 NOTE — Discharge Instructions (Addendum)
If cold-like symptoms develop, try the following: Take plain guaifenesin (1200mg  extended release tabs such as Mucinex) twice daily, with plenty of water, for cough and congestion.  May add Pseudoephedrine (30mg , one or two every 4 to 6 hours) for sinus congestion.  Get adequate rest.   May use Afrin nasal spray (or generic oxymetazoline) each morning for about 5 days and then discontinue.  Also recommend using saline nasal spray several times daily and saline nasal irrigation (AYR is a common brand).  Use Flonase nasal spray each morning after using Afrin nasal spray and saline nasal irrigation. Try warm salt water gargles for sore throat.  Stop all antihistamines for now, and other non-prescription cough/cold preparations. May take Ibuprofen 200mg , 4 tabs every 8 hours with food for fever, body aches, etc. May take Delsym Cough Suppressant ("12 Hour Cough Relief") at bedtime for nighttime cough.   Isolate yourself until COVID-19 test result is available.   If your COVID19 test is positive, then you are infected with the novel coronavirus and could give the virus to others.  Please continue isolation at home for at least 10 days since the start of your symptoms.  Once you complete your 10 day quarantine, you may return to normal activities as long as you've not had a fever for over 24 hours (without taking fever reducing medicine) and your symptoms are improving. Please continue good preventive care measures, including:  frequent hand-washing, avoid touching your face, cover coughs/sneezes, stay out of crowds and keep a 6 foot distance from others.  Go to the nearest hospital emergency room if fever/cough/breathlessness are severe or illness seems like a threat to life.

## 2019-09-11 NOTE — ED Provider Notes (Signed)
[facility]    CSN: [id number] Arrival date & time: 09/11/19  1351      History   Chief Complaint Chief Complaint  Patient presents with  . Appointment    2pm  . Sore Throat  . Nasal Congestion    Runny nose    HPI Christina Hayes is a 69 y.o. female.   Patient presents for COVID19 testing.  Two days ago she developed a headache, mild sore throat, and some loose stools.  She denies cough, nasal congestion, and fever.  She denies chest tightness, shortness of breath, and changes in taste/smell.    The history is provided by the patient.    Past Medical History:  Diagnosis Date  . Acid reflux   . Allergic rhinitis, cause unspecified   . Allergy   . Anemia    years ago   . Cataract   . Complication of anesthesia SLOW TO WAKE  . Cough   . DJD (degenerative joint disease)    spine  . Frequency of urination   . H/O hiatal hernia    small  . Head cold   . Headache(784.0)    migraqine- uses essential oils  . HYPERLIPIDEMIA, MIXED 12/03/2006   PT STATES NOT TAKING MED AS PRESCRIBED  . MIGRAINES, HX OF 12/03/2006  . Neuromuscular disorder ([place])   . Nocturia   . OSA on CPAP   . Osteopenia   . PONV (postoperative nausea and vomiting)    in past  . Shortness of breath   . Sleep apnea    cpap  . SUI (stress urinary incontinence, female)   . Urgency of urination   . VERTIGO, HX OF 12/03/2006    Patient Active Problem List   Diagnosis Date Noted  . Weight gain 04/13/2019  . GERD (gastroesophageal reflux disease) 04/13/2019  . Insomnia 04/13/2019  . DJD (degenerative joint disease) of knee 09/06/2015  . Degenerative tear of medial meniscus 09/06/2015  . Severe sleep apnea 07/21/2015  . Osteopenia determined by x-ray 07/20/2015  . Lumbago 06/15/2015  . Left knee pain 06/10/2015  . Carcinoid tumor 05/18/2014  . Sleep pattern disturbance 01/26/2014  . Cough variant asthma 02/06/2013  . Obesity, unspecified 03/25/2012  . Asthma, chronic 04/04/2010    . HLD (hyperlipidemia) 12/03/2006  . VERTIGO, HX OF 12/03/2006  . MIGRAINES, HX OF 12/03/2006    Past Surgical History:  Procedure Laterality Date  . COLONOSCOPY    . PUBOVAGINAL SLING  04/30/2011   Procedure: Gaynelle Arabian;  Surgeon: Bernestine Amass, MD;  [facility];  Service: Urology;  Laterality: N/A;  sub urethral sling    possible ower  . RIGID BRONCHOSCOPY N/A 04/23/2013   Procedure: LASER BRONCHOSCOPY;  Surgeon: Melrose Nakayama, MD;  Location: [facility];  Service: Thoracic;  Laterality: N/A;  . [place]  . VIDEO BRONCHOSCOPY Bilateral 03/26/2013   Procedure: VIDEO BRONCHOSCOPY WITHOUT FLUORO;  Surgeon: Tanda Rockers, MD;  Location: [facility];  Service: Cardiopulmonary;  Laterality: Bilateral;  . VIDEO BRONCHOSCOPY N/A 04/23/2013   Procedure: VIDEO BRONCHOSCOPY;  Surgeon: Melrose Nakayama, MD;  Location: [facility];  Service: Thoracic;  Laterality: N/A;  . VIDEO BRONCHOSCOPY N/A 05/28/2014   Procedure: VIDEO BRONCHOSCOPY;  Surgeon: Melrose Nakayama, MD;  Location: [facility];  Service: Thoracic;  Laterality: N/A;    OB History   No obstetric history on file.      Home  Medications    Prior to Admission medications   Medication Sig Start Date End Date Taking? Authorizing Provider  acetaminophen (TYLENOL ARTHRITIS PAIN) 650 MG CR tablet Take 650 mg by mouth every 8 (eight) hours as needed for pain.    [provider]  cetirizine (ZYRTEC) 10 MG tablet Take 1 tablet (10 mg total) by mouth daily. Patient taking differently: Take 10 mg by mouth daily as needed.  04/19/15   Robyn Haber, MD  Cholecalciferol (VITAMIN D PO) Take 800 Units by mouth daily.    [provider]  furosemide (LASIX) 20 MG tablet Take 1 tablet (20 mg total) by mouth daily. 04/13/19   [name], DO  gabapentin (NEURONTIN) 300 MG capsule Take 1 tablet during the day and 2 tablets at bedtime 04/13/19    [name], DO  pantoprazole (PROTONIX) 40 MG tablet TAKE 1 TABLET BY MOUTH  DAILY 01/01/19   Hali Marry, MD  rosuvastatin (CRESTOR) 20 MG tablet Take 1 tablet (20 mg total) by mouth daily. 05/13/19   [name], DO  triamcinolone cream (KENALOG) 0.1 % Apply 1 application topically 2 (two) times daily. 10/30/18   Gregor Hams, MD    Family History Family History  Problem Relation Age of Onset  . Cancer Mother        Colon Cancer survivor  . Diabetes Mother   . Colon cancer Mother   . Diabetes Father   . Hyperlipidemia Father   . Alzheimer's disease Father   . Cancer Maternal Grandmother        Breast Cancer  . Breast cancer Maternal Grandmother   . Cancer Daughter 62       stage III-B breast CA  . Breast cancer Daughter   . Colon polyps Brother   . Allergies Daughter   . Esophageal cancer Neg Hx   . Rectal cancer Neg Hx   . Stomach cancer Neg Hx     Social History Social History   Tobacco Use  . Smoking status: Never Smoker  . Smokeless tobacco: Never Used  Vaping Use  . Vaping Use: Never used  Substance Use Topics  . Alcohol use: No  . Drug use: No     Allergies   Trazodone and nefazodone, Codeine, Protriptyline, Tramadol, and Vivactil [protriptyline hcl]   Review of Systems Review of Systems + sore throat No cough No pleuritic pain No wheezing No nasal congestion No post-nasal drainage No sinus pain/pressure No itchy/red eyes No earache No hemoptysis No SOB No fever/chills No nausea No vomiting No abdominal pain + diarrhea No urinary symptoms No skin rash No fatigue No myalgias + headache   Physical Exam Triage Vital Signs ED Triage Vitals  Enc Vitals Group     BP 09/11/19 1407 (!) 149/77     Pulse Rate 09/11/19 1407 75     Resp 09/11/19 1407 17     Temp 09/11/19 1407 98 F (36.7 C)     Temp Source 09/11/19 1407 Oral     SpO2 09/11/19 1407 95 %     Weight --      Height --      Head Circumference --      Peak  Flow --      Pain Score 09/11/19 1408 0     Pain Loc --      Pain Edu? --      Excl. in [place]? --    No data found.  Updated Vital Signs BP (!) 149/77 (  BP Location: Right Arm)   Pulse 75   Temp 98 F (36.7 C) (Oral)   Resp 17   SpO2 95%   Visual Acuity Right Eye Distance:   Left Eye Distance:   Bilateral Distance:    Right Eye Near:   Left Eye Near:    Bilateral Near:     Physical Exam Nursing notes and Vital Signs reviewed. Appearance:  Patient appears stated age, and in no acute distress Eyes:  Pupils are equal, round, and reactive to light and accomodation.  Extraocular movement is intact.  Conjunctivae are not inflamed  Ears:  Canals normal.  Tympanic membranes normal.  Nose:  Mildly congested turbinates.  No sinus tenderness.  Pharynx:  Normal Neck:  Supple.  No adenopathy.   Lungs:  Clear to auscultation.  Breath sounds are equal.  Moving air well. Heart:  Regular rate and rhythm without murmurs, rubs, or gallops.  Abdomen:  Nontender without masses or hepatosplenomegaly.  Bowel sounds are present.  No CVA or flank tenderness.  Extremities:  No edema.  Skin:  No rash present.   UC Treatments / Results  Labs (all labs ordered are listed, but only abnormal results are displayed) Labs Reviewed  SARS-COV-2 RNA,(COVID-19) QUALITATIVE NAAT  POC SARS CORONAVIRUS 2 AG -  ED negative    EKG   Radiology No results found.  Procedures Procedures (including critical care time)  Medications Ordered in UC Medications - No data to display  Initial Impression / Assessment and Plan / UC Course  I have reviewed the triage vital signs and the nursing notes.  Pertinent labs & imaging results that were available during my care of the patient were reviewed by me and considered in my medical decision making (see chart for details).    Benign exam.  There is no evidence of bacterial infection today.  Treat symptomatically for now. COVID19 PCR pending.   Final Clinical  Impressions(s) / UC Diagnoses   Final diagnoses:  At increased risk of exposure to COVID-19 virus     Discharge Instructions     If cold-like symptoms develop, try the following: Take plain guaifenesin (1200mg  extended release tabs such as Mucinex) twice daily, with plenty of water, for cough and congestion.  May add Pseudoephedrine (30mg , one or two every 4 to 6 hours) for sinus congestion.  Get adequate rest.   May use Afrin nasal spray (or generic oxymetazoline) each morning for about 5 days and then discontinue.  Also recommend using saline nasal spray several times daily and saline nasal irrigation (AYR is a common brand).  Use Flonase nasal spray each morning after using Afrin nasal spray and saline nasal irrigation. Try warm salt water gargles for sore throat.  Stop all antihistamines for now, and other non-prescription cough/cold preparations. May take Ibuprofen 200mg , 4 tabs every 8 hours with food for fever, body aches, etc. May take Delsym Cough Suppressant ("12 Hour Cough Relief") at bedtime for nighttime cough.   Isolate yourself until COVID-19 test result is available.   If your COVID19 test is positive, then you are infected with the novel coronavirus and could give the virus to others.  Please continue isolation at home for at least 10 days since the start of your symptoms.  Once you complete your 10 day quarantine, you may return to normal activities as long as you've not had a fever for over 24 hours (without taking fever reducing medicine) and your symptoms are improving. Please continue good preventive care measures, including:  frequent hand-washing, avoid touching your face, cover coughs/sneezes, stay out of crowds and keep a 6 foot distance from others.  Go to the nearest hospital emergency room if fever/cough/breathlessness are severe or illness seems like a threat to life.     ED Prescriptions    None        Kandra Nicolas, MD 09/14/19 2056

## 2019-09-14 LAB — SARS-COV-2 RNA,(COVID-19) QUALITATIVE NAAT: SARS CoV2 RNA: NOT DETECTED

## 2019-10-14 ENCOUNTER — Encounter: Payer: Self-pay | Admitting: Family Medicine

## 2019-10-14 ENCOUNTER — Ambulatory Visit (INDEPENDENT_AMBULATORY_CARE_PROVIDER_SITE_OTHER): Payer: Medicare Other | Admitting: Family Medicine

## 2019-10-14 VITALS — BP 114/58 | HR 59 | Temp 97.5°F | Wt 182.4 lb

## 2019-10-14 DIAGNOSIS — Z23 Encounter for immunization: Secondary | ICD-10-CM | POA: Diagnosis not present

## 2019-10-14 DIAGNOSIS — E782 Mixed hyperlipidemia: Secondary | ICD-10-CM

## 2019-10-14 DIAGNOSIS — G4730 Sleep apnea, unspecified: Secondary | ICD-10-CM | POA: Diagnosis not present

## 2019-10-14 DIAGNOSIS — M8949 Other hypertrophic osteoarthropathy, multiple sites: Secondary | ICD-10-CM

## 2019-10-14 DIAGNOSIS — G43009 Migraine without aura, not intractable, without status migrainosus: Secondary | ICD-10-CM

## 2019-10-14 DIAGNOSIS — Z20822 Contact with and (suspected) exposure to covid-19: Secondary | ICD-10-CM

## 2019-10-14 DIAGNOSIS — M159 Polyosteoarthritis, unspecified: Secondary | ICD-10-CM

## 2019-10-14 DIAGNOSIS — M150 Primary generalized (osteo)arthritis: Secondary | ICD-10-CM

## 2019-10-14 MED ORDER — MELOXICAM 7.5 MG PO TABS: 7.5000 mg | ORAL_TABLET | Freq: Every day | ORAL | 0 refills | Status: DC | PRN

## 2019-10-14 MED ORDER — NURTEC 75 MG PO TBDP: 75.0000 mg | ORAL_TABLET | Freq: Every day | ORAL | 3 refills | Status: DC | PRN

## 2019-10-14 NOTE — Progress Notes (Signed)
1. CPAP Rx for sleep apnea 2. Foot and hand pain medication other than Tylenol 3. Rt side throat tenderness with small lump 4. Headaches - stopped seeing Neurologist due to medication starting to have side effects. 5. Rt thigh has stinging pain in 1 location.

## 2019-10-14 NOTE — Patient Instructions (Signed)
Great to see you today! Have labs completed when fasting.  I'll work on getting CPAP supplies set up.  Lets see if we can get nurtec for you to try for migraines.  You may use meloxicam as needed for joint pain.   See me again in 6 months or sooner if needed.

## 2019-10-15 LAB — COMPLETE METABOLIC PANEL WITH GFR
AG Ratio: 1.5 (calc) (ref 1.0–2.5)
ALT: 20 U/L (ref 6–29)
AST: 27 U/L (ref 10–35)
Albumin: 4.4 g/dL (ref 3.6–5.1)
Alkaline phosphatase (APISO): 117 U/L (ref 37–153)
BUN: 10 mg/dL (ref 7–25)
CO2: 29 mmol/L (ref 20–32)
Calcium: 10.1 mg/dL (ref 8.6–10.4)
Chloride: 103 mmol/L (ref 98–110)
Creat: 0.82 mg/dL (ref 0.60–0.93)
GFR, Est African American: 84 mL/min/{1.73_m2} (ref >=60)
GFR, Est Non African American: 72 mL/min/{1.73_m2} (ref >=60)
Globulin: 2.9 g/dL (calc) (ref 1.9–3.7)
Glucose, Bld: 100 mg/dL — ABNORMAL HIGH (ref 65–99)
Potassium: 4.1 mmol/L (ref 3.5–5.3)
Sodium: 140 mmol/L (ref 135–146)
Total Bilirubin: 0.6 mg/dL (ref 0.2–1.2)
Total Protein: 7.3 g/dL (ref 6.1–8.1)

## 2019-10-15 LAB — LIPID PANEL
Cholesterol: 152 mg/dL (ref <200)
HDL: 68 mg/dL (ref >=50)
LDL Cholesterol (Calc): 64 mg/dL (calc)
Non-HDL Cholesterol (Calc): 84 mg/dL (calc) (ref <130)
Total CHOL/HDL Ratio: 2.2 (calc) (ref <5.0)
Triglycerides: 115 mg/dL (ref <150)

## 2019-10-16 ENCOUNTER — Encounter: Payer: Self-pay | Admitting: Family Medicine

## 2019-10-18 DIAGNOSIS — M1990 Unspecified osteoarthritis, unspecified site: Secondary | ICD-10-CM | POA: Insufficient documentation

## 2019-10-18 MED ORDER — AMBULATORY NON FORMULARY MEDICATION: 0 refills | Status: DC

## 2019-10-18 NOTE — Progress Notes (Signed)
Christina Hayes - 69 y.o. female MRN [id number]  Date of birth: 1950-02-10  Subjective Chief Complaint  Patient presents with  . Follow-up    HPI Christina Hayes is a 69 y.o. female here today for follow up visit.  She has history of asthma, OSA, HLD, Migraines, OA and carcinoid tumor.     She needs order for updated CPAP supplies including mask, tubing and filters.  Her machine still works well.   She continues to have migraines although not as often as she was several years ago.  She had side effects or been unbable to tolerate previous medications including topiramate, protriptyline and triptans.  She would like to try something to help with management of her migraines.    She has had some stiffness and joint pain in her feet and hands.  She has tried tylenol but has not have much improvement with this.  Denies joint swelling.   She is tolerating crestor well for management of cholesterol.  She denies myalgias.    ROS:  A comprehensive ROS was completed and negative except as noted per HPI     Allergies  Allergen Reactions  . Trazodone And Nefazodone Other (See Comments)    Nausea, fatique and sweating  . Codeine Itching    *Can take Hydrocodone*  . Protriptyline Palpitations  . Tramadol Other (See Comments)    headache  . Vivactil [Protriptyline Hcl] Palpitations    Past Medical History:  Diagnosis Date  . Acid reflux   . Allergic rhinitis, cause unspecified   . Allergy   . Anemia    years ago   . Cataract   . Complication of anesthesia SLOW TO WAKE  . Cough   . DJD (degenerative joint disease)    spine  . Frequency of urination   . H/O hiatal hernia    small  . Head cold   . Headache(784.0)    migraqine- uses essential oils  . HYPERLIPIDEMIA, MIXED 12/03/2006   PT STATES NOT TAKING MED AS PRESCRIBED  . MIGRAINES, HX OF 12/03/2006  . Neuromuscular disorder ([place])   . Nocturia   . OSA on CPAP   . Osteopenia   . PONV (postoperative nausea and vomiting)     in past  . Shortness of breath   . Sleep apnea    cpap  . SUI (stress urinary incontinence, female)   . Urgency of urination   . VERTIGO, HX OF 12/03/2006    Past Surgical History:  Procedure Laterality Date  . COLONOSCOPY    . PUBOVAGINAL SLING  04/30/2011   Procedure: Gaynelle Arabian;  Surgeon: Bernestine Amass, MD;  Location: [facility];  Service: Urology;  Laterality: N/A;  sub urethral sling    possible ower  . RIGID BRONCHOSCOPY N/A 04/23/2013   Procedure: LASER BRONCHOSCOPY;  Surgeon: Melrose Nakayama, MD;  Location: [place];  Service: Thoracic;  Laterality: N/A;  . [place]  . VIDEO BRONCHOSCOPY Bilateral 03/26/2013   Procedure: VIDEO BRONCHOSCOPY WITHOUT FLUORO;  Surgeon: Tanda Rockers, MD;  Location: [facility];  Service: Cardiopulmonary;  Laterality: Bilateral;  . VIDEO BRONCHOSCOPY N/A 04/23/2013   Procedure: VIDEO BRONCHOSCOPY;  Surgeon: Melrose Nakayama, MD;  Location: [place];  Service: Thoracic;  Laterality: N/A;  . VIDEO BRONCHOSCOPY N/A 05/28/2014   Procedure: VIDEO BRONCHOSCOPY;  Surgeon: Melrose Nakayama, MD;  Location: [place];  Service: Thoracic;  Laterality: N/A;  Social History   Socioeconomic History  . Marital status: Married    Spouse name: Alvester Chou  . Number of children: 1  . Years of education: 64  . Highest education level: 12th grade  Occupational History  . Occupation: Retired    Comment: Emergency planning/management officer  Tobacco Use  . Smoking status: Never Smoker  . Smokeless tobacco: Never Used  Vaping Use  . Vaping Use: Never used  Substance and Sexual Activity  . Alcohol use: No  . Drug use: No  . Sexual activity: Not Currently    Partners: Male    Birth control/protection: Surgical  Other Topics Concern  . 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 Gets on computer during the day. Checks emails. Plays games on computer, reads and shops online. Sees her daughter occasionally due to COVID   Social Determinants of Health   Financial Resource Strain:   . Difficulty of Paying Living Expenses: Not on file  Food Insecurity:   . Worried About Charity fundraiser in the Last Year: Not on file  . Ran Out of Food in the Last Year: Not on file  Transportation Needs:   . Lack of Transportation (Medical): Not on file  . Lack of Transportation (Non-Medical): Not on file  Physical Activity:   . Days of Exercise per Week: Not on file  . Minutes of Exercise per Session: Not on file  Stress: No Stress Concern Present  . Feeling of Stress : Not at all  Social Connections:   . Frequency of Communication with Friends and Family: Not on file  . Frequency of Social Gatherings with Friends and Family: Not on file  . Attends Religious Services: Not on file  . Active Member of Clubs or Organizations: Not on file  . Attends Archivist Meetings: Not on file  . Marital Status: Not on file    Family History  Problem Relation Age of Onset  . Cancer Mother        Colon Cancer survivor  . Diabetes Mother   . Colon cancer Mother   . Diabetes Father   . Hyperlipidemia Father   . Alzheimer's disease Father   . Cancer Maternal Grandmother        Breast Cancer  . Breast cancer Maternal Grandmother   . Cancer Daughter 84       stage III-B breast CA  . Breast cancer Daughter   . Colon polyps Brother   . Allergies Daughter   . Esophageal cancer Neg Hx   . Rectal cancer Neg Hx   . Stomach cancer Neg Hx     Health Maintenance  Topic Date Due  . MAMMOGRAM  04/16/2021  . COLONOSCOPY  03/06/2023  . TETANUS/TDAP  01/27/2024  . INFLUENZA VACCINE  Completed  . DEXA SCAN  Completed  . COVID-19 Vaccine  Completed  . Hepatitis C Screening  Completed  . PNA vac Low Risk  Adult  Completed     ----------------------------------------------------------------------------------------------------------------------------------------------------------------------------------------------------------------- Physical Exam BP (!) 114/58 (BP Location: Left Arm, Patient Position: Sitting, Cuff Size: Normal)   Pulse (!) 59   Temp (!) 97.5 F (36.4 C)   Wt 182 lb 6.4 oz (82.7 kg)   SpO2 99%   BMI 33.36 kg/m  Physical Exam Constitutional:      Appearance: Normal appearance.  HENT:     Head: Normocephalic and atraumatic.  Eyes:     General: No scleral icterus. Cardiovascular:     Rate and Rhythm: Normal rate and regular rhythm.  Pulmonary:     Effort: Pulmonary effort is normal.     Breath sounds: Normal breath sounds.  Musculoskeletal:     Cervical back: Neck supple.  Lymphadenopathy:     Cervical: No cervical adenopathy.  Neurological:     General: No focal deficit present.     Mental Status: She is alert.  Psychiatric:        Mood and Affect: Mood normal.        Behavior: Behavior normal.     ------------------------------------------------------------------------------------------------------------------------------------------------------------------------------------------------------------------- Assessment and Plan  Severe sleep apnea Due for updated supplies.  Rx ordered.   Migraine She has either had reactions of been uncusscesful with several medications including topiramate, tricyclincs and triptans.  Discussed trying nurtec as needed for migraines.    Osteoarthritis Start meloxicam daily as needed.   HLD (hyperlipidemia) Tolerating crestor well.  Update Lipid panel and CMP   Meds ordered this encounter  Medications  . Rimegepant Sulfate (NURTEC) 75 MG TBDP    Sig: Take 75 mg by mouth daily as needed.    Dispense:  10 tablet    Refill:  3  . meloxicam (MOBIC) 7.5 MG tablet    Sig: Take 1 tablet (7.5 mg total) by mouth daily  as needed for pain.    Dispense:  30 tablet    Refill:  0    Return in about 6 months (around 04/12/2020) for HLD/Headaches.    This visit occurred during the SARS-CoV-2 public health emergency.  Safety protocols were in place, including screening questions prior to the visit, additional usage of staff PPE, and extensive cleaning of exam room while observing appropriate contact time as indicated for disinfecting solutions.

## 2019-10-18 NOTE — Assessment & Plan Note (Signed)
Tolerating crestor well.  Update Lipid panel and CMP

## 2019-10-18 NOTE — Assessment & Plan Note (Signed)
Start meloxicam daily as needed.

## 2019-10-18 NOTE — Assessment & Plan Note (Signed)
Due for updated supplies.  Rx ordered.

## 2019-10-18 NOTE — Assessment & Plan Note (Signed)
She has either had reactions of been uncusscesful with several medications including topiramate, tricyclincs and triptans.  Discussed trying nurtec as needed for migraines.

## 2019-10-20 ENCOUNTER — Other Ambulatory Visit: Payer: Self-pay | Admitting: Family Medicine

## 2019-10-20 MED ORDER — AMBULATORY NON FORMULARY MEDICATION: 0 refills | Status: AC

## 2019-10-20 MED ORDER — MELOXICAM 7.5 MG PO TABS: 7.5000 mg | ORAL_TABLET | Freq: Every day | ORAL | 1 refills | Status: DC | PRN

## 2019-10-20 MED ORDER — NURTEC 75 MG PO TBDP
75.0000 mg | ORAL_TABLET | Freq: Every day | ORAL | 3 refills | Status: DC | PRN
Start: 2019-10-20 — End: 2019-10-28

## 2019-10-21 ENCOUNTER — Telehealth: Payer: Self-pay | Admitting: Family Medicine

## 2019-10-21 NOTE — Telephone Encounter (Signed)
Received fax for PA on Nurtec sent through cover my meds waiting on determination. - CF

## 2019-10-22 NOTE — Telephone Encounter (Signed)
Denial received. Placed in Dr. Zigmund Daniel box for further advise.

## 2019-10-26 ENCOUNTER — Other Ambulatory Visit: Payer: Self-pay | Admitting: Family Medicine

## 2019-10-26 DIAGNOSIS — Z20822 Contact with and (suspected) exposure to covid-19: Secondary | ICD-10-CM | POA: Diagnosis not present

## 2019-10-26 MED ORDER — TRIAMCINOLONE ACETONIDE 0.1 % EX CREA
1.0000 | TOPICAL_CREAM | Freq: Two times a day (BID) | CUTANEOUS | 3 refills | Status: DC
Start: 2019-10-26 — End: 2020-10-13

## 2019-10-27 LAB — SARS-COV-2 SEMI-QUANTITATIVE TOTAL ANTIBODY, SPIKE
SARS-CoV-2 Semi-Quant Total Ab: 432 U/mL (ref <0.8)
SARS-CoV-2 Spike Ab Interp: POSITIVE

## 2019-10-28 ENCOUNTER — Other Ambulatory Visit: Payer: Self-pay | Admitting: Family Medicine

## 2019-10-28 MED ORDER — RIZATRIPTAN BENZOATE 5 MG PO TABS: 5.0000 mg | ORAL_TABLET | ORAL | 3 refills | Status: DC | PRN

## 2019-10-29 ENCOUNTER — Other Ambulatory Visit: Payer: Self-pay | Admitting: Family Medicine

## 2019-10-29 MED ORDER — RIZATRIPTAN BENZOATE 5 MG PO TABS: 5.0000 mg | ORAL_TABLET | ORAL | 3 refills | Status: DC | PRN

## 2019-11-03 ENCOUNTER — Other Ambulatory Visit: Payer: Self-pay | Admitting: Family Medicine

## 2019-11-03 DIAGNOSIS — K219 Gastro-esophageal reflux disease without esophagitis: Secondary | ICD-10-CM

## 2019-11-22 ENCOUNTER — Encounter: Payer: Self-pay | Admitting: Family Medicine

## 2019-11-23 ENCOUNTER — Other Ambulatory Visit: Payer: Self-pay | Admitting: Family Medicine

## 2019-11-23 MED ORDER — TIZANIDINE HCL 4 MG PO TABS: 4.0000 mg | ORAL_TABLET | Freq: Three times a day (TID) | ORAL | 0 refills | Status: DC | PRN

## 2019-12-13 ENCOUNTER — Encounter: Payer: Self-pay | Admitting: Family Medicine

## 2019-12-14 ENCOUNTER — Encounter: Payer: Self-pay | Admitting: Nurse Practitioner

## 2019-12-14 ENCOUNTER — Telehealth (INDEPENDENT_AMBULATORY_CARE_PROVIDER_SITE_OTHER): Payer: Medicare Other | Admitting: Nurse Practitioner

## 2019-12-14 DIAGNOSIS — J309 Allergic rhinitis, unspecified: Secondary | ICD-10-CM

## 2019-12-14 MED ORDER — AZELASTINE HCL 0.1 % NA SOLN: 2.0000 | Freq: Two times a day (BID) | NASAL | 12 refills | Status: DC

## 2019-12-14 MED ORDER — FLUTICASONE PROPIONATE 50 MCG/ACT NA SUSP: 2.0000 | Freq: Every day | NASAL | 6 refills | Status: DC

## 2019-12-14 NOTE — Progress Notes (Addendum)
Virtual Video Visit via MyChart Note  I connected with  Christina Hayes on 12/14/19 at 11:20 AM EST by the video enabled telemedicine application for , MyChart, and verified that I am speaking with the correct person using two identifiers.   I introduced myself as a Designer, jewellery with the practice. We discussed the limitations of evaluation and management by telemedicine and the availability of in person appointments. The patient expressed understanding and agreed to proceed.  Participating parties in this visit include the patient and the nurse practitioner only.  Patient provided information to the nurse practitioner.  The patient is: at home I am: in the office  Subjective:    CC:  Chief Complaint  Patient presents with   Allergic Rhinitis     HPI: Christina Hayes is a 69 y.o. y/o female presenting via [place] today for allergic rhinitis. She reports that she has had an issue with allergic rhinitis for years. She takes zyrtec and fluticasone daily for her symptoms, but over the past week she reports her symptoms have exacerbated and her regular medications are not helping.   She endorses clear rhinorrhea.  She denies sore throat, sinus pain/pressure, headache, fevers, chills, fatigue, or cough.    Past medical history, Surgical history, Family history not pertinant except as noted below, Social history, Allergies, and medications have been entered into the medical record, reviewed, and corrections made.   Review of Systems:  See HPI for pertinent positive and negatives  Objective:    General: Speaking clearly in complete sentences without any shortness of breath.   Alert and oriented x3.   Normal judgment.  No Acute distress.   Impression and Recommendations:    1. Allergic rhinitis, unspecified seasonality, unspecified trigger Symptoms and presentation consistent with exacerbation of allergic rhinitis.  Feel this is most likely related to recent change in the weather.   Given no other associated symptoms highly improbable that there is a bacterial or viral component present.  Discussed with the patient the option of changing her daily Zyrtec to a another allergy medication such as Hayes or Claritin to see if this would be more helpful since she has been on this medication for quite some time. Recommend continuing daily allergy medication in addition to fluticasone nasal spray.  Discussed the option of adding azelastine nasal spray to her medication regimen to be used during periods of exacerbation. Patient to follow-up if symptoms worsen or fail to improve. If symptoms are not well controlled with addition of azelastine will consider intranasal ipratropium bromide and possible addition of montelukast. - azelastine (ASTELIN) 0.1 % nasal spray; Place 2 sprays into both nostrils 2 (two) times daily. Use in each nostril as directed  Dispense: 30 mL; Refill: 12 - fluticasone (FLONASE) 50 MCG/ACT nasal spray; Place 2 sprays into both nostrils daily.  Dispense: 16 g; Refill: 6    I discussed the assessment and treatment plan with the patient. The patient was provided an opportunity to ask questions and all were answered. The patient agreed with the plan and demonstrated an understanding of the instructions.   The patient was advised to call back or seek an in-person evaluation if the symptoms worsen or if the condition fails to improve as anticipated.  I provided 20 minutes of non-face-to-face interaction with this [place] visit including intake, same-day documentation, and chart review.   Orma Render, NP

## 2019-12-28 ENCOUNTER — Encounter: Payer: Self-pay | Admitting: Family Medicine

## 2019-12-28 ENCOUNTER — Ambulatory Visit (INDEPENDENT_AMBULATORY_CARE_PROVIDER_SITE_OTHER): Payer: Medicare Other | Admitting: Family Medicine

## 2019-12-28 DIAGNOSIS — J310 Chronic rhinitis: Secondary | ICD-10-CM | POA: Diagnosis not present

## 2019-12-28 MED ORDER — MONTELUKAST SODIUM 10 MG PO TABS: 10.0000 mg | ORAL_TABLET | Freq: Every day | ORAL | 3 refills | Status: DC

## 2019-12-28 MED ORDER — IPRATROPIUM BROMIDE 0.03 % NA SOLN: 2.0000 | Freq: Two times a day (BID) | NASAL | 12 refills | Status: DC | PRN

## 2019-12-28 NOTE — Assessment & Plan Note (Signed)
Will try addition of singulair and atrovent nasal spray.  Recommend humidifier in home to keep dust and allergens down and decrease airway irritation.  If not improving over the next couple of weeks we discussed referral to Allergist.

## 2019-12-28 NOTE — Progress Notes (Signed)
Christina Hayes - 69 y.o. female MRN [id number]  Date of birth: September 30, 1950  Subjective Chief Complaint  Patient presents with  . Nasal Congestion    HPI Christina Hayes is a 69 y.o. female here today for follow up of rhinitis.  She has had issues with rhinitis for several weeks.  She had tried cetirizine and flonase at first but these weren't working very well and she had virtual visit with Emeterio Reeve and astelin nasal spray was added.  She also switched her antihistamine to claritin.  She still hasn't had much improvement.  Nasal discharge remains clear.  She denies sinus pain, sore throat, fever, chills, headache.   ROS:  A comprehensive ROS was completed and negative except as noted per HPI  Allergies  Allergen Reactions  . Trazodone And Nefazodone Other (See Comments)    Nausea, fatique and sweating  . Codeine Itching    *Can take Hydrocodone*  . Protriptyline Palpitations  . Tramadol Other (See Comments)    headache  . Vivactil [Protriptyline Hcl] Palpitations    Past Medical History:  Diagnosis Date  . Acid reflux   . Allergic rhinitis, cause unspecified   . Allergy   . Anemia    years ago   . Cataract   . Complication of anesthesia SLOW TO WAKE  . Cough   . DJD (degenerative joint disease)    spine  . Frequency of urination   . H/O hiatal hernia    small  . Head cold   . Headache(784.0)    migraqine- uses essential oils  . HYPERLIPIDEMIA, MIXED 12/03/2006   PT STATES NOT TAKING MED AS PRESCRIBED  . MIGRAINES, HX OF 12/03/2006  . Neuromuscular disorder ([place])   . Nocturia   . OSA on CPAP   . Osteopenia   . PONV (postoperative nausea and vomiting)    in past  . Shortness of breath   . Sleep apnea    cpap  . SUI (stress urinary incontinence, female)   . Urgency of urination   . VERTIGO, HX OF 12/03/2006    Past Surgical History:  Procedure Laterality Date  . COLONOSCOPY    . PUBOVAGINAL SLING  04/30/2011   Procedure: Gaynelle Arabian;  Surgeon: Bernestine Amass, MD;  Location[facility];  Service: Urology;  Laterality: N/A;  sub urethral sling    possible ower  . RIGID BRONCHOSCOPY N/A 04/23/2013   Procedure: LASER BRONCHOSCOPY;  Surgeon: Melrose Nakayama, MD;  Location: [place];  Service: Thoracic;  Laterality: N/A;  . [place]  . VIDEO BRONCHOSCOPY Bilateral 03/26/2013   Procedure: VIDEO BRONCHOSCOPY WITHOUT FLUORO;  Surgeon: Tanda Rockers, MD;  Location: [facility];  Service: Cardiopulmonary;  Laterality: Bilateral;  . VIDEO BRONCHOSCOPY N/A 04/23/2013   Procedure: VIDEO BRONCHOSCOPY;  Surgeon: Melrose Nakayama, MD;  Location: [place];  Service: Thoracic;  Laterality: N/A;  . VIDEO BRONCHOSCOPY N/A 05/28/2014   Procedure: VIDEO BRONCHOSCOPY;  Surgeon: Melrose Nakayama, MD;  Location: [place];  Service: Thoracic;  Laterality: N/A;    Social History   Socioeconomic History  . Marital status: Married    Spouse name: Alvester Chou  . Number of children: 1  . Years of education: 30  . Highest education level: 12th grade  Occupational History  . Occupation: Retired    Comment: Emergency planning/management officer  Tobacco Use  . Smoking status: Never Smoker  . Smokeless tobacco: Never  Used  Vaping Use  . Vaping Use: Never used  Substance and Sexual Activity  . Alcohol use: No  . Drug use: No  . Sexual activity: Not Currently    Partners: Male    Birth control/protection: Surgical  Other Topics Concern  . 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 Gets on computer during the day. Checks emails. Plays games on computer, reads and shops online. Sees her daughter occasionally due to COVID   Social Determinants of Health   Financial Resource Strain:   . Difficulty of Paying Living  Expenses: Not on file  Food Insecurity:   . Worried About Charity fundraiser in the Last Year: Not on file  . Ran Out of Food in the Last Year: Not on file  Transportation Needs:   . Lack of Transportation (Medical): Not on file  . Lack of Transportation (Non-Medical): Not on file  Physical Activity:   . Days of Exercise per Week: Not on file  . Minutes of Exercise per Session: Not on file  Stress: No Stress Concern Present  . Feeling of Stress : Not at all  Social Connections:   . Frequency of Communication with Friends and Family: Not on file  . Frequency of Social Gatherings with Friends and Family: Not on file  . Attends Religious Services: Not on file  . Active Member of Clubs or Organizations: Not on file  . Attends Archivist Meetings: Not on file  . Marital Status: Not on file    Family History  Problem Relation Age of Onset  . Cancer Mother        Colon Cancer survivor  . Diabetes Mother   . Colon cancer Mother   . Diabetes Father   . Hyperlipidemia Father   . Alzheimer's disease Father   . Cancer Maternal Grandmother        Breast Cancer  . Breast cancer Maternal Grandmother   . Cancer Daughter 50       stage III-B breast CA  . Breast cancer Daughter   . Colon polyps Brother   . Allergies Daughter   . Esophageal cancer Neg Hx   . Rectal cancer Neg Hx   . Stomach cancer Neg Hx     Health Maintenance  Topic Date Due  . MAMMOGRAM  04/16/2021  . COLONOSCOPY  03/06/2023  . TETANUS/TDAP  01/27/2024  . INFLUENZA VACCINE  Completed  . DEXA SCAN  Completed  . COVID-19 Vaccine  Completed  . Hepatitis C Screening  Completed  . PNA vac Low Risk Adult  Completed     ----------------------------------------------------------------------------------------------------------------------------------------------------------------------------------------------------------------- Physical Exam BP 127/68 (BP Location: Left Arm, Patient Position: Sitting, Cuff  Size: Normal)   Pulse 67   Temp 97.7 F (36.5 C)   Wt 174 lb 11.2 oz (79.2 kg)   SpO2 98%   BMI 31.95 kg/m   Physical Exam Constitutional:      Appearance: Normal appearance.  HENT:     Head: Normocephalic and atraumatic.     Nose: Rhinorrhea (clear) present. No congestion.  Eyes:     General: No scleral icterus. Cardiovascular:     Rate and Rhythm: Normal rate and regular rhythm.  Pulmonary:     Effort: Pulmonary effort is normal.  Breath sounds: Normal breath sounds.  Musculoskeletal:     Cervical back: Neck supple.  Neurological:     General: No focal deficit present.     Mental Status: She is alert.  Psychiatric:        Mood and Affect: Mood normal.        Behavior: Behavior normal.     ------------------------------------------------------------------------------------------------------------------------------------------------------------------------------------------------------------------- Assessment and Plan  Chronic rhinitis Will try addition of singulair and atrovent nasal spray.  Recommend humidifier in home to keep dust and allergens down and decrease airway irritation.  If not improving over the next couple of weeks we discussed referral to [facility] ordered this encounter  Medications  . ipratropium (ATROVENT) 0.03 % nasal spray    Sig: Place 2 sprays into both nostrils 2 (two) times daily as needed for rhinitis.    Dispense:  30 mL    Refill:  12  . montelukast (SINGULAIR) 10 MG tablet    Sig: Take 1 tablet (10 mg total) by mouth at bedtime.    Dispense:  30 tablet    Refill:  3    No follow-ups on file.    This visit occurred during the SARS-CoV-2 public health emergency.  Safety protocols were in place, including screening questions prior to the visit, additional usage of staff PPE, and extensive cleaning of exam room while observing appropriate contact time as indicated for disinfecting solutions.

## 2019-12-28 NOTE — Patient Instructions (Signed)
Try ipratropium nasal spray every 12 hours as needed.  Start singulair daily.  Use cetirizine (zyrtec) or loratadine (claritin) daily.  Let me know if not improving over the next couple of weeks.

## 2020-01-12 ENCOUNTER — Encounter: Payer: Self-pay | Admitting: Family Medicine

## 2020-01-21 ENCOUNTER — Encounter: Payer: Self-pay | Admitting: Family Medicine

## 2020-02-02 ENCOUNTER — Other Ambulatory Visit: Payer: Self-pay | Admitting: Family Medicine

## 2020-02-02 MED ORDER — IPRATROPIUM BROMIDE 0.03 % NA SOLN: 2.0000 | Freq: Two times a day (BID) | NASAL | 3 refills | Status: DC | PRN

## 2020-02-02 MED ORDER — MONTELUKAST SODIUM 10 MG PO TABS
10.0000 mg | ORAL_TABLET | Freq: Every day | ORAL | 3 refills | Status: DC
Start: 2020-02-02 — End: 2020-09-22

## 2020-02-10 ENCOUNTER — Other Ambulatory Visit: Payer: Self-pay | Admitting: Family Medicine

## 2020-03-08 DIAGNOSIS — L819 Disorder of pigmentation, unspecified: Secondary | ICD-10-CM | POA: Diagnosis not present

## 2020-03-08 DIAGNOSIS — L821 Other seborrheic keratosis: Secondary | ICD-10-CM | POA: Diagnosis not present

## 2020-03-08 DIAGNOSIS — D1801 Hemangioma of skin and subcutaneous tissue: Secondary | ICD-10-CM | POA: Diagnosis not present

## 2020-03-08 DIAGNOSIS — Q825 Congenital non-neoplastic nevus: Secondary | ICD-10-CM | POA: Diagnosis not present

## 2020-03-08 DIAGNOSIS — R208 Other disturbances of skin sensation: Secondary | ICD-10-CM | POA: Diagnosis not present

## 2020-03-08 DIAGNOSIS — L720 Epidermal cyst: Secondary | ICD-10-CM | POA: Diagnosis not present

## 2020-03-21 ENCOUNTER — Ambulatory Visit (INDEPENDENT_AMBULATORY_CARE_PROVIDER_SITE_OTHER): Payer: Medicare Other | Admitting: Family Medicine

## 2020-03-21 DIAGNOSIS — Z0000 Encounter for general adult medical examination without abnormal findings: Secondary | ICD-10-CM

## 2020-03-21 DIAGNOSIS — M8580 Other specified disorders of bone density and structure, unspecified site: Secondary | ICD-10-CM | POA: Diagnosis not present

## 2020-03-21 NOTE — Patient Instructions (Addendum)
Health Maintenance, Female Adopting a healthy lifestyle and getting preventive care are important in promoting health and wellness. Ask your health care provider about:  The right schedule for you to have regular tests and exams.  Things you can do on your own to prevent diseases and keep yourself healthy. What should I know about diet, weight, and exercise? Eat a healthy diet  Eat a diet that includes plenty of vegetables, fruits, low-fat dairy products, and lean protein.  Do not eat a lot of foods that are high in solid fats, added sugars, or sodium.   Maintain a healthy weight Body mass index (BMI) is used to identify weight problems. It estimates body fat based on height and weight. Your health care provider can help determine your BMI and help you achieve or maintain a healthy weight. Get regular exercise Get regular exercise. This is one of the most important things you can do for your health. Most adults should:  Exercise for at least 150 minutes each week. The exercise should increase your heart rate and make you sweat (moderate-intensity exercise).  Do strengthening exercises at least twice a week. This is in addition to the moderate-intensity exercise.  Spend less time sitting. Even light physical activity can be beneficial. Watch cholesterol and blood lipids Have your blood tested for lipids and cholesterol at 71 years of age, then have this test every 5 years. Have your cholesterol levels checked more often if:  Your lipid or cholesterol levels are high.  You are older than 71 years of age.  You are at high risk for heart disease. What should I know about cancer screening? Depending on your health history and family history, you may need to have cancer screening at various ages. This may include screening for:  Breast cancer.  Cervical cancer.  Colorectal cancer.  Skin cancer.  Lung cancer. What should I know about heart disease, diabetes, and high blood  pressure? Blood pressure and heart disease  High blood pressure causes heart disease and increases the risk of stroke. This is more likely to develop in people who have high blood pressure readings, are of African descent, or are overweight.  Have your blood pressure checked: ? Every 3-5 years if you are 38-35 years of age. ? Every year if you are 19 years old or older. Diabetes Have regular diabetes screenings. This checks your fasting blood sugar level. Have the screening done:  Once every three years after age 64 if you are at a normal weight and have a low risk for diabetes.  More often and at a younger age if you are overweight or have a high risk for diabetes. What should I know about preventing infection? Hepatitis B If you have a higher risk for hepatitis B, you should be screened for this virus. Talk with your health care provider to find out if you are at risk for hepatitis B infection. Hepatitis C Testing is recommended for:  Everyone born from [birth year] through 1966.  Anyone with known risk factors for hepatitis C. Sexually transmitted infections (STIs)  Get screened for STIs, including gonorrhea and chlamydia, if: ? You are sexually active and are younger than 71 years of age. ? You are older than 71 years of age and your health care provider tells you that you are at risk for this type of infection. ? Your sexual activity has changed since you were last screened, and you are at increased risk for chlamydia or gonorrhea. Ask your health care  provider if you are at risk.  Ask your health care provider about whether you are at high risk for HIV. Your health care provider may recommend a prescription medicine to help prevent HIV infection. If you choose to take medicine to prevent HIV, you should first get tested for HIV. You should then be tested every 3 months for as long as you are taking the medicine. Pregnancy  If you are about to stop having your period (premenopausal) and  you may become pregnant, seek counseling before you get pregnant.  Take 400 to 800 micrograms (mcg) of folic acid every day if you become pregnant.  Ask for birth control (contraception) if you want to prevent pregnancy. Osteoporosis and menopause Osteoporosis is a disease in which the bones lose minerals and strength with aging. This can result in bone fractures. If you are 29 years old or older, or if you are at risk for osteoporosis and fractures, ask your health care provider if you should:  Be screened for bone loss.  Take a calcium or vitamin D supplement to lower your risk of fractures.  Be given hormone replacement therapy (HRT) to treat symptoms of menopause. Follow these instructions at home: Lifestyle  Do not use any products that contain nicotine or tobacco, such as cigarettes, e-cigarettes, and chewing tobacco. If you need help quitting, ask your health care provider.  Do not use street drugs.  Do not share needles.  Ask your health care provider for help if you need support or information about quitting drugs. Alcohol use  Do not drink alcohol if: ? Your health care provider tells you not to drink. ? You are pregnant, may be pregnant, or are planning to become pregnant.  If you drink alcohol: ? Limit how much you use to 0-1 drink a day. ? Limit intake if you are breastfeeding.  Be aware of how much alcohol is in your drink. In the [place] one drink equals one 12 oz bottle of beer (355 mL), one 5 oz glass of wine (148 mL), or one 1 oz glass of hard liquor (44 mL). General instructions  Schedule regular health, dental, and eye exams.  Stay current with your vaccines.  Tell your health care provider if: ? You often feel depressed. ? You have ever been abused or do not feel safe at home. Summary  Adopting a healthy lifestyle and getting preventive care are important in promoting health and wellness.  Follow your health care provider's instructions about healthy  diet, exercising, and getting tested or screened for diseases.  Follow your health care provider's instructions on monitoring your cholesterol and blood pressure. This information is not intended to replace advice given to you by your health care provider. Make sure you discuss any questions you have with your health care provider. Document Revised: 01/01/2018 Document Reviewed: 01/01/2018 Elsevier Patient Education  2021 [place] Maintenance Summary and Written Plan of Care  [name] ,  Thank you for allowing me to perform your Medicare Annual Wellness Visit and for your ongoing commitment to your health.   Health Maintenance & Immunization History Health Maintenance  Topic Date Due  . COVID-19 Vaccine (4 - Booster for Pfizer series) 06/02/2020  . MAMMOGRAM  04/16/2021  . COLONOSCOPY (Pts 45-60yrs Insurance coverage will need to be confirmed)  03/06/2023  . TETANUS/TDAP  01/27/2024  . INFLUENZA VACCINE  Completed  . DEXA SCAN  Completed  . Hepatitis C Screening  Completed  .  PNA vac Low Risk Adult  Completed   Immunization History  Administered Date(s) Administered  . Fluad Quad(high Dose 65+) 10/14/2019  . Influenza, High Dose Seasonal PF 10/23/2016, 09/30/2018  . Influenza,inj,Quad PF,6+ Mos 11/10/2015, 10/14/2017  . Influenza-Unspecified 10/23/2016  . PFIZER(Purple Top)SARS-COV-2 Vaccination 04/01/2019, 04/22/2019, 12/04/2019  . Pneumococcal Conjugate-13 07/13/2015  . Pneumococcal Polysaccharide-23 03/25/2012, 05/16/2017  . Td 05/20/2003  . Tdap 01/26/2014  . Zoster 04/25/2010    These are the patient goals that we discussed: Goals Addressed              This Visit's Progress   .  Patient Stated (pt-stated)        03/21/2020 AWV Goal: Exercise for General Health   Patient will verbalize understanding of the benefits of increased physical activity:  Exercising regularly is important. It will improve your  overall fitness, flexibility, and endurance.  Regular exercise also will improve your overall health. It can help you control your weight, reduce stress, and improve your bone density.  Over the next year, patient will increase physical activity as tolerated with a goal of at least 150 minutes of moderate physical activity per week.   You can tell that you are exercising at a moderate intensity if your heart starts beating faster and you start breathing faster but can still hold a conversation.  Moderate-intensity exercise ideas include:  Walking 1 mile (1.6 km) in about 15 minutes  Biking  Hiking  Golfing  Dancing  Water aerobics  Patient will verbalize understanding of everyday activities that increase physical activity by providing examples like the following: ? Yard work, such as: ? Pushing a Conservation officer, nature ? Raking and bagging leaves ? Washing your car ? Pushing a stroller ? Shoveling snow ? Gardening ? Washing windows or floors  Patient will be able to explain general safety guidelines for exercising:   Before you start a new exercise program, talk with your health care provider.  Do not exercise so much that you hurt yourself, feel dizzy, or get very short of breath.  Wear comfortable clothes and wear shoes with good support.  Drink plenty of water while you exercise to prevent dehydration or heat stroke.  Work out until your breathing and your heartbeat get faster.         This is a list of Health Maintenance Items that are overdue or due now: Shingrix  Orders/Referrals Placed Today: Orders Placed This Encounter  Procedures  . DEXAScan    Standing Status:   Future    Standing Expiration Date:   03/21/2021    Scheduling Instructions:     Please call patient to schedule. Needs appointment in [place]. Would like an afternoon appointment.    Order Specific Question:   Reason for exam:    Answer:   screening for osteoporosis    Order Specific Question:    Preferred imaging location?    Answer:   [facility]   (Contact our referral department at [phone] if you have not spoken with someone about your referral appointment within the next 5 days)    Follow-up Plan . Follow-up with [name], DO as planned . Schedule your appointment at your pharmacy for your shingrix vaccine.  . Dexa scan referral has been sent and they will give you a call to schedule.  . Medicare wellness in one year.

## 2020-03-21 NOTE — Progress Notes (Signed)
MEDICARE ANNUAL WELLNESS VISIT  03/21/2020  Telephone Visit Disclaimer This Medicare AWV was conducted by telephone due to national recommendations for restrictions regarding the COVID-19 Pandemic (e.g. social distancing).  I verified, using two identifiers, that I am speaking with Christina Hayes or their authorized healthcare agent. I discussed the limitations, risks, security, and privacy concerns of performing an evaluation and management service by telephone and the potential availability of an in-person appointment in the future. The patient expressed understanding and agreed to proceed.  Location of Patient: Home Location of Provider (nurse):  In the office.  Subjective:    Christina Hayes is a 70 y.o. female patient of [name], DO who had a Medicare Annual Wellness Visit today via telephone. Christina Hayes is Retired and lives with their spouse. she has 1 child. she reports that she is socially active and does interact with friends/family regularly. she is minimally physically active and enjoys sewing.  Patient Care Team: [name], DO as PCP - General (Family Medicine)  Advanced Directives 03/21/2020 12/30/2018 12/24/2017 12/28/2015 07/13/2015 04/17/2015 05/25/2014  Does Patient Have a Medical Advance Directive? Yes Yes No Yes Yes No No  Type of Paramedic of [place];Living will [place];Living will - [place];Living will Living will;Mental Health Advance Directive - -  Does patient want to make changes to medical advance directive? No - Patient declined No - Patient declined - - No - Patient declined - -  Copy of [place] in Chart? No - copy requested No - copy requested - - No - copy requested - -  Would patient like information on creating a medical advance directive? - - No - Patient declined - - No - patient declined information No - patient declined information    Hospital Utilization Over the  Past 12 Months: # of hospitalizations or ER visits: 0 # of surgeries: 0  Review of Systems    Patient reports that her overall health is better compared to last year.  History obtained from chart review and the patient  Patient Reported Readings (BP, Pulse, CBG, Weight, etc) none  Pain Assessment Pain : 0-10 Pain Score: 4  Pain Type: Chronic pain Pain Location: Back Pain Orientation: Lower Pain Radiating Towards: hips (sometimes) Pain Descriptors / Indicators: Aching Pain Onset: More than a month ago Pain Frequency: Intermittent Pain Relieving Factors: temporary relief with icy hot and heat pad; hot shower; tylenol Effect of Pain on Daily Activities: somewhat  Pain Relieving Factors: temporary relief with icy hot and heat pad; hot shower; tylenol  Current Medications & Allergies (verified) Allergies as of 03/21/2020      Reactions   Trazodone And Nefazodone Other (See Comments)   Nausea, fatique and sweating   Codeine Itching   *Can take Hydrocodone*   Protriptyline Palpitations   Tramadol Other (See Comments)   headache   Vivactil [protriptyline Hcl] Palpitations      Medication List       Accurate as of March 21, 2020  2:48 PM. If you have any questions, ask your nurse or doctor.        AMBULATORY NON FORMULARY MEDICATION Please provide cpap supplies: heated tubing, humidifier chamber, disposable filters, Mask/Mask frame/Head gear per patient preference   furosemide 20 MG tablet Commonly known as: LASIX TAKE 1 TABLET BY MOUTH  DAILY What changed:   how much to take  how to take this  when to take this  additional instructions  gabapentin 300 MG capsule Commonly known as: NEURONTIN TAKE 1 CAPSULE BY MOUTH  DURING THE DAY AND 2  CAPSULES BY MOUTH AT  BEDTIME   ipratropium 0.03 % nasal spray Commonly known as: ATROVENT Place 2 sprays into both nostrils 2 (two) times daily as needed for rhinitis. What changed: when to take this   meloxicam 7.5  MG tablet Commonly known as: MOBIC Take 1 tablet (7.5 mg total) by mouth daily as needed for pain.   montelukast 10 MG tablet Commonly known as: SINGULAIR Take 1 tablet (10 mg total) by mouth at bedtime.   pantoprazole 40 MG tablet Commonly known as: PROTONIX TAKE 1 TABLET BY MOUTH  DAILY   rizatriptan 5 MG tablet Commonly known as: Maxalt Take 1 tablet (5 mg total) by mouth as needed for migraine. May repeat in 2 hours if needed   rosuvastatin 20 MG tablet Commonly known as: Crestor Take 1 tablet (20 mg total) by mouth daily.   tiZANidine 4 MG tablet Commonly known as: Zanaflex Take 1 tablet (4 mg total) by mouth every 8 (eight) hours as needed for muscle spasms.   triamcinolone 0.1 % Commonly known as: KENALOG Apply 1 application topically 2 (two) times daily.       History (reviewed): Past Medical History:  Diagnosis Date  . Acid reflux   . Allergic rhinitis, cause unspecified   . Allergy   . Anemia    years ago   . Cataract   . Complication of anesthesia SLOW TO WAKE  . Cough   . DJD (degenerative joint disease)    spine  . Frequency of urination   . H/O hiatal hernia    small  . Head cold   . Headache(784.0)    migraqine- uses essential oils  . HYPERLIPIDEMIA, MIXED 12/03/2006   PT STATES NOT TAKING MED AS PRESCRIBED  . MIGRAINES, HX OF 12/03/2006  . Neuromuscular disorder ([place])   . Nocturia   . OSA on CPAP   . Osteopenia   . PONV (postoperative nausea and vomiting)    in past  . Shortness of breath   . Sleep apnea    cpap  . SUI (stress urinary incontinence, female)   . Urgency of urination   . VERTIGO, HX OF 12/03/2006   Past Surgical History:  Procedure Laterality Date  . COLONOSCOPY    . PUBOVAGINAL SLING  04/30/2011   Procedure: Gaynelle Arabian;  Surgeon: Bernestine Amass, MD;  [facility];  Service: Urology;  Laterality: N/A;  sub urethral sling    possible ower  . RIGID BRONCHOSCOPY N/A 04/23/2013    Procedure: LASER BRONCHOSCOPY;  Surgeon: Melrose Nakayama, MD;  Location: [facility];  Service: Thoracic;  Laterality: N/A;  . [place]  . VIDEO BRONCHOSCOPY Bilateral 03/26/2013   Procedure: VIDEO BRONCHOSCOPY WITHOUT FLUORO;  Surgeon: Tanda Rockers, MD;  Location: [facility];  Service: Cardiopulmonary;  Laterality: Bilateral;  . VIDEO BRONCHOSCOPY N/A 04/23/2013   Procedure: VIDEO BRONCHOSCOPY;  Surgeon: Melrose Nakayama, MD;  Location: [facility];  Service: Thoracic;  Laterality: N/A;  . VIDEO BRONCHOSCOPY N/A 05/28/2014   Procedure: VIDEO BRONCHOSCOPY;  Surgeon: Melrose Nakayama, MD;  Location: [facility] OR;  Service: Thoracic;  Laterality: N/A;   Family History  Problem Relation Age of Onset  . Cancer Mother        Colon Cancer survivor  . Diabetes Mother   . Colon  cancer Mother   . Diabetes Father   . Hyperlipidemia Father   . Alzheimer's disease Father   . Cancer Maternal Grandmother        Breast Cancer  . Breast cancer Maternal Grandmother   . Cancer Daughter 11       stage III-B breast CA  . Breast cancer Daughter   . Colon polyps Brother   . Allergies Daughter   . Esophageal cancer Neg Hx   . Rectal cancer Neg Hx   . Stomach cancer Neg Hx    Social History   Socioeconomic History  . Marital status: Married    Spouse name: Alvester Chou  . Number of children: 1  . Years of education: 84  . Highest education level: 12th grade  Occupational History  . Occupation: Retired    Comment: Emergency planning/management officer  Tobacco Use  . Smoking status: Never Smoker  . Smokeless tobacco: Never Used  Vaping Use  . Vaping Use: Never used  Substance and Sexual Activity  . Alcohol use: No  . Drug use: No  . Sexual activity: Not Currently    Partners: Male    Birth control/protection: Surgical  Other Topics Concern  . 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 Checks emails. Plays games on computer, reads and shops online. Her hobbies include reading and art.   Social Determinants of Health   Financial Resource Strain: Low Risk   . Difficulty of Paying Living Expenses: Not hard at all  Food Insecurity: No Food Insecurity  . Worried About Charity fundraiser in the Last Year: Never true  . Ran Out of Food in the Last Year: Never true  Transportation Needs: No Transportation Needs  . Lack of Transportation (Medical): No  . Lack of Transportation (Non-Medical): No  Physical Activity: Inactive  . Days of Exercise per Week: 0 days  . Minutes of Exercise per Session: 0 min  Stress: No Stress Concern Present  . Feeling of Stress : Not at all  Social Connections: Socially Isolated  . Frequency of Communication with Friends and Family: Twice a week  . Frequency of Social Gatherings with Friends and Family: Never  . Attends Religious Services: Never  . Active Member of Clubs or Organizations: No  . Attends Archivist Meetings: Never  . Marital Status: Married    Activities of Daily Living In your present state of health, do you have any difficulty performing the following activities: 03/21/2020  Hearing? N  Vision? N  Difficulty concentrating or making decisions? N  Walking or climbing stairs? N  Dressing or bathing? N  Doing errands, shopping? N  Preparing Food and eating ? N  Using the Toilet? N  In the past six months, have you accidently leaked urine? Y  Comment few times  Do you have problems with loss of bowel control? N  Managing your Medications? N  Managing your Finances? N  Housekeeping or managing your Housekeeping? N  Some recent data might be hidden    Patient Education/ Literacy How often do you need to have someone help you when you read instructions, pamphlets, or other written materials from your doctor or  pharmacy?: 1 - Never What is the last grade level you completed in school?: 12th  grade  Exercise Current Exercise Habits: The patient does not participate in regular exercise at present, Exercise limited by: orthopedic condition(s) (back problems)  Diet Patient reports consuming 2 meals a day and 3 snack(s) a day Patient reports that her primary diet is: Regular Patient reports that she does have regular access to food.   Depression Screen PHQ 2/9 Scores 03/21/2020 12/30/2018 12/24/2017 11/26/2017 10/14/2017 10/14/2017 07/13/2015  PHQ - 2 Score 0 0 1 1 0 0 0  PHQ- 9 Score - - 6 6 5  - -     Fall Risk Fall Risk  03/21/2020 12/30/2018 12/24/2017 10/14/2017 10/14/2017  Falls in the past year? 0 0 0 No No  Number falls in past yr: 0 0 - - -  Injury with Fall? 0 0 - - -  Risk for fall due to : No Fall Risks - Impaired balance/gait - -  Follow up Falls evaluation completed Falls prevention discussed Falls prevention discussed - -     Objective:  Christina Hayes seemed alert and oriented and she participated appropriately during our telephone visit.  Blood Pressure Weight BMI  BP Readings from Last 3 Encounters:  12/28/19 127/68  10/14/19 (!) 114/58  09/11/19 (!) 149/77   Wt Readings from Last 3 Encounters:  12/28/19 174 lb 11.2 oz (79.2 kg)  10/14/19 182 lb 6.4 oz (82.7 kg)  07/14/19 200 lb (90.7 kg)   BMI Readings from Last 1 Encounters:  12/28/19 31.95 kg/m    *Unable to obtain current vital signs, weight, and BMI due to telephone visit type  Hearing/Vision  . Ritta did not seem to have difficulty with hearing/understanding during the telephone conversation . Reports that she has had a formal eye exam by an eye care professional within the past year . Reports that she has not had a formal hearing evaluation within the past year *Unable to fully assess hearing and vision during telephone visit type  Cognitive Function: 6CIT Screen 03/21/2020 12/30/2018 12/24/2017  What Year? 0 points 0  points 0 points  What month? 0 points 0 points 0 points  What time? 0 points 0 points 0 points  Count back from 20 0 points 0 points 0 points  Months in reverse 0 points 0 points 0 points  Repeat phrase 0 points 0 points 0 points  Total Score 0 0 0   (Normal:0-7, Significant for Dysfunction: >8)  Normal Cognitive Function Screening: Yes   Immunization & Health Maintenance Record Immunization History  Administered Date(s) Administered  . Fluad Quad(high Dose 65+) 10/14/2019  . Influenza, High Dose Seasonal PF 10/23/2016, 09/30/2018  . Influenza,inj,Quad PF,6+ Mos 11/10/2015, 10/14/2017  . Influenza-Unspecified 10/23/2016  . PFIZER(Purple Top)SARS-COV-2 Vaccination 04/01/2019, 04/22/2019, 12/04/2019  . Pneumococcal Conjugate-13 07/13/2015  . Pneumococcal Polysaccharide-23 03/25/2012, 05/16/2017  . Td 05/20/2003  . Tdap 01/26/2014  . Zoster 04/25/2010    Health Maintenance  Topic Date Due  . COVID-19 Vaccine (4 - Booster for Pfizer series) 06/02/2020  . MAMMOGRAM  04/16/2021  . COLONOSCOPY (Pts 45-34yrs Insurance coverage will need to be confirmed)  03/06/2023  . TETANUS/TDAP  01/27/2024  . INFLUENZA VACCINE  Completed  . DEXA SCAN  Completed  . Hepatitis C Screening  Completed  . PNA vac Low Risk Adult  Completed       Assessment  This is a routine wellness examination for PAYSEN GOZA.  Health Maintenance: Due or Overdue There are no preventive care reminders to display for this patient.  Christina Hayes does not  need a referral for Community Assistance: Care Management:   no Social Work:    no Prescription Assistance:  no Nutrition/Diabetes Education:  no   Plan:  Personalized Goals Goals Addressed              This Visit's Progress   .  Patient Stated (pt-stated)        03/21/2020 AWV Goal: Exercise for General Health   Patient will verbalize understanding of the benefits of increased physical activity:  Exercising regularly is important. It will  improve your overall fitness, flexibility, and endurance.  Regular exercise also will improve your overall health. It can help you control your weight, reduce stress, and improve your bone density.  Over the next year, patient will increase physical activity as tolerated with a goal of at least 150 minutes of moderate physical activity per week.   You can tell that you are exercising at a moderate intensity if your heart starts beating faster and you start breathing faster but can still hold a conversation.  Moderate-intensity exercise ideas include:  Walking 1 mile (1.6 km) in about 15 minutes  Biking  Hiking  Golfing  Dancing  Water aerobics  Patient will verbalize understanding of everyday activities that increase physical activity by providing examples like the following: ? Yard work, such as: ? Pushing a Conservation officer, nature ? Raking and bagging leaves ? Washing your car ? Pushing a stroller ? Shoveling snow ? Gardening ? Washing windows or floors  Patient will be able to explain general safety guidelines for exercising:   Before you start a new exercise program, talk with your health care provider.  Do not exercise so much that you hurt yourself, feel dizzy, or get very short of breath.  Wear comfortable clothes and wear shoes with good support.  Drink plenty of water while you exercise to prevent dehydration or heat stroke.  Work out until your breathing and your heartbeat get faster.       Personalized Health Maintenance & Screening Recommendations  Bone densitometry screening Shingrix  Lung Cancer Screening Recommended: no (Low Dose CT Chest recommended if Age 18-80 years, 30 pack-year currently smoking OR have quit w/in past 15 years) Hepatitis C Screening recommended: no HIV Screening recommended: no  Advanced Directives: Written information was not prepared per patient's request.  Referrals & Orders Orders Placed This Encounter  Procedures  . [place]     Follow-up Plan . Follow-up with [name], DO as planned . Schedule your appointment at your pharmacy for your shingrix vaccine.  . Dexa scan referral has been sent and they will give you a call to schedule.  . Medicare wellness in one year.   I have personally reviewed and noted the following in the patient's chart:   . Medical and social history . Use of alcohol, tobacco or illicit drugs  . Current medications and supplements . Functional ability and status . Nutritional status . Physical activity . Advanced directives . List of other physicians . Hospitalizations, surgeries, and ER visits in previous 12 months . Vitals . Screenings to include cognitive, depression, and falls . Referrals and appointments  In addition, I have reviewed and discussed with Christina Hayes certain preventive protocols, quality metrics, and best practice recommendations. A written personalized care plan for preventive services as well as general preventive health recommendations is available and can be mailed to the patient at her request.      Tinnie Gens, RN  03/21/2020

## 2020-03-23 ENCOUNTER — Encounter: Payer: Self-pay | Admitting: Family Medicine

## 2020-03-23 ENCOUNTER — Telehealth (INDEPENDENT_AMBULATORY_CARE_PROVIDER_SITE_OTHER): Payer: Medicare Other | Admitting: Family Medicine

## 2020-03-23 VITALS — BP 114/63 | HR 51 | Wt 175.0 lb

## 2020-03-23 DIAGNOSIS — M545 Low back pain, unspecified: Secondary | ICD-10-CM

## 2020-03-23 DIAGNOSIS — J4530 Mild persistent asthma, uncomplicated: Secondary | ICD-10-CM

## 2020-03-23 DIAGNOSIS — J310 Chronic rhinitis: Secondary | ICD-10-CM

## 2020-03-23 MED ORDER — CARBINOXAMINE MALEATE 6 MG PO TABS: 1.0000 | ORAL_TABLET | Freq: Two times a day (BID) | ORAL | 0 refills | Status: DC | PRN

## 2020-03-23 MED ORDER — TIZANIDINE HCL 4 MG PO TABS: 4.0000 mg | ORAL_TABLET | Freq: Three times a day (TID) | ORAL | 0 refills | Status: DC | PRN

## 2020-03-23 NOTE — Assessment & Plan Note (Signed)
Tizanidine renewed.  Back rehab exercises mailed.  Discussed if worsening or becoming more frequent we would need to get updated imaging.

## 2020-03-23 NOTE — Assessment & Plan Note (Signed)
Will give trial of carbinoxamine since this is more drying.  Discussed potential side effects.   Referral placed to allergist as well.

## 2020-03-23 NOTE — Progress Notes (Signed)
Christina Hayes - 70 y.o. female MRN [id number]  Date of birth: 03/22/1950   This visit type was conducted due to national recommendations for restrictions regarding the COVID-19 Pandemic (e.g. social distancing).  This format is felt to be most appropriate for this patient at this time.  All issues noted in this document were discussed and addressed.  No physical exam was performed (except for noted visual exam findings with Video Visits).  I discussed the limitations of evaluation and management by telemedicine and the availability of in person appointments. The patient expressed understanding and agreed to proceed.  I connected with@ on 03/23/20 at  2:00 PM EST by a video enabled telemedicine application and verified that I am speaking with the correct person using two identifiers.  Present at visit: [name], DO [name]   Patient Location: Home [place] [address]   Provider location:   [facility]  Chief Complaint  Patient presents with  . Nasal Congestion    HPI  Christina Hayes is a 70 y.o. female who presents via audio/video conferencing for a telehealth visit today.  She has continued problems with rhinitis.  She has tried flonase, singulair and OTC antihistamines without much relief.  Recently tried ipratropium nasal spray which helps but is needing every day.  She denies sinus pain, shortness of breath or wheezing.   She is also having intermittent low back pain.  Pain is not radicular.  She get relief with tizanidine.  Requesting refill of this.    ROS:  A comprehensive ROS was completed and negative except as noted per HPI  Past Medical History:  Diagnosis Date  . Acid reflux   . Allergic rhinitis, cause unspecified   . Allergy   . Anemia    years ago   . Cataract   . Complication of anesthesia SLOW TO WAKE  . Cough   . DJD (degenerative joint disease)    spine  . Frequency of urination   . H/O hiatal hernia    small  . Head cold    . Headache(784.0)    migraqine- uses essential oils  . HYPERLIPIDEMIA, MIXED 12/03/2006   PT STATES NOT TAKING MED AS PRESCRIBED  . MIGRAINES, HX OF 12/03/2006  . Neuromuscular disorder ([place])   . Nocturia   . OSA on CPAP   . Osteopenia   . PONV (postoperative nausea and vomiting)    in past  . Shortness of breath   . Sleep apnea    cpap  . SUI (stress urinary incontinence, female)   . Urgency of urination   . VERTIGO, HX OF 12/03/2006    Past Surgical History:  Procedure Laterality Date  . COLONOSCOPY    . PUBOVAGINAL SLING  04/30/2011   Procedure: Gaynelle Arabian;  Surgeon: Bernestine Amass, MD;  Location: [facility];  Service: Urology;  Laterality: N/A;  sub urethral sling    possible ower  . RIGID BRONCHOSCOPY N/A 04/23/2013   Procedure: LASER BRONCHOSCOPY;  Surgeon: Melrose Nakayama, MD;  Location: [place];  Service: Thoracic;  Laterality: N/A;  . [place]  . VIDEO BRONCHOSCOPY Bilateral 03/26/2013   Procedure: VIDEO BRONCHOSCOPY WITHOUT FLUORO;  Surgeon: Tanda Rockers, MD;  Location: [facility];  Service: Cardiopulmonary;  Laterality: Bilateral;  . VIDEO BRONCHOSCOPY N/A 04/23/2013   Procedure: VIDEO BRONCHOSCOPY;  Surgeon: Melrose Nakayama, MD;  Location: [place];  Service:  Thoracic;  Laterality: N/A;  . VIDEO BRONCHOSCOPY N/A 05/28/2014   Procedure: VIDEO BRONCHOSCOPY;  Surgeon: Melrose Nakayama, MD;  Location: [facility];  Service: Thoracic;  Laterality: N/A;    Family History  Problem Relation Age of Onset  . Cancer Mother        Colon Cancer survivor  . Diabetes Mother   . Colon cancer Mother   . Diabetes Father   . Hyperlipidemia Father   . Alzheimer's disease Father   . Cancer Maternal Grandmother        Breast Cancer  . Breast cancer Maternal Grandmother   . Cancer Daughter 46       stage III-B breast CA  . Breast cancer Daughter   . Colon polyps Brother   . Allergies  Daughter   . Esophageal cancer Neg Hx   . Rectal cancer Neg Hx   . Stomach cancer Neg Hx     Social History   Socioeconomic History  . Marital status: Married    Spouse name: Alvester Chou  . Number of children: 1  . Years of education: 45  . Highest education level: 12th grade  Occupational History  . Occupation: Retired    Comment: Emergency planning/management officer  Tobacco Use  . Smoking status: Never Smoker  . Smokeless tobacco: Never Used  Vaping Use  . Vaping Use: Never used  Substance and Sexual Activity  . Alcohol use: No  . Drug use: No  . Sexual activity: Not Currently    Partners: Male    Birth control/protection: Surgical  Other Topics Concern  . 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 Checks emails. Plays games on computer, reads and shops online. Her hobbies include reading and art.   Social Determinants of Health   Financial Resource Strain: Low Risk   . Difficulty of Paying Living Expenses: Not hard at all  Food Insecurity: No Food Insecurity  . Worried About Charity fundraiser in the Last Year: Never true  . Ran Out of Food in the Last Year: Never true  Transportation Needs: No Transportation Needs  . Lack of Transportation (Medical): No  . Lack of Transportation (Non-Medical): No  Physical Activity: Inactive  . Days of Exercise per Week: 0 days  . Minutes of Exercise per Session: 0 min  Stress: No Stress Concern Present  . Feeling of Stress : Not at all  Social Connections: Socially Isolated  . Frequency of Communication with Friends and Family: Twice a week  . Frequency of Social Gatherings with Friends and Family: Never  . Attends Religious Services: Never  . Active Member of Clubs or Organizations: No  . Attends Archivist Meetings: Never  . Marital Status: Married   Human resources officer Violence: Not At Risk  . Fear of Current or Ex-Partner: No  . Emotionally Abused: No  . Physically Abused: No  . Sexually Abused: No     Current Outpatient Medications:  .  AMBULATORY NON FORMULARY MEDICATION, Please provide cpap supplies: heated tubing, humidifier chamber, disposable filters, Mask/Mask frame/Head gear per patient preference, Disp: 1 Units, Rfl: 0 .  Carbinoxamine Maleate 6 MG TABS, Take 1 tablet by mouth 2 (two) times daily as needed., Disp: 180 tablet, Rfl: 0 .  furosemide (LASIX) 20 MG  tablet, TAKE 1 TABLET BY MOUTH  DAILY (Patient taking differently: Takes it as needed when feet are swollen), Disp: 90 tablet, Rfl: 3 .  gabapentin (NEURONTIN) 300 MG capsule, TAKE 1 CAPSULE BY MOUTH  DURING THE DAY AND 2  CAPSULES BY MOUTH AT  BEDTIME, Disp: 270 capsule, Rfl: 3 .  ipratropium (ATROVENT) 0.03 % nasal spray, Place 2 sprays into both nostrils 2 (two) times daily as needed for rhinitis. (Patient taking differently: Place 2 sprays into both nostrils daily.), Disp: 90 mL, Rfl: 3 .  montelukast (SINGULAIR) 10 MG tablet, Take 1 tablet (10 mg total) by mouth at bedtime., Disp: 90 tablet, Rfl: 3 .  pantoprazole (PROTONIX) 40 MG tablet, TAKE 1 TABLET BY MOUTH  DAILY, Disp: 90 tablet, Rfl: 3 .  rosuvastatin (CRESTOR) 20 MG tablet, Take 1 tablet (20 mg total) by mouth daily., Disp: 90 tablet, Rfl: 3 .  triamcinolone cream (KENALOG) 0.1 %, Apply 1 application topically 2 (two) times daily., Disp: 453.6 g, Rfl: 3 .  rizatriptan (MAXALT) 5 MG tablet, Take 1 tablet (5 mg total) by mouth as needed for migraine. May repeat in 2 hours if needed, Disp: 30 tablet, Rfl: 3 .  tiZANidine (ZANAFLEX) 4 MG tablet, Take 1 tablet (4 mg total) by mouth every 8 (eight) hours as needed for muscle spasms., Disp: 30 tablet, Rfl: 0  EXAM:  VITALS per patient if applicable: BP 500/37   Pulse (!) 51   Wt 175 lb (79.4 kg)   BMI 32.01 kg/m   GENERAL: alert, oriented, appears well and in no  acute distress  HEENT: atraumatic, conjunttiva clear, no obvious abnormalities on inspection of external nose and ears  NECK: normal movements of the head and neck  LUNGS: on inspection no signs of respiratory distress, breathing rate appears normal, no obvious gross SOB, gasping or wheezing  CV: no obvious cyanosis  MS: moves all visible extremities without noticeable abnormality  PSYCH/NEURO: pleasant and cooperative, no obvious depression or anxiety, speech and thought processing grossly intact  ASSESSMENT AND PLAN:  Discussed the following assessment and plan:  Chronic rhinitis Will give trial of carbinoxamine since this is more drying.  Discussed potential side effects.   Referral placed to allergist as well.   Lumbago Tizanidine renewed.  Back rehab exercises mailed.  Discussed if worsening or becoming more frequent we would need to get updated imaging.       I discussed the assessment and treatment plan with the patient. The patient was provided an opportunity to ask questions and all were answered. The patient agreed with the plan and demonstrated an understanding of the instructions.   The patient was advised to call back or seek an in-person evaluation if the symptoms worsen or if the condition fails to improve as anticipated.    [name], DO

## 2020-03-23 NOTE — Progress Notes (Signed)
Symptoms: sneezing, runny nose  back and hip pain

## 2020-03-24 ENCOUNTER — Other Ambulatory Visit: Payer: Self-pay | Admitting: Family Medicine

## 2020-03-24 ENCOUNTER — Other Ambulatory Visit: Payer: Self-pay

## 2020-03-24 ENCOUNTER — Encounter: Payer: Self-pay | Admitting: Family Medicine

## 2020-03-24 DIAGNOSIS — Z13820 Encounter for screening for osteoporosis: Secondary | ICD-10-CM

## 2020-03-24 DIAGNOSIS — M8580 Other specified disorders of bone density and structure, unspecified site: Secondary | ICD-10-CM

## 2020-03-24 DIAGNOSIS — Z1231 Encounter for screening mammogram for malignant neoplasm of breast: Secondary | ICD-10-CM

## 2020-03-24 DIAGNOSIS — M545 Low back pain, unspecified: Secondary | ICD-10-CM

## 2020-03-24 DIAGNOSIS — G8929 Other chronic pain: Secondary | ICD-10-CM

## 2020-03-24 DIAGNOSIS — Z780 Asymptomatic menopausal state: Secondary | ICD-10-CM

## 2020-03-24 MED ORDER — CARBINOXAMINE MALEATE 4 MG PO TABS: ORAL_TABLET | ORAL | 1 refills | Status: DC

## 2020-03-24 NOTE — Telephone Encounter (Signed)
Orders entered to xray at [facility] imaging.

## 2020-03-24 NOTE — Telephone Encounter (Signed)
Will change to 4mg  tabs.  GoodRx shows around $40 for 90 day supply.  She can continue singulair.  Hold cetirizine for now.   Thanks!

## 2020-04-12 ENCOUNTER — Ambulatory Visit (INDEPENDENT_AMBULATORY_CARE_PROVIDER_SITE_OTHER): Payer: Medicare Other | Admitting: Family Medicine

## 2020-04-12 ENCOUNTER — Encounter: Payer: Self-pay | Admitting: Family Medicine

## 2020-04-12 ENCOUNTER — Other Ambulatory Visit: Payer: Self-pay

## 2020-04-12 DIAGNOSIS — J310 Chronic rhinitis: Secondary | ICD-10-CM

## 2020-04-12 DIAGNOSIS — G43009 Migraine without aura, not intractable, without status migrainosus: Secondary | ICD-10-CM

## 2020-04-12 DIAGNOSIS — M545 Low back pain, unspecified: Secondary | ICD-10-CM | POA: Diagnosis not present

## 2020-04-12 DIAGNOSIS — E782 Mixed hyperlipidemia: Secondary | ICD-10-CM | POA: Diagnosis not present

## 2020-04-12 MED ORDER — ROSUVASTATIN CALCIUM 20 MG PO TABS: 20.0000 mg | ORAL_TABLET | Freq: Every day | ORAL | 3 refills | Status: DC

## 2020-04-12 NOTE — Assessment & Plan Note (Signed)
She has either had reactions of been uncusscesful with several medications including topiramate, tricyclincs and other triptans.  She'll continue maxalt as needed.

## 2020-04-12 NOTE — Progress Notes (Signed)
Christina Hayes - 70 y.o. female MRN [id number]  Date of birth: Nov 11, 1950  Subjective Chief Complaint  Patient presents with  . Follow-up    HPI Christina Hayes is a 70 y.o. female here today for follow up visit.  She has a history of chronic asthma and allergies, migraines, HLD.   She continues to struggle with allergies and post nasal drainage.  She has tried several antihistamines and nasal sprays without significant relief.  We discussed trying carbinoxamine as this may be more drying for her symptoms however cost for initial rx was too much.  New dose sent over but she has not picked up yet.  She does have visit with allergist in a few weeks.   Migraines remain fairly well controlled with maxalt as needed.   She is tolerating crestor well for treatment of HLD.    She continues to have low back pain.  This comes and goes and back does catch at times.  Denies radiation of pain, numbness or tingling.  She has xray tomorrow.   ROS:  A comprehensive ROS was completed and negative except as noted per HPI  Allergies  Allergen Reactions  . Trazodone And Nefazodone Other (See Comments)    Nausea, fatique and sweating  . Codeine Itching    *Can take Hydrocodone*  . Protriptyline Palpitations  . Tramadol Other (See Comments)    headache  . Vivactil [Protriptyline Hcl] Palpitations    Past Medical History:  Diagnosis Date  . Acid reflux   . Allergic rhinitis, cause unspecified   . Allergy   . Anemia    years ago   . Cataract   . Complication of anesthesia SLOW TO WAKE  . Cough   . DJD (degenerative joint disease)    spine  . Frequency of urination   . H/O hiatal hernia    small  . Head cold   . Headache(784.0)    migraqine- uses essential oils  . HYPERLIPIDEMIA, MIXED 12/03/2006   PT STATES NOT TAKING MED AS PRESCRIBED  . MIGRAINES, HX OF 12/03/2006  . Neuromuscular disorder ([place])   . Nocturia   . OSA on CPAP   . Osteopenia   . PONV (postoperative nausea and vomiting)     in past  . Shortness of breath   . Sleep apnea    cpap  . SUI (stress urinary incontinence, female)   . Urgency of urination   . VERTIGO, HX OF 12/03/2006    Past Surgical History:  Procedure Laterality Date  . COLONOSCOPY    . PUBOVAGINAL SLING  04/30/2011   Procedure: Gaynelle Arabian;  Surgeon: Bernestine Amass, MD;  Location: [facility];  Service: Urology;  Laterality: N/A;  sub urethral sling    possible ower  . RIGID BRONCHOSCOPY N/A 04/23/2013   Procedure: LASER BRONCHOSCOPY;  Surgeon: Melrose Nakayama, MD;  Location: [place];  Service: Thoracic;  Laterality: N/A;  . [place]  . VIDEO BRONCHOSCOPY Bilateral 03/26/2013   Procedure: VIDEO BRONCHOSCOPY WITHOUT FLUORO;  Surgeon: Tanda Rockers, MD;  [facility];  Service: Cardiopulmonary;  Laterality: Bilateral;  . VIDEO BRONCHOSCOPY N/A 04/23/2013   Procedure: VIDEO BRONCHOSCOPY;  Surgeon: Melrose Nakayama, MD;  Location: [place];  Service: Thoracic;  Laterality: N/A;  . VIDEO BRONCHOSCOPY N/A 05/28/2014   Procedure: VIDEO BRONCHOSCOPY;  Surgeon: Melrose Nakayama, MD;  Location: [place];  Service: Thoracic;  Laterality: N/A;  Social History   Socioeconomic History  . Marital status: Married    Spouse name: Alvester Chou  . Number of children: 1  . Years of education: 24  . Highest education level: 12th grade  Occupational History  . Occupation: Retired    Comment: Emergency planning/management officer  Tobacco Use  . Smoking status: Never Smoker  . Smokeless tobacco: Never Used  Vaping Use  . Vaping Use: Never used  Substance and Sexual Activity  . Alcohol use: No  . Drug use: No  . Sexual activity: Not Currently    Partners: Male    Birth control/protection: Surgical  Other Topics Concern  . 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 Checks emails. Plays games on computer, reads and shops online. Her hobbies include reading and art.   Social Determinants of Health   Financial Resource Strain: Low Risk   . Difficulty of Paying Living Expenses: Not hard at all  Food Insecurity: No Food Insecurity  . Worried About Charity fundraiser in the Last Year: Never true  . Ran Out of Food in the Last Year: Never true  Transportation Needs: No Transportation Needs  . Lack of Transportation (Medical): No  . Lack of Transportation (Non-Medical): No  Physical Activity: Inactive  . Days of Exercise per Week: 0 days  . Minutes of Exercise per Session: 0 min  Stress: No Stress Concern Present  . Feeling of Stress : Not at all  Social Connections: Socially Isolated  . Frequency of Communication with Friends and Family: Twice a week  . Frequency of Social Gatherings with Friends and Family: Never  . Attends Religious Services: Never  . Active Member of Clubs or Organizations: No  . Attends Archivist Meetings: Never  . Marital Status: Married    Family History  Problem Relation Age of Onset  . Cancer Mother        Colon Cancer survivor  . Diabetes Mother   . Colon cancer Mother   . Diabetes Father   . Hyperlipidemia Father   . Alzheimer's disease Father   . Cancer Maternal Grandmother        Breast Cancer  . Breast cancer Maternal Grandmother   . Cancer Daughter 65       stage III-B breast CA  . Breast cancer Daughter   . Colon polyps Brother   . Allergies Daughter   . Esophageal cancer Neg Hx   . Rectal cancer Neg Hx   . Stomach cancer Neg Hx     Health Maintenance  Topic Date Due  . COVID-19 Vaccine (4 - Booster for Pfizer series) 06/02/2020  . MAMMOGRAM  04/16/2021  . COLONOSCOPY (Pts 45-76yrs Insurance coverage will need to be confirmed)  03/06/2023  . TETANUS/TDAP  01/27/2024  . INFLUENZA VACCINE  Completed  . DEXA SCAN   Completed  . Hepatitis C Screening  Completed  . PNA vac Low Risk Adult  Completed  . HPV VACCINES  Aged Out     ----------------------------------------------------------------------------------------------------------------------------------------------------------------------------------------------------------------- Physical Exam BP 127/66 (BP Location: Left Arm, Patient Position: Sitting, Cuff Size: Normal)   Pulse 65   Temp 97.7 F (36.5 C)   Ht 5\' 2"  (1.575 m)   Wt 177 lb 4.8 oz (80.4 kg)   SpO2  98%   BMI 32.43 kg/m   Physical Exam Constitutional:      Appearance: Normal appearance.  HENT:     Head: Normocephalic and atraumatic.  Eyes:     General: No scleral icterus. Cardiovascular:     Rate and Rhythm: Normal rate and regular rhythm.  Pulmonary:     Effort: Pulmonary effort is normal.     Breath sounds: Normal breath sounds.  Musculoskeletal:     Cervical back: Neck supple.  Neurological:     General: No focal deficit present.     Mental Status: She is alert.  Psychiatric:        Mood and Affect: Mood normal.        Behavior: Behavior normal.     ------------------------------------------------------------------------------------------------------------------------------------------------------------------------------------------------------------------- Assessment and Plan  Migraine She has either had reactions of been uncusscesful with several medications including topiramate, tricyclincs and other triptans.  She'll continue maxalt as needed.   Lumbago Has back xrays tomorrow.  She'll continue anti-inflammatory and muscle relaxer as needed.  Continue home exercises, may need to add on formal PT if not improving.   HLD (hyperlipidemia) Lab Results  Component Value Date   LDLCALC 64 10/14/2019  she is doing well with Crestor, continue at current strength.    Chronic rhinitis She has not had much improvement with several medications.  Has upcoming  appt with allergist.  Will hold off on adding additional medication as she will need to hold antihistamines prior to visit to allergist.     Meds ordered this encounter  Medications  . rosuvastatin (CRESTOR) 20 MG tablet    Sig: Take 1 tablet (20 mg total) by mouth daily.    Dispense:  90 tablet    Refill:  3    No follow-ups on file.    This visit occurred during the SARS-CoV-2 public health emergency.  Safety protocols were in place, including screening questions prior to the visit, additional usage of staff PPE, and extensive cleaning of exam room while observing appropriate contact time as indicated for disinfecting solutions.

## 2020-04-12 NOTE — Patient Instructions (Signed)
Great to see you today! Continue current medications until seen by allergist.   Continue exercises for back and we'll be in touch with xray results.   We can add on formal physical therapy if not improving as well.

## 2020-04-12 NOTE — Assessment & Plan Note (Signed)
Lab Results  Component Value Date   LDLCALC 64 10/14/2019  she is doing well with Crestor, continue at current strength.

## 2020-04-12 NOTE — Assessment & Plan Note (Signed)
Has back xrays tomorrow.  She'll continue anti-inflammatory and muscle relaxer as needed.  Continue home exercises, may need to add on formal PT if not improving.

## 2020-04-12 NOTE — Assessment & Plan Note (Signed)
She has not had much improvement with several medications.  Has upcoming appt with allergist.  Will hold off on adding additional medication as she will need to hold antihistamines prior to visit to allergist.

## 2020-04-13 ENCOUNTER — Ambulatory Visit (INDEPENDENT_AMBULATORY_CARE_PROVIDER_SITE_OTHER): Payer: Medicare Other

## 2020-04-13 DIAGNOSIS — M810 Age-related osteoporosis without current pathological fracture: Secondary | ICD-10-CM | POA: Diagnosis not present

## 2020-04-13 DIAGNOSIS — M8580 Other specified disorders of bone density and structure, unspecified site: Secondary | ICD-10-CM | POA: Diagnosis not present

## 2020-04-13 DIAGNOSIS — M545 Low back pain, unspecified: Secondary | ICD-10-CM | POA: Diagnosis not present

## 2020-04-13 DIAGNOSIS — Z780 Asymptomatic menopausal state: Secondary | ICD-10-CM

## 2020-04-13 DIAGNOSIS — Z13820 Encounter for screening for osteoporosis: Secondary | ICD-10-CM

## 2020-04-13 DIAGNOSIS — G8929 Other chronic pain: Secondary | ICD-10-CM | POA: Diagnosis not present

## 2020-04-18 ENCOUNTER — Telehealth (INDEPENDENT_AMBULATORY_CARE_PROVIDER_SITE_OTHER): Payer: Medicare Other | Admitting: Family Medicine

## 2020-04-18 ENCOUNTER — Encounter: Payer: Self-pay | Admitting: Family Medicine

## 2020-04-18 VITALS — Ht 62.0 in | Wt 174.0 lb

## 2020-04-18 DIAGNOSIS — G8929 Other chronic pain: Secondary | ICD-10-CM | POA: Diagnosis not present

## 2020-04-18 DIAGNOSIS — M810 Age-related osteoporosis without current pathological fracture: Secondary | ICD-10-CM | POA: Diagnosis not present

## 2020-04-18 DIAGNOSIS — M545 Low back pain, unspecified: Secondary | ICD-10-CM

## 2020-04-18 NOTE — Progress Notes (Signed)
Christina Hayes - 70 y.o. female MRN [id number]  Date of birth: [birth year]   This visit type was conducted due to national recommendations for restrictions regarding the COVID-19 Pandemic (e.g. social distancing).  This format is felt to be most appropriate for this patient at this time.  All issues noted in this document were discussed and addressed.  No physical exam was performed (except for noted visual exam findings with Video Visits).  I discussed the limitations of evaluation and management by telemedicine and the availability of in person appointments. The patient expressed understanding and agreed to proceed.  I connected with@ on 04/18/20 at 11:30 AM EDT by a video enabled telemedicine application and verified that I am speaking with the correct person using two identifiers.  Present at visit: [name], DO [name]   Patient Location: home [place] [id number]   Provider location:   [facility]  Chief Complaint  Patient presents with  . Results    HPI  Christina Hayes is a 70 y.o. female who presents via audio/video conferencing for a telehealth visit today.  She is following up today on recent imaging studies.    She recently had DEXA showing osteoporosis with t-score of -2.5 at the L spine.  This is decreased from her previous study.  She is not taking calcium and vitamin d regularly.  No fractures recently.    She also had lumbar spine films showing degenerative changes.  She has tried tizanidine and ibuprofen as needed for flares.  She has not tried PT.    ROS:  A comprehensive ROS was completed and negative except as noted per HPI     Past Medical History:  Diagnosis Date  . Acid reflux   . Allergic rhinitis, cause unspecified   . Allergy   . Anemia    years ago   . Cataract   . Complication of anesthesia SLOW TO WAKE  . Cough   . DJD (degenerative joint disease)    spine  . Frequency of urination   . H/O hiatal hernia    small   . Head cold   . Headache(784.0)    migraqine- uses essential oils  . HYPERLIPIDEMIA, MIXED 12/03/2006   PT STATES NOT TAKING MED AS PRESCRIBED  . MIGRAINES, HX OF 12/03/2006  . Neuromuscular disorder ([place])   . Nocturia   . OSA on CPAP   . Osteopenia   . PONV (postoperative nausea and vomiting)    in past  . Shortness of breath   . Sleep apnea    cpap  . SUI (stress urinary incontinence, female)   . Urgency of urination   . VERTIGO, HX OF 12/03/2006    Past Surgical History:  Procedure Laterality Date  . COLONOSCOPY    . PUBOVAGINAL SLING  04/30/2011   Procedure: Gaynelle Arabian;  Surgeon: Bernestine Amass, MD;  [facility];  Service: Urology;  Laterality: N/A;  sub urethral sling    possible ower  . RIGID BRONCHOSCOPY N/A 04/23/2013   Procedure: LASER BRONCHOSCOPY;  Surgeon: Melrose Nakayama, MD;  Location: [place];  Service: Thoracic;  Laterality: N/A;  . [place]  . VIDEO BRONCHOSCOPY Bilateral 03/26/2013   Procedure: VIDEO BRONCHOSCOPY WITHOUT FLUORO;  Surgeon: Tanda Rockers, MD;  Location: [facility];  Service: Cardiopulmonary;  Laterality: Bilateral;  . VIDEO BRONCHOSCOPY N/A 04/23/2013   Procedure: VIDEO BRONCHOSCOPY;  Surgeon: Melrose Nakayama, MD;  Location: [place];  Service: Thoracic;  Laterality: N/A;  . VIDEO BRONCHOSCOPY N/A 05/28/2014   Procedure: VIDEO BRONCHOSCOPY;  Surgeon: Melrose Nakayama, MD;  Location[facility];  Service: Thoracic;  Laterality: N/A;    Family History  Problem Relation Age of Onset  . Cancer Mother        Colon Cancer survivor  . Diabetes Mother   . Colon cancer Mother   . Diabetes Father   . Hyperlipidemia Father   . Alzheimer's disease Father   . Cancer Maternal Grandmother        Breast Cancer  . Breast cancer Maternal Grandmother   . Cancer Daughter 30       stage III-B breast CA  . Breast cancer Daughter   . Colon polyps Brother   .  Allergies Daughter   . Esophageal cancer Neg Hx   . Rectal cancer Neg Hx   . Stomach cancer Neg Hx     Social History   Socioeconomic History  . Marital status: Married    Spouse name: Alvester Chou  . Number of children: 1  . Years of education: 70  . Highest education level: 12th grade  Occupational History  . Occupation: Retired    Comment: Emergency planning/management officer  Tobacco Use  . Smoking status: Never Smoker  . Smokeless tobacco: Never Used  Vaping Use  . Vaping Use: Never used  Substance and Sexual Activity  . Alcohol use: No  . Drug use: No  . Sexual activity: Not Currently    Partners: Male    Birth control/protection: Surgical  Other Topics Concern  . 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 Checks emails. Plays games on computer, reads and shops online. Her hobbies include reading and art.   Social Determinants of Health   Financial Resource Strain: Low Risk   . Difficulty of Paying Living Expenses: Not hard at all  Food Insecurity: No Food Insecurity  . Worried About Charity fundraiser in the Last Year: Never true  . Ran Out of Food in the Last Year: Never true  Transportation Needs: No Transportation Needs  . Lack of Transportation (Medical): No  . Lack of Transportation (Non-Medical): No  Physical Activity: Inactive  . Days of Exercise per Week: 0 days  . Minutes of Exercise per Session: 0 min  Stress: No Stress Concern Present  . Feeling of Stress : Not at all  Social Connections: Socially Isolated  . Frequency of Communication with Friends and Family: Twice a week  . Frequency of Social Gatherings with Friends and Family: Never  . Attends Religious Services: Never  . Active Member of Clubs or Organizations: No  . Attends Archivist Meetings: Never  . Marital Status:  Married  Human resources officer Violence: Not At Risk  . Fear of Current or Ex-Partner: No  . Emotionally Abused: No  . Physically Abused: No  . Sexually Abused: No     Current Outpatient Medications:  .  AMBULATORY NON FORMULARY MEDICATION, Please provide cpap supplies: heated tubing, humidifier chamber, disposable filters, Mask/Mask frame/Head gear per patient preference, Disp: 1 Units, Rfl: 0 .  furosemide (LASIX) 20 MG tablet, TAKE 1 TABLET BY MOUTH  DAILY (Patient taking differently: Take  20 mg by mouth as needed for edema. Takes it as needed when feet are swollen), Disp: 90 tablet, Rfl: 3 .  gabapentin (NEURONTIN) 300 MG capsule, TAKE 1 CAPSULE BY MOUTH  DURING THE DAY AND 2  CAPSULES BY MOUTH AT  BEDTIME, Disp: 270 capsule, Rfl: 3 .  ipratropium (ATROVENT) 0.03 % nasal spray, Place 2 sprays into both nostrils 2 (two) times daily as needed for rhinitis. (Patient taking differently: Place 2 sprays into both nostrils daily.), Disp: 90 mL, Rfl: 3 .  montelukast (SINGULAIR) 10 MG tablet, Take 1 tablet (10 mg total) by mouth at bedtime., Disp: 90 tablet, Rfl: 3 .  pantoprazole (PROTONIX) 40 MG tablet, TAKE 1 TABLET BY MOUTH  DAILY, Disp: 90 tablet, Rfl: 3 .  rosuvastatin (CRESTOR) 20 MG tablet, Take 1 tablet (20 mg total) by mouth daily., Disp: 90 tablet, Rfl: 3 .  tiZANidine (ZANAFLEX) 4 MG tablet, Take 1 tablet (4 mg total) by mouth every 8 (eight) hours as needed for muscle spasms., Disp: 30 tablet, Rfl: 0 .  triamcinolone cream (KENALOG) 0.1 %, Apply 1 application topically 2 (two) times daily., Disp: 453.6 g, Rfl: 3 .  rizatriptan (MAXALT) 5 MG tablet, Take 1 tablet (5 mg total) by mouth as needed for migraine. May repeat in 2 hours if needed, Disp: 30 tablet, Rfl: 3  EXAM:  VITALS per patient if applicable: Ht 5\' 2"  (1.575 m)   Wt 174 lb (78.9 kg)   BMI 31.83 kg/m   GENERAL: alert, oriented, appears well and in no acute distress  HEENT: atraumatic, conjunttiva clear, no obvious  abnormalities on inspection of external nose and ears  NECK: normal movements of the head and neck  LUNGS: on inspection no signs of respiratory distress, breathing rate appears normal, no obvious gross SOB, gasping or wheezing  CV: no obvious cyanosis  MS: moves all visible extremities without noticeable abnormality  PSYCH/NEURO: pleasant and cooperative, no obvious depression or anxiety, speech and thought processing grossly intact  ASSESSMENT AND PLAN:  Discussed the following assessment and plan:  Osteoporosis Discussed treatment options including bisphosphonate.  She does have hiatal hernia and gerd but this well controlled but other options may be more tolerable for her.  Discussed that we need to be sure that calcium and vitamin d levels are optimal.  Labs orders  Lumbago Xray with degenerative changes.  Referral placed for PT.  She'll follow up afterwards to re-assess whether this has been helpful.      I discussed the assessment and treatment plan with the patient. The patient was provided an opportunity to ask questions and all were answered. The patient agreed with the plan and demonstrated an understanding of the instructions.   The patient was advised to call back or seek an in-person evaluation if the symptoms worsen or if the condition fails to improve as anticipated.    [name], DO

## 2020-04-18 NOTE — Assessment & Plan Note (Signed)
Xray with degenerative changes.  Referral placed for PT.  She'll follow up afterwards to re-assess whether this has been helpful.

## 2020-04-18 NOTE — Assessment & Plan Note (Signed)
Discussed treatment options including bisphosphonate.  She does have hiatal hernia and gerd but this well controlled but other options may be more tolerable for her.  Discussed that we need to be sure that calcium and vitamin d levels are optimal.  Labs orders

## 2020-04-19 ENCOUNTER — Encounter: Payer: Self-pay | Admitting: Family Medicine

## 2020-04-26 ENCOUNTER — Ambulatory Visit (HOSPITAL_BASED_OUTPATIENT_CLINIC_OR_DEPARTMENT_OTHER): Payer: Medicare Other | Attending: Family Medicine | Admitting: Physical Therapy

## 2020-04-26 ENCOUNTER — Encounter (HOSPITAL_BASED_OUTPATIENT_CLINIC_OR_DEPARTMENT_OTHER): Payer: Self-pay | Admitting: Physical Therapy

## 2020-04-26 ENCOUNTER — Other Ambulatory Visit (HOSPITAL_BASED_OUTPATIENT_CLINIC_OR_DEPARTMENT_OTHER)
Admission: RE | Admit: 2020-04-26 | Discharge: 2020-04-26 | Disposition: A | Discharge status: Home / Self Care | Payer: Medicare Other | Source: Ambulatory Visit | Attending: Family Medicine | Admitting: Family Medicine

## 2020-04-26 ENCOUNTER — Other Ambulatory Visit: Payer: Self-pay

## 2020-04-26 DIAGNOSIS — M6281 Muscle weakness (generalized): Secondary | ICD-10-CM | POA: Insufficient documentation

## 2020-04-26 DIAGNOSIS — M810 Age-related osteoporosis without current pathological fracture: Secondary | ICD-10-CM | POA: Diagnosis not present

## 2020-04-26 DIAGNOSIS — G8929 Other chronic pain: Secondary | ICD-10-CM | POA: Diagnosis not present

## 2020-04-26 DIAGNOSIS — M545 Low back pain, unspecified: Secondary | ICD-10-CM | POA: Insufficient documentation

## 2020-04-26 DIAGNOSIS — R293 Abnormal posture: Secondary | ICD-10-CM | POA: Insufficient documentation

## 2020-04-26 DIAGNOSIS — R252 Cramp and spasm: Secondary | ICD-10-CM | POA: Insufficient documentation

## 2020-04-26 LAB — COMPREHENSIVE METABOLIC PANEL
ALT: 18 U/L (ref 0–44)
AST: 24 U/L (ref 15–41)
Albumin: 4.3 g/dL (ref 3.5–5.0)
Alkaline Phosphatase: 94 U/L (ref 38–126)
Anion gap: 7 (ref 5–15)
BUN: 11 mg/dL (ref 8–23)
CO2: 29 mmol/L (ref 22–32)
Calcium: 9.6 mg/dL (ref 8.9–10.3)
Chloride: 104 mmol/L (ref 98–111)
Creatinine, Ser: 0.75 mg/dL (ref 0.44–1.00)
GFR, Estimated: >60 mL/min (ref >60)
Glucose, Bld: 87 mg/dL (ref 70–99)
Potassium: 4.1 mmol/L (ref 3.5–5.1)
Sodium: 140 mmol/L (ref 135–145)
Total Bilirubin: 0.4 mg/dL (ref 0.3–1.2)
Total Protein: 7.5 g/dL (ref 6.5–8.1)

## 2020-04-26 LAB — VITAMIN D 25 HYDROXY (VIT D DEFICIENCY, FRACTURES): Vit D, 25-Hydroxy: 24.7 ng/mL — ABNORMAL LOW (ref 30–100)

## 2020-04-26 NOTE — Therapy (Signed)
[facility] [place], [place], [address] Phone: [phone]   Fax:  [phone]  Physical Therapy Evaluation  Patient Details  Name: Christina Hayes MRN: [id number] Date of Birth: 06/15/1950 Referring Provider (PT): [name], DO   Encounter Date: 04/26/2020   PT End of Session - 04/26/20 1515    Visit Number 1    Number of Visits 13    Date for PT Re-Evaluation 06/10/20    Authorization Type UHC MCR    Progress Note Due on Visit 10    PT Start Time 9935    PT Stop Time 1559    PT Time Calculation (min) 44 min    Activity Tolerance Patient tolerated treatment well    Behavior During Therapy [facility] for tasks assessed/performed           Past Medical History:  Diagnosis Date  . Acid reflux   . Allergic rhinitis, cause unspecified   . Allergy   . Anemia    years ago   . Cataract   . Complication of anesthesia SLOW TO WAKE  . Cough   . DJD (degenerative joint disease)    spine  . Frequency of urination   . H/O hiatal hernia    small  . Head cold   . Headache(784.0)    migraqine- uses essential oils  . HYPERLIPIDEMIA, MIXED 12/03/2006   PT STATES NOT TAKING MED AS PRESCRIBED  . MIGRAINES, HX OF 12/03/2006  . Neuromuscular disorder ([place])   . Nocturia   . OSA on CPAP   . Osteopenia   . PONV (postoperative nausea and vomiting)    in past  . Shortness of breath   . Sleep apnea    cpap  . SUI (stress urinary incontinence, female)   . Urgency of urination   . VERTIGO, HX OF 12/03/2006    Past Surgical History:  Procedure Laterality Date  . COLONOSCOPY    . PUBOVAGINAL SLING  04/30/2011   Procedure: Gaynelle Arabian;  Surgeon: Bernestine Amass, MD;  Location: [facility];  Service: Urology;  Laterality: N/A;  sub urethral sling    possible ower  . RIGID BRONCHOSCOPY N/A 04/23/2013   Procedure: LASER BRONCHOSCOPY;  Surgeon: Melrose Nakayama, MD;  Location: [place];  Service:  Thoracic;  Laterality: N/A;  . [place]  . VIDEO BRONCHOSCOPY Bilateral 03/26/2013   Procedure: VIDEO BRONCHOSCOPY WITHOUT FLUORO;  Surgeon: Tanda Rockers, MD;  Location[facility];  Service: Cardiopulmonary;  Laterality: Bilateral;  . VIDEO BRONCHOSCOPY N/A 04/23/2013   Procedure: VIDEO BRONCHOSCOPY;  Surgeon: Melrose Nakayama, MD;  Location: [place];  Service: Thoracic;  Laterality: N/A;  . VIDEO BRONCHOSCOPY N/A 05/28/2014   Procedure: VIDEO BRONCHOSCOPY;  Surgeon: Melrose Nakayama, MD;  Location: [place];  Service: Thoracic;  Laterality: N/A;    There were no vitals filed for this visit.    Subjective Assessment - 04/26/20 1517    Subjective I couldn't move after I bent over for weeks, I babied it and hobbled until he put me on muscle relaxers. This has happened about 3 times in the last year or so. It was just the lower back but I am noticing some upper back pain. Across both sides. Muscle relaxers help somewhat for the pain. My balance is good but not great, I have always had some vertigo.    How long can you walk comfortably?  Sometimes feels some pain but not always    Patient Stated Goals walking    Currently in Pain? Yes    Pain Score 2     Pain Location Back    Pain Orientation Lower    Pain Descriptors / Indicators Other (Comment)   it's just there   Pain Type Chronic pain    Aggravating Factors  bending forward    Pain Relieving Factors muscle relaxers, rest              [facility] PT Assessment - 04/26/20 0001      Assessment   Medical Diagnosis LBP    Referring Provider (PT) [name], DO    Onset Date/Surgical Date --   multiple episodes over the last year   Hand Dominance Right    Prior Therapy no      Precautions   Precautions None      Restrictions   Weight Bearing Restrictions No      Balance Screen   Has the patient fallen in the past 6 months No      [place] residence    Living Arrangements Spouse/significant other    Additional Comments no stairs      Prior Function   Level of Independence Independent    Vocation Retired      Associate Professor   Overall Cognitive Status Within Functional Limits for tasks assessed      Sensation   Additional Comments [facility]      Posture/Postural Control   Posture Comments incr kyphosis, Lt SIJ elevated Vs Rt      ROM / Strength   AROM / PROM / Strength Strength      Strength   Strength Assessment Site Hip    Right/Left Hip Right;Left    Right Hip Flexion 5/5    Right Hip ABduction 4/5    Left Hip Flexion 5/5    Left Hip ABduction 4-/5      Palpation   Palpation comment LT SIJ TTP, tightness bil piriformis                      Objective measurements completed on examination: See above findings.       [place] Adult PT Treatment/Exercise - 04/26/20 0001      Exercises   Exercises Knee/Hip      Knee/Hip Exercises: Stretches   Passive Hamstring Stretch Limitations supine with strap- midline & laterl bias    Piriformis Stretch Limitations supine pull across    Other Knee/Hip Stretches LTR      Knee/Hip Exercises: Seated   Other Seated Knee/Hip Exercises seated ab set      Knee/Hip Exercises: Supine   Other Supine Knee/Hip Exercises ab set with ball squeeze      Manual Therapy   Manual Therapy Soft tissue mobilization    Soft tissue mobilization bil post hip                  PT Education - 04/26/20 1701    Education Details anatomy of condition, POC, HEP, exercise form/rationale    Person(s) Educated Patient    Methods Explanation;Demonstration;Tactile cues;Verbal cues;Handout    Comprehension Verbalized understanding;Returned demonstration;Verbal cues required;Tactile cues required;Need further instruction            PT Short Term Goals - 04/26/20 1657      PT SHORT TERM GOAL #1   Title pt will demo good core control in exercises  Baseline  began educating at eval    Time 3    Period Weeks    Status New    Target Date 05/17/20      PT SHORT TERM GOAL #2   Title pt will demo proper hip hinge in seated and standing    Baseline will educate as appropriate    Time 3    Period Weeks    Status New    Target Date 05/17/20             PT Long Term Goals - 04/26/20 1654      PT LONG TERM GOAL #1   Title pt will demo 5/5 gross hip strength    Baseline see flowsheet    Time 6    Period Weeks    Status New    Target Date 06/10/20      PT LONG TERM GOAL #2   Title pt will demo proper bending and lifting technique to avoid future injury    Baseline will progress as appropriate    Time 6    Period Weeks    Status New    Target Date 06/10/20      PT LONG TERM GOAL #3   Title pt will have an established walking program with understanding of progressions over time    Baseline will establish as appropriate    Time 6    Period Weeks    Status New    Target Date 06/10/20      PT LONG TERM GOAL #4   Title pt will verbalize feeling an improvement in her balance control in ADLs    Baseline reports feeling that her balance has never really been good    Time 6    Period Weeks    Status New    Target Date 06/10/20                  Plan - 04/26/20 1638    Clinical Impression Statement Pt presents to PT with complaints of chronic back pain that is on and off that seems to be brought on by forward flexion. significant tightness in hip musculature and poor core activation. Pt reports history of multiple abdominal surgeries. Denies regular stretching and exercise program at this time. Began establishing stretching program and activation of core at eval. Pt will benefit from skilled PT in order to address deficitsand meet functional goals.    Personal Factors and Comorbidities Fitness;Time since onset of injury/illness/exacerbation;Comorbidity 3+    Comorbidities osteopenia, h/o vertigo, h/o multiple abdominal surgeries     Examination-Activity Limitations Squat;Bed Mobility;Stairs;Stand;Bend;Locomotion Level;Carry;Sit;Sleep    Examination-Participation Restrictions Laundry;Cleaning;Meal Prep;Community Activity    Stability/Clinical Decision Making Evolving/Moderate complexity    Clinical Decision Making Moderate    Rehab Potential Good    PT Frequency 2x / week    PT Duration 6 weeks    PT Treatment/Interventions ADLs/Self Care Home Management;Cryotherapy;Electrical Stimulation;Iontophoresis 4mg /ml Dexamethasone;Moist Heat;Therapeutic activities;Functional mobility training;Therapeutic exercise;Neuromuscular re-education;Patient/family education;Balance training;Dry needling;Passive range of motion;Manual techniques;Taping;Vestibular;Aquatic Therapy    PT Next Visit Plan progress core activation, cont manual to hips    PT Home Exercise Plan [id number]    Consulted and Agree with Plan of Care Patient           Patient will benefit from skilled therapeutic intervention in order to improve the following deficits and impairments:  Increased muscle spasms,Improper body mechanics,Decreased activity tolerance,Decreased strength,Postural dysfunction,Pain  Visit Diagnosis: Chronic bilateral low back pain without sciatica - Plan: PT plan  of care cert/re-cert  Abnormal posture - Plan: PT plan of care cert/re-cert  Muscle weakness (generalized) - Plan: PT plan of care cert/re-cert  Cramp and spasm - Plan: PT plan of care cert/re-cert     Problem List Patient Active Problem List   Diagnosis Date Noted  . Osteoporosis 04/18/2020  . Chronic rhinitis 12/28/2019  . Osteoarthritis 10/18/2019  . Weight gain 04/13/2019  . GERD (gastroesophageal reflux disease) 04/13/2019  . Insomnia 04/13/2019  . DJD (degenerative joint disease) of knee 09/06/2015  . Degenerative tear of medial meniscus 09/06/2015  . Severe sleep apnea 07/21/2015  . Osteopenia determined by x-ray 07/20/2015  . Lumbago 06/15/2015  . Left knee  pain 06/10/2015  . Carcinoid tumor 05/18/2014  . Sleep pattern disturbance 01/26/2014  . Obesity, unspecified 03/25/2012  . HLD (hyperlipidemia) 12/03/2006  . VERTIGO, HX OF 12/03/2006  . Migraine 12/03/2006    Leinaala Catanese C. Tonjia Parillo PT, DPT 04/26/20 5:04 PM   [facility] [place], [place], [address] Phone: [phone]   Fax:  [phone]  Name: CHANCE KARAM MRN: [id number] Date of Birth: 09/23/1950

## 2020-04-27 ENCOUNTER — Other Ambulatory Visit: Payer: Self-pay | Admitting: Family Medicine

## 2020-04-27 MED ORDER — VITAMIN D (ERGOCALCIFEROL) 1.25 MG (50000 UNIT) PO CAPS: 50000.0000 [IU] | ORAL_CAPSULE | ORAL | 0 refills | Status: AC

## 2020-05-02 ENCOUNTER — Ambulatory Visit (HOSPITAL_BASED_OUTPATIENT_CLINIC_OR_DEPARTMENT_OTHER): Payer: Medicare Other | Admitting: Physical Therapy

## 2020-05-03 ENCOUNTER — Encounter: Payer: Self-pay | Admitting: Family Medicine

## 2020-05-03 ENCOUNTER — Ambulatory Visit (HOSPITAL_BASED_OUTPATIENT_CLINIC_OR_DEPARTMENT_OTHER): Payer: Medicare Other | Admitting: Physical Therapy

## 2020-05-10 ENCOUNTER — Encounter (HOSPITAL_BASED_OUTPATIENT_CLINIC_OR_DEPARTMENT_OTHER): Payer: Self-pay | Admitting: Physical Therapy

## 2020-05-10 ENCOUNTER — Ambulatory Visit (HOSPITAL_BASED_OUTPATIENT_CLINIC_OR_DEPARTMENT_OTHER): Payer: Medicare Other | Admitting: Physical Therapy

## 2020-05-10 ENCOUNTER — Other Ambulatory Visit: Payer: Self-pay

## 2020-05-10 DIAGNOSIS — R252 Cramp and spasm: Secondary | ICD-10-CM | POA: Diagnosis not present

## 2020-05-10 DIAGNOSIS — R293 Abnormal posture: Secondary | ICD-10-CM

## 2020-05-10 DIAGNOSIS — G8929 Other chronic pain: Secondary | ICD-10-CM

## 2020-05-10 DIAGNOSIS — M545 Low back pain, unspecified: Secondary | ICD-10-CM | POA: Diagnosis not present

## 2020-05-10 DIAGNOSIS — M6281 Muscle weakness (generalized): Secondary | ICD-10-CM | POA: Diagnosis not present

## 2020-05-10 NOTE — Therapy (Signed)
[place], [place], [address] Phone: [phone]   Fax:  [phone]  Physical Therapy Treatment  Patient Details  Name: Christina Hayes MRN: [id number] Date of Birth: 14-Oct-1950 Referring Provider (PT): [name], DO   Encounter Date: 05/10/2020   PT End of Session - 05/10/20 1520    Visit Number 2    Number of Visits 13    Date for PT Re-Evaluation 06/10/20    Authorization Type UHC MCR    PT Start Time 1519    PT Stop Time 1610    PT Time Calculation (min) 51 min    Activity Tolerance Patient tolerated treatment well    Behavior During Therapy [facility] for tasks assessed/performed           Past Medical History:  Diagnosis Date  . Acid reflux   . Allergic rhinitis, cause unspecified   . Allergy   . Anemia    years ago   . Cataract   . Complication of anesthesia SLOW TO WAKE  . Cough   . DJD (degenerative joint disease)    spine  . Frequency of urination   . H/O hiatal hernia    small  . Head cold   . Headache(784.0)    migraqine- uses essential oils  . HYPERLIPIDEMIA, MIXED 12/03/2006   PT STATES NOT TAKING MED AS PRESCRIBED  . MIGRAINES, HX OF 12/03/2006  . Neuromuscular disorder ([place])   . Nocturia   . OSA on CPAP   . Osteopenia   . PONV (postoperative nausea and vomiting)    in past  . Shortness of breath   . Sleep apnea    cpap  . SUI (stress urinary incontinence, female)   . Urgency of urination   . VERTIGO, HX OF 12/03/2006    Past Surgical History:  Procedure Laterality Date  . COLONOSCOPY    . PUBOVAGINAL SLING  04/30/2011   Procedure: Gaynelle Arabian;  Surgeon: Bernestine Amass, MD;  Location: [facility];  Service: Urology;  Laterality: N/A;  sub urethral sling    possible ower  . RIGID BRONCHOSCOPY N/A 04/23/2013   Procedure: LASER BRONCHOSCOPY;  Surgeon: Melrose Nakayama, MD;  Location: [place];  Service: Thoracic;  Laterality: N/A;  .  [place]  . VIDEO BRONCHOSCOPY Bilateral 03/26/2013   Procedure: VIDEO BRONCHOSCOPY WITHOUT FLUORO;  Surgeon: Tanda Rockers, MD;  [facility];  Service: Cardiopulmonary;  Laterality: Bilateral;  . VIDEO BRONCHOSCOPY N/A 04/23/2013   Procedure: VIDEO BRONCHOSCOPY;  Surgeon: Melrose Nakayama, MD;  Location: [place];  Service: Thoracic;  Laterality: N/A;  . VIDEO BRONCHOSCOPY N/A 05/28/2014   Procedure: VIDEO BRONCHOSCOPY;  Surgeon: Melrose Nakayama, MD;  Location: [place];  Service: Thoracic;  Laterality: N/A;    There were no vitals filed for this visit.   Subjective Assessment - 05/10/20 1521    Subjective not bad today.    Patient Stated Goals walking    Currently in Pain? Yes    Pain Score 1     Pain Location Back    Pain Orientation Lower                             [facility] Adult PT Treatment/Exercise - 05/10/20 0001      Knee/Hip Exercises: Stretches   Passive Hamstring Stretch Limitations seated  Piriformis Stretch Limitations seated in chair      Knee/Hip Exercises: Seated   Long Arc Quad Limitations cues for ab set    Other Seated Knee/Hip Exercises seated lat press on physioball    Other Seated Knee/Hip Exercises trunk rotation holding physiobal with core engagement    Marching Limitations cues for ab set    Sit to Sand 10 reps;without UE support   cues for gluts & hip hinge     Modalities   Modalities Iontophoresis;Moist Heat      Moist Heat Therapy   Number Minutes Moist Heat 10 Minutes    Moist Heat Location Lumbar Spine                  PT Education - 05/10/20 1706    Education Details vestibular discussion and connection to LBP    Person(s) Educated Patient    Methods Explanation    Comprehension Verbalized understanding;Need further instruction            PT Short Term Goals - 04/26/20 1657      PT SHORT TERM GOAL #1   Title pt will demo good core  control in exercises    Baseline began educating at eval    Time 3    Period Weeks    Status New    Target Date 05/17/20      PT SHORT TERM GOAL #2   Title pt will demo proper hip hinge in seated and standing    Baseline will educate as appropriate    Time 3    Period Weeks    Status New    Target Date 05/17/20             PT Long Term Goals - 04/26/20 1654      PT LONG TERM GOAL #1   Title pt will demo 5/5 gross hip strength    Baseline see flowsheet    Time 6    Period Weeks    Status New    Target Date 06/10/20      PT LONG TERM GOAL #2   Title pt will demo proper bending and lifting technique to avoid future injury    Baseline will progress as appropriate    Time 6    Period Weeks    Status New    Target Date 06/10/20      PT LONG TERM GOAL #3   Title pt will have an established walking program with understanding of progressions over time    Baseline will establish as appropriate    Time 6    Period Weeks    Status New    Target Date 06/10/20      PT LONG TERM GOAL #4   Title pt will verbalize feeling an improvement in her balance control in ADLs    Baseline reports feeling that her balance has never really been good    Time 6    Period Weeks    Status New    Target Date 06/10/20                 Plan - 05/10/20 1940    Clinical Impression Statement Reports seeing MD for migraines in the past for migraines and is unable to take meds due to heart palpatations. Was told in the past that vertigo is from migraines. Does not have both symptoms together but vertigo symptoms do not seem mechanical to point toward BPPV. Supine stretches did cause her to become dizzy  so we focused in a seated position today. Feels off balance regularly likely contributing to back pain.    PT Treatment/Interventions ADLs/Self Care Home Management;Cryotherapy;Electrical Stimulation;Iontophoresis 4mg /ml Dexamethasone;Moist Heat;Therapeutic activities;Functional mobility  training;Therapeutic exercise;Neuromuscular re-education;Patient/family education;Balance training;Dry needling;Passive range of motion;Manual techniques;Taping;Vestibular;Aquatic Therapy    PT Next Visit Plan cont with core activation in seated, hip hinge    PT Home Exercise Plan [id number]    Consulted and Agree with Plan of Care Patient           Patient will benefit from skilled therapeutic intervention in order to improve the following deficits and impairments:  Increased muscle spasms,Improper body mechanics,Decreased activity tolerance,Decreased strength,Postural dysfunction,Pain  Visit Diagnosis: Chronic bilateral low back pain without sciatica  Abnormal posture  Muscle weakness (generalized)  Cramp and spasm     Problem List Patient Active Problem List   Diagnosis Date Noted  . Osteoporosis 04/18/2020  . Chronic rhinitis 12/28/2019  . Osteoarthritis 10/18/2019  . Weight gain 04/13/2019  . GERD (gastroesophageal reflux disease) 04/13/2019  . Insomnia 04/13/2019  . DJD (degenerative joint disease) of knee 09/06/2015  . Degenerative tear of medial meniscus 09/06/2015  . Severe sleep apnea 07/21/2015  . Osteopenia determined by x-ray 07/20/2015  . Lumbago 06/15/2015  . Left knee pain 06/10/2015  . Carcinoid tumor 05/18/2014  . Sleep pattern disturbance 01/26/2014  . Obesity, unspecified 03/25/2012  . HLD (hyperlipidemia) 12/03/2006  . VERTIGO, HX OF 12/03/2006  . Migraine 12/03/2006    Exavier Lina C. Jacobs Golab PT, DPT 05/10/20 7:45 PM   [place], [place], [address] Phone: [phone]   Fax:  [phone]  Name: BLYTHE VEACH MRN: [id number] Date of Birth: [birth year]

## 2020-05-12 MED ORDER — ROSUVASTATIN CALCIUM 20 MG PO TABS: 20.0000 mg | ORAL_TABLET | Freq: Every day | ORAL | 3 refills | Status: DC

## 2020-05-13 ENCOUNTER — Encounter (HOSPITAL_BASED_OUTPATIENT_CLINIC_OR_DEPARTMENT_OTHER): Payer: Self-pay | Admitting: Physical Therapy

## 2020-05-13 ENCOUNTER — Ambulatory Visit (HOSPITAL_BASED_OUTPATIENT_CLINIC_OR_DEPARTMENT_OTHER): Payer: Medicare Other | Admitting: Physical Therapy

## 2020-05-13 ENCOUNTER — Other Ambulatory Visit: Payer: Self-pay

## 2020-05-13 DIAGNOSIS — G8929 Other chronic pain: Secondary | ICD-10-CM

## 2020-05-13 DIAGNOSIS — M545 Low back pain, unspecified: Secondary | ICD-10-CM | POA: Diagnosis not present

## 2020-05-13 DIAGNOSIS — R252 Cramp and spasm: Secondary | ICD-10-CM | POA: Diagnosis not present

## 2020-05-13 DIAGNOSIS — M6281 Muscle weakness (generalized): Secondary | ICD-10-CM | POA: Diagnosis not present

## 2020-05-13 DIAGNOSIS — R293 Abnormal posture: Secondary | ICD-10-CM | POA: Diagnosis not present

## 2020-05-13 NOTE — Therapy (Signed)
[place], [place], [address] Phone: [phone]   Fax:  [phone]  Physical Therapy Treatment  Patient Details  Name: DIARA CHAUDHARI MRN: [id number] Date of Birth: Feb 08, 1950 Referring Provider (PT): [name], DO   Encounter Date: 05/13/2020   PT End of Session - 05/13/20 1432    Visit Number 3    Number of Visits 13    Date for PT Re-Evaluation 06/10/20    Authorization Type UHC MCR    PT Start Time 9528    PT Stop Time 1513    PT Time Calculation (min) 41 min    Activity Tolerance Patient tolerated treatment well    Behavior During Therapy [facility] for tasks assessed/performed           Past Medical History:  Diagnosis Date  . Acid reflux   . Allergic rhinitis, cause unspecified   . Allergy   . Anemia    years ago   . Cataract   . Complication of anesthesia SLOW TO WAKE  . Cough   . DJD (degenerative joint disease)    spine  . Frequency of urination   . H/O hiatal hernia    small  . Head cold   . Headache(784.0)    migraqine- uses essential oils  . HYPERLIPIDEMIA, MIXED 12/03/2006   PT STATES NOT TAKING MED AS PRESCRIBED  . MIGRAINES, HX OF 12/03/2006  . Neuromuscular disorder ([place])   . Nocturia   . OSA on CPAP   . Osteopenia   . PONV (postoperative nausea and vomiting)    in past  . Shortness of breath   . Sleep apnea    cpap  . SUI (stress urinary incontinence, female)   . Urgency of urination   . VERTIGO, HX OF 12/03/2006    Past Surgical History:  Procedure Laterality Date  . COLONOSCOPY    . PUBOVAGINAL SLING  04/30/2011   Procedure: Gaynelle Arabian;  Surgeon: Bernestine Amass, MD;  Location: [facility];  Service: Urology;  Laterality: N/A;  sub urethral sling    possible ower  . RIGID BRONCHOSCOPY N/A 04/23/2013   Procedure: LASER BRONCHOSCOPY;  Surgeon: Melrose Nakayama, MD;  Location: [place];  Service: Thoracic;  Laterality: N/A;  .  [place]  . VIDEO BRONCHOSCOPY Bilateral 03/26/2013   Procedure: VIDEO BRONCHOSCOPY WITHOUT FLUORO;  Surgeon: Tanda Rockers, MD;  [facility];  Service: Cardiopulmonary;  Laterality: Bilateral;  . VIDEO BRONCHOSCOPY N/A 04/23/2013   Procedure: VIDEO BRONCHOSCOPY;  Surgeon: Melrose Nakayama, MD;  Location: [place];  Service: Thoracic;  Laterality: N/A;  . VIDEO BRONCHOSCOPY N/A 05/28/2014   Procedure: VIDEO BRONCHOSCOPY;  Surgeon: Melrose Nakayama, MD;  Location: [place];  Service: Thoracic;  Laterality: N/A;    There were no vitals filed for this visit.   Subjective Assessment - 05/13/20 1433    Subjective A little pain on right hip. Exercises worked better at home.              [facility] PT Assessment - 05/13/20 0001      Palpation   Palpation comment TTP Rt piriformis                         [facility] Adult PT Treatment/Exercise - 05/13/20 0001      Knee/Hip Exercises: Stretches   Piriformis Stretch Limitations seated in  chair      Knee/Hip Exercises: Standing   Other Standing Knee Exercises eccentric sit- hip hinge, weight shift      Knee/Hip Exercises: Seated   Other Seated Knee/Hip Exercises seated on physioball: pelvic tilt, bouncing, NBOS bouncing, pelvic clocks, hip hinge                    PT Short Term Goals - 04/26/20 1657      PT SHORT TERM GOAL #1   Title pt will demo good core control in exercises    Baseline began educating at eval    Time 3    Period Weeks    Status New    Target Date 05/17/20      PT SHORT TERM GOAL #2   Title pt will demo proper hip hinge in seated and standing    Baseline will educate as appropriate    Time 3    Period Weeks    Status New    Target Date 05/17/20             PT Long Term Goals - 04/26/20 1654      PT LONG TERM GOAL #1   Title pt will demo 5/5 gross hip strength    Baseline see flowsheet    Time 6    Period Weeks     Status New    Target Date 06/10/20      PT LONG TERM GOAL #2   Title pt will demo proper bending and lifting technique to avoid future injury    Baseline will progress as appropriate    Time 6    Period Weeks    Status New    Target Date 06/10/20      PT LONG TERM GOAL #3   Title pt will have an established walking program with understanding of progressions over time    Baseline will establish as appropriate    Time 6    Period Weeks    Status New    Target Date 06/10/20      PT LONG TERM GOAL #4   Title pt will verbalize feeling an improvement in her balance control in ADLs    Baseline reports feeling that her balance has never really been good    Time 6    Period Weeks    Status New    Target Date 06/10/20                 Plan - 05/13/20 1522    Clinical Impression Statement Did very well with hip hinge keeping her back straight but had difficulty shifting weight into heels. Piriformis pain released with stretching- became dizzy when laying on her Lt side so unable to do manual therapy. Seated on physioball for core exercises today to introduce unstable surface. Asked her to try walking this weekend and note distance and any pain to begin walking program.    PT Treatment/Interventions ADLs/Self Care Home Management;Cryotherapy;Electrical Stimulation;Iontophoresis 4mg /ml Dexamethasone;Moist Heat;Therapeutic activities;Functional mobility training;Therapeutic exercise;Neuromuscular re-education;Patient/family education;Balance training;Dry needling;Passive range of motion;Manual techniques;Taping;Vestibular;Aquatic Therapy    PT Next Visit Plan review hip hinge with weight shift, progress core strength    PT Home Exercise Plan [id number]    Consulted and Agree with Plan of Care Patient           Patient will benefit from skilled therapeutic intervention in order to improve the following deficits and impairments:  Increased muscle spasms,Improper body  mechanics,Decreased activity tolerance,Decreased strength,Postural dysfunction,Pain  Visit Diagnosis: Chronic bilateral low back pain without sciatica  Abnormal posture  Muscle weakness (generalized)  Cramp and spasm     Problem List Patient Active Problem List   Diagnosis Date Noted  . Osteoporosis 04/18/2020  . Chronic rhinitis 12/28/2019  . Osteoarthritis 10/18/2019  . Weight gain 04/13/2019  . GERD (gastroesophageal reflux disease) 04/13/2019  . Insomnia 04/13/2019  . DJD (degenerative joint disease) of knee 09/06/2015  . Degenerative tear of medial meniscus 09/06/2015  . Severe sleep apnea 07/21/2015  . Osteopenia determined by x-ray 07/20/2015  . Lumbago 06/15/2015  . Left knee pain 06/10/2015  . Carcinoid tumor 05/18/2014  . Sleep pattern disturbance 01/26/2014  . Obesity, unspecified 03/25/2012  . HLD (hyperlipidemia) 12/03/2006  . VERTIGO, HX OF 12/03/2006  . Migraine 12/03/2006   Jordan Caraveo C. Dequavious Harshberger PT, DPT 05/13/20 3:27 PM   [facility] [place], [place], [address] Phone: [phone]   Fax:  [phone]  Name: ELIDIA BONENFANT MRN: [id number] Date of Birth: 07-Oct-1950

## 2020-05-16 ENCOUNTER — Other Ambulatory Visit: Payer: Self-pay

## 2020-05-16 ENCOUNTER — Ambulatory Visit
Admission: RE | Admit: 2020-05-16 | Discharge: 2020-05-16 | Disposition: A | Discharge status: Home / Self Care | Payer: Medicare Other | Source: Ambulatory Visit | Attending: Family Medicine | Admitting: Family Medicine

## 2020-05-16 DIAGNOSIS — Z1231 Encounter for screening mammogram for malignant neoplasm of breast: Secondary | ICD-10-CM | POA: Diagnosis not present

## 2020-05-17 ENCOUNTER — Encounter (HOSPITAL_BASED_OUTPATIENT_CLINIC_OR_DEPARTMENT_OTHER): Payer: Self-pay | Admitting: Physical Therapy

## 2020-05-17 ENCOUNTER — Ambulatory Visit (HOSPITAL_BASED_OUTPATIENT_CLINIC_OR_DEPARTMENT_OTHER): Payer: Medicare Other | Admitting: Physical Therapy

## 2020-05-17 DIAGNOSIS — G8929 Other chronic pain: Secondary | ICD-10-CM

## 2020-05-17 DIAGNOSIS — R293 Abnormal posture: Secondary | ICD-10-CM

## 2020-05-17 DIAGNOSIS — M6281 Muscle weakness (generalized): Secondary | ICD-10-CM

## 2020-05-17 DIAGNOSIS — R252 Cramp and spasm: Secondary | ICD-10-CM

## 2020-05-17 DIAGNOSIS — M545 Low back pain, unspecified: Secondary | ICD-10-CM | POA: Diagnosis not present

## 2020-05-17 NOTE — Therapy (Signed)
[place], [place], [address] Phone: [phone]   Fax:  [phone]  Physical Therapy Treatment  Patient Details  Name: Christina Hayes MRN: [id number] Date of Birth: [birth year] Referring Provider (PT): [name], DO   Encounter Date: 05/17/2020   PT End of Session - 05/17/20 1519    Visit Number 4    Number of Visits 13    Date for PT Re-Evaluation 06/10/20    Authorization Type UHC MCR    Progress Note Due on Visit 10    PT Start Time 1519    PT Stop Time 1601    PT Time Calculation (min) 42 min    Activity Tolerance Patient tolerated treatment well    Behavior During Therapy [facility] for tasks assessed/performed           Past Medical History:  Diagnosis Date  . Acid reflux   . Allergic rhinitis, cause unspecified   . Allergy   . Anemia    years ago   . Cataract   . Complication of anesthesia SLOW TO WAKE  . Cough   . DJD (degenerative joint disease)    spine  . Frequency of urination   . H/O hiatal hernia    small  . Head cold   . Headache(784.0)    migraqine- uses essential oils  . HYPERLIPIDEMIA, MIXED 12/03/2006   PT STATES NOT TAKING MED AS PRESCRIBED  . MIGRAINES, HX OF 12/03/2006  . Neuromuscular disorder ([place])   . Nocturia   . OSA on CPAP   . Osteopenia   . PONV (postoperative nausea and vomiting)    in past  . Shortness of breath   . Sleep apnea    cpap  . SUI (stress urinary incontinence, female)   . Urgency of urination   . VERTIGO, HX OF 12/03/2006    Past Surgical History:  Procedure Laterality Date  . COLONOSCOPY    . PUBOVAGINAL SLING  04/30/2011   Procedure: Gaynelle Arabian;  Surgeon: Bernestine Amass, MD;  Location: [facility];  Service: Urology;  Laterality: N/A;  sub urethral sling    possible ower  . RIGID BRONCHOSCOPY N/A 04/23/2013   Procedure: LASER BRONCHOSCOPY;  Surgeon: Melrose Nakayama, MD;  Location: [place];  Service:  Thoracic;  Laterality: N/A;  . [place]  . VIDEO BRONCHOSCOPY Bilateral 03/26/2013   Procedure: VIDEO BRONCHOSCOPY WITHOUT FLUORO;  Surgeon: Tanda Rockers, MD;  [facility];  Service: Cardiopulmonary;  Laterality: Bilateral;  . VIDEO BRONCHOSCOPY N/A 04/23/2013   Procedure: VIDEO BRONCHOSCOPY;  Surgeon: Melrose Nakayama, MD;  Location: [place];  Service: Thoracic;  Laterality: N/A;  . VIDEO BRONCHOSCOPY N/A 05/28/2014   Procedure: VIDEO BRONCHOSCOPY;  Surgeon: Melrose Nakayama, MD;  Location: [place];  Service: Thoracic;  Laterality: N/A;    There were no vitals filed for this visit.   Subjective Assessment - 05/17/20 1520    Subjective no dizziness on ball last isit.  I am feeling better    Currently in Pain? No/denies                             [facility] Adult PT Treatment/Exercise - 05/17/20 0001      Knee/Hip Exercises: Stretches   Passive Hamstring Stretch Limitations seated    Piriformis Stretch Limitations seated in chair  Knee/Hip Exercises: Standing   Other Standing Knee Exercises hip hinges 2 lb weight, hip hinge with 10lb weight squat    Other Standing Knee Exercises static stance with head turns on foam with cues for core control, decreasing BOS. hinging      Knee/Hip Exercises: Seated   Sit to Sand without UE support   on foam pad     Knee/Hip Exercises: Sidelying   Hip ABduction AROM;20 reps                  PT Education - 05/17/20 1732    Education Details discussed STGs & prog to 1/week    Person(s) Educated Patient    Methods Explanation    Comprehension Verbalized understanding;Need further instruction            PT Short Term Goals - 05/17/20 1524      PT SHORT TERM GOAL #1   Title pt will demo good core control in exercises      PT SHORT TERM GOAL #2   Title pt will demo proper hip hinge in seated and standing    Baseline able to demonstrate but  does not utilize in ADL's             PT Long Term Goals - 04/26/20 1654      PT LONG TERM GOAL #1   Title pt will demo 5/5 gross hip strength    Baseline see flowsheet    Time 6    Period Weeks    Status New    Target Date 06/10/20      PT LONG TERM GOAL #2   Title pt will demo proper bending and lifting technique to avoid future injury    Baseline will progress as appropriate    Time 6    Period Weeks    Status New    Target Date 06/10/20      PT LONG TERM GOAL #3   Title pt will have an established walking program with understanding of progressions over time    Baseline will establish as appropriate    Time 6    Period Weeks    Status New    Target Date 06/10/20      PT LONG TERM GOAL #4   Title pt will verbalize feeling an improvement in her balance control in ADLs    Baseline reports feeling that her balance has never really been good    Time 6    Period Weeks    Status New    Target Date 06/10/20                 Plan - 05/17/20 1603    Clinical Impression Statement Pt has met her STGs and was able to begin walking program without incr in pain. added foam for increased instability in sit to stand as well as head turns. Will try to progress her walking program and, if feeling good, we will decr freq to 1/week.    PT Treatment/Interventions ADLs/Self Care Home Management;Cryotherapy;Electrical Stimulation;Iontophoresis 16m/ml Dexamethasone;Moist Heat;Therapeutic activities;Functional mobility training;Therapeutic exercise;Neuromuscular re-education;Patient/family education;Balance training;Dry needling;Passive range of motion;Manual techniques;Taping;Vestibular;Aquatic Therapy    PT Next Visit Plan did she incr walking distance, cont core strength    PT Home Exercise Plan [email or web address]   Consulted and Agree with Plan of Care Patient           Patient will benefit from skilled therapeutic intervention in order to improve the following deficits and  impairments:  Increased muscle spasms,Improper body mechanics,Decreased activity tolerance,Decreased strength,Postural dysfunction,Pain  Visit Diagnosis: Chronic bilateral low back pain without sciatica  Abnormal posture  Muscle weakness (generalized)  Cramp and spasm     Problem List Patient Active Problem List   Diagnosis Date Noted  . Osteoporosis 04/18/2020  . Chronic rhinitis 12/28/2019  . Osteoarthritis 10/18/2019  . Weight gain 04/13/2019  . GERD (gastroesophageal reflux disease) 04/13/2019  . Insomnia 04/13/2019  . DJD (degenerative joint disease) of knee 09/06/2015  . Degenerative tear of medial meniscus 09/06/2015  . Severe sleep apnea 07/21/2015  . Osteopenia determined by x-ray 07/20/2015  . Lumbago 06/15/2015  . Left knee pain 06/10/2015  . Carcinoid tumor 05/18/2014  . Sleep pattern disturbance 01/26/2014  . Obesity, unspecified 03/25/2012  . HLD (hyperlipidemia) 12/03/2006  . VERTIGO, HX OF 12/03/2006  . Migraine 12/03/2006    Zynasia Burklow C. Dodd Schmid PT, DPT 05/17/20 5:35 PM   [place], [place], [address] Phone: [phone]   Fax:  [phone]  Name: ALIZZA SACRA MRN: [id number] Date of Birth: 04-Jun-1950

## 2020-05-18 ENCOUNTER — Other Ambulatory Visit: Payer: Self-pay | Admitting: Family Medicine

## 2020-05-18 ENCOUNTER — Encounter: Payer: Self-pay | Admitting: Family Medicine

## 2020-05-18 DIAGNOSIS — R928 Other abnormal and inconclusive findings on diagnostic imaging of breast: Secondary | ICD-10-CM

## 2020-05-18 DIAGNOSIS — N6489 Other specified disorders of breast: Secondary | ICD-10-CM

## 2020-05-19 ENCOUNTER — Ambulatory Visit: Payer: Medicare Other | Admitting: Allergy

## 2020-05-19 ENCOUNTER — Other Ambulatory Visit: Payer: Self-pay

## 2020-05-19 ENCOUNTER — Encounter: Payer: Self-pay | Admitting: Allergy

## 2020-05-19 VITALS — BP 116/64 | HR 74 | Temp 97.7°F | Resp 16 | Ht 61.5 in | Wt 179.2 lb

## 2020-05-19 DIAGNOSIS — J3089 Other allergic rhinitis: Secondary | ICD-10-CM

## 2020-05-19 DIAGNOSIS — H1045 Other chronic allergic conjunctivitis: Secondary | ICD-10-CM

## 2020-05-19 NOTE — Progress Notes (Signed)
New Patient Note  RE: Christina Hayes MRN: [id number] DOB: [birth year] Date of Office Visit: 05/19/2020 Primary care provider: [name], DO  Chief Complaint: Allergies  History of present illness: Christina Hayes is a 69 y.o. female presenting today for evaluation of chronic rhinitis.  She reports having nasal drainage and congestion and sneezing a lot lately and states nothing has helped thus far.   Sometimes has watery eyes.  Has been having these symptoms for years.  Symptoms she reports seems to come and go. Symptoms does not seem to be seasonal.  She takes zyrtec nightly for past 6 months.  She states she alternate the antihistamines claritin, allegra as well.  She feels they all work pretty much the same.  She also takes singulair now for the past month.  She has nasal atrovent spray that she uses in AM and in PM but states if her nasal symptoms are not bothering her she may skip it.  It does help to decrease drainage.  She has used flonase before as well. She has used benadryl as well as needed.   Denies history of sinus infection.  Denies history of asthma, eczema and food allergy.    Review of systems: Review of Systems  Constitutional: Negative.   HENT:       See HPI  Eyes: Negative.   Respiratory: Negative.   Cardiovascular: Negative.   Gastrointestinal: Negative.   Musculoskeletal: Negative.   Skin: Negative.   Neurological: Negative.     All other systems negative unless noted above in HPI  Past medical history: Past Medical History:  Diagnosis Date  . Acid reflux   . Allergic rhinitis, cause unspecified   . Allergy   . Anemia    years ago   . Cataract   . Complication of anesthesia SLOW TO WAKE  . Cough   . DJD (degenerative joint disease)    spine  . Frequency of urination   . H/O hiatal hernia    small  . Head cold   . Headache(784.0)    migraqine- uses essential oils  . HYPERLIPIDEMIA, MIXED 12/03/2006   PT STATES NOT TAKING MED AS PRESCRIBED   . MIGRAINES, HX OF 12/03/2006  . Neuromuscular disorder ([place])   . Nocturia   . OSA on CPAP   . Osteopenia   . PONV (postoperative nausea and vomiting)    in past  . Shortness of breath   . Sleep apnea    cpap  . SUI (stress urinary incontinence, female)   . Urgency of urination   . VERTIGO, HX OF 12/03/2006    Past surgical history: Past Surgical History:  Procedure Laterality Date  . COLONOSCOPY    . PUBOVAGINAL SLING  04/30/2011   Procedure: Gaynelle Arabian;  Surgeon: Bernestine Amass, MD;  Location: [facility];  Service: Urology;  Laterality: N/A;  sub urethral sling    possible ower  . RIGID BRONCHOSCOPY N/A 04/23/2013   Procedure: LASER BRONCHOSCOPY;  Surgeon: Melrose Nakayama, MD;  Location: [place];  Service: Thoracic;  Laterality: N/A;  . [place]  . VIDEO BRONCHOSCOPY Bilateral 03/26/2013   Procedure: VIDEO BRONCHOSCOPY WITHOUT FLUORO;  Surgeon: Tanda Rockers, MD;  Location: [facility];  Service: Cardiopulmonary;  Laterality: Bilateral;  . VIDEO BRONCHOSCOPY N/A 04/23/2013   Procedure: VIDEO BRONCHOSCOPY;  Surgeon: Melrose Nakayama, MD;  Location: [place];  Service: Thoracic;  Laterality: N/A;  .  VIDEO BRONCHOSCOPY N/A 05/28/2014   Procedure: VIDEO BRONCHOSCOPY;  Surgeon: Melrose Nakayama, MD;  Location: [facility] OR;  Service: Thoracic;  Laterality: N/A;    Family history:  Family History  Problem Relation Age of Onset  . Cancer Mother        Colon Cancer survivor  . Diabetes Mother   . Colon cancer Mother   . Diabetes Father   . Hyperlipidemia Father   . Alzheimer's disease Father   . Cancer Maternal Grandmother        Breast Cancer  . Breast cancer Maternal Grandmother   . Cancer Daughter 44       stage III-B breast CA  . Breast cancer Daughter   . Colon polyps Brother   . Allergies Daughter   . Esophageal cancer Neg Hx   . Rectal cancer Neg Hx   . Stomach cancer Neg Hx      Social history: Lives in a home with carpeting with gas heating and central cooling.  1 dog in the home.  There is no concern for water damage, mildew or roaches in the home.  She does not report an occupation at this time.  She denies a smoking history.  Medication List: Current Outpatient Medications  Medication Sig Dispense Refill  . AMBULATORY NON FORMULARY MEDICATION Please provide cpap supplies: heated tubing, humidifier chamber, disposable filters, Mask/Mask frame/Head gear per patient preference 1 Units 0  . cetirizine (ZYRTEC) 10 MG tablet Take 10 mg by mouth daily.    Marland Kitchen dimenhyDRINATE (DRAMAMINE) 50 MG tablet Take 50 mg by mouth every 8 (eight) hours as needed.    . furosemide (LASIX) 20 MG tablet TAKE 1 TABLET BY MOUTH  DAILY (Patient taking differently: Take 20 mg by mouth as needed for edema. Takes it as needed when feet are swollen) 90 tablet 3  . gabapentin (NEURONTIN) 300 MG capsule TAKE 1 CAPSULE BY MOUTH  DURING THE DAY AND 2  CAPSULES BY MOUTH AT  BEDTIME 270 capsule 3  . ipratropium (ATROVENT) 0.03 % nasal spray Place 2 sprays into both nostrils 2 (two) times daily as needed for rhinitis. (Patient taking differently: Place 2 sprays into both nostrils daily.) 90 mL 3  . montelukast (SINGULAIR) 10 MG tablet Take 1 tablet (10 mg total) by mouth at bedtime. 90 tablet 3  . Multiple Vitamin (MULTIVITAMIN) tablet Take 1 tablet by mouth daily.    . pantoprazole (PROTONIX) 40 MG tablet TAKE 1 TABLET BY MOUTH  DAILY 90 tablet 3  . rosuvastatin (CRESTOR) 20 MG tablet Take 1 tablet (20 mg total) by mouth daily. 90 tablet 3  . tiZANidine (ZANAFLEX) 4 MG tablet Take 1 tablet (4 mg total) by mouth every 8 (eight) hours as needed for muscle spasms. 30 tablet 0  . triamcinolone cream (KENALOG) 0.1 % Apply 1 application topically 2 (two) times daily. 453.6 g 3  . Vitamin D, Ergocalciferol, (DRISDOL) 1.25 MG (50000 UNIT) CAPS capsule Take 1 capsule (50,000 Units total) by mouth every 7  (seven) days for 8 doses. 8 capsule 0  . rizatriptan (MAXALT) 5 MG tablet Take 1 tablet (5 mg total) by mouth as needed for migraine. May repeat in 2 hours if needed 30 tablet 3   No current facility-administered medications for this visit.    Known medication allergies: Allergies  Allergen Reactions  . Trazodone And Nefazodone Other (See Comments)    Nausea, fatique and sweating  . Codeine Itching    *Can take Hydrocodone*  .  Protriptyline Palpitations  . Tramadol Other (See Comments)    headache  . Vivactil [Protriptyline Hcl] Palpitations     Physical examination: Blood pressure 116/64, pulse 74, temperature 97.7 F (36.5 C), resp. rate 16, height 5' 1.5" (1.562 m), weight 179 lb 3.2 oz (81.3 kg), SpO2 96 %.  General: Alert, interactive, in no acute distress. HEENT: PERRLA, TMs pearly gray, inflamed turbinates moderately edematous with clear discharge, post-pharynx non erythematous. Neck: Supple without lymphadenopathy. Lungs: Clear to auscultation without wheezing, rhonchi or rales. {no increased work of breathing. CV: Normal S1, S2 without murmurs. Abdomen: Nondistended, nontender. Skin: Warm and dry, without lesions or rashes. Extremities:  No clubbing, cyanosis or edema. Neuro:   Grossly intact.  Diagnositics/Labs:  Allergy testing: Environmental allergy skin prick testing is positive to [place], mugwort, Box Elder, cedar, oak, Helminthosporium, dust mite DP. Intradermal testing is negative. Allergy testing results were read and interpreted by provider, documented by clinical staff.   Assessment and plan: Allergic rhinitis with conjunctivitis Significant postnasal drip  - Environmental allergy skin testing is positive to grass pollen, weed pollen, tree pollen, mold, dust mite -Allergen avoidance measures discussed/handouts provided -Continue singular 10 mg nightly -Try Xyzal 5 mg daily.  This is a long-acting antihistamine similar to Zyrtec, Allegra and Claritin  that may be more effective at this time.  May take twice a day if needed for additional control -use nasal Atrovent 2 sprays each nostril 3-4 times a day for max dosing.  If you still have same degree of nasal drainage let us know and would then have you try combination of atrovent and astelin -for watery eyes can use over-the-counter Pataday 1 drop each eye daily as needed -allergen immunotherapy discussed today including protocol, benefits and risk.  Informational handout provided.  If interested in this therapuetic option you can check with your insurance carrier for coverage.  Let us know if you would like to proceed with this option.    Follow-up in 3 to 4 months or sooner if needed  I appreciate the opportunity to take part in Christina Hayes's care. Please do not hesitate to contact me with questions.  Sincerely,   Prudy Feeler, MD Allergy/Immunology [facility] [place] [facility] [place] [facility]

## 2020-05-19 NOTE — Patient Instructions (Addendum)
-   Environmental allergy skin testing is positive to grass pollen, weed pollen, tree pollen, mold, dust mite -Allergen avoidance measures discussed/handouts provided -Continue singular 10 mg nightly -Try Xyzal 5 mg daily.  This is a long-acting antihistamine similar to Zyrtec, Allegra and Claritin that may be more effective at this time.  May take twice a day if needed for additional control -use nasal Atrovent 2 sprays each nostril 3-4 times a day for max dosing.  If you still have same degree of nasal drainage let us know and would then have you try combination of atrovent and astelin -for watery eyes can use over-the-counter Pataday 1 drop each eye daily as needed -allergen immunotherapy discussed today including protocol, benefits and risk.  Informational handout provided.  If interested in this therapuetic option you can check with your insurance carrier for coverage.  Let us know if you would like to proceed with this option.    Follow-up in 3 to 4 months or sooner if needed

## 2020-05-20 ENCOUNTER — Encounter (HOSPITAL_BASED_OUTPATIENT_CLINIC_OR_DEPARTMENT_OTHER): Payer: Medicare Other | Admitting: Physical Therapy

## 2020-05-24 ENCOUNTER — Encounter (HOSPITAL_BASED_OUTPATIENT_CLINIC_OR_DEPARTMENT_OTHER): Payer: Self-pay | Admitting: Physical Therapy

## 2020-05-24 ENCOUNTER — Other Ambulatory Visit: Payer: Self-pay

## 2020-05-24 ENCOUNTER — Ambulatory Visit (HOSPITAL_BASED_OUTPATIENT_CLINIC_OR_DEPARTMENT_OTHER): Payer: Medicare Other | Attending: Family Medicine | Admitting: Physical Therapy

## 2020-05-24 DIAGNOSIS — G8929 Other chronic pain: Secondary | ICD-10-CM | POA: Diagnosis not present

## 2020-05-24 DIAGNOSIS — M6281 Muscle weakness (generalized): Secondary | ICD-10-CM | POA: Diagnosis not present

## 2020-05-24 DIAGNOSIS — M545 Low back pain, unspecified: Secondary | ICD-10-CM | POA: Diagnosis not present

## 2020-05-24 DIAGNOSIS — R293 Abnormal posture: Secondary | ICD-10-CM | POA: Diagnosis not present

## 2020-05-24 DIAGNOSIS — R252 Cramp and spasm: Secondary | ICD-10-CM | POA: Insufficient documentation

## 2020-05-24 DIAGNOSIS — J3089 Other allergic rhinitis: Secondary | ICD-10-CM | POA: Diagnosis not present

## 2020-05-24 NOTE — Addendum Note (Signed)
Addended by: Theresia Lo on: 05/24/2020 08:47 AM   Modules accepted: Orders

## 2020-05-24 NOTE — Therapy (Signed)
[facility] [place], [place], [address] Phone: [phone]   Fax:  [phone]  Physical Therapy Treatment  Patient Details  Name: Christina Hayes MRN: [id number] Date of Birth: May 18, 1950 Referring Provider (PT): [name], DO   Encounter Date: 05/24/2020   PT End of Session - 05/24/20 1514    Visit Number 5    Number of Visits 13    Date for PT Re-Evaluation 06/10/20    Authorization Type UHC MCR    Progress Note Due on Visit 10    PT Start Time 1514    PT Stop Time 1554    PT Time Calculation (min) 40 min    Activity Tolerance Patient tolerated treatment well    Behavior During Therapy [facility] for tasks assessed/performed           Past Medical History:  Diagnosis Date  . Acid reflux   . Allergic rhinitis, cause unspecified   . Allergy   . Anemia    years ago   . Cataract   . Complication of anesthesia SLOW TO WAKE  . Cough   . DJD (degenerative joint disease)    spine  . Frequency of urination   . H/O hiatal hernia    small  . Head cold   . Headache(784.0)    migraqine- uses essential oils  . HYPERLIPIDEMIA, MIXED 12/03/2006   PT STATES NOT TAKING MED AS PRESCRIBED  . MIGRAINES, HX OF 12/03/2006  . Neuromuscular disorder ([place])   . Nocturia   . OSA on CPAP   . Osteopenia   . PONV (postoperative nausea and vomiting)    in past  . Shortness of breath   . Sleep apnea    cpap  . SUI (stress urinary incontinence, female)   . Urgency of urination   . VERTIGO, HX OF 12/03/2006    Past Surgical History:  Procedure Laterality Date  . COLONOSCOPY    . PUBOVAGINAL SLING  04/30/2011   Procedure: Gaynelle Arabian;  Surgeon: Bernestine Amass, MD;  Location: [facility];  Service: Urology;  Laterality: N/A;  sub urethral sling    possible ower  . RIGID BRONCHOSCOPY N/A 04/23/2013   Procedure: LASER BRONCHOSCOPY;  Surgeon: Melrose Nakayama, MD;  Location: [place];  Service:  Thoracic;  Laterality: N/A;  . [place]  . VIDEO BRONCHOSCOPY Bilateral 03/26/2013   Procedure: VIDEO BRONCHOSCOPY WITHOUT FLUORO;  Surgeon: Tanda Rockers, MD;  Location: [facility];  Service: Cardiopulmonary;  Laterality: Bilateral;  . VIDEO BRONCHOSCOPY N/A 04/23/2013   Procedure: VIDEO BRONCHOSCOPY;  Surgeon: Melrose Nakayama, MD;  Location: [place];  Service: Thoracic;  Laterality: N/A;  . VIDEO BRONCHOSCOPY N/A 05/28/2014   Procedure: VIDEO BRONCHOSCOPY;  Surgeon: Melrose Nakayama, MD;  Location: [place];  Service: Thoracic;  Laterality: N/A;    There were no vitals filed for this visit.   Subjective Assessment - 05/24/20 1515    Subjective Walked an hour. A few small incidences of dizziness- a couple of times I moved my head at just the right angle. Ive had a litte back and hip pain since last visit.    How long can you walk comfortably? Sometimes feels some pain but not always    Patient Stated Goals walking    Currently in Pain? No/denies  [place] Adult PT Treatment/Exercise - 05/24/20 0001      Knee/Hip Exercises: Stretches   Passive Hamstring Stretch Both;30 seconds    Passive Hamstring Stretch Limitations seated    Piriformis Stretch Limitations seated in chair      Knee/Hip Exercises: Standing   Hip Abduction Both;2 sets;20 reps;Knee straight    Abduction Limitations elbows on bar in second set    Other Standing Knee Exercises plank with knee pulls on bar      Knee/Hip Exercises: Seated   Other Seated Knee/Hip Exercises seated roundback- iso holds & UE diagonals      Manual Therapy   Soft tissue mobilization Rt piriformis, glut med/min                    PT Short Term Goals - 05/17/20 1524      PT SHORT TERM GOAL #1   Title pt will demo good core control in exercises      PT SHORT TERM GOAL #2   Title pt will demo proper hip hinge in seated and  standing    Baseline able to demonstrate but does not utilize in ADL's             PT Long Term Goals - 04/26/20 1654      PT LONG TERM GOAL #1   Title pt will demo 5/5 gross hip strength    Baseline see flowsheet    Time 6    Period Weeks    Status New    Target Date 06/10/20      PT LONG TERM GOAL #2   Title pt will demo proper bending and lifting technique to avoid future injury    Baseline will progress as appropriate    Time 6    Period Weeks    Status New    Target Date 06/10/20      PT LONG TERM GOAL #3   Title pt will have an established walking program with understanding of progressions over time    Baseline will establish as appropriate    Time 6    Period Weeks    Status New    Target Date 06/10/20      PT LONG TERM GOAL #4   Title pt will verbalize feeling an improvement in her balance control in ADLs    Baseline reports feeling that her balance has never really been good    Time 6    Period Weeks    Status New    Target Date 06/10/20                 Plan - 05/24/20 1554    Clinical Impression Statement Was able to lay in sidelying on 2 pillows for manual therapy to Rt hip-significant pain found in trigger point of piriformis. Added core strength exercises and moved hip abd to standing to decrease dizziness today.    PT Treatment/Interventions ADLs/Self Care Home Management;Cryotherapy;Electrical Stimulation;Iontophoresis 4mg /ml Dexamethasone;Moist Heat;Therapeutic activities;Functional mobility training;Therapeutic exercise;Neuromuscular re-education;Patient/family education;Balance training;Dry needling;Passive range of motion;Manual techniques;Taping;Vestibular;Aquatic Therapy    PT Next Visit Plan review HEP, cont core strength    PT Home Exercise Plan [id number]    Consulted and Agree with Plan of Care Patient           Patient will benefit from skilled therapeutic intervention in order to improve the following deficits and impairments:   Increased muscle spasms,Improper body mechanics,Decreased activity tolerance,Decreased strength,Postural dysfunction,Pain  Visit Diagnosis: Chronic bilateral low back pain without  sciatica  Abnormal posture  Muscle weakness (generalized)  Cramp and spasm     Problem List Patient Active Problem List   Diagnosis Date Noted  . Osteoporosis 04/18/2020  . Chronic rhinitis 12/28/2019  . Osteoarthritis 10/18/2019  . Weight gain 04/13/2019  . GERD (gastroesophageal reflux disease) 04/13/2019  . Insomnia 04/13/2019  . DJD (degenerative joint disease) of knee 09/06/2015  . Degenerative tear of medial meniscus 09/06/2015  . Severe sleep apnea 07/21/2015  . Osteopenia determined by x-ray 07/20/2015  . Lumbago 06/15/2015  . Left knee pain 06/10/2015  . Carcinoid tumor 05/18/2014  . Sleep pattern disturbance 01/26/2014  . Obesity, unspecified 03/25/2012  . HLD (hyperlipidemia) 12/03/2006  . VERTIGO, HX OF 12/03/2006  . Migraine 12/03/2006    Jaylan Hinojosa C. Beaux Wedemeyer PT, DPT 05/24/20 3:56 PM   [facility] [place], [place], [address] Phone: [phone]   Fax:  [phone]  Name: Christina Hayes MRN: [id number] Date of Birth: 13-Jul-1950

## 2020-05-24 NOTE — Progress Notes (Signed)
Aeroallergen Immunotherapy   Ordering Provider: Dr. Prudy Feeler   Patient Details  Name: Christina Hayes  MRN: [id number]  Date of Birth: 04-Aug-1950   Order 1 of 2   Vial Label: Pollen   0.3 ml (Volume) BAU Concentration -- [place] 10,000  0.2 ml (Volume) 1:20 Concentration -- Mugwort, Common*  0.2 ml (Volume) 1:20 Concentration -- Box Elder  0.2 ml (Volume) 1:10 Concentration -- Cedar, red  0.3 ml (Volume) 1:10 Concentration -- Oak, [place] mix*    1.2 ml Extract Subtotal  3.8 ml Diluent  5.0 ml Maintenance Total   Schedule: B  Silver Vial (1:1,000,000): Schedule B (6 doses)  Blue Vial (1:100,000): Schedule B (6 doses)  Yellow Vial (1:10,000): Schedule B (6 doses)  Green Vial (1:1,000): Schedule B (6 doses)  Red Vial (1:100): Schedule B (6 doses)   Special Instructions: 1 injection/week

## 2020-05-24 NOTE — Progress Notes (Signed)
Aeroallergen Immunotherapy   Ordering Provider: Dr. Prudy Feeler   Patient Details  Name: Christina Hayes  MRN: [id number]  Date of Birth: 10/24/1950   Order 1 of 2   Vial Label: Mite, mold   0.2 ml (Volume) 1:20 Concentration -- Bipolaris sorokiniana  0.5 ml (Volume)  AU Concentration -- Mite Mix (DF 5,000 & DP 5,000)    0.7 ml Extract Subtotal  4.3 ml Diluent  5.0 ml Maintenance Total   Schedule: B  Silver Vial (1:1,000,000): Schedule B (6 doses)  Blue Vial (1:100,000): Schedule B (6 doses)  Yellow Vial (1:10,000): Schedule B (6 doses)  Green Vial (1:1,000): Schedule B (6 doses)  Red Vial (1:100): Schedule B (6 doses)   Special Instructions: 1 injection/week

## 2020-05-24 NOTE — Progress Notes (Signed)
VIALS EXP 05-24-21 

## 2020-05-25 DIAGNOSIS — J301 Allergic rhinitis due to pollen: Secondary | ICD-10-CM | POA: Diagnosis not present

## 2020-05-26 ENCOUNTER — Ambulatory Visit
Admission: RE | Admit: 2020-05-26 | Discharge: 2020-05-26 | Disposition: A | Discharge status: Home / Self Care | Payer: Medicare Other | Source: Ambulatory Visit | Attending: Family Medicine | Admitting: Family Medicine

## 2020-05-26 ENCOUNTER — Other Ambulatory Visit: Payer: Self-pay

## 2020-05-26 DIAGNOSIS — N6489 Other specified disorders of breast: Secondary | ICD-10-CM | POA: Diagnosis not present

## 2020-05-26 DIAGNOSIS — R928 Other abnormal and inconclusive findings on diagnostic imaging of breast: Secondary | ICD-10-CM

## 2020-05-27 ENCOUNTER — Ambulatory Visit (HOSPITAL_BASED_OUTPATIENT_CLINIC_OR_DEPARTMENT_OTHER): Payer: Medicare Other | Admitting: Physical Therapy

## 2020-05-31 ENCOUNTER — Encounter (HOSPITAL_BASED_OUTPATIENT_CLINIC_OR_DEPARTMENT_OTHER): Payer: Self-pay | Admitting: Physical Therapy

## 2020-05-31 ENCOUNTER — Ambulatory Visit (HOSPITAL_BASED_OUTPATIENT_CLINIC_OR_DEPARTMENT_OTHER): Payer: Medicare Other | Admitting: Physical Therapy

## 2020-05-31 ENCOUNTER — Other Ambulatory Visit: Payer: Self-pay

## 2020-05-31 DIAGNOSIS — R293 Abnormal posture: Secondary | ICD-10-CM | POA: Diagnosis not present

## 2020-05-31 DIAGNOSIS — M545 Low back pain, unspecified: Secondary | ICD-10-CM

## 2020-05-31 DIAGNOSIS — R252 Cramp and spasm: Secondary | ICD-10-CM | POA: Diagnosis not present

## 2020-05-31 DIAGNOSIS — G8929 Other chronic pain: Secondary | ICD-10-CM | POA: Diagnosis not present

## 2020-05-31 DIAGNOSIS — M6281 Muscle weakness (generalized): Secondary | ICD-10-CM | POA: Diagnosis not present

## 2020-05-31 NOTE — Therapy (Signed)
[place], [place], [address] Phone: [phone]   Fax:  [phone]  Physical Therapy Treatment/Discharge  Patient Details  Name: Christina Hayes MRN: [id number] Date of Birth: [birth year] Referring Provider (PT): [name], DO   Encounter Date: 05/31/2020   PT End of Session - 05/31/20 1512    Visit Number 6    Number of Visits 13    Date for PT Re-Evaluation 06/10/20    Authorization Type UHC MCR    Progress Note Due on Visit 10    PT Start Time 1513    PT Stop Time 1532    PT Time Calculation (min) 19 min    Activity Tolerance Patient tolerated treatment well    Behavior During Therapy [facility] for tasks assessed/performed           Past Medical History:  Diagnosis Date  . Acid reflux   . Allergic rhinitis, cause unspecified   . Allergy   . Anemia    years ago   . Cataract   . Complication of anesthesia SLOW TO WAKE  . Cough   . DJD (degenerative joint disease)    spine  . Frequency of urination   . H/O hiatal hernia    small  . Head cold   . Headache(784.0)    migraqine- uses essential oils  . HYPERLIPIDEMIA, MIXED 12/03/2006   PT STATES NOT TAKING MED AS PRESCRIBED  . MIGRAINES, HX OF 12/03/2006  . Neuromuscular disorder ([place])   . Nocturia   . OSA on CPAP   . Osteopenia   . PONV (postoperative nausea and vomiting)    in past  . Shortness of breath   . Sleep apnea    cpap  . SUI (stress urinary incontinence, female)   . Urgency of urination   . VERTIGO, HX OF 12/03/2006    Past Surgical History:  Procedure Laterality Date  . COLONOSCOPY    . PUBOVAGINAL SLING  04/30/2011   Procedure: Gaynelle Arabian;  Surgeon: Bernestine Amass, MD;  Location: [facility];  Service: Urology;  Laterality: N/A;  sub urethral sling    possible ower  . RIGID BRONCHOSCOPY N/A 04/23/2013   Procedure: LASER BRONCHOSCOPY;  Surgeon: Melrose Nakayama, MD;  Location: [place];   Service: Thoracic;  Laterality: N/A;  . [place]  . VIDEO BRONCHOSCOPY Bilateral 03/26/2013   Procedure: VIDEO BRONCHOSCOPY WITHOUT FLUORO;  Surgeon: Tanda Rockers, MD;  Location: [facility];  Service: Cardiopulmonary;  Laterality: Bilateral;  . VIDEO BRONCHOSCOPY N/A 04/23/2013   Procedure: VIDEO BRONCHOSCOPY;  Surgeon: Melrose Nakayama, MD;  Location: [place];  Service: Thoracic;  Laterality: N/A;  . VIDEO BRONCHOSCOPY N/A 05/28/2014   Procedure: VIDEO BRONCHOSCOPY;  Surgeon: Melrose Nakayama, MD;  Location: [place];  Service: Thoracic;  Laterality: N/A;    There were no vitals filed for this visit.   Subjective Assessment - 05/31/20 1514    Subjective I am in the phase where the dizziness sticks around a little and then in a few days it will go away- sees things moving across her vision.    Patient Stated Goals walking    Currently in Pain? No/denies              [facility] PT Assessment - 05/31/20 0001      Strength   Right Hip Flexion 5/5    Right Hip ABduction 5/5  Left Hip Flexion 5/5    Left Hip ABduction 5/5                                 PT Education - 05/31/20 1921    Education Details importance of continued HEP & long term POC    Person(s) Educated Patient    Methods Explanation    Comprehension Verbalized understanding            PT Short Term Goals - 05/17/20 1524      PT SHORT TERM GOAL #1   Title pt will demo good core control in exercises      PT SHORT TERM GOAL #2   Title pt will demo proper hip hinge in seated and standing    Baseline able to demonstrate but does not utilize in ADL's             PT Long Term Goals - 05/31/20 1519      PT LONG TERM GOAL #1   Title pt will demo 5/5 gross hip strength    Status Achieved      PT LONG TERM GOAL #2   Title pt will demo proper bending and lifting technique to avoid future injury    Status Achieved      PT  LONG TERM GOAL #3   Title pt will have an established walking program with understanding of progressions over time    Status Achieved      PT LONG TERM GOAL #4   Title pt will verbalize feeling an improvement in her balance control in ADLs    Status Achieved                 Plan - 05/31/20 1922    Clinical Impression Statement Pt has met all of her goals at this time and is prepared for d/c to independent program. will continue to work on strength and increasing endurance tolerance to walking. encouraged her to contact me with any further questions and to feel free to return in the future for progressed challenges as she feels able.    PT Treatment/Interventions ADLs/Self Care Home Management;Cryotherapy;Electrical Stimulation;Iontophoresis 74m/ml Dexamethasone;Moist Heat;Therapeutic activities;Functional mobility training;Therapeutic exercise;Neuromuscular re-education;Patient/family education;Balance training;Dry needling;Passive range of motion;Manual techniques;Taping;Vestibular;Aquatic Therapy    PT Home Exercise Plan [id number]   Consulted and Agree with Plan of Care Patient           Patient will benefit from skilled therapeutic intervention in order to improve the following deficits and impairments:  Increased muscle spasms,Improper body mechanics,Decreased activity tolerance,Decreased strength,Postural dysfunction,Pain  Visit Diagnosis: Chronic bilateral low back pain without sciatica  Abnormal posture  Muscle weakness (generalized)  Cramp and spasm     Problem List Patient Active Problem List   Diagnosis Date Noted  . Osteoporosis 04/18/2020  . Chronic rhinitis 12/28/2019  . Osteoarthritis 10/18/2019  . Weight gain 04/13/2019  . GERD (gastroesophageal reflux disease) 04/13/2019  . Insomnia 04/13/2019  . DJD (degenerative joint disease) of knee 09/06/2015  . Degenerative tear of medial meniscus 09/06/2015  . Severe sleep apnea 07/21/2015  . Osteopenia  determined by x-ray 07/20/2015  . Lumbago 06/15/2015  . Left knee pain 06/10/2015  . Carcinoid tumor 05/18/2014  . Sleep pattern disturbance 01/26/2014  . Obesity, unspecified 03/25/2012  . HLD (hyperlipidemia) 12/03/2006  . VERTIGO, HX OF 12/03/2006  . Migraine 12/03/2006   PHYSICAL THERAPY DISCHARGE SUMMARY  Visits from Start of Care:  6  Current functional level related to goals / functional outcomes: See above   Remaining deficits: See above   Education / Equipment: Anatomy of condition, POC, HEP, exercise form/rationale  Plan: Patient agrees to discharge.  Patient goals were met. Patient is being discharged due to meeting the stated rehab goals.  ?????     Jahyra Sukup C. Howell Groesbeck PT, DPT 05/31/20 7:24 PM   [place], [place], [address] Phone: [phone]   Fax:  [phone]  Name: Christina Hayes MRN: [id number] Date of Birth: 11-06-1950

## 2020-06-03 ENCOUNTER — Ambulatory Visit (HOSPITAL_BASED_OUTPATIENT_CLINIC_OR_DEPARTMENT_OTHER): Payer: Medicare Other | Admitting: Physical Therapy

## 2020-06-07 ENCOUNTER — Other Ambulatory Visit: Payer: Self-pay | Admitting: Thoracic Surgery (Cardiothoracic Vascular Surgery)

## 2020-06-07 ENCOUNTER — Ambulatory Visit (HOSPITAL_BASED_OUTPATIENT_CLINIC_OR_DEPARTMENT_OTHER): Payer: Medicare Other | Admitting: Physical Therapy

## 2020-06-07 DIAGNOSIS — C7A090 Malignant carcinoid tumor of the bronchus and lung: Secondary | ICD-10-CM

## 2020-06-08 ENCOUNTER — Other Ambulatory Visit: Payer: Self-pay | Admitting: *Deleted

## 2020-06-08 MED ORDER — LEVOCETIRIZINE DIHYDROCHLORIDE 5 MG PO TABS: 5.0000 mg | ORAL_TABLET | Freq: Every evening | ORAL | 1 refills | Status: DC

## 2020-06-09 ENCOUNTER — Ambulatory Visit (INDEPENDENT_AMBULATORY_CARE_PROVIDER_SITE_OTHER): Payer: Medicare Other | Admitting: Allergy

## 2020-06-09 ENCOUNTER — Other Ambulatory Visit: Payer: Self-pay

## 2020-06-09 DIAGNOSIS — J309 Allergic rhinitis, unspecified: Secondary | ICD-10-CM

## 2020-06-09 MED ORDER — EPINEPHRINE 0.3 MG/0.3ML IJ SOAJ: 0.3000 mg | Freq: Once | INTRAMUSCULAR | 2 refills | Status: AC

## 2020-06-10 ENCOUNTER — Encounter: Payer: Self-pay | Admitting: Thoracic Surgery (Cardiothoracic Vascular Surgery)

## 2020-06-10 ENCOUNTER — Encounter: Payer: Self-pay | Admitting: Family Medicine

## 2020-06-10 ENCOUNTER — Encounter (HOSPITAL_BASED_OUTPATIENT_CLINIC_OR_DEPARTMENT_OTHER): Payer: Medicare Other | Admitting: Physical Therapy

## 2020-06-10 ENCOUNTER — Telehealth: Payer: Self-pay | Admitting: Family Medicine

## 2020-06-10 NOTE — Chronic Care Management (AMB) (Signed)
  Chronic Care Management   Note  06/10/2020 Name: LATREASE KUNDE MRN: [id number] DOB: 1950/06/29  ILA LANDOWSKI is a 70 y.o. year old female who is a primary care patient of [name], DO. I reached out to [name] by phone today in response to a referral sent by Ms. Aris Everts Pena's PCP, [name], DO.   Ms. Tang was given information about Chronic Care Management services today including:  1. CCM service includes personalized support from designated clinical staff supervised by her physician, including individualized plan of care and coordination with other care providers 2. 24/7 contact phone numbers for assistance for urgent and routine care needs. 3. Service will only be billed when office clinical staff spend 20 minutes or more in a month to coordinate care. 4. Only one practitioner may furnish and bill the service in a calendar month. 5. The patient may stop CCM services at any time (effective at the end of the month) by phone call to the office staff.   Patient agreed to services and verbal consent obtained.   Follow up plan:   Lauretta Grill Upstream Scheduler

## 2020-06-10 NOTE — Progress Notes (Signed)
  Chronic Care Management   Outreach Note  06/10/2020 Name: Christina Hayes MRN: [id number] DOB: [birth year]  Referred by: [name], DO Reason for referral : No chief complaint on file.   An unsuccessful telephone outreach was attempted today. The patient was referred to the pharmacist for assistance with care management and care coordination.   Follow Up Plan:   Lauretta Grill Upstream Scheduler

## 2020-06-16 ENCOUNTER — Ambulatory Visit (INDEPENDENT_AMBULATORY_CARE_PROVIDER_SITE_OTHER): Payer: Medicare Other | Admitting: *Deleted

## 2020-06-16 DIAGNOSIS — J309 Allergic rhinitis, unspecified: Secondary | ICD-10-CM | POA: Diagnosis not present

## 2020-06-23 ENCOUNTER — Ambulatory Visit (INDEPENDENT_AMBULATORY_CARE_PROVIDER_SITE_OTHER): Payer: Medicare Other | Admitting: *Deleted

## 2020-06-23 DIAGNOSIS — J309 Allergic rhinitis, unspecified: Secondary | ICD-10-CM | POA: Diagnosis not present

## 2020-06-29 ENCOUNTER — Ambulatory Visit (INDEPENDENT_AMBULATORY_CARE_PROVIDER_SITE_OTHER): Payer: Medicare Other

## 2020-06-29 DIAGNOSIS — J309 Allergic rhinitis, unspecified: Secondary | ICD-10-CM | POA: Diagnosis not present

## 2020-07-07 ENCOUNTER — Ambulatory Visit (INDEPENDENT_AMBULATORY_CARE_PROVIDER_SITE_OTHER): Payer: Medicare Other | Admitting: *Deleted

## 2020-07-07 DIAGNOSIS — J309 Allergic rhinitis, unspecified: Secondary | ICD-10-CM | POA: Diagnosis not present

## 2020-07-11 ENCOUNTER — Ambulatory Visit (INDEPENDENT_AMBULATORY_CARE_PROVIDER_SITE_OTHER): Payer: Medicare Other | Admitting: Pharmacist

## 2020-07-11 DIAGNOSIS — F5101 Primary insomnia: Secondary | ICD-10-CM

## 2020-07-11 DIAGNOSIS — E782 Mixed hyperlipidemia: Secondary | ICD-10-CM | POA: Diagnosis not present

## 2020-07-11 DIAGNOSIS — G8929 Other chronic pain: Secondary | ICD-10-CM

## 2020-07-11 NOTE — Patient Instructions (Signed)
Visit Information   PATIENT GOALS:   Goals Addressed             This Visit's Progress    Medication Management       Patient Goals/Self-Care Activities Over the next 30 days, patient will:  take medications as prescribed  Follow Up Plan: Telephone follow up appointment with care management team member scheduled for:  1 month          Consent to CCM Services: Ms. Seufert was given information about Chronic Care Management services today including:  CCM service includes personalized support from designated clinical staff supervised by her physician, including individualized plan of care and coordination with other care providers 24/7 contact phone numbers for assistance for urgent and routine care needs. Service will only be billed when office clinical staff spend 20 minutes or more in a month to coordinate care. Only one practitioner may furnish and bill the service in a calendar month. The patient may stop CCM services at any time (effective at the end of the month) by phone call to the office staff. The patient will be responsible for cost sharing (co-pay) of up to 20% of the service fee (after annual deductible is met).  Patient agreed to services and verbal consent obtained.   Patient verbalizes understanding of instructions provided today and agrees to view in [place].   Telephone follow up appointment with care management team member scheduled for: 1 month  CLINICAL CARE PLAN: Patient Care Plan: Medication Management     Problem Identified: HLD, chronic pain, insomnia      Long-Range Goal: Disease Progression Prevention   Start Date: 07/11/2020  This Visit's Progress: On track  Priority: High  Note:   Current Barriers:  Suboptimal therapeutic regimen for insomnia management  Pharmacist Clinical Goal(s):  Over the next 30 days, patient will adhere to plan to optimize therapeutic regimen for insomnia as evidenced by report of adherence to recommended medication  management changes through collaboration with PharmD and provider.   Interventions: 1:1 collaboration with [name], DO regarding development and update of comprehensive plan of care as evidenced by provider attestation and co-signature Inter-disciplinary care team collaboration (see longitudinal plan of care) Comprehensive medication review performed; medication list updated in electronic medical record  Hyperlipidemia:  Controlled; current treatment:rosuvastatin 59m daily;   Recommended continue current regimen  Chronic Pain (Ostoearthritis)  Controlled; current treatment: PRN gabapentin, PRN meloxicam, PRN tizanidine, physical therapy;   Recommend: continue current regimen  Insomnia  Uncontrolled; current treatment: gabapentin qhs  Recommend: trial of melatonin 5-10mg OTC. Can try 526m if non-effective x1 week, increase to 1061mTouch base in 1 month to determine effectiveness/next steps.  Patient Goals/Self-Care Activities Over the next 30 days, patient will:  take medications as prescribed  Follow Up Plan: Telephone follow up appointment with care management team member scheduled for:  1 month

## 2020-07-11 NOTE — Progress Notes (Signed)
Chronic Care Management Pharmacy Note  07/11/2020 Name:  Christina Hayes MRN:  [id number] DOB:  09/03/1950  Summary: addressed HLD, chronic pain, insomnia. Pt appreciates that gabapentin is safer than ambien use, but feels gabapentin is not effective for sleep aid.  Recommendations/Changes made from today's visit: trial of melatonin 67m x1 week, if not effective may increase to melatonin 162m If still not effective, may consider hydroxyzine PRN.  Plan: f/u with pharmacist in 1 month  Subjective: Christina DEGRACIAs an [age].o. year old female who is a primary patient of MaLuetta NuttingDO.  The CCM team was consulted for assistance with disease management and care coordination needs.    Engaged with patient by telephone for initial visit in response to provider referral for pharmacy case management and/or care coordination services.   Consent to Services:  The patient was given information about Chronic Care Management services, agreed to services, and gave verbal consent prior to initiation of services.  Please see initial visit note for detailed documentation.   Patient Care Team: MaLuetta NuttingDO as PCP - General (Family Medicine)   Objective:  Lab Results  Component Value Date   CREATININE 0.75 04/26/2020   CREATININE 0.82 10/14/2019   CREATININE 0.95 04/30/2019    Lab Results  Component Value Date   HGBA1C 5.8 08/09/2011   Last diabetic Eye exam: No results found for: HMDIABEYEEXA  Last diabetic Foot exam: No results found for: HMDIABFOOTEX      Component Value Date/Time   CHOL 152 10/14/2019 1202   CHOL 286 (H) 04/30/2019 0946   TRIG 115 10/14/2019 1202   HDL 68 10/14/2019 1202   HDL 63 04/30/2019 0946   CHOLHDL 2.2 10/14/2019 1202   VLDL 17 07/13/2015 1113   LDLCALC 64 10/14/2019 1202   LDLDIRECT 231.2 03/28/2010 0948    Hepatic Function Latest Ref Rng & Units 04/26/2020 10/14/2019 04/30/2019  Total Protein 6.5 - 8.1 g/dL 7.5 7.3 6.9  Albumin 3.5 - 5.0 g/dL 4.3 -  4.3  AST 15 - 41 U/L 24 27 59(H)  ALT 0 - 44 U/L 18 20 54(H)  Alk Phosphatase 38 - 126 U/L 94 - 181(H)  Total Bilirubin 0.3 - 1.2 mg/dL 0.4 0.6 0.3  Bilirubin, Direct 0.0 - 0.3 mg/dL - - -    Lab Results  Component Value Date/Time   TSH 2.770 04/30/2019 09:46 AM   TSH 1.71 03/12/2016 02:34 PM    CBC Latest Ref Rng & Units 04/30/2019 10/16/2017 06/11/2016  WBC 3.4 - 10.8 x10E3/uL 6.3 6.7 8.3  Hemoglobin 11.1 - 15.9 g/dL 12.2 12.6 12.7  Hematocrit 34.0 - 46.6 % 37.1 37.1 38.2  Platelets 150 - 450 x10E3/uL 372 376 442(H)    Lab Results  Component Value Date/Time   VD25OH 24.70 (L) 04/26/2020 04:30 PM    Social History   Tobacco Use  Smoking Status Never  Smokeless Tobacco Never   BP Readings from Last 3 Encounters:  05/19/20 116/64  04/12/20 127/66  03/23/20 114/63   Pulse Readings from Last 3 Encounters:  05/19/20 74  04/12/20 65  03/23/20 (!) 51   Wt Readings from Last 3 Encounters:  05/19/20 179 lb 3.2 oz (81.3 kg)  04/18/20 174 lb (78.9 kg)  04/12/20 177 lb 4.8 oz (80.4 kg)    Assessment: Review of patient past medical history, allergies, medications, health status, including review of consultants reports, laboratory and other test data, was performed as part of comprehensive evaluation and provision of chronic care  management services.   SDOH:  (Social Determinants of Health) assessments and interventions performed:    CCM Care Plan  Allergies  Allergen Reactions   Trazodone And Nefazodone Other (See Comments)    Nausea, fatique and sweating   Codeine Itching    *Can take Hydrocodone*   Protriptyline Palpitations   Tramadol Other (See Comments)    headache   Vivactil [Protriptyline Hcl] Palpitations    Medications Reviewed Today     Reviewed by Selinda Eon, PT (Physical Therapist) on 05/31/20 at 1511  Med List Status: <None>   Medication Order Taking? Sig Documenting Provider Last Dose Status Informant  AMBULATORY NON FORMULARY MEDICATION  [id number] No Please provide cpap supplies: heated tubing, humidifier chamber, disposable filters, Mask/Mask frame/Head gear per patient preference Christina Nutting, DO Taking Active   cetirizine (ZYRTEC) 10 MG tablet [id number] No Take 10 mg by mouth daily. [provider] Taking Active   dimenhyDRINATE (DRAMAMINE) 50 MG tablet [id number] No Take 50 mg by mouth every 8 (eight) hours as needed. [provider] Taking Active   furosemide (LASIX) 20 MG tablet [id number] No TAKE 1 TABLET BY MOUTH  DAILY  Patient taking differently: Take 20 mg by mouth as needed for edema. Takes it as needed when feet are swollen   Christina Nutting, DO Taking Active   gabapentin (NEURONTIN) 300 MG capsule [id number] No TAKE 1 CAPSULE BY MOUTH  DURING THE DAY AND 2  CAPSULES BY MOUTH AT  BEDTIME Silverio Decamp, MD Taking Active   ipratropium (ATROVENT) 0.03 % nasal spray [id number] No Place 2 sprays into both nostrils 2 (two) times daily as needed for rhinitis.  Patient taking differently: Place 2 sprays into both nostrils daily.   Christina Nutting, DO Taking Active   montelukast (SINGULAIR) 10 MG tablet [id number] No Take 1 tablet (10 mg total) by mouth at bedtime. Christina Nutting, DO Taking Active   Multiple Vitamin (MULTIVITAMIN) tablet [id number] No Take 1 tablet by mouth daily. [provider] Taking Active   pantoprazole (PROTONIX) 40 MG tablet [id number] No TAKE 1 TABLET BY MOUTH  DAILY Christina Nutting, DO Taking Active   rizatriptan (MAXALT) 5 MG tablet [id number] No Take 1 tablet (5 mg total) by mouth as needed for migraine. May repeat in 2 hours if needed Christina Nutting, DO Taking Expired 01/27/20 2359   rosuvastatin (CRESTOR) 20 MG tablet [id number] No Take 1 tablet (20 mg total) by mouth daily. Christina Nutting, DO Taking Active   tiZANidine (ZANAFLEX) 4 MG tablet [id number] No Take 1 tablet (4 mg total) by mouth every 8 (eight) hours as needed for muscle spasms. Christina Nutting, DO Taking Active    triamcinolone cream (KENALOG) 0.1 % [id number] No Apply 1 application topically 2 (two) times daily. Christina Nutting, DO Taking Active   Vitamin D, Ergocalciferol, (DRISDOL) 1.25 MG (50000 UNIT) CAPS capsule [id number] No Take 1 capsule (50,000 Units total) by mouth every 7 (seven) days for 8 doses. Christina Nutting, DO Taking Active             Patient Active Problem List   Diagnosis Date Noted   Osteoporosis 04/18/2020   Chronic rhinitis 12/28/2019   Osteoarthritis 10/18/2019   Weight gain 04/13/2019   GERD (gastroesophageal reflux disease) 04/13/2019   Insomnia 04/13/2019   DJD (degenerative joint disease) of knee 09/06/2015   Degenerative tear of medial meniscus 09/06/2015   Severe sleep apnea 07/21/2015   Osteopenia determined by x-ray 07/20/2015   Lumbago 06/15/2015   Left knee pain  06/10/2015   Carcinoid tumor 05/18/2014   Sleep pattern disturbance 01/26/2014   Obesity, unspecified 03/25/2012   HLD (hyperlipidemia) 12/03/2006   VERTIGO, HX OF 12/03/2006   Migraine 12/03/2006    Immunization History  Administered Date(s) Administered   Fluad Quad(high Dose 65+) 10/14/2019   Influenza, High Dose Seasonal PF 10/23/2016, 09/30/2018   Influenza,inj,Quad PF,6+ Mos 11/10/2015, 10/14/2017   Influenza-Unspecified 10/23/2016   PFIZER(Purple Top)SARS-COV-2 Vaccination 04/01/2019, 04/22/2019, 12/04/2019, 06/10/2020   Pneumococcal Conjugate-13 07/13/2015   Pneumococcal Polysaccharide-23 03/25/2012, 05/16/2017   Td 05/20/2003   Tdap 01/26/2014   Zoster, Live 04/25/2010    Conditions to be addressed/monitored: HLD and chronic pain, insomnia  There are no care plans that you recently modified to display for this patient.   Medication Assistance: None required.  Patient affirms current coverage meets needs.  Patient's preferred pharmacy is:  [id number] [place], [place] - [address] [address]Phone: [phone]: [phone] [facility]  [address] [place]Phone: [phone]: [phone] Uses pill box? No - has a system in her drawer/container for AM & PM, works for her Pt endorses 80% compliance  Follow Up:  Patient agrees to Care Plan and Follow-up.  Plan: Telephone follow up appointment with care management team member scheduled for:  1 month  KDarius Bump

## 2020-07-14 ENCOUNTER — Ambulatory Visit (INDEPENDENT_AMBULATORY_CARE_PROVIDER_SITE_OTHER): Payer: Medicare Other | Admitting: *Deleted

## 2020-07-14 ENCOUNTER — Ambulatory Visit
Admission: RE | Admit: 2020-07-14 | Discharge: 2020-07-14 | Disposition: A | Discharge status: Home / Self Care | Payer: Medicare Other | Source: Ambulatory Visit | Attending: Thoracic Surgery (Cardiothoracic Vascular Surgery) | Admitting: Thoracic Surgery (Cardiothoracic Vascular Surgery)

## 2020-07-14 DIAGNOSIS — J309 Allergic rhinitis, unspecified: Secondary | ICD-10-CM | POA: Diagnosis not present

## 2020-07-14 DIAGNOSIS — C7A090 Malignant carcinoid tumor of the bronchus and lung: Secondary | ICD-10-CM

## 2020-07-14 DIAGNOSIS — R918 Other nonspecific abnormal finding of lung field: Secondary | ICD-10-CM | POA: Diagnosis not present

## 2020-07-19 ENCOUNTER — Other Ambulatory Visit: Payer: Self-pay

## 2020-07-19 ENCOUNTER — Ambulatory Visit (INDEPENDENT_AMBULATORY_CARE_PROVIDER_SITE_OTHER): Payer: Medicare Other | Admitting: Thoracic Surgery (Cardiothoracic Vascular Surgery)

## 2020-07-19 ENCOUNTER — Other Ambulatory Visit: Payer: Medicare Other

## 2020-07-19 VITALS — BP 123/75 | HR 74 | Resp 20 | Ht 62.0 in | Wt 170.0 lb

## 2020-07-19 DIAGNOSIS — C7A090 Malignant carcinoid tumor of the bronchus and lung: Secondary | ICD-10-CM | POA: Diagnosis not present

## 2020-07-19 NOTE — Progress Notes (Signed)
[address]       [place] [address]             [phone]     HPI: Christina Hayes returns for a scheduled annual follow-up visit regarding a carcinoid tumor.  Christina Hayes is a 70 year old woman with a history of hyperlipidemia, obstructive sleep apnea, migraines, hiatal hernia, reflux, and carcinoid tumor of the bronchus.  She had about 5 years of respiratory symptoms prior to being diagnosed with an endobronchial tumor in the left mainstem in 2015.  I did an endobronchial resection and laser ablation in April 2015.  It turned out to be a low-grade neuroendocrine tumor.  I did a surveillance bronchoscopy at here and there was no evidence of recurrence.  She has been followed with CT scans since then.  She was having some trouble with allergies recently, but other than that she has not had any respiratory issues.  No flushing or wheezing.  She is concerned about the finding of a groundglass opacity on the CT report.  Past Medical History:  Diagnosis Date   Acid reflux    Allergic rhinitis, cause unspecified    Allergy    Anemia    years ago    Cataract    Complication of anesthesia SLOW TO WAKE   Cough    DJD (degenerative joint disease)    spine   Frequency of urination    H/O hiatal hernia    small   Head cold    Headache(784.0)    migraqine- uses essential oils   HYPERLIPIDEMIA, MIXED 12/03/2006   PT STATES NOT TAKING MED AS PRESCRIBED   MIGRAINES, HX OF 12/03/2006   Neuromuscular disorder (HCC)    Nocturia    OSA on CPAP    Osteopenia    PONV (postoperative nausea and vomiting)    in past   Shortness of breath    Sleep apnea    cpap   SUI (stress urinary incontinence, female)    Urgency of urination    VERTIGO, HX OF 12/03/2006    Current Outpatient Medications  Medication Sig Dispense Refill   AMBULATORY NON FORMULARY MEDICATION Please provide cpap supplies: heated tubing, humidifier chamber, disposable filters, Mask/Mask frame/Head  gear per patient preference 1 Units 0   dimenhyDRINATE (DRAMAMINE) 50 MG tablet Take 50 mg by mouth every 8 (eight) hours as needed.     EPINEPHrine 0.3 mg/0.3 mL IJ SOAJ injection Inject 0.3 mg into the muscle as needed for anaphylaxis.     furosemide (LASIX) 20 MG tablet TAKE 1 TABLET BY MOUTH  DAILY (Patient taking differently: Take 20 mg by mouth as needed for edema. Takes it as needed when feet are swollen) 90 tablet 3   gabapentin (NEURONTIN) 300 MG capsule TAKE 1 CAPSULE BY MOUTH  DURING THE DAY AND 2  CAPSULES BY MOUTH AT  BEDTIME 270 capsule 3   ipratropium (ATROVENT) 0.03 % nasal spray Place 2 sprays into both nostrils 2 (two) times daily as needed for rhinitis. (Patient taking differently: Place 2 sprays into both nostrils daily.) 90 mL 3   levocetirizine (XYZAL) 5 MG tablet Take 1 tablet (5 mg total) by mouth every evening. 90 tablet 1   meloxicam (MOBIC) 7.5 MG tablet Take 7.5 mg by mouth daily as needed for pain.     montelukast (SINGULAIR) 10 MG tablet Take 1 tablet (10 mg total) by mouth at bedtime. 90 tablet 3   Multiple Vitamin (MULTIVITAMIN) tablet  Take 1 tablet by mouth daily.     pantoprazole (PROTONIX) 40 MG tablet TAKE 1 TABLET BY MOUTH  DAILY 90 tablet 3   tiZANidine (ZANAFLEX) 4 MG tablet Take 1 tablet (4 mg total) by mouth every 8 (eight) hours as needed for muscle spasms. 30 tablet 0   triamcinolone cream (KENALOG) 0.1 % Apply 1 application topically 2 (two) times daily. 453.6 g 3   rizatriptan (MAXALT) 5 MG tablet Take 1 tablet (5 mg total) by mouth as needed for migraine. May repeat in 2 hours if needed 30 tablet 3   rosuvastatin (CRESTOR) 20 MG tablet Take 1 tablet (20 mg total) by mouth daily. 90 tablet 3   No current facility-administered medications for this visit.    Physical Exam BP 123/75   Pulse 74   Resp 20   Ht 5\' 2"  (1.575 m)   Wt 170 lb (77.1 kg)   SpO2 96% Comment: RA  BMI 31.59 kg/m  70 year old woman in no acute distress Alert and oriented x3  with no focal deficits Lungs clear with equal breath sounds bilaterally Cardiac regular rate and rhythm  Diagnostic Tests: CT CHEST WITHOUT CONTRAST   TECHNIQUE: Multidetector CT imaging of the chest was performed following the standard protocol without IV contrast.   COMPARISON:  07/08/2018 chest CT.  07/14/2019 chest radiograph.   FINDINGS: Cardiovascular: Aortic atherosclerosis. Tortuous thoracic aorta. Borderline cardiomegaly. Three vessel coronary artery calcification.   Mediastinum/Nodes: No mediastinal or definite hilar adenopathy, given limitations of unenhanced CT. Tiny hiatal hernia.   Lungs/Pleura: No pleural fluid. Vague ground-glass in the subpleural left upper lobe on 34/8 is new, favored to represent subsegmental atelectasis.   Bilateral pulmonary nodules are similar. Examples include at 4 mm in the posterior right upper lobe on 44/8, 3 mm in the left lower lobe on 66/8, 2-3 mm in the right lower lobe on 76/8. No new or enlarging pulmonary nodules identified.   Upper Abdomen: Normal imaged portions of the liver, spleen, pancreas, adrenal glands, kidneys.   Musculoskeletal: No acute osseous abnormality.   IMPRESSION: 1. Stable bilateral pulmonary nodules, considered benign. 2. No findings to suggest recurrent or metastatic disease. 3. Coronary artery atherosclerosis. Aortic Atherosclerosis (ICD10-I70.0). 4.  Tiny hiatal hernia.     Electronically Signed   By: Abigail Miyamoto M.D.   On: 07/14/2020 13:37 I personally reviewed the CT images.  No evidence of recurrence at site of previous carcinoid tumor.  Multiple small stable nodules.  Vague GGO in the left upper lobe.  Impression: Christina Hayes is a 70 year old woman with a history of hyperlipidemia, obstructive sleep apnea, migraines, hiatal hernia, reflux, and carcinoid tumor of the bronchus.   Endobronchial carcinoid tumor-she is now 7 years out from endobronchial resection and laser ablation with no  evidence of recurrent disease.  We will continue to follow her annually.  Groundglass opacity left upper lobe-very faint.  Likely atelectasis per radiology.  Recommendation would be CT in 1 year which we will be doing anyway.  Plan: Return in 1 year with CT chest  Melrose Nakayama, MD [facility] [phone]

## 2020-07-21 ENCOUNTER — Ambulatory Visit (INDEPENDENT_AMBULATORY_CARE_PROVIDER_SITE_OTHER): Payer: Medicare Other | Admitting: *Deleted

## 2020-07-21 DIAGNOSIS — J309 Allergic rhinitis, unspecified: Secondary | ICD-10-CM

## 2020-07-28 ENCOUNTER — Ambulatory Visit (INDEPENDENT_AMBULATORY_CARE_PROVIDER_SITE_OTHER): Payer: Medicare Other | Admitting: *Deleted

## 2020-07-28 ENCOUNTER — Encounter: Payer: Self-pay | Admitting: Family Medicine

## 2020-07-28 DIAGNOSIS — J309 Allergic rhinitis, unspecified: Secondary | ICD-10-CM

## 2020-07-28 MED ORDER — ROSUVASTATIN CALCIUM 20 MG PO TABS: 20.0000 mg | ORAL_TABLET | Freq: Every day | ORAL | 3 refills | Status: DC

## 2020-08-04 ENCOUNTER — Ambulatory Visit (INDEPENDENT_AMBULATORY_CARE_PROVIDER_SITE_OTHER): Payer: Medicare Other | Admitting: *Deleted

## 2020-08-04 DIAGNOSIS — J309 Allergic rhinitis, unspecified: Secondary | ICD-10-CM

## 2020-08-09 ENCOUNTER — Other Ambulatory Visit: Payer: Self-pay

## 2020-08-09 ENCOUNTER — Ambulatory Visit (INDEPENDENT_AMBULATORY_CARE_PROVIDER_SITE_OTHER): Payer: Medicare Other | Admitting: Pharmacist

## 2020-08-09 DIAGNOSIS — E782 Mixed hyperlipidemia: Secondary | ICD-10-CM | POA: Diagnosis not present

## 2020-08-09 DIAGNOSIS — G8929 Other chronic pain: Secondary | ICD-10-CM

## 2020-08-09 DIAGNOSIS — F5101 Primary insomnia: Secondary | ICD-10-CM

## 2020-08-09 MED ORDER — ROSUVASTATIN CALCIUM 20 MG PO TABS: 20.0000 mg | ORAL_TABLET | Freq: Every day | ORAL | 3 refills | Status: DC

## 2020-08-09 NOTE — Patient Instructions (Signed)
Visit Information  PATIENT GOALS:  Goals Addressed             This Visit's Progress    Medication Management       Patient Goals/Self-Care Activities Over the next 14 days, patient will:  take medications as prescribed  Follow Up Plan: Telephone follow up appointment with care management team member scheduled for:  2 weeks        Patient verbalizes understanding of instructions provided today and agrees to view in [place].   Telephone follow up appointment with care management team member scheduled for: 2 weeks  Christina Hayes

## 2020-08-09 NOTE — Progress Notes (Signed)
Chronic Care Management Pharmacy Note  08/09/2020 Name:  Christina Hayes MRN:  [id number] DOB:  11-Dec-1950  Summary: addressed HTN, chronic pain, insomia. Trial of melatonin 5-10mg nightly was considered "hit or miss" and not effective for patient. unisom   Recommendations/Changes made from today's visit: Recommend trial of unisom OTC. If not effective, will consider next alternatives.  Plan: Touch base w/pharmacist in 2 wks  Subjective: Christina Hayes is an 70 y.o. year old female who is a primary patient of [name], DO.  The CCM team was consulted for assistance with disease management and care coordination needs.    Engaged with patient by telephone for follow up visit in response to provider referral for pharmacy case management and/or care coordination services.   Consent to Services:  The patient was given information about Chronic Care Management services, agreed to services, and gave verbal consent prior to initiation of services.  Please see initial visit note for detailed documentation.   Patient Care Team: [name], DO as PCP - General (Family Medicine)  Objective:  Lab Results  Component Value Date   CREATININE 0.75 04/26/2020   CREATININE 0.82 10/14/2019   CREATININE 0.95 04/30/2019    Lab Results  Component Value Date   HGBA1C 5.8 08/09/2011   Last diabetic Eye exam: No results found for: HMDIABEYEEXA  Last diabetic Foot exam: No results found for: HMDIABFOOTEX      Component Value Date/Time   CHOL 152 10/14/2019 1202   CHOL 286 (H) 04/30/2019 0946   TRIG 115 10/14/2019 1202   HDL 68 10/14/2019 1202   HDL 63 04/30/2019 0946   CHOLHDL 2.2 10/14/2019 1202   VLDL 17 07/13/2015 1113   LDLCALC 64 10/14/2019 1202   LDLDIRECT 231.2 03/28/2010 0948    Hepatic Function Latest Ref Rng & Units 04/26/2020 10/14/2019 04/30/2019  Total Protein 6.5 - 8.1 g/dL 7.5 7.3 6.9  Albumin 3.5 - 5.0 g/dL 4.3 - 4.3  AST 15 - 41 U/L 24 27 59(H)  ALT 0 - 44 U/L 18  20 54(H)  Alk Phosphatase 38 - 126 U/L 94 - 181(H)  Total Bilirubin 0.3 - 1.2 mg/dL 0.4 0.6 0.3  Bilirubin, Direct 0.0 - 0.3 mg/dL - - -    Lab Results  Component Value Date/Time   TSH 2.770 04/30/2019 09:46 AM   TSH 1.71 03/12/2016 02:34 PM    CBC Latest Ref Rng & Units 04/30/2019 10/16/2017 06/11/2016  WBC 3.4 - 10.8 x10E3/uL 6.3 6.7 8.3  Hemoglobin 11.1 - 15.9 g/dL 12.2 12.6 12.7  Hematocrit 34.0 - 46.6 % 37.1 37.1 38.2  Platelets 150 - 450 x10E3/uL 372 376 442(H)    Lab Results  Component Value Date/Time   VD25OH 24.70 (L) 04/26/2020 04:30 PM    Social History   Tobacco Use  Smoking Status Never  Smokeless Tobacco Never   BP Readings from Last 3 Encounters:  07/19/20 123/75  05/19/20 116/64  04/12/20 127/66   Pulse Readings from Last 3 Encounters:  07/19/20 74  05/19/20 74  04/12/20 65   Wt Readings from Last 3 Encounters:  07/19/20 170 lb (77.1 kg)  05/19/20 179 lb 3.2 oz (81.3 kg)  04/18/20 174 lb (78.9 kg)    Assessment: Review of patient past medical history, allergies, medications, health status, including review of consultants reports, laboratory and other test data, was performed as part of comprehensive evaluation and provision of chronic care management services.   SDOH:  (Social Determinants of Health) assessments and  interventions performed:    CCM Care Plan  Allergies  Allergen Reactions   Trazodone And Nefazodone Other (See Comments)    Nausea, fatique and sweating   Codeine Itching    *Can take Hydrocodone*   Protriptyline Palpitations   Tramadol Other (See Comments)    headache   Vivactil [Protriptyline Hcl] Palpitations    Medications Reviewed Today     Reviewed by [name], [facility] (Pharmacist) on 07/11/20 at 1416  Med List Status: <None>   Medication Order Taking? Sig Documenting Provider Last Dose Status Informant  AMBULATORY NON FORMULARY MEDICATION [id number] Yes Please provide cpap supplies: heated tubing, humidifier  chamber, disposable filters, Mask/Mask frame/Head gear per patient preference [name], DO Taking Active   dimenhyDRINATE (DRAMAMINE) 50 MG tablet [id number] Yes Take 50 mg by mouth every 8 (eight) hours as needed. [provider] Taking Active   EPINEPHrine 0.3 mg/0.3 mL IJ SOAJ injection [id number] Yes Inject 0.3 mg into the muscle as needed for anaphylaxis. [provider] Taking Active   furosemide (LASIX) 20 MG tablet [id number] Yes TAKE 1 TABLET BY MOUTH  DAILY  Patient taking differently: Take 20 mg by mouth as needed for edema. Takes it as needed when feet are swollen   [name], DO Taking Active   gabapentin (NEURONTIN) 300 MG capsule [id number] Yes TAKE 1 CAPSULE BY MOUTH  DURING THE DAY AND 2  CAPSULES BY MOUTH AT  BEDTIME Silverio Decamp, MD Taking Active   ipratropium (ATROVENT) 0.03 % nasal spray [id number] Yes Place 2 sprays into both nostrils 2 (two) times daily as needed for rhinitis.  Patient taking differently: Place 2 sprays into both nostrils daily.   [name], DO Taking Active   levocetirizine (XYZAL) 5 MG tablet [id number] Yes Take 1 tablet (5 mg total) by mouth every evening. Kennith Gain, MD Taking Active   meloxicam [facility]) 7.5 MG tablet [id number] Yes Take 7.5 mg by mouth daily as needed for pain. [provider] Taking Active   montelukast (SINGULAIR) 10 MG tablet [id number] Yes Take 1 tablet (10 mg total) by mouth at bedtime. [name], DO Taking Active   Multiple Vitamin (MULTIVITAMIN) tablet [id number] Yes Take 1 tablet by mouth daily. [provider] Taking Active   pantoprazole (PROTONIX) 40 MG tablet [id number] Yes TAKE 1 TABLET BY MOUTH  DAILY [name], DO Taking Active   rizatriptan (MAXALT) 5 MG tablet [id number]  Take 1 tablet (5 mg total) by mouth as needed for migraine. May repeat in 2 hours if needed [name], DO  Expired 01/27/20 2359   rosuvastatin (CRESTOR) 20 MG tablet  [id number]  Take 1 tablet (20 mg total) by mouth daily. [name], DO  Active   tiZANidine (ZANAFLEX) 4 MG tablet [id number] Yes Take 1 tablet (4 mg total) by mouth every 8 (eight) hours as needed for muscle spasms. [name], DO Taking Active   triamcinolone cream (KENALOG) 0.1 % [id number] Yes Apply 1 application topically 2 (two) times daily. [name], DO Taking Active             Patient Active Problem List   Diagnosis Date Noted   Osteoporosis 04/18/2020   Chronic rhinitis 12/28/2019   Osteoarthritis 10/18/2019   Weight gain 04/13/2019   GERD (gastroesophageal reflux disease) 04/13/2019   Insomnia 04/13/2019   DJD (degenerative joint disease) of knee 09/06/2015   Degenerative tear of medial meniscus 09/06/2015   Severe sleep apnea 07/21/2015   Osteopenia determined by x-ray 07/20/2015  Lumbago 06/15/2015   Left knee pain 06/10/2015   Carcinoid tumor 05/18/2014   Sleep pattern disturbance 01/26/2014   Obesity, unspecified 03/25/2012   HLD (hyperlipidemia) 12/03/2006   VERTIGO, HX OF 12/03/2006   Migraine 12/03/2006    Immunization History  Administered Date(s) Administered   Fluad Quad(high Dose 65+) 10/14/2019   Influenza, High Dose Seasonal PF 10/23/2016, 09/30/2018   Influenza,inj,Quad PF,6+ Mos 11/10/2015, 10/14/2017   Influenza-Unspecified 10/23/2016   PFIZER(Purple Top)SARS-COV-2 Vaccination 04/01/2019, 04/22/2019, 12/04/2019, 06/10/2020   Pneumococcal Conjugate-13 07/13/2015   Pneumococcal Polysaccharide-23 03/25/2012, 05/16/2017   Td 05/20/2003   Tdap 01/26/2014   Zoster, Live 04/25/2010    Conditions to be addressed/monitored: HLD and chronic pain, insomnia  There are no care plans that you recently modified to display for this patient.   Medication Assistance: None required.  Patient affirms current coverage meets needs.  Patient's preferred pharmacy is:  [id number] [place] [place] - [address] [place]Phone: [phone]: [phone] [facility]  [place] [address]Phone: [phone]: [phone]  Follow Up:  Patient agrees to Care Plan and Follow-up.  Plan: Telephone follow up appointment with care management team member scheduled for:  2 weeks  KDarius Bump

## 2020-08-10 ENCOUNTER — Ambulatory Visit (INDEPENDENT_AMBULATORY_CARE_PROVIDER_SITE_OTHER): Payer: Medicare Other

## 2020-08-10 DIAGNOSIS — J309 Allergic rhinitis, unspecified: Secondary | ICD-10-CM

## 2020-08-18 ENCOUNTER — Ambulatory Visit (INDEPENDENT_AMBULATORY_CARE_PROVIDER_SITE_OTHER): Payer: Medicare Other | Admitting: *Deleted

## 2020-08-18 DIAGNOSIS — J309 Allergic rhinitis, unspecified: Secondary | ICD-10-CM

## 2020-08-23 ENCOUNTER — Telehealth: Payer: Medicare Other

## 2020-08-25 ENCOUNTER — Encounter: Payer: Self-pay | Admitting: Family Medicine

## 2020-08-25 ENCOUNTER — Ambulatory Visit (INDEPENDENT_AMBULATORY_CARE_PROVIDER_SITE_OTHER): Payer: Medicare Other

## 2020-08-25 DIAGNOSIS — J309 Allergic rhinitis, unspecified: Secondary | ICD-10-CM

## 2020-08-31 ENCOUNTER — Ambulatory Visit (INDEPENDENT_AMBULATORY_CARE_PROVIDER_SITE_OTHER): Payer: Medicare Other | Admitting: Pharmacist

## 2020-08-31 ENCOUNTER — Other Ambulatory Visit: Payer: Self-pay

## 2020-08-31 DIAGNOSIS — E782 Mixed hyperlipidemia: Secondary | ICD-10-CM

## 2020-08-31 DIAGNOSIS — G8929 Other chronic pain: Secondary | ICD-10-CM

## 2020-08-31 DIAGNOSIS — M545 Low back pain, unspecified: Secondary | ICD-10-CM

## 2020-08-31 DIAGNOSIS — F5101 Primary insomnia: Secondary | ICD-10-CM

## 2020-08-31 NOTE — Progress Notes (Signed)
Chronic Care Management Pharmacy Note  08/31/2020 Name:  Christina Hayes MRN:  [id number] DOB:  16-Mar-1950  Summary: primarily addressed insomnia. HLD, chronic pain  Recommendations/Changes made from today's visit: none, pt finding OTC unisom to be "mostly" effective! Continue current management for insomnia. No other changes.  Plan:  f/u with pharmacist in 4 months  Subjective: Christina Hayes is an 70 y.o. year old female who is a primary patient of [name], DO.  The CCM team was consulted for assistance with disease management and care coordination needs.    Engaged with patient by telephone for follow up visit in response to provider referral for pharmacy case management and/or care coordination services.   Consent to Services:  The patient was given information about Chronic Care Management services, agreed to services, and gave verbal consent prior to initiation of services.  Please see initial visit note for detailed documentation.   Patient Care Team: [name], DO as PCP - General (Family Medicine)   Objective:  Lab Results  Component Value Date   CREATININE 0.75 04/26/2020   CREATININE 0.82 10/14/2019   CREATININE 0.95 04/30/2019    Lab Results  Component Value Date   HGBA1C 5.8 08/09/2011        Component Value Date/Time   CHOL 152 10/14/2019 1202   CHOL 286 (H) 04/30/2019 0946   TRIG 115 10/14/2019 1202   HDL 68 10/14/2019 1202   HDL 63 04/30/2019 0946   CHOLHDL 2.2 10/14/2019 1202   VLDL 17 07/13/2015 1113   LDLCALC 64 10/14/2019 1202   LDLDIRECT 231.2 03/28/2010 0948    Hepatic Function Latest Ref Rng & Units 04/26/2020 10/14/2019 04/30/2019  Total Protein 6.5 - 8.1 g/dL 7.5 7.3 6.9  Albumin 3.5 - 5.0 g/dL 4.3 - 4.3  AST 15 - 41 U/L 24 27 59(H)  ALT 0 - 44 U/L 18 20 54(H)  Alk Phosphatase 38 - 126 U/L 94 - 181(H)  Total Bilirubin 0.3 - 1.2 mg/dL 0.4 0.6 0.3  Bilirubin, Direct 0.0 - 0.3 mg/dL - - -    Lab Results  Component Value  Date/Time   TSH 2.770 04/30/2019 09:46 AM   TSH 1.71 03/12/2016 02:34 PM    CBC Latest Ref Rng & Units 04/30/2019 10/16/2017 06/11/2016  WBC 3.4 - 10.8 x10E3/uL 6.3 6.7 8.3  Hemoglobin 11.1 - 15.9 g/dL 12.2 12.6 12.7  Hematocrit 34.0 - 46.6 % 37.1 37.1 38.2  Platelets 150 - 450 x10E3/uL 372 376 442(H)    Lab Results  Component Value Date/Time   VD25OH 24.70 (L) 04/26/2020 04:30 PM    Social History   Tobacco Use  Smoking Status Never  Smokeless Tobacco Never   BP Readings from Last 3 Encounters:  07/19/20 123/75  05/19/20 116/64  04/12/20 127/66   Pulse Readings from Last 3 Encounters:  07/19/20 74  05/19/20 74  04/12/20 65   Wt Readings from Last 3 Encounters:  07/19/20 170 lb (77.1 kg)  05/19/20 179 lb 3.2 oz (81.3 kg)  04/18/20 174 lb (78.9 kg)    Assessment: Review of patient past medical history, allergies, medications, health status, including review of consultants reports, laboratory and other test data, was performed as part of comprehensive evaluation and provision of chronic care management services.   SDOH:  (Social Determinants of Health) assessments and interventions performed:    CCM Care Plan  Allergies  Allergen Reactions   Trazodone And Nefazodone Other (See Comments)    Nausea, fatique and sweating  Codeine Itching    *Can take Hydrocodone*   Protriptyline Palpitations   Tramadol Other (See Comments)    headache   Vivactil [Protriptyline Hcl] Palpitations    Medications Reviewed Today     Reviewed by [name], [facility] (Pharmacist) on 08/31/20 at 1318  Med List Status: <None>   Medication Order Taking? Sig Documenting Provider Last Dose Status Informant  AMBULATORY NON FORMULARY MEDICATION [id number] Yes Please provide cpap supplies: heated tubing, humidifier chamber, disposable filters, Mask/Mask frame/Head gear per patient preference [name], DO Taking Active   dimenhyDRINATE (DRAMAMINE) 50 MG tablet [id number] No Take 50 mg by  mouth every 8 (eight) hours as needed.  Patient not taking: Reported on 08/31/2020   [provider] Not Taking Active   EPINEPHrine 0.3 mg/0.3 mL IJ SOAJ injection [id number] Yes Inject 0.3 mg into the muscle as needed for anaphylaxis. [provider] Taking Active   furosemide (LASIX) 20 MG tablet [id number] Yes TAKE 1 TABLET BY MOUTH  DAILY  Patient taking differently: Take 20 mg by mouth as needed for edema. Takes it as needed when feet are swollen   [name], DO Taking Active   gabapentin (NEURONTIN) 300 MG capsule [id number] Yes TAKE 1 CAPSULE BY MOUTH  DURING THE DAY AND 2  CAPSULES BY MOUTH AT  BEDTIME Silverio Decamp, MD Taking Active   ipratropium (ATROVENT) 0.03 % nasal spray [id number] Yes Place 2 sprays into both nostrils 2 (two) times daily as needed for rhinitis.  Patient taking differently: Place 2 sprays into both nostrils daily.   [name], DO Taking Active   levocetirizine (XYZAL) 5 MG tablet [id number] Yes Take 1 tablet (5 mg total) by mouth every evening. Kennith Gain, MD Taking Active   meloxicam [facility]) 7.5 MG tablet [id number] Yes Take 7.5 mg by mouth daily as needed for pain. [provider] Taking Active   montelukast (SINGULAIR) 10 MG tablet [id number] Yes Take 1 tablet (10 mg total) by mouth at bedtime. [name], DO Taking Active   Multiple Vitamin (MULTIVITAMIN) tablet [id number] Yes Take 1 tablet by mouth daily. [provider] Taking Active   pantoprazole (PROTONIX) 40 MG tablet [id number] Yes TAKE 1 TABLET BY MOUTH  DAILY [name], DO Taking Active   rizatriptan (MAXALT) 5 MG tablet [id number]  Take 1 tablet (5 mg total) by mouth as needed for migraine. May repeat in 2 hours if needed [name], DO  Expired 01/27/20 2359   rosuvastatin (CRESTOR) 20 MG tablet [id number] Yes Take 1 tablet (20 mg total) by mouth daily. [name], DO Taking Active   tiZANidine (ZANAFLEX) 4 MG tablet [id number]  Yes Take 1 tablet (4 mg total) by mouth every 8 (eight) hours as needed for muscle spasms. [name], DO Taking Active   triamcinolone cream (KENALOG) 0.1 % [id number] Yes Apply 1 application topically 2 (two) times daily. [name], DO Taking Active             Patient Active Problem List   Diagnosis Date Noted   Osteoporosis 04/18/2020   Chronic rhinitis 12/28/2019   Osteoarthritis 10/18/2019   Weight gain 04/13/2019   GERD (gastroesophageal reflux disease) 04/13/2019   Insomnia 04/13/2019   DJD (degenerative joint disease) of knee 09/06/2015   Degenerative tear of medial meniscus 09/06/2015   Severe sleep apnea 07/21/2015   Osteopenia determined by x-ray 07/20/2015   Lumbago 06/15/2015   Left knee pain 06/10/2015   Carcinoid tumor 05/18/2014   Sleep pattern disturbance  01/26/2014   Obesity, unspecified 03/25/2012   HLD (hyperlipidemia) 12/03/2006   VERTIGO, HX OF 12/03/2006   Migraine 12/03/2006    Immunization History  Administered Date(s) Administered   Fluad Quad(high Dose 65+) 10/14/2019   Influenza, High Dose Seasonal PF 10/23/2016, 09/30/2018   Influenza,inj,Quad PF,6+ Mos 11/10/2015, 10/14/2017   Influenza-Unspecified 10/23/2016   PFIZER(Purple Top)SARS-COV-2 Vaccination 04/01/2019, 04/22/2019, 12/04/2019, 06/10/2020   Pneumococcal Conjugate-13 07/13/2015   Pneumococcal Polysaccharide-23 03/25/2012, 05/16/2017   Td 05/20/2003   Tdap 01/26/2014   Zoster, Live 04/25/2010    Conditions to be addressed/monitored: HLD and chronic pain, insomnia  Care Plan : Medication Management  Updates made by [name], [place] since 08/31/2020 12:00 AM     Problem: HLD, chronic pain, insomnia      Long-Range Goal: Disease Progression Prevention   Start Date: 07/11/2020  Recent Progress: On track  Priority: High  Note:   Current Barriers:  None at present  Pharmacist Clinical Goal(s):  Over the next 120 days, patient will adhere to plan to optimize  therapeutic regimen for insomnia as evidenced by report of adherence to recommended medication management changes through collaboration with PharmD and provider.   Interventions: 1:1 collaboration with [name], DO regarding development and update of comprehensive plan of care as evidenced by provider attestation and co-signature Inter-disciplinary care team collaboration (see longitudinal plan of care) Comprehensive medication review performed; medication list updated in electronic medical record  Hyperlipidemia:  Controlled; current treatment:rosuvastatin 62m daily;   Recommended continue current regimen  Chronic Pain (Ostoearthritis)  Controlled; current treatment: PRN meloxicam, PRN tizanidine, physical therapy; not currently taking gabapentin & doing well without it  Recommend: continue current regimen  Insomnia  Controlled; current treatment: unisom 576mnightly (failed melatonin but unisom working "mostly" well!)  Recommend continue current regimen  Patient Goals/Self-Care Activities Over the next 120 days, patient will:  take medications as prescribed  Follow Up Plan: Telephone follow up appointment with care management team member scheduled for:  4 months     Medication Assistance: None required.  Patient affirms current coverage meets needs.  Patient's preferred pharmacy is:  [id number] [place] - [address]: [phone]: [phone]  [address] [id number]: [phone]: [phone] pill box? No - has a system in her drawer/container for AM & PM, works for her Pt endorses 80% compliance  Follow Up:  Patient agrees to Care Plan and Follow-up.  Plan: Telephone follow up appointment with care management team member  scheduled for:  4 months  KeeDarius Bump

## 2020-08-31 NOTE — Patient Instructions (Signed)
Visit Information  PATIENT GOALS:  Goals Addressed             This Visit's Progress    Medication Management       Patient Goals/Self-Care Activities Over the next 120 days, patient will:  take medications as prescribed  Follow Up Plan: Telephone follow up appointment with care management team member scheduled for:  4 months        Patient verbalizes understanding of instructions provided today and agrees to view in [place].   Telephone follow up appointment with care management team member scheduled for: 4 months  Christina Hayes

## 2020-09-08 ENCOUNTER — Ambulatory Visit (INDEPENDENT_AMBULATORY_CARE_PROVIDER_SITE_OTHER): Payer: Medicare Other | Admitting: *Deleted

## 2020-09-08 DIAGNOSIS — J309 Allergic rhinitis, unspecified: Secondary | ICD-10-CM | POA: Diagnosis not present

## 2020-09-15 ENCOUNTER — Ambulatory Visit (INDEPENDENT_AMBULATORY_CARE_PROVIDER_SITE_OTHER): Payer: Medicare Other | Admitting: *Deleted

## 2020-09-15 DIAGNOSIS — J309 Allergic rhinitis, unspecified: Secondary | ICD-10-CM

## 2020-09-22 ENCOUNTER — Ambulatory Visit: Payer: Medicare Other | Admitting: Allergy

## 2020-09-22 ENCOUNTER — Encounter: Payer: Self-pay | Admitting: Allergy

## 2020-09-22 ENCOUNTER — Ambulatory Visit: Payer: Self-pay | Admitting: *Deleted

## 2020-09-22 ENCOUNTER — Other Ambulatory Visit: Payer: Self-pay

## 2020-09-22 VITALS — BP 130/78 | HR 60 | Temp 98.2°F | Resp 16 | Ht 61.5 in | Wt 178.2 lb

## 2020-09-22 DIAGNOSIS — H1045 Other chronic allergic conjunctivitis: Secondary | ICD-10-CM | POA: Diagnosis not present

## 2020-09-22 DIAGNOSIS — J309 Allergic rhinitis, unspecified: Secondary | ICD-10-CM | POA: Diagnosis not present

## 2020-09-22 DIAGNOSIS — J3089 Other allergic rhinitis: Secondary | ICD-10-CM

## 2020-09-22 MED ORDER — EPINEPHRINE 0.3 MG/0.3ML IJ SOAJ: 0.3000 mg | INTRAMUSCULAR | 1 refills | Status: DC | PRN

## 2020-09-22 MED ORDER — LEVOCETIRIZINE DIHYDROCHLORIDE 5 MG PO TABS: 5.0000 mg | ORAL_TABLET | Freq: Every evening | ORAL | 2 refills | Status: DC

## 2020-09-22 MED ORDER — MONTELUKAST SODIUM 10 MG PO TABS
10.0000 mg | ORAL_TABLET | Freq: Every day | ORAL | 2 refills | Status: DC
Start: 2020-09-22 — End: 2020-10-13

## 2020-09-22 NOTE — Patient Instructions (Addendum)
Allergic rhinitis with conjunctivitis with postnasal drip -Symptoms are improved  - Continue avoidance measures for grass pollen, weed pollen, tree pollen, mold, dust mite - Continue singular 10 mg nightly - Continue Xyzal 5 mg daily   - Use nasal Atrovent 2 sprays each nostril 3-4 times a day for max dosing.  If you still have same degree of nasal drainage let us know and would then have you try combination of atrovent and astelin -for watery eyes can use over-the-counter Pataday 1 drop each eye daily as needed -Continue allergen immunotherapy per protocol have access to your epinephrine device  Follow-up in 12 months or sooner if needed

## 2020-09-22 NOTE — Progress Notes (Signed)
Follow-up Note  RE: BLU MCGLAUN MRN: [id number] DOB: 04-05-1950 Date of Office Visit: 09/22/2020   History of present illness: Christina Hayes is a 70 y.o. female presenting today for follow-up of allergic rhinitis with conjunctivitis with postnasal drip.  She was last seen in the office on 05/19/20 by myself.  She has not had any major health changes, surgeries or hospitalizations since her last visit. She did start on allergen immunotherapy after last visit and is in buildup phase.  She has not had any large local or systemic reactions related to immunotherapy.  She is tolerating it well. She has already noticed significant improvement in her symptoms.  She states her husband is thrilled that she doesn't have to use a much tissue now.  She reports her nasal drainage is much improved. Will use nasal atrovent occasionally if se has nasal drainage.  She does continue singulair and xyzal daily.   Review of systems: Review of Systems  Constitutional: Negative.   HENT: Negative.    Eyes: Negative.   Respiratory: Negative.    Cardiovascular: Negative.   Gastrointestinal: Negative.   Musculoskeletal: Negative.   Skin: Negative.   Neurological: Negative.    All other systems negative unless noted above in HPI  Past medical/social/surgical/family history have been reviewed and are unchanged unless specifically indicated below.  No changes  Medication List: Current Outpatient Medications  Medication Sig Dispense Refill   AMBULATORY NON FORMULARY MEDICATION Please provide cpap supplies: heated tubing, humidifier chamber, disposable filters, Mask/Mask frame/Head gear per patient preference 1 Units 0   EPINEPHrine 0.3 mg/0.3 mL IJ SOAJ injection Inject 0.3 mg into the muscle as needed for anaphylaxis.     ipratropium (ATROVENT) 0.03 % nasal spray Place 2 sprays into both nostrils 2 (two) times daily as needed for rhinitis. (Patient taking differently: Place 2 sprays into both nostrils  daily.) 90 mL 3   meloxicam (MOBIC) 7.5 MG tablet Take 7.5 mg by mouth daily as needed for pain.     Multiple Vitamin (MULTIVITAMIN) tablet Take 1 tablet by mouth daily.     triamcinolone cream (KENALOG) 0.1 % Apply 1 application topically 2 (two) times daily. 453.6 g 3   dimenhyDRINATE (DRAMAMINE) 50 MG tablet Take 50 mg by mouth every 8 (eight) hours as needed. (Patient not taking: Reported on 09/22/2020)     furosemide (LASIX) 20 MG tablet TAKE 1 TABLET BY MOUTH  DAILY (Patient not taking: Reported on 09/22/2020) 90 tablet 3   gabapentin (NEURONTIN) 300 MG capsule TAKE 1 CAPSULE BY MOUTH  DURING THE DAY AND 2  CAPSULES BY MOUTH AT  BEDTIME (Patient not taking: Reported on 09/22/2020) 270 capsule 3   levocetirizine (XYZAL) 5 MG tablet Take 1 tablet (5 mg total) by mouth every evening. 90 tablet 2   montelukast (SINGULAIR) 10 MG tablet Take 1 tablet (10 mg total) by mouth at bedtime. 90 tablet 2   pantoprazole (PROTONIX) 40 MG tablet TAKE 1 TABLET BY MOUTH  DAILY (Patient not taking: Reported on 09/22/2020) 90 tablet 3   rizatriptan (MAXALT) 5 MG tablet Take 1 tablet (5 mg total) by mouth as needed for migraine. May repeat in 2 hours if needed 30 tablet 3   rosuvastatin (CRESTOR) 20 MG tablet Take 1 tablet (20 mg total) by mouth daily. (Patient not taking: Reported on 09/22/2020) 90 tablet 3   tiZANidine (ZANAFLEX) 4 MG tablet Take 1 tablet (4 mg total) by mouth every 8 (eight) hours as needed for muscle  spasms. (Patient not taking: Reported on 09/22/2020) 30 tablet 0   No current facility-administered medications for this visit.     Known medication allergies: Allergies  Allergen Reactions   Trazodone And Nefazodone Other (See Comments)    Nausea, fatique and sweating   Codeine Itching    *Can take Hydrocodone*   Protriptyline Palpitations   Tramadol Other (See Comments)    headache   Vivactil [Protriptyline Hcl] Palpitations     Physical examination: Blood pressure 130/78, pulse 60,  temperature 98.2 F (36.8 C), resp. rate 16, height 5' 1.5" (1.562 m), weight 178 lb 3.2 oz (80.8 kg), SpO2 96 %.  General: Alert, interactive, in no acute distress. HEENT: PERRLA, TMs pearly gray, turbinates non-edematous without discharge, post-pharynx non erythematous. Neck: Supple without lymphadenopathy. Lungs: Clear to auscultation without wheezing, rhonchi or rales. {no increased work of breathing. CV: Normal S1, S2 without murmurs. Abdomen: Nondistended, nontender. Skin: Warm and dry, without lesions or rashes. Extremities:  No clubbing, cyanosis or edema. Neuro:   Grossly intact.  Diagnositics/Labs: None today  Assessment and plan: Allergic rhinitis with conjunctivitis with postnasal drip -Symptoms are improved  - Continue avoidance measures for grass pollen, weed pollen, tree pollen, mold, dust mite - Continue singular 10 mg nightly - Continue Xyzal 5 mg daily   - Use nasal Atrovent 2 sprays each nostril 3-4 times a day for max dosing.  If you still have same degree of nasal drainage let us know and would then have you try combination of atrovent and astelin -for watery eyes can use over-the-counter Pataday 1 drop each eye daily as needed -Continue allergen immunotherapy per protocol have access to your epinephrine device  Follow-up in 12 months or sooner if needed  I appreciate the opportunity to take part in [place]'s care. Please do not hesitate to contact me with questions.  Sincerely,   Prudy Feeler, MD Allergy/Immunology [facility]

## 2020-09-29 ENCOUNTER — Ambulatory Visit (INDEPENDENT_AMBULATORY_CARE_PROVIDER_SITE_OTHER): Payer: Medicare Other

## 2020-09-29 DIAGNOSIS — J309 Allergic rhinitis, unspecified: Secondary | ICD-10-CM | POA: Diagnosis not present

## 2020-10-06 ENCOUNTER — Ambulatory Visit (INDEPENDENT_AMBULATORY_CARE_PROVIDER_SITE_OTHER): Payer: Medicare Other | Admitting: *Deleted

## 2020-10-06 DIAGNOSIS — J309 Allergic rhinitis, unspecified: Secondary | ICD-10-CM

## 2020-10-07 ENCOUNTER — Other Ambulatory Visit: Payer: Self-pay | Admitting: Family Medicine

## 2020-10-07 DIAGNOSIS — K219 Gastro-esophageal reflux disease without esophagitis: Secondary | ICD-10-CM

## 2020-10-13 ENCOUNTER — Ambulatory Visit (INDEPENDENT_AMBULATORY_CARE_PROVIDER_SITE_OTHER): Payer: Medicare Other | Admitting: Family Medicine

## 2020-10-13 ENCOUNTER — Encounter: Payer: Self-pay | Admitting: Family Medicine

## 2020-10-13 ENCOUNTER — Other Ambulatory Visit: Payer: Self-pay

## 2020-10-13 VITALS — BP 117/74 | HR 60 | Ht 61.5 in | Wt 178.0 lb

## 2020-10-13 DIAGNOSIS — Z23 Encounter for immunization: Secondary | ICD-10-CM

## 2020-10-13 DIAGNOSIS — K219 Gastro-esophageal reflux disease without esophagitis: Secondary | ICD-10-CM

## 2020-10-13 DIAGNOSIS — G4720 Circadian rhythm sleep disorder, unspecified type: Secondary | ICD-10-CM

## 2020-10-13 DIAGNOSIS — M159 Polyosteoarthritis, unspecified: Secondary | ICD-10-CM

## 2020-10-13 DIAGNOSIS — G43009 Migraine without aura, not intractable, without status migrainosus: Secondary | ICD-10-CM | POA: Diagnosis not present

## 2020-10-13 DIAGNOSIS — M8949 Other hypertrophic osteoarthropathy, multiple sites: Secondary | ICD-10-CM

## 2020-10-13 DIAGNOSIS — M150 Primary generalized (osteo)arthritis: Secondary | ICD-10-CM

## 2020-10-13 DIAGNOSIS — E782 Mixed hyperlipidemia: Secondary | ICD-10-CM | POA: Diagnosis not present

## 2020-10-13 MED ORDER — MELOXICAM 7.5 MG PO TABS: 7.5000 mg | ORAL_TABLET | Freq: Every day | ORAL | 1 refills | Status: DC | PRN

## 2020-10-13 MED ORDER — ROSUVASTATIN CALCIUM 20 MG PO TABS: 20.0000 mg | ORAL_TABLET | Freq: Every day | ORAL | 2 refills | Status: DC

## 2020-10-13 MED ORDER — TRIAMCINOLONE ACETONIDE 0.1 % EX CREA: 1.0000 "application " | TOPICAL_CREAM | Freq: Two times a day (BID) | CUTANEOUS | 3 refills | Status: DC

## 2020-10-13 MED ORDER — RIZATRIPTAN BENZOATE 5 MG PO TABS: 5.0000 mg | ORAL_TABLET | ORAL | 3 refills | Status: DC | PRN

## 2020-10-13 MED ORDER — MONTELUKAST SODIUM 10 MG PO TABS: 10.0000 mg | ORAL_TABLET | Freq: Every day | ORAL | 2 refills | Status: DC

## 2020-10-13 MED ORDER — PANTOPRAZOLE SODIUM 40 MG PO TBEC: 40.0000 mg | DELAYED_RELEASE_TABLET | Freq: Every day | ORAL | 3 refills | Status: DC

## 2020-10-13 NOTE — Assessment & Plan Note (Signed)
She will continue Maxalt as needed.

## 2020-10-13 NOTE — Assessment & Plan Note (Signed)
She gets good relief with IcyHot.  She will continue this.  Meloxicam renewed as well.

## 2020-10-13 NOTE — Assessment & Plan Note (Signed)
She is doing well with Unisom at this time.  We will continue this.

## 2020-10-13 NOTE — Progress Notes (Signed)
Christina Hayes - 70 y.o. female MRN [id number]  Date of birth: [birth year]  Subjective Chief Complaint  Patient presents with   Follow-up    HPI Christina Hayes is a 70 year old female here today for follow-up visit.  She states that overall she is doing well.  She has continued to have some intermittent joint and muscle pain.  She has been using icy hot for this which seems to work pretty well.  She has not noted any joint swelling.  She is doing well with rosuvastatin for management of hyperlipidemia.  She is currently seeing an allergist and receiving immunotherapy.  This is going well at this time.  She does continue on the Singulair.  Her migraines remain stable with Maxalt as needed.  ROS:  A comprehensive ROS was completed and negative except as noted per HPI  Allergies  Allergen Reactions   Trazodone And Nefazodone Other (See Comments)    Nausea, fatique and sweating   Codeine Itching    *Can take Hydrocodone*   Protriptyline Palpitations   Tramadol Other (See Comments)    headache   Vivactil [Protriptyline Hcl] Palpitations    Past Medical History:  Diagnosis Date   Acid reflux    Allergic rhinitis, cause unspecified    Allergy    Anemia    years ago    Cataract    Complication of anesthesia SLOW TO WAKE   Cough    DJD (degenerative joint disease)    spine   Frequency of urination    H/O hiatal hernia    small   Head cold    Headache(784.0)    migraqine- uses essential oils   HYPERLIPIDEMIA, MIXED 12/03/2006   PT STATES NOT TAKING MED AS PRESCRIBED   MIGRAINES, HX OF 12/03/2006   Neuromuscular disorder (HCC)    Nocturia    OSA on CPAP    Osteopenia    PONV (postoperative nausea and vomiting)    in past   Shortness of breath    Sleep apnea    cpap   SUI (stress urinary incontinence, female)    Urgency of urination    VERTIGO, HX OF 12/03/2006    Past Surgical History:  Procedure Laterality Date   COLONOSCOPY     PUBOVAGINAL SLING  04/30/2011    Procedure: Gaynelle Arabian;  Surgeon: Bernestine Amass, MD;  Location: [facility];  Service: Urology;  Laterality: N/A;  sub urethral sling    possible ower   RIGID BRONCHOSCOPY N/A 04/23/2013   Procedure: LASER BRONCHOSCOPY;  Surgeon: Melrose Nakayama, MD;  Location: [place];  Service: Thoracic;  Laterality: N/A;   [place] Bilateral 03/26/2013   Procedure: VIDEO BRONCHOSCOPY WITHOUT FLUORO;  Surgeon: Tanda Rockers, MD;  Location: [facility];  Service: Cardiopulmonary;  Laterality: Bilateral;   VIDEO BRONCHOSCOPY N/A 04/23/2013   Procedure: VIDEO BRONCHOSCOPY;  Surgeon: Melrose Nakayama, MD;  Location: [place];  Service: Thoracic;  Laterality: N/A;   VIDEO BRONCHOSCOPY N/A 05/28/2014   Procedure: VIDEO BRONCHOSCOPY;  Surgeon: Melrose Nakayama, MD;  Location: [place];  Service: Thoracic;  Laterality: N/A;    Social History   Socioeconomic History   Marital status: Married    Spouse name: Alvester Chou   Number of children: 1   Years of education: 12   Highest education level: 12th grade  Occupational History   Occupation: Retired    Comment: Emergency planning/management officer  Tobacco Use   Smoking status: Never   Smokeless tobacco: Never  Vaping Use   Vaping Use: Never used  Substance and Sexual Activity   Alcohol use: No   Drug use: No   Sexual activity: Not Currently    Partners: Male    Birth control/protection: Surgical  Other Topics Concern   Not on file  Social History Narrative   HSG   Married 23-divorced, remarried '08   1 daughter '79   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 Checks emails. Plays games on computer, reads and shops online. Her hobbies include reading and art.   Social Determinants of Health   Financial Resource Strain: Low Risk    Difficulty of Paying Living Expenses: Not hard at all   Food Insecurity: No Food Insecurity   Worried About Charity fundraiser in the Last Year: Never true   [place] in the Last Year: Never true  Transportation Needs: No Transportation Needs   Lack of Transportation (Medical): No   Lack of Transportation (Non-Medical): No  Physical Activity: Inactive   Days of Exercise per Week: 0 days   Minutes of Exercise per Session: 0 min  Stress: No Stress Concern Present   Feeling of Stress : Not at all  Social Connections: Socially Isolated   Frequency of Communication with Friends and Family: Twice a week   Frequency of Social Gatherings with Friends and Family: Never   Attends Religious Services: Never   Marine scientist or Organizations: No   Attends Music therapist: Never   Marital Status: Married    Family History  Problem Relation Age of Onset   Cancer Mother        Colon Cancer survivor   Diabetes Mother    Colon cancer Mother    Diabetes Father    Hyperlipidemia Father    Alzheimer's disease Father    Cancer Maternal Grandmother        Breast Cancer   Breast cancer Maternal Grandmother    Cancer Daughter 25       stage III-B breast CA   Breast cancer Daughter    Colon polyps Brother    Allergies Daughter    Esophageal cancer Neg Hx    Rectal cancer Neg Hx    Stomach cancer Neg Hx     Health Maintenance  Topic Date Due   COVID-19 Vaccine (5 - Booster for [place] series) 10/29/2020 (Originally 10/11/2020)   Zoster Vaccines- Shingrix (1 of 2) 01/12/2021 (Originally 02/27/1969)   MAMMOGRAM  05/17/2022   COLONOSCOPY (Pts 45-36yrs Insurance coverage will need to be confirmed)  03/06/2023   TETANUS/TDAP  01/27/2024   INFLUENZA VACCINE  Completed   DEXA SCAN  Completed   Hepatitis C Screening  Completed   HPV VACCINES  Aged Out      ----------------------------------------------------------------------------------------------------------------------------------------------------------------------------------------------------------------- Physical Exam BP 117/74   Pulse 60   Ht 5' 1.5" (1.562 m)   Wt 178 lb (80.7 kg)   SpO2 98%   BMI 33.09 kg/m   Physical Exam Constitutional:      Appearance: Normal appearance.  Eyes:     General: No scleral icterus. Cardiovascular:     Rate and Rhythm: Normal rate and regular rhythm.     Pulses: Normal pulses.     Heart sounds: Normal heart sounds.  Musculoskeletal:     Cervical back: Neck  supple.  Skin:    General: Skin is warm and dry.  Neurological:     General: No focal deficit present.     Mental Status: She is alert.  Psychiatric:        Mood and Affect: Mood normal.        Behavior: Behavior normal.    ------------------------------------------------------------------------------------------------------------------------------------------------------------------------------------------------------------------- Assessment and Plan  HLD (hyperlipidemia) She is tolerating rosuvastatin well.  Sleep pattern disturbance She is doing well with Unisom at this time.  We will continue this.  Migraine She will continue Maxalt as needed.  Osteoarthritis She gets good relief with IcyHot.  She will continue this.  Meloxicam renewed as well.   Meds ordered this encounter  Medications   montelukast (SINGULAIR) 10 MG tablet    Sig: Take 1 tablet (10 mg total) by mouth at bedtime.    Dispense:  90 tablet    Refill:  2   meloxicam (MOBIC) 7.5 MG tablet    Sig: Take 1 tablet (7.5 mg total) by mouth daily as needed for pain.    Dispense:  90 tablet    Refill:  1   pantoprazole (PROTONIX) 40 MG tablet    Sig: Take 1 tablet (40 mg total) by mouth daily.    Dispense:  90 tablet    Refill:  3    Requesting 1 year supply   rosuvastatin (CRESTOR) 20 MG tablet     Sig: Take 1 tablet (20 mg total) by mouth daily.    Dispense:  90 tablet    Refill:  2   rizatriptan (MAXALT) 5 MG tablet    Sig: Take 1 tablet (5 mg total) by mouth as needed for migraine. May repeat in 2 hours if needed    Dispense:  30 tablet    Refill:  3   triamcinolone cream (KENALOG) 0.1 %    Sig: Apply 1 application topically 2 (two) times daily.    Dispense:  453.6 g    Refill:  3    Return in about 6 months (around 04/12/2021) for HLD.    This visit occurred during the SARS-CoV-2 public health emergency.  Safety protocols were in place, including screening questions prior to the visit, additional usage of staff PPE, and extensive cleaning of exam room while observing appropriate contact time as indicated for disinfecting solutions.

## 2020-10-13 NOTE — Assessment & Plan Note (Signed)
She is tolerating rosuvastatin well.

## 2020-10-20 ENCOUNTER — Ambulatory Visit (INDEPENDENT_AMBULATORY_CARE_PROVIDER_SITE_OTHER): Payer: Medicare Other

## 2020-10-20 DIAGNOSIS — J309 Allergic rhinitis, unspecified: Secondary | ICD-10-CM | POA: Diagnosis not present

## 2020-10-27 ENCOUNTER — Ambulatory Visit (INDEPENDENT_AMBULATORY_CARE_PROVIDER_SITE_OTHER): Payer: Medicare Other | Admitting: *Deleted

## 2020-10-27 DIAGNOSIS — J309 Allergic rhinitis, unspecified: Secondary | ICD-10-CM | POA: Diagnosis not present

## 2020-11-04 ENCOUNTER — Ambulatory Visit (INDEPENDENT_AMBULATORY_CARE_PROVIDER_SITE_OTHER): Payer: Medicare Other | Admitting: *Deleted

## 2020-11-04 DIAGNOSIS — J309 Allergic rhinitis, unspecified: Secondary | ICD-10-CM

## 2020-11-10 ENCOUNTER — Ambulatory Visit (INDEPENDENT_AMBULATORY_CARE_PROVIDER_SITE_OTHER): Payer: Medicare Other | Admitting: *Deleted

## 2020-11-10 DIAGNOSIS — J309 Allergic rhinitis, unspecified: Secondary | ICD-10-CM

## 2020-11-15 ENCOUNTER — Encounter: Payer: Self-pay | Admitting: Family Medicine

## 2020-11-17 ENCOUNTER — Ambulatory Visit (INDEPENDENT_AMBULATORY_CARE_PROVIDER_SITE_OTHER): Payer: Medicare Other | Admitting: *Deleted

## 2020-11-17 DIAGNOSIS — J309 Allergic rhinitis, unspecified: Secondary | ICD-10-CM

## 2020-11-24 ENCOUNTER — Ambulatory Visit (INDEPENDENT_AMBULATORY_CARE_PROVIDER_SITE_OTHER): Payer: Medicare Other | Admitting: *Deleted

## 2020-11-24 DIAGNOSIS — J309 Allergic rhinitis, unspecified: Secondary | ICD-10-CM | POA: Diagnosis not present

## 2020-12-06 ENCOUNTER — Ambulatory Visit (INDEPENDENT_AMBULATORY_CARE_PROVIDER_SITE_OTHER): Payer: Medicare Other | Admitting: *Deleted

## 2020-12-06 DIAGNOSIS — J309 Allergic rhinitis, unspecified: Secondary | ICD-10-CM

## 2020-12-14 ENCOUNTER — Ambulatory Visit (INDEPENDENT_AMBULATORY_CARE_PROVIDER_SITE_OTHER): Payer: Medicare Other

## 2020-12-14 DIAGNOSIS — J309 Allergic rhinitis, unspecified: Secondary | ICD-10-CM

## 2020-12-21 ENCOUNTER — Ambulatory Visit (INDEPENDENT_AMBULATORY_CARE_PROVIDER_SITE_OTHER): Payer: Medicare Other

## 2020-12-21 DIAGNOSIS — J309 Allergic rhinitis, unspecified: Secondary | ICD-10-CM

## 2020-12-22 ENCOUNTER — Ambulatory Visit (INDEPENDENT_AMBULATORY_CARE_PROVIDER_SITE_OTHER): Payer: Medicare Other | Admitting: Pharmacist

## 2020-12-22 ENCOUNTER — Other Ambulatory Visit: Payer: Self-pay

## 2020-12-22 DIAGNOSIS — E782 Mixed hyperlipidemia: Secondary | ICD-10-CM

## 2020-12-22 DIAGNOSIS — G4720 Circadian rhythm sleep disorder, unspecified type: Secondary | ICD-10-CM

## 2020-12-22 DIAGNOSIS — G8929 Other chronic pain: Secondary | ICD-10-CM

## 2020-12-22 DIAGNOSIS — M545 Low back pain, unspecified: Secondary | ICD-10-CM

## 2020-12-22 NOTE — Progress Notes (Signed)
Chronic Care Management Pharmacy Note  12/22/2020 Name:  Christina Hayes MRN:  [id number] DOB:  October 05, 1950  Summary: primarily addressed insomnia. HLD, chronic pain.   Recommendations/Changes made from today's visit: None, pt finding OTC unisom to be mostly effective. No other changes.   Plan:  f/u with pharmacist in 6 months  Subjective: Christina Hayes is an 70 y.o. year old female who is a primary patient of Christina Nutting, DO.  The CCM team was consulted for assistance with disease management and care coordination needs.    Engaged with patient by telephone for follow up visit in response to provider referral for pharmacy case management and/or care coordination services.   Consent to Services:  The patient was given information about Chronic Care Management services, agreed to services, and gave verbal consent prior to initiation of services.  Please see initial visit note for detailed documentation.   Patient Care Team: Christina Nutting, DO as PCP - General (Family Medicine)   Objective:  Lab Results  Component Value Date   CREATININE 0.75 04/26/2020   CREATININE 0.82 10/14/2019   CREATININE 0.95 04/30/2019    Lab Results  Component Value Date   HGBA1C 5.8 08/09/2011        Component Value Date/Time   CHOL 152 10/14/2019 1202   CHOL 286 (H) 04/30/2019 0946   TRIG 115 10/14/2019 1202   HDL 68 10/14/2019 1202   HDL 63 04/30/2019 0946   CHOLHDL 2.2 10/14/2019 1202   VLDL 17 07/13/2015 1113   LDLCALC 64 10/14/2019 1202   LDLDIRECT 231.2 03/28/2010 0948    Hepatic Function Latest Ref Rng & Units 04/26/2020 10/14/2019 04/30/2019  Total Protein 6.5 - 8.1 g/dL 7.5 7.3 6.9  Albumin 3.5 - 5.0 g/dL 4.3 - 4.3  AST 15 - 41 U/L 24 27 59(H)  ALT 0 - 44 U/L 18 20 54(H)  Alk Phosphatase 38 - 126 U/L 94 - 181(H)  Total Bilirubin 0.3 - 1.2 mg/dL 0.4 0.6 0.3  Bilirubin, Direct 0.0 - 0.3 mg/dL - - -    Lab Results  Component Value Date/Time   TSH 2.770 04/30/2019 09:46 AM   TSH  1.71 03/12/2016 02:34 PM    CBC Latest Ref Rng & Units 04/30/2019 10/16/2017 06/11/2016  WBC 3.4 - 10.8 x10E3/uL 6.3 6.7 8.3  Hemoglobin 11.1 - 15.9 g/dL 12.2 12.6 12.7  Hematocrit 34.0 - 46.6 % 37.1 37.1 38.2  Platelets 150 - 450 x10E3/uL 372 376 442(H)    Lab Results  Component Value Date/Time   VD25OH 24.70 (L) 04/26/2020 04:30 PM    Social History   Tobacco Use  Smoking Status Never  Smokeless Tobacco Never   BP Readings from Last 3 Encounters:  10/13/20 117/74  09/22/20 130/78  07/19/20 123/75   Pulse Readings from Last 3 Encounters:  10/13/20 60  09/22/20 60  07/19/20 74   Wt Readings from Last 3 Encounters:  10/13/20 178 lb (80.7 kg)  09/22/20 178 lb 3.2 oz (80.8 kg)  07/19/20 170 lb (77.1 kg)    Assessment: Review of patient past medical history, allergies, medications, health status, including review of consultants reports, laboratory and other test data, was performed as part of comprehensive evaluation and provision of chronic care management services.   SDOH:  (Social Determinants of Health) assessments and interventions performed:    CCM Care Plan  Allergies  Allergen Reactions   Trazodone And Nefazodone Other (See Comments)    Nausea, fatique and sweating   Codeine Itching    *  Can take Hydrocodone*   Protriptyline Palpitations   Tramadol Other (See Comments)    headache   Vivactil [Protriptyline Hcl] Palpitations    Medications Reviewed Today     Reviewed by Christina Hayes, [facility] (Pharmacist) on 12/22/20 at 1326  Med List Status: <None>   Medication Order Taking? Sig Documenting Provider Last Dose Status Informant  AMBULATORY NON FORMULARY MEDICATION [id number] Yes Please provide cpap supplies: heated tubing, humidifier chamber, disposable filters, Mask/Mask frame/Head gear per patient preference Christina Nutting, DO Taking Active   Doxylamine Succinate, Sleep, (UNISOM SLEEPTABS PO) [id number] Yes Take 50 mg by mouth at bedtime. [provider] Taking Active   EPINEPHrine 0.3 mg/0.3 mL IJ SOAJ injection [id number] No Inject 0.3 mg into the muscle as needed for anaphylaxis.  Patient not taking: Reported on 10/13/2020   Christina Gain, MD Not Taking Active   ipratropium (ATROVENT) 0.03 % nasal spray [id number] Yes Place 2 sprays into both nostrils 2 (two) times daily as needed for rhinitis.  Patient taking differently: Place 2 sprays into both nostrils daily.   Christina Nutting, DO Taking Active   levocetirizine (XYZAL) 5 MG tablet [id number] Yes Take 1 tablet (5 mg total) by mouth every evening. Christina Gain, MD Taking Active   meloxicam [facility]) 7.5 MG tablet [id number] Yes Take 1 tablet (7.5 mg total) by mouth daily as needed for pain. Christina Nutting, DO Taking Active   montelukast (SINGULAIR) 10 MG tablet [id number] Yes Take 1 tablet (10 mg total) by mouth at bedtime. Christina Nutting, DO Taking Active   Multiple Vitamin (MULTIVITAMIN) tablet [id number] Yes Take 1 tablet by mouth daily. [provider] Taking Active   pantoprazole (PROTONIX) 40 MG tablet [id number] Yes Take 1 tablet (40 mg total) by mouth daily. Christina Nutting, DO Taking Active   rizatriptan (MAXALT) 5 MG tablet [id number] Yes Take 1 tablet (5 mg total) by mouth as needed for migraine. May repeat in 2 hours if needed Christina Nutting, DO Taking Active   rosuvastatin (CRESTOR) 20 MG tablet [id number] Yes Take 1 tablet (20 mg total) by mouth daily. Christina Nutting, DO Taking Active   tiZANidine (ZANAFLEX) 4 MG tablet [id number] Yes Take 4 mg by mouth every 6 (six) hours as needed for muscle spasms. [provider] Taking Active   triamcinolone cream (KENALOG) 0.1 % [id number] Yes Apply 1 application topically 2 (two) times daily. Christina Nutting, DO Taking Active             Patient Active Problem List   Diagnosis Date Noted   Osteoporosis 04/18/2020   Chronic rhinitis 12/28/2019   Osteoarthritis 10/18/2019   Weight Hayes 04/13/2019    GERD (gastroesophageal reflux disease) 04/13/2019   Insomnia 04/13/2019   DJD (degenerative joint disease) of knee 09/06/2015   Degenerative tear of medial meniscus 09/06/2015   Severe sleep apnea 07/21/2015   Osteopenia determined by x-ray 07/20/2015   Lumbago 06/15/2015   Left knee pain 06/10/2015   Carcinoid tumor 05/18/2014   Sleep pattern disturbance 01/26/2014   Obesity, unspecified 03/25/2012   HLD (hyperlipidemia) 12/03/2006   VERTIGO, HX OF 12/03/2006   Migraine 12/03/2006    Immunization History  Administered Date(s) Administered   Fluad Quad(high Dose 65+) 10/14/2019, 10/13/2020   Influenza, High Dose Seasonal PF 10/23/2016, 09/30/2018   Influenza,inj,Quad PF,6+ Mos 11/10/2015, 10/14/2017   Influenza-Unspecified 10/23/2016   PFIZER(Purple Top)SARS-COV-2 Vaccination 04/01/2019, 04/22/2019, 12/04/2019, 06/10/2020, 10/31/2020   Pneumococcal Conjugate-13 07/13/2015   Pneumococcal Polysaccharide-23 03/25/2012, 05/16/2017   Td 05/20/2003  Tdap 01/26/2014   Zoster, Live 04/25/2010    Conditions to be addressed/monitored: HLD and chronic pain, insomnia  Care Plan : Medication Management  Updates made by Christina Hayes, RPH since 12/22/2020 12:00 AM     Problem: HLD, chronic pain, insomnia      Long-Range Goal: Disease Progression Prevention   Start Date: 07/11/2020  Recent Progress: On track  Priority: High  Note:   Current Barriers:  None at present  Pharmacist Clinical Goal(s):  Over the next 120 days, patient will adhere to plan to optimize therapeutic regimen for insomnia as evidenced by report of adherence to recommended medication management changes through collaboration with PharmD and provider.   Interventions: 1:1 collaboration with Christina Nutting, DO regarding development and update of comprehensive plan of care as evidenced by provider attestation and co-signature Inter-disciplinary care team collaboration (see longitudinal plan of  care) Comprehensive medication review performed; medication list updated in electronic medical record  Hyperlipidemia:  Controlled; current treatment:rosuvastatin 34m daily;   Recommended continue current regimen  Chronic Pain (Ostoearthritis)  Controlled; current treatment: PRN meloxicam, PRN tizanidine, physical therapy; not currently taking gabapentin & doing well without it  Recommend: continue current regimen  Insomnia  Controlled; current treatment: unisom 580mnightly (failed melatonin but unisom working "mostly" well)  Recommend continue current regimen  Patient Goals/Self-Care Activities Over the next 180 days, patient will:  take medications as prescribed  Follow Up Plan: Telephone follow up appointment with care management team member scheduled for:  6 months      Medication Assistance: None required.  Patient affirms current coverage meets needs.  Patient's preferred pharmacy is:  [id number] [place] [facility] [address]one: [phone]: [phone] ([facility]one: [phone]: [phone] Specialty Hospitallivery ([facility]) - [address] [place] [id number]: [phone]: [phone] pill box? No - has a system in her drawer/container for AM & PM, works for her Pt endorses 80% compliance  Follow Up:  Patient agrees to Care Plan and Follow-up.  Plan: Telephone follow up appointment with care management team member scheduled for:  4 months  KeeLarinda ButteryharmD Clinical Pharmacist [phone]

## 2020-12-22 NOTE — Patient Instructions (Signed)
Visit Information  Thank you for taking time to visit with me today. Please don't hesitate to contact me if I can be of assistance to you before our next scheduled telephone appointment.  Following are the goals we discussed today:   Patient Goals/Self-Care Activities Over the next 180 days, patient will:  take medications as prescribed  Follow Up Plan: Telephone follow up appointment with care management team member scheduled for:  6 months  Please call the care guide team at [phone] if you need to cancel or reschedule your appointment.   Patient verbalizes understanding of instructions provided today and agrees to view in [place].   Christina Hayes

## 2020-12-29 ENCOUNTER — Ambulatory Visit (INDEPENDENT_AMBULATORY_CARE_PROVIDER_SITE_OTHER): Payer: Medicare Other

## 2020-12-29 DIAGNOSIS — J309 Allergic rhinitis, unspecified: Secondary | ICD-10-CM

## 2020-12-30 ENCOUNTER — Encounter: Payer: Self-pay | Admitting: Family Medicine

## 2021-01-06 ENCOUNTER — Ambulatory Visit (INDEPENDENT_AMBULATORY_CARE_PROVIDER_SITE_OTHER): Payer: Medicare Other

## 2021-01-06 DIAGNOSIS — J309 Allergic rhinitis, unspecified: Secondary | ICD-10-CM

## 2021-01-12 ENCOUNTER — Ambulatory Visit (INDEPENDENT_AMBULATORY_CARE_PROVIDER_SITE_OTHER): Payer: Medicare Other

## 2021-01-12 DIAGNOSIS — J309 Allergic rhinitis, unspecified: Secondary | ICD-10-CM | POA: Diagnosis not present

## 2021-01-19 ENCOUNTER — Ambulatory Visit (INDEPENDENT_AMBULATORY_CARE_PROVIDER_SITE_OTHER): Payer: Medicare Other | Admitting: *Deleted

## 2021-01-19 ENCOUNTER — Telehealth: Payer: Self-pay | Admitting: *Deleted

## 2021-01-19 DIAGNOSIS — J309 Allergic rhinitis, unspecified: Secondary | ICD-10-CM

## 2021-01-19 NOTE — Telephone Encounter (Signed)
Patient has been having issues with large local reactions while in her Red vials, I did discuss Epiwash with her and she was interested. Would this be ok to do for the patient?

## 2021-01-21 DIAGNOSIS — E782 Mixed hyperlipidemia: Secondary | ICD-10-CM | POA: Diagnosis not present

## 2021-01-24 NOTE — Telephone Encounter (Signed)
Patients allergy flowsheet has been updated to reflect these changes. Called patient and advised of change. Patient verbalized understanding.

## 2021-01-27 ENCOUNTER — Ambulatory Visit (INDEPENDENT_AMBULATORY_CARE_PROVIDER_SITE_OTHER): Payer: Medicare Other

## 2021-01-27 DIAGNOSIS — J309 Allergic rhinitis, unspecified: Secondary | ICD-10-CM

## 2021-01-30 DIAGNOSIS — J3089 Other allergic rhinitis: Secondary | ICD-10-CM | POA: Diagnosis not present

## 2021-01-30 NOTE — Progress Notes (Signed)
VIALS MADE. EXP 01-30-22

## 2021-01-31 DIAGNOSIS — J301 Allergic rhinitis due to pollen: Secondary | ICD-10-CM | POA: Diagnosis not present

## 2021-02-03 ENCOUNTER — Ambulatory Visit (INDEPENDENT_AMBULATORY_CARE_PROVIDER_SITE_OTHER): Payer: Medicare Other

## 2021-02-03 DIAGNOSIS — J309 Allergic rhinitis, unspecified: Secondary | ICD-10-CM

## 2021-02-10 ENCOUNTER — Ambulatory Visit (INDEPENDENT_AMBULATORY_CARE_PROVIDER_SITE_OTHER): Payer: Medicare Other

## 2021-02-10 DIAGNOSIS — J309 Allergic rhinitis, unspecified: Secondary | ICD-10-CM

## 2021-02-16 ENCOUNTER — Ambulatory Visit (INDEPENDENT_AMBULATORY_CARE_PROVIDER_SITE_OTHER): Payer: Medicare Other

## 2021-02-16 DIAGNOSIS — J309 Allergic rhinitis, unspecified: Secondary | ICD-10-CM

## 2021-02-22 ENCOUNTER — Ambulatory Visit (INDEPENDENT_AMBULATORY_CARE_PROVIDER_SITE_OTHER): Payer: Medicare Other | Admitting: *Deleted

## 2021-02-22 DIAGNOSIS — J309 Allergic rhinitis, unspecified: Secondary | ICD-10-CM

## 2021-03-02 ENCOUNTER — Ambulatory Visit (INDEPENDENT_AMBULATORY_CARE_PROVIDER_SITE_OTHER): Payer: Medicare Other

## 2021-03-02 DIAGNOSIS — J309 Allergic rhinitis, unspecified: Secondary | ICD-10-CM | POA: Diagnosis not present

## 2021-03-16 ENCOUNTER — Telehealth: Payer: Self-pay | Admitting: *Deleted

## 2021-03-16 ENCOUNTER — Ambulatory Visit (INDEPENDENT_AMBULATORY_CARE_PROVIDER_SITE_OTHER): Payer: Medicare Other | Admitting: *Deleted

## 2021-03-16 DIAGNOSIS — J309 Allergic rhinitis, unspecified: Secondary | ICD-10-CM | POA: Diagnosis not present

## 2021-03-16 NOTE — Telephone Encounter (Signed)
I called and left a detailed message about Dr. Jeralyn Ruths previous note. I also stated the patient could call back with any questions or concerns.

## 2021-03-16 NOTE — Telephone Encounter (Signed)
Patient came in for her injection and stated that when she was at the dentist they used lidocaine with epi and she started having cold chills and uncontrollable shakes, she has lidocaine in the past without issues, her dentist advised that she should let you know.

## 2021-03-24 ENCOUNTER — Ambulatory Visit (INDEPENDENT_AMBULATORY_CARE_PROVIDER_SITE_OTHER): Payer: Medicare Other

## 2021-03-24 DIAGNOSIS — J309 Allergic rhinitis, unspecified: Secondary | ICD-10-CM

## 2021-03-29 ENCOUNTER — Ambulatory Visit (INDEPENDENT_AMBULATORY_CARE_PROVIDER_SITE_OTHER): Payer: Medicare Other

## 2021-03-29 DIAGNOSIS — J309 Allergic rhinitis, unspecified: Secondary | ICD-10-CM | POA: Diagnosis not present

## 2021-04-07 ENCOUNTER — Ambulatory Visit (INDEPENDENT_AMBULATORY_CARE_PROVIDER_SITE_OTHER): Payer: Medicare Other | Admitting: *Deleted

## 2021-04-07 DIAGNOSIS — J309 Allergic rhinitis, unspecified: Secondary | ICD-10-CM

## 2021-04-12 ENCOUNTER — Ambulatory Visit: Payer: Medicare Other | Admitting: Family Medicine

## 2021-04-12 ENCOUNTER — Other Ambulatory Visit: Payer: Self-pay | Admitting: Family Medicine

## 2021-04-12 DIAGNOSIS — Z1231 Encounter for screening mammogram for malignant neoplasm of breast: Secondary | ICD-10-CM

## 2021-04-13 ENCOUNTER — Ambulatory Visit (INDEPENDENT_AMBULATORY_CARE_PROVIDER_SITE_OTHER): Payer: Medicare Other

## 2021-04-13 DIAGNOSIS — J309 Allergic rhinitis, unspecified: Secondary | ICD-10-CM

## 2021-04-20 ENCOUNTER — Ambulatory Visit (INDEPENDENT_AMBULATORY_CARE_PROVIDER_SITE_OTHER): Payer: Medicare Other | Admitting: Family Medicine

## 2021-04-20 ENCOUNTER — Encounter: Payer: Self-pay | Admitting: Family Medicine

## 2021-04-20 VITALS — BP 130/76 | HR 65 | Ht 61.5 in | Wt 193.0 lb

## 2021-04-20 DIAGNOSIS — G4720 Circadian rhythm sleep disorder, unspecified type: Secondary | ICD-10-CM | POA: Diagnosis not present

## 2021-04-20 DIAGNOSIS — R635 Abnormal weight gain: Secondary | ICD-10-CM

## 2021-04-20 DIAGNOSIS — G43009 Migraine without aura, not intractable, without status migrainosus: Secondary | ICD-10-CM

## 2021-04-20 DIAGNOSIS — E782 Mixed hyperlipidemia: Secondary | ICD-10-CM | POA: Diagnosis not present

## 2021-04-20 DIAGNOSIS — F5101 Primary insomnia: Secondary | ICD-10-CM | POA: Diagnosis not present

## 2021-04-20 DIAGNOSIS — E6609 Other obesity due to excess calories: Secondary | ICD-10-CM

## 2021-04-20 MED ORDER — SUMATRIPTAN SUCCINATE 50 MG PO TABS: 50.0000 mg | ORAL_TABLET | ORAL | 3 refills | Status: DC | PRN

## 2021-04-20 NOTE — Patient Instructions (Addendum)
Let me know if no improvement after about 1 month of sleep hygiene changes.  ?Try imitrex as needed for migraines.   This replaces maxalt.  ?Have labs completed.  We'll be intouch with results.  ?See me again in about 6 months.  ?

## 2021-04-21 ENCOUNTER — Ambulatory Visit (INDEPENDENT_AMBULATORY_CARE_PROVIDER_SITE_OTHER): Payer: Medicare Other

## 2021-04-21 DIAGNOSIS — J309 Allergic rhinitis, unspecified: Secondary | ICD-10-CM

## 2021-04-22 NOTE — Assessment & Plan Note (Signed)
We did discuss potentially adding GLP-1.  Updating labs today including A1c ?

## 2021-04-22 NOTE — Progress Notes (Signed)
?Christina Hayes - 71 y.o. female MRN [id number]  Date of birth: 11-11-1950 ? ?Subjective ?Chief Complaint  ?Patient presents with  ? Hyperlipidemia  ? ? ?HPI ?Christina Hayes is a 71 year old female here today for a follow-up visit.  Overall she is doing well. ? ?She is concerned about weight gain as well as sleeping difficulty.  She is previously on Ambien.  Most recently has been using Unisom.  She has not really made any changes to sleep hygiene regimen.  She is using CPAP regularly. ? ?She is tolerating rosuvastatin well at this time.  She would like to have updated labs ordered. ? ?She like to discuss additional options for management of her migraines.  She reports that Maxalt does work very well for her but is expensive.  She is asking for possible cheaper alternative. ? ?ROS:  A comprehensive ROS was completed and negative except as noted per HPI ? ?Allergies  ?Allergen Reactions  ? Trazodone And Nefazodone Other (See Comments)  ?  Nausea, fatique and sweating  ? Codeine Itching  ?  *Can take Hydrocodone*  ? Protriptyline Palpitations  ? Tramadol Other (See Comments)  ?  headache  ? Vivactil [Protriptyline Hcl] Palpitations  ? ? ?Past Medical History:  ?Diagnosis Date  ? Acid reflux   ? Allergic rhinitis, cause unspecified   ? Allergy   ? Anemia   ? years ago   ? Cataract   ? Complication of anesthesia SLOW TO WAKE  ? Cough   ? DJD (degenerative joint disease)   ? spine  ? Frequency of urination   ? H/O hiatal hernia   ? small  ? Head cold   ? Headache(784.0)   ? migraqine- uses essential oils  ? HYPERLIPIDEMIA, MIXED 12/03/2006  ? PT STATES NOT TAKING MED AS PRESCRIBED  ? MIGRAINES, HX OF 12/03/2006  ? Neuromuscular disorder ([place])   ? Nocturia   ? OSA on CPAP   ? Osteopenia   ? PONV (postoperative nausea and vomiting)   ? in past  ? Shortness of breath   ? Sleep apnea   ? cpap  ? SUI (stress urinary incontinence, female)   ? Urgency of urination   ? VERTIGO, HX OF 12/03/2006  ? ? ?Past Surgical History:   ?Procedure Laterality Date  ? COLONOSCOPY    ? PUBOVAGINAL SLING  04/30/2011  ? Procedure: Gaynelle Arabian;  Surgeon: Bernestine Amass, MD;  Location: [facility];  Service: Urology;  Laterality: N/A;  sub urethral sling ? ?  ?possible ower  ? RIGID BRONCHOSCOPY N/A 04/23/2013  ? Procedure: LASER BRONCHOSCOPY;  Surgeon: Melrose Nakayama, MD;  Location: [facility];  Service: Thoracic;  Laterality: N/A;  ? SALPINGOOPHORECTOMY  1995  ? BILATERAL  ? VAGINAL HYSTERECTOMY  1988  ? VIDEO BRONCHOSCOPY Bilateral 03/26/2013  ? Procedure: VIDEO BRONCHOSCOPY WITHOUT FLUORO;  Surgeon: Tanda Rockers, MD;  Location: [facility];  Service: Cardiopulmonary;  Laterality: Bilateral;  ? VIDEO BRONCHOSCOPY N/A 04/23/2013  ? Procedure: VIDEO BRONCHOSCOPY;  Surgeon: Melrose Nakayama, MD;  Location: [place];  Service: Thoracic;  Laterality: N/A;  ? VIDEO BRONCHOSCOPY N/A 05/28/2014  ? Procedure: VIDEO BRONCHOSCOPY;  Surgeon: Melrose Nakayama, MD;  Location: [place];  Service: Thoracic;  Laterality: N/A;  ? ? ?Social History  ? ?Socioeconomic History  ? Marital status: Married  ?  Spouse name: Alvester Chou  ? Number of children: 1  ? Years of education: 62  ? Highest education level: 12th  grade  ?Occupational History  ? Occupation: Retired  ?  Comment: Emergency planning/management officer  ?Tobacco Use  ? Smoking status: Never  ? Smokeless tobacco: Never  ?Vaping Use  ? Vaping Use: Never used  ?Substance and Sexual Activity  ? Alcohol use: No  ? Drug use: No  ? Sexual activity: Not Currently  ?  Partners: Male  ?  Birth control/protection: Surgical  ?Other Topics Concern  ? Not on file  ?Social History Narrative  ? HSG  ? Married 23-divorced, remarried '08  ? 1 daughter '79  ? 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 Regular exercise-no  ? Caffeine use: no  ? Gets on computer during the day. Checks emails. Plays games on computer, reads and shops online. Her hobbies include reading and art.  ? ?Social Determinants of Health  ? ?Financial Resource  Strain: Not on file  ?Food Insecurity: Not on file  ?Transportation Needs: Not on file  ?Physical Activity: Not on file  ?Stress: Not on file  ?Social Connections: Not on file  ? ? ?Family History  ?Problem Relation Age of Onset  ? Cancer Mother   ?     Colon Cancer survivor  ? Diabetes Mother   ? Colon cancer Mother   ? Diabetes Father   ? Hyperlipidemia Father   ? Alzheimer's disease Father   ? Cancer Maternal Grandmother   ?     Breast Cancer  ? Breast cancer Maternal Grandmother   ? Cancer Daughter 62  ?     stage III-B breast CA  ? Breast cancer Daughter   ? Colon polyps Brother   ? Allergies Daughter   ? Esophageal cancer Neg Hx   ? Rectal cancer Neg Hx   ? Stomach cancer Neg Hx   ? ? ?Health Maintenance  ?Topic Date Due  ? Zoster Vaccines- Shingrix (1 of 2) 09/22/2021 (Originally 02/27/1969)  ? INFLUENZA VACCINE  08/22/2021  ? MAMMOGRAM  05/17/2022  ? COLONOSCOPY (Pts 45-12yrs Insurance coverage will need to be confirmed)  03/06/2023  ? TETANUS/TDAP  01/27/2024  ? Pneumonia Vaccine 76+ Years old  Completed  ? DEXA SCAN  Completed  ? COVID-19 Vaccine  Completed  ? Hepatitis C Screening  Completed  ? HPV VACCINES  Aged Out  ? ? ? ?----------------------------------------------------------------------------------------------------------------------------------------------------------------------------------------------------------------- ?Physical Exam ?BP 130/76 (BP Location: Left Arm, Patient Position: Sitting, Cuff Size: Normal)   Pulse 65   Ht 5' 1.5" (1.562 m)   Wt 193 lb (87.5 kg)   SpO2 98%   BMI 35.88 kg/m?  ? ?Physical Exam ?Constitutional:   ?   Appearance: Normal appearance.  ?Eyes:  ?   General: No scleral icterus. ?Cardiovascular:  ?   Rate and Rhythm: Normal rate and regular rhythm.  ?Pulmonary:  ?   Effort: Pulmonary effort is normal.  ?   Breath sounds: Normal breath sounds.  ?Musculoskeletal:  ?   Cervical back: Neck supple.  ?Neurological:  ?   Mental Status: She is alert.  ?Psychiatric:      ?   Mood and Affect: Mood normal.     ?   Behavior: Behavior normal.  ? ? ?------------------------------------------------------------------------------------------------------------------------------------------------------------------------------------------------------------------- ?Assessment and Plan ? ?Migraine ?She is requesting cheaper alternatives for management of her migraines.  We will see if Imitrex is any cheaper than Maxalt.  Prescription sent in. ? ?Insomnia ?Printed handout on sleep hygiene and encouraged to work on this.  Continue Unisom as needed. ? ?Obesity, unspecified ?We did discuss potentially adding  GLP-1.  Updating labs today including A1c ? ?HLD (hyperlipidemia) ?She is tolerating rosuvastatin well.  Continue at current strength.  Updating lipid panel. ? ? ?Meds ordered this encounter  ?Medications  ? SUMAtriptan (IMITREX) 50 MG tablet  ?  Sig: Take 1 tablet (50 mg total) by mouth every 2 (two) hours as needed for migraine. May repeat in 2 hours if headache persists or recurs.  ?  Dispense:  10 tablet  ?  Refill:  3  ? ? ?Return in about 6 months (around 10/21/2021) for HLD/Sleep. ? ? ? ?This visit occurred during the SARS-CoV-2 public health emergency.  Safety protocols were in place, including screening questions prior to the visit, additional usage of staff PPE, and extensive cleaning of exam room while observing appropriate contact time as indicated for disinfecting solutions.  ? ?

## 2021-04-22 NOTE — Assessment & Plan Note (Signed)
She is tolerating rosuvastatin well.  Continue at current strength.  Updating lipid panel. ?

## 2021-04-22 NOTE — Assessment & Plan Note (Signed)
Printed handout on sleep hygiene and encouraged to work on this.  Continue Unisom as needed. ?

## 2021-04-22 NOTE — Assessment & Plan Note (Signed)
She is requesting cheaper alternatives for management of her migraines.  We will see if Imitrex is any cheaper than Maxalt.  Prescription sent in. ?

## 2021-04-26 DIAGNOSIS — E782 Mixed hyperlipidemia: Secondary | ICD-10-CM | POA: Diagnosis not present

## 2021-04-27 ENCOUNTER — Ambulatory Visit (INDEPENDENT_AMBULATORY_CARE_PROVIDER_SITE_OTHER): Payer: Medicare Other

## 2021-04-27 DIAGNOSIS — J309 Allergic rhinitis, unspecified: Secondary | ICD-10-CM | POA: Diagnosis not present

## 2021-05-02 LAB — CBC WITH DIFFERENTIAL/PLATELET
Basophils Absolute: 0.1 10*3/uL (ref 0.0–0.2)
Basos: 1 %
EOS (ABSOLUTE): 0.4 10*3/uL (ref 0.0–0.4)
Eos: 7 %
Hematocrit: 36.7 % (ref 34.0–46.6)
Hemoglobin: 12.1 g/dL (ref 11.1–15.9)
Immature Grans (Abs): 0 10*3/uL (ref 0.0–0.1)
Immature Granulocytes: 0 %
Lymphocytes Absolute: 2.2 10*3/uL (ref 0.7–3.1)
Lymphs: 34 %
MCH: 27.3 pg (ref 26.6–33.0)
MCHC: 33 g/dL (ref 31.5–35.7)
MCV: 83 fL (ref 79–97)
Monocytes Absolute: 0.4 10*3/uL (ref 0.1–0.9)
Monocytes: 7 %
Neutrophils Absolute: 3.3 10*3/uL (ref 1.4–7.0)
Neutrophils: 51 %
Platelets: 303 10*3/uL (ref 150–450)
RBC: 4.43 x10E6/uL (ref 3.77–5.28)
RDW: 12.9 % (ref 11.7–15.4)
WBC: 6.5 10*3/uL (ref 3.4–10.8)

## 2021-05-02 LAB — CMP14+EGFR
ALT: 24 IU/L (ref 0–32)
AST: 30 IU/L (ref 0–40)
Albumin/Globulin Ratio: 1.7 (ref 1.2–2.2)
Albumin: 4.3 g/dL (ref 3.7–4.7)
Alkaline Phosphatase: 117 IU/L (ref 44–121)
BUN/Creatinine Ratio: 16 (ref 12–28)
BUN: 12 mg/dL (ref 8–27)
Bilirubin Total: 0.3 mg/dL (ref 0.0–1.2)
CO2: 23 mmol/L (ref 20–29)
Calcium: 9.6 mg/dL (ref 8.7–10.3)
Chloride: 105 mmol/L (ref 96–106)
Creatinine, Ser: 0.76 mg/dL (ref 0.57–1.00)
Globulin, Total: 2.5 g/dL (ref 1.5–4.5)
Glucose: 103 mg/dL — ABNORMAL HIGH (ref 70–99)
Potassium: 4.1 mmol/L (ref 3.5–5.2)
Sodium: 141 mmol/L (ref 134–144)
Total Protein: 6.8 g/dL (ref 6.0–8.5)
eGFR: 83 mL/min/{1.73_m2} (ref >59)

## 2021-05-02 LAB — LIPID PANEL
Chol/HDL Ratio: 2.5 ratio (ref 0.0–4.4)
Cholesterol, Total: 186 mg/dL (ref 100–199)
HDL: 73 mg/dL (ref >39)
LDL Chol Calc (NIH): 88 mg/dL (ref 0–99)
Triglycerides: 149 mg/dL (ref 0–149)
VLDL Cholesterol Cal: 25 mg/dL (ref 5–40)

## 2021-05-02 LAB — HEMOGLOBIN A1C
Est. average glucose Bld gHb Est-mCnc: 126 mg/dL
Hgb A1c MFr Bld: 6 % — ABNORMAL HIGH (ref 4.8–5.6)

## 2021-05-09 ENCOUNTER — Other Ambulatory Visit: Payer: Self-pay | Admitting: Family Medicine

## 2021-05-10 ENCOUNTER — Ambulatory Visit (INDEPENDENT_AMBULATORY_CARE_PROVIDER_SITE_OTHER): Payer: Medicare Other

## 2021-05-10 DIAGNOSIS — J309 Allergic rhinitis, unspecified: Secondary | ICD-10-CM | POA: Diagnosis not present

## 2021-05-11 ENCOUNTER — Encounter: Payer: Self-pay | Admitting: Family Medicine

## 2021-05-17 ENCOUNTER — Ambulatory Visit (INDEPENDENT_AMBULATORY_CARE_PROVIDER_SITE_OTHER): Payer: Medicare Other | Admitting: *Deleted

## 2021-05-17 ENCOUNTER — Ambulatory Visit
Admission: RE | Admit: 2021-05-17 | Discharge: 2021-05-17 | Disposition: A | Discharge status: Home / Self Care | Payer: Medicare Other | Source: Ambulatory Visit | Attending: Family Medicine | Admitting: Family Medicine

## 2021-05-17 DIAGNOSIS — J309 Allergic rhinitis, unspecified: Secondary | ICD-10-CM

## 2021-05-17 DIAGNOSIS — Z1231 Encounter for screening mammogram for malignant neoplasm of breast: Secondary | ICD-10-CM | POA: Diagnosis not present

## 2021-05-26 ENCOUNTER — Ambulatory Visit (INDEPENDENT_AMBULATORY_CARE_PROVIDER_SITE_OTHER): Payer: Medicare Other

## 2021-05-26 DIAGNOSIS — J309 Allergic rhinitis, unspecified: Secondary | ICD-10-CM

## 2021-05-31 ENCOUNTER — Other Ambulatory Visit: Payer: Self-pay | Admitting: Thoracic Surgery (Cardiothoracic Vascular Surgery)

## 2021-05-31 DIAGNOSIS — D3A090 Benign carcinoid tumor of the bronchus and lung: Secondary | ICD-10-CM

## 2021-05-31 NOTE — Progress Notes (Unsigned)
[facility] ?

## 2021-06-01 ENCOUNTER — Ambulatory Visit (INDEPENDENT_AMBULATORY_CARE_PROVIDER_SITE_OTHER): Payer: Medicare Other

## 2021-06-01 DIAGNOSIS — J309 Allergic rhinitis, unspecified: Secondary | ICD-10-CM

## 2021-06-05 MED ORDER — PHENTERMINE HCL 30 MG PO CAPS: 30.0000 mg | ORAL_CAPSULE | ORAL | 0 refills | Status: DC

## 2021-06-07 ENCOUNTER — Ambulatory Visit (INDEPENDENT_AMBULATORY_CARE_PROVIDER_SITE_OTHER): Payer: Medicare Other | Admitting: Family Medicine

## 2021-06-07 DIAGNOSIS — Z0000 Encounter for general adult medical examination without abnormal findings: Secondary | ICD-10-CM | POA: Diagnosis not present

## 2021-06-07 NOTE — Patient Instructions (Addendum)
?MEDICARE ANNUAL WELLNESS VISIT ?Health Maintenance Summary and Written Plan of Care ? ?Christina Hayes , ? ?Thank you for allowing me to perform your Medicare Annual Wellness Visit and for your ongoing commitment to your health.  ? ?Health Maintenance & Immunization History ?Health Maintenance  ?Topic Date Due  ?? Zoster Vaccines- Shingrix (1 of 2) 09/22/2021 (Originally 02/27/1969)  ?? INFLUENZA VACCINE  08/22/2021  ?? COLONOSCOPY (Pts 45-47yrs Insurance coverage will need to be confirmed)  03/06/2023  ?? MAMMOGRAM  05/18/2023  ?? TETANUS/TDAP  01/27/2024  ?? Pneumonia Vaccine 15+ Years old  Completed  ?? DEXA SCAN  Completed  ?? COVID-19 Vaccine  Completed  ?? Hepatitis C Screening  Completed  ?? HPV VACCINES  Aged Out  ? ?Immunization History  ?Administered Date(s) Administered  ?? Fluad Quad(high Dose 65+) 10/14/2019, 10/13/2020  ?? Influenza, High Dose Seasonal PF 10/23/2016, 09/30/2018  ?? Influenza,inj,Quad PF,6+ Mos 11/10/2015, 10/14/2017  ?? Influenza-Unspecified 10/23/2016  ?? PFIZER(Purple Top)SARS-COV-2 Vaccination 04/01/2019, 04/22/2019, 12/04/2019, 06/10/2020  ?? Pension scheme manager 65yrs & up 10/31/2020  ?? Pneumococcal Conjugate-13 07/13/2015  ?? Pneumococcal Polysaccharide-23 03/25/2012, 05/16/2017  ?? Td 05/20/2003  ?? Tdap 01/26/2014  ?? Zoster, Live 04/25/2010  ? ? ?These are the patient goals that we discussed: ? Goals Addressed   ?  ?  ?  ?  ?  ? This Visit's Progress  ? ?  Patient Stated (pt-stated)     ?   Patient stated that she would like to loose 30 lbs. ?  ?  ?  ? ?This is a list of Health Maintenance Items that are overdue or due now: ?Shingrix vaccine ? ?Orders/Referrals Placed Today: ?No orders of the defined types were placed in this encounter. ? ?(Contact our referral department at [phone] if you have not spoken with someone about your referral appointment within the next 5 days)  ? ? ?Follow-up Plan ?Follow-up with [name], DO as planned ?Schedule your  shingrix vaccine at your pharmacy. ?Medicare wellness visit in one year. ?Patient will access AVS on my chart. ? ? ? ?  ?Health Maintenance, Female ?Adopting a healthy lifestyle and getting preventive care are important in promoting health and wellness. Ask your health care provider about: ?The right schedule for you to have regular tests and exams. ?Things you can do on your own to prevent diseases and keep yourself healthy. ?What should I know about diet, weight, and exercise? ?Eat a healthy diet ? ?Eat a diet that includes plenty of vegetables, fruits, low-fat dairy products, and lean protein. ?Do not eat a lot of foods that are high in solid fats, added sugars, or sodium. ?Maintain a healthy weight ?Body mass index (BMI) is used to identify weight problems. It estimates body fat based on height and weight. Your health care provider can help determine your BMI and help you achieve or maintain a healthy weight. ?Get regular exercise ?Get regular exercise. This is one of the most important things you can do for your health. Most adults should: ?Exercise for at least 150 minutes each week. The exercise should increase your heart rate and make you sweat (moderate-intensity exercise). ?Do strengthening exercises at least twice a week. This is in addition to the moderate-intensity exercise. ?Spend less time sitting. Even light physical activity can be beneficial. ?Watch cholesterol and blood lipids ?Have your blood tested for lipids and cholesterol at 72 years of age, then have this test every 5 years. ?Have your cholesterol levels checked more often if: ?Your  lipid or cholesterol levels are high. ?You are older than 72 years of age. ?You are at high risk for heart disease. ?What should I know about cancer screening? ?Depending on your health history and family history, you may need to have cancer screening at various ages. This may include screening for: ?Breast cancer. ?Cervical cancer. ?Colorectal cancer. ?Skin  cancer. ?Lung cancer. ?What should I know about heart disease, diabetes, and high blood pressure? ?Blood pressure and heart disease ?High blood pressure causes heart disease and increases the risk of stroke. This is more likely to develop in people who have high blood pressure readings or are overweight. ?Have your blood pressure checked: ?Every 3-5 years if you are 43-92 years of age. ?Every year if you are 14 years old or older. ?Diabetes ?Have regular diabetes screenings. This checks your fasting blood sugar level. Have the screening done: ?Once every three years after age 55 if you are at a normal weight and have a low risk for diabetes. ?More often and at a younger age if you are overweight or have a high risk for diabetes. ?What should I know about preventing infection? ?Hepatitis B ?If you have a higher risk for hepatitis B, you should be screened for this virus. Talk with your health care provider to find out if you are at risk for hepatitis B infection. ?Hepatitis C ?Testing is recommended for: ?Everyone born from [birth year] through 1966. ?Anyone with known risk factors for hepatitis C. ?Sexually transmitted infections (STIs) ?Get screened for STIs, including gonorrhea and chlamydia, if: ?You are sexually active and are younger than 72 years of age. ?You are older than 72 years of age and your health care provider tells you that you are at risk for this type of infection. ?Your sexual activity has changed since you were last screened, and you are at increased risk for chlamydia or gonorrhea. Ask your health care provider if you are at risk. ?Ask your health care provider about whether you are at high risk for HIV. Your health care provider may recommend a prescription medicine to help prevent HIV infection. If you choose to take medicine to prevent HIV, you should first get tested for HIV. You should then be tested every 3 months for as long as you are taking the medicine. ?Pregnancy ?If you are about to stop  having your period (premenopausal) and you may become pregnant, seek counseling before you get pregnant. ?Take 400 to 800 micrograms (mcg) of folic acid every day if you become pregnant. ?Ask for birth control (contraception) if you want to prevent pregnancy. ?Osteoporosis and menopause ?Osteoporosis is a disease in which the bones lose minerals and strength with aging. This can result in bone fractures. If you are 51 years old or older, or if you are at risk for osteoporosis and fractures, ask your health care provider if you should: ?Be screened for bone loss. ?Take a calcium or vitamin D supplement to lower your risk of fractures. ?Be given hormone replacement therapy (HRT) to treat symptoms of menopause. ?Follow these instructions at home: ?Alcohol use ?Do not drink alcohol if: ?Your health care provider tells you not to drink. ?You are pregnant, may be pregnant, or are planning to become pregnant. ?If you drink alcohol: ?Limit how much you have to: ?0-1 drink a day. ?Know how much alcohol is in your drink. In the [place] one drink equals one 12 oz bottle of beer (355 mL), one 5 oz glass of wine (148 mL), or one 1?  oz glass of hard liquor (44 mL). ?Lifestyle ?Do not use any products that contain nicotine or tobacco. These products include cigarettes, chewing tobacco, and vaping devices, such as e-cigarettes. If you need help quitting, ask your health care provider. ?Do not use street drugs. ?Do not share needles. ?Ask your health care provider for help if you need support or information about quitting drugs. ?General instructions ?Schedule regular health, dental, and eye exams. ?Stay current with your vaccines. ?Tell your health care provider if: ?You often feel depressed. ?You have ever been abused or do not feel safe at home. ?Summary ?Adopting a healthy lifestyle and getting preventive care are important in promoting health and wellness. ?Follow your health care provider's instructions about healthy diet,  exercising, and getting tested or screened for diseases. ?Follow your health care provider's instructions on monitoring your cholesterol and blood pressure. ?This information is not intended to replace advice given

## 2021-06-07 NOTE — Progress Notes (Signed)
? ? ?MEDICARE ANNUAL WELLNESS VISIT ? ?06/07/2021 ? ?Telephone Visit Disclaimer ?This Medicare AWV was conducted by telephone due to national recommendations for restrictions regarding the COVID-19 Pandemic (e.g. social distancing).  I verified, using two identifiers, that I am speaking with Christina Hayes or their authorized healthcare agent. I discussed the limitations, risks, security, and privacy concerns of performing an evaluation and management service by telephone and the potential availability of an in-person appointment in the future. The patient expressed understanding and agreed to proceed.  ?Location of Patient: Home ?Location of Provider (nurse):  In the office. ? ?Subjective:  ? ? ?Christina Hayes is a 71 y.o. female patient of Christina Nutting, DO who had a Medicare Annual Wellness Visit today via telephone. Donnie is Retired and lives with their spouse. she has 1 child. she reports that she is socially active and does interact with friends/family regularly. she is minimally physically active and enjoys reading and art. ? ?Patient Care Team: ?Christina Nutting, DO as PCP - General (Family Medicine) ? ? ?  06/07/2021  ? 11:20 AM 04/26/2020  ?  3:16 PM 03/21/2020  ?  2:29 PM 12/30/2018  ? 11:10 AM 12/24/2017  ? 11:04 AM 12/28/2015  ?  3:20 PM 07/13/2015  ? 10:43 AM  ?Advanced Directives  ?Does Patient Have a Medical Advance Directive? Yes Yes Yes Yes No Yes Yes  ?Type of Advance Directive Living will [place];Living will [place];Living will [place];Living will  [place];Living will Living will;Mental Health Advance Directive  ?Does patient want to make changes to medical advance directive? No - Patient declined  No - Patient declined No - Patient declined   No - Patient declined  ?Copy of [place] in Chart?   No - copy requested No - copy requested   No - copy requested  ?Would patient like information on creating a  medical advance directive?     No - Patient declined    ? ? ?Hospital Utilization Over the Past 12 Months: ?# of hospitalizations or ER visits: 0 ?# of surgeries: 0 ? ?Review of Systems    ?Patient reports that her overall health is better compared to last year. ? ?History obtained from chart review and the patient ? ?Patient Reported Readings (BP, Pulse, CBG, Weight, etc) ?none ? ?Pain Assessment ?Pain : No/denies pain ? ?  ? ?Current Medications & Allergies (verified) ?Allergies as of 06/07/2021   ? ?   Reactions  ? Trazodone And Nefazodone Other (See Comments)  ? Nausea, fatique and sweating  ? Codeine Itching  ? *Can take Hydrocodone*  ? Protriptyline Palpitations  ? Tramadol Other (See Comments)  ? headache  ? Vivactil [protriptyline Hcl] Palpitations  ? ?  ? ?  ?Medication List  ?  ? ?  ? Accurate as of Jun 07, 2021 11:25 AM. If you have any questions, ask your nurse or doctor.  ?  ?  ? ?  ? ?AMBULATORY NON FORMULARY MEDICATION ?Please provide cpap supplies: heated tubing, humidifier chamber, disposable filters, Mask/Mask frame/Head gear per patient preference ?  ?EPINEPHrine 0.3 mg/0.3 mL Soaj injection ?Commonly known as: EPI-PEN ?Inject 0.3 mg into the muscle as needed for anaphylaxis. ?  ?ipratropium 0.03 % nasal spray ?Commonly known as: ATROVENT ?Place 2 sprays into both nostrils 2 (two) times daily as needed for rhinitis. ?What changed: when to take this ?  ?levocetirizine 5 MG tablet ?Commonly known as:  XYZAL ?Take 1 tablet (5 mg total) by mouth every evening. ?  ?meloxicam 7.5 MG tablet ?Commonly known as: MOBIC ?TAKE 1 TABLET BY MOUTH  DAILY AS NEEDED FOR PAIN ?  ?montelukast 10 MG tablet ?Commonly known as: SINGULAIR ?Take 1 tablet (10 mg total) by mouth at bedtime. ?  ?multivitamin tablet ?Take 1 tablet by mouth daily. ?  ?pantoprazole 40 MG tablet ?Commonly known as: PROTONIX ?Take 1 tablet (40 mg total) by mouth daily. ?  ?phentermine 30 MG capsule ?Take 1 capsule (30 mg total) by mouth every  morning. ?  ?rosuvastatin 20 MG tablet ?Commonly known as: CRESTOR ?Take 1 tablet (20 mg total) by mouth daily. ?  ?SUMAtriptan 50 MG tablet ?Commonly known as: Imitrex ?Take 1 tablet (50 mg total) by mouth every 2 (two) hours as needed for migraine. May repeat in 2 hours if headache persists or recurs. ?  ?tiZANidine 4 MG tablet ?Commonly known as: ZANAFLEX ?Take 4 mg by mouth every 6 (six) hours as needed for muscle spasms. ?  ?triamcinolone cream 0.1 % ?Commonly known as: KENALOG ?Apply 1 application topically 2 (two) times daily. ?  ?UNISOM SLEEPTABS PO ?Take 50 mg by mouth at bedtime. ?  ? ?  ? ? ?History (reviewed): ?Past Medical History:  ?Diagnosis Date  ? Acid reflux   ? Allergic rhinitis, cause unspecified   ? Allergy   ? Anemia   ? years ago   ? Cataract   ? Complication of anesthesia SLOW TO WAKE  ? Cough   ? DJD (degenerative joint disease)   ? spine  ? Frequency of urination   ? H/O hiatal hernia   ? small  ? Head cold   ? Headache(784.0)   ? migraqine- uses essential oils  ? HYPERLIPIDEMIA, MIXED 12/03/2006  ? PT STATES NOT TAKING MED AS PRESCRIBED  ? MIGRAINES, HX OF 12/03/2006  ? Neuromuscular disorder ([place])   ? Nocturia   ? OSA on CPAP   ? Osteopenia   ? PONV (postoperative nausea and vomiting)   ? in past  ? Shortness of breath   ? Sleep apnea   ? cpap  ? SUI (stress urinary incontinence, female)   ? Urgency of urination   ? VERTIGO, HX OF 12/03/2006  ? ?Past Surgical History:  ?Procedure Laterality Date  ? COLONOSCOPY    ? PUBOVAGINAL SLING  04/30/2011  ? Procedure: Gaynelle Arabian;  Surgeon: Bernestine Amass, MD;  Location: [facility];  Service: Urology;  Laterality: N/A;  sub urethral sling ? ?  ?possible ower  ? RIGID BRONCHOSCOPY N/A 04/23/2013  ? Procedure: LASER BRONCHOSCOPY;  Surgeon: Melrose Nakayama, MD;  Location: [facility];  Service: Thoracic;  Laterality: N/A;  ? SALPINGOOPHORECTOMY  1995  ? BILATERAL  ? VAGINAL HYSTERECTOMY  1988  ? VIDEO BRONCHOSCOPY Bilateral  03/26/2013  ? Procedure: VIDEO BRONCHOSCOPY WITHOUT FLUORO;  Surgeon: Tanda Rockers, MD;  Location: [facility];  Service: Cardiopulmonary;  Laterality: Bilateral;  ? VIDEO BRONCHOSCOPY N/A 04/23/2013  ? Procedure: VIDEO BRONCHOSCOPY;  Surgeon: Melrose Nakayama, MD;  Location: [facility];  Service: Thoracic;  Laterality: N/A;  ? VIDEO BRONCHOSCOPY N/A 05/28/2014  ? Procedure: VIDEO BRONCHOSCOPY;  Surgeon: Melrose Nakayama, MD;  Location: [facility];  Service: Thoracic;  Laterality: N/A;  ? ?Family History  ?Problem Relation Age of Onset  ? Cancer Mother   ?     Colon Cancer survivor  ? Diabetes Mother   ? Colon cancer Mother   ?  Diabetes Father   ? Hyperlipidemia Father   ? Alzheimer's disease Father   ? Cancer Maternal Grandmother   ?     Breast Cancer  ? Breast cancer Maternal Grandmother   ? Cancer Daughter   ?     stage III-B breast CA  ? Breast cancer Daughter   ? Colon polyps Brother   ? Allergies Daughter   ? Esophageal cancer Neg Hx   ? Rectal cancer Neg Hx   ? Stomach cancer Neg Hx   ? ?Social History  ? ?Socioeconomic History  ? Marital status: Married  ?  Spouse name: Alvester Chou  ? Number of children: 1  ? Years of education: 73  ? Highest education level: 12th grade  ?Occupational History  ? Occupation: Retired  ?  Comment: Emergency planning/management officer  ?Tobacco Use  ? Smoking status: Never  ? Smokeless tobacco: Never  ?Vaping Use  ? Vaping Use: Never used  ?Substance and Sexual Activity  ? Alcohol use: No  ? Drug use: No  ? Sexual activity: Not Currently  ?  Partners: Male  ?  Birth control/protection: Surgical  ?Other Topics Concern  ? Not on file  ?Social History Narrative  ? Lives with her husband. She has one child. She enjoys reading and art.  ? ?Social Determinants of Health  ? ?Financial Resource Strain: Low Risk   ? Difficulty of Paying Living Expenses: Not hard at all  ?Food Insecurity: No Food Insecurity  ? Worried About Charity fundraiser in the Last Year: Never true  ? Ran Out of Food in the Last Year:  Never true  ?Transportation Needs: No Transportation Needs  ? Lack of Transportation (Medical): No  ? Lack of Transportation (Non-Medical): No  ?Physical Activity: Inactive  ? Days of Exercise per Week: 0 days

## 2021-06-08 ENCOUNTER — Ambulatory Visit (INDEPENDENT_AMBULATORY_CARE_PROVIDER_SITE_OTHER): Payer: Medicare Other

## 2021-06-08 DIAGNOSIS — J309 Allergic rhinitis, unspecified: Secondary | ICD-10-CM | POA: Diagnosis not present

## 2021-06-16 ENCOUNTER — Ambulatory Visit (INDEPENDENT_AMBULATORY_CARE_PROVIDER_SITE_OTHER): Payer: Medicare Other

## 2021-06-16 DIAGNOSIS — J309 Allergic rhinitis, unspecified: Secondary | ICD-10-CM

## 2021-06-20 ENCOUNTER — Encounter: Payer: Self-pay | Admitting: Allergy

## 2021-06-20 MED ORDER — LEVOCETIRIZINE DIHYDROCHLORIDE 5 MG PO TABS: 5.0000 mg | ORAL_TABLET | Freq: Every evening | ORAL | 0 refills | Status: DC

## 2021-06-21 DIAGNOSIS — J3089 Other allergic rhinitis: Secondary | ICD-10-CM | POA: Diagnosis not present

## 2021-06-21 NOTE — Progress Notes (Signed)
VIALS EXP 06-22-22

## 2021-06-22 DIAGNOSIS — J301 Allergic rhinitis due to pollen: Secondary | ICD-10-CM | POA: Diagnosis not present

## 2021-06-23 ENCOUNTER — Ambulatory Visit (INDEPENDENT_AMBULATORY_CARE_PROVIDER_SITE_OTHER): Payer: Medicare Other | Admitting: *Deleted

## 2021-06-23 DIAGNOSIS — J309 Allergic rhinitis, unspecified: Secondary | ICD-10-CM

## 2021-06-29 ENCOUNTER — Other Ambulatory Visit: Payer: Self-pay | Admitting: Family Medicine

## 2021-06-29 DIAGNOSIS — K219 Gastro-esophageal reflux disease without esophagitis: Secondary | ICD-10-CM

## 2021-06-30 ENCOUNTER — Ambulatory Visit (INDEPENDENT_AMBULATORY_CARE_PROVIDER_SITE_OTHER): Payer: Medicare Other

## 2021-06-30 ENCOUNTER — Ambulatory Visit
Admission: RE | Admit: 2021-06-30 | Discharge: 2021-06-30 | Disposition: A | Discharge status: Home / Self Care | Payer: Medicare Other | Source: Ambulatory Visit | Attending: Thoracic Surgery (Cardiothoracic Vascular Surgery) | Admitting: Thoracic Surgery (Cardiothoracic Vascular Surgery)

## 2021-06-30 DIAGNOSIS — D3A090 Benign carcinoid tumor of the bronchus and lung: Secondary | ICD-10-CM

## 2021-06-30 DIAGNOSIS — I700 Atherosclerosis of aorta: Secondary | ICD-10-CM | POA: Diagnosis not present

## 2021-06-30 DIAGNOSIS — R918 Other nonspecific abnormal finding of lung field: Secondary | ICD-10-CM | POA: Diagnosis not present

## 2021-06-30 DIAGNOSIS — J309 Allergic rhinitis, unspecified: Secondary | ICD-10-CM | POA: Diagnosis not present

## 2021-06-30 DIAGNOSIS — R911 Solitary pulmonary nodule: Secondary | ICD-10-CM | POA: Diagnosis not present

## 2021-07-02 ENCOUNTER — Other Ambulatory Visit: Payer: Self-pay | Admitting: Family Medicine

## 2021-07-02 DIAGNOSIS — K219 Gastro-esophageal reflux disease without esophagitis: Secondary | ICD-10-CM

## 2021-07-04 ENCOUNTER — Ambulatory Visit: Payer: Medicare Other | Admitting: Thoracic Surgery (Cardiothoracic Vascular Surgery)

## 2021-07-04 VITALS — BP 112/73 | HR 94 | Resp 20 | Ht 62.0 in | Wt 190.0 lb

## 2021-07-04 DIAGNOSIS — C7A090 Malignant carcinoid tumor of the bronchus and lung: Secondary | ICD-10-CM | POA: Diagnosis not present

## 2021-07-04 DIAGNOSIS — R918 Other nonspecific abnormal finding of lung field: Secondary | ICD-10-CM | POA: Diagnosis not present

## 2021-07-04 NOTE — Progress Notes (Signed)
[address]       [place] [address]             [phone]     HPI: Ms.Pacholski returns for a scheduled follow-up visit regarding an endobronchial carcinoid tumor and lung nodules.  Christina Hayes is a 71 year old woman with a history of hyperlipidemia, obstructive sleep apnea, migraines, hiatal hernia, reflux, and carcinoid tumor of the bronchus.  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  She has been followed since then.  She has had some small lung nodules but those have remained stable over time.  I last saw her a year ago.  She was doing well at that time and no evidence of recurrent disease.  In the interim since that visit she has been feeling well.  She has not had any respiratory issues.  No unusual cough.  Past Medical History:  Diagnosis Date   Acid reflux    Allergic rhinitis, cause unspecified    Allergy    Anemia    years ago    Cataract    Complication of anesthesia SLOW TO WAKE   Cough    DJD (degenerative joint disease)    spine   Frequency of urination    H/O hiatal hernia    small   Head cold    Headache(784.0)    migraqine- uses essential oils   HYPERLIPIDEMIA, MIXED 12/03/2006   PT STATES NOT TAKING MED AS PRESCRIBED   MIGRAINES, HX OF 12/03/2006   Neuromuscular disorder ([facility])    Nocturia    OSA on CPAP    Osteopenia    PONV (postoperative nausea and vomiting)    in past   Shortness of breath    Sleep apnea    cpap   SUI (stress urinary incontinence, female)    Urgency of urination    VERTIGO, HX OF 12/03/2006     Current Outpatient Medications  Medication Sig Dispense Refill   AMBULATORY NON FORMULARY MEDICATION Please provide cpap supplies: heated tubing, humidifier chamber, disposable filters, Mask/Mask frame/Head gear per patient preference 1 Units 0   Doxylamine Succinate, Sleep, (UNISOM SLEEPTABS PO) Take 50 mg by mouth at bedtime.      EPINEPHrine 0.3 mg/0.3 mL IJ SOAJ injection Inject 0.3 mg into the muscle as needed for anaphylaxis. 1 each 1   ipratropium (ATROVENT) 0.03 % nasal spray Place 2 sprays into both nostrils 2 (two) times daily as needed for rhinitis. (Patient taking differently: Place 2 sprays into both nostrils daily.) 90 mL 3   levocetirizine (XYZAL) 5 MG tablet Take 1 tablet (5 mg total) by mouth every evening. 90 tablet 0   meloxicam (MOBIC) 7.5 MG tablet TAKE 1 TABLET BY MOUTH  DAILY AS NEEDED FOR PAIN 90 tablet 3   montelukast (SINGULAIR) 10 MG tablet Take 1 tablet (10 mg total) by mouth at bedtime. 90 tablet 2   Multiple Vitamin (MULTIVITAMIN) tablet Take 1 tablet by mouth daily.     pantoprazole (PROTONIX) 40 MG tablet TAKE 1 TABLET BY MOUTH  DAILY 100 tablet 2   phentermine 30 MG capsule Take 1 capsule (30 mg total) by mouth every morning. 90 capsule 0   rosuvastatin (CRESTOR) 20 MG tablet Take 1 tablet (20 mg total) by mouth daily. 90 tablet 2   SUMAtriptan (IMITREX) 50 MG tablet Take 1 tablet (50 mg total) by mouth every 2 (two) hours as needed  for migraine. May repeat in 2 hours if headache persists or recurs. 10 tablet 3   tiZANidine (ZANAFLEX) 4 MG tablet Take 4 mg by mouth every 6 (six) hours as needed for muscle spasms.     triamcinolone cream (KENALOG) 0.1 % Apply 1 application topically 2 (two) times daily. 453.6 g 3   No current facility-administered medications for this visit.    Physical Exam BP 112/73   Pulse 94   Resp 20   Ht 5\' 2"  (1.575 m)   Wt 190 lb (86.2 kg)   SpO2 96% Comment: RA  BMI 34.54 kg/m  71 year old woman in no acute distress Alert and oriented x3 with no focal deficits Lungs clear with equal breath sounds bilaterally Cardiac regular rate and rhythm  Diagnostic Tests: CT CHEST WITHOUT CONTRAST   TECHNIQUE: Multidetector CT imaging of the chest was performed following the standard protocol without IV contrast.   RADIATION DOSE REDUCTION: This exam was performed  according to the departmental dose-optimization program which includes automated exposure control, adjustment of the mA and/or kV according to patient size and/or use of iterative reconstruction technique.   COMPARISON:  07/14/2020, 07/08/2018   FINDINGS: Cardiovascular: Aortic atherosclerosis. Normal heart size. Three-vessel coronary artery calcifications. No pericardial effusion.   Mediastinum/Nodes: No enlarged mediastinal, hilar, or axillary lymph nodes. Small hiatal hernia. Thyroid gland, trachea, and esophagus demonstrate no significant findings.   Lungs/Pleura: Occasional small bilateral pulmonary nodules are unchanged, measuring 0.5 cm and smaller, for example in the posterior right upper lobe (series 8, image 40) and in the dependent left lower lobe (series 8, image 66). No pleural effusion or pneumothorax.   Upper Abdomen: No acute abnormality.  Hepatic steatosis.   Musculoskeletal: No chest wall abnormality. No suspicious osseous lesions identified.   IMPRESSION: 1. Occasional small bilateral pulmonary nodules are unchanged, measuring 0.5 cm and smaller, for example in the posterior right upper lobe and in the dependent left lower lobe. These have been stable over multiple prior examinations dating back to 07/08/2018 and are most likely benign sequelae of infection or inflammation. 2. Coronary artery disease. 3. Small hiatal hernia. 4. Hepatic steatosis.   Aortic Atherosclerosis (ICD10-I70.0).     Electronically Signed   By: Delanna Ahmadi M.D.   On: 06/30/2021 15:04 I personally reviewed the CT images.  No evidence of recurrence at the site of the endobronchial carcinoid in the left mainstem bronchus.  Stable small lung nodules.  Impression: Christina Hayes is a 71 year old woman with a history of hyperlipidemia, obstructive sleep apnea, migraines, hiatal hernia, reflux, and carcinoid tumor of the bronchus.  Endobronchial carcinoid tumor-endobronchial resection  and laser ablation of a endobronchial carcinoid in 2015.  Now 8 years out with no evidence of recurrent disease.  We will repeat a scan in about 2 years for 10-year follow-up.  Lung nodules-small lung nodules.  Some concern for potential carcinoid tumors given her history.  However has been stable over several years.  We will check those on a repeat scan in 2 years.  Plan: Return in 2 years with CT chest  Melrose Nakayama, MD [facility] [phone]

## 2021-07-06 DIAGNOSIS — L821 Other seborrheic keratosis: Secondary | ICD-10-CM | POA: Diagnosis not present

## 2021-07-06 DIAGNOSIS — Q825 Congenital non-neoplastic nevus: Secondary | ICD-10-CM | POA: Diagnosis not present

## 2021-07-06 DIAGNOSIS — L298 Other pruritus: Secondary | ICD-10-CM | POA: Diagnosis not present

## 2021-07-06 DIAGNOSIS — L720 Epidermal cyst: Secondary | ICD-10-CM | POA: Diagnosis not present

## 2021-07-06 DIAGNOSIS — D1801 Hemangioma of skin and subcutaneous tissue: Secondary | ICD-10-CM | POA: Diagnosis not present

## 2021-07-06 DIAGNOSIS — L819 Disorder of pigmentation, unspecified: Secondary | ICD-10-CM | POA: Diagnosis not present

## 2021-07-07 ENCOUNTER — Ambulatory Visit (INDEPENDENT_AMBULATORY_CARE_PROVIDER_SITE_OTHER): Payer: Medicare Other | Admitting: *Deleted

## 2021-07-07 DIAGNOSIS — J309 Allergic rhinitis, unspecified: Secondary | ICD-10-CM | POA: Diagnosis not present

## 2021-07-13 ENCOUNTER — Ambulatory Visit (INDEPENDENT_AMBULATORY_CARE_PROVIDER_SITE_OTHER): Payer: Medicare Other

## 2021-07-13 DIAGNOSIS — J309 Allergic rhinitis, unspecified: Secondary | ICD-10-CM

## 2021-07-20 ENCOUNTER — Ambulatory Visit (INDEPENDENT_AMBULATORY_CARE_PROVIDER_SITE_OTHER): Payer: Medicare Other

## 2021-07-20 DIAGNOSIS — J309 Allergic rhinitis, unspecified: Secondary | ICD-10-CM | POA: Diagnosis not present

## 2021-07-28 ENCOUNTER — Encounter: Payer: Self-pay | Admitting: Allergy

## 2021-07-28 ENCOUNTER — Ambulatory Visit: Payer: Medicare Other | Admitting: Allergy

## 2021-07-28 VITALS — BP 116/74 | HR 80 | Temp 97.8°F | Resp 16 | Ht 62.0 in | Wt 182.2 lb

## 2021-07-28 DIAGNOSIS — J309 Allergic rhinitis, unspecified: Secondary | ICD-10-CM | POA: Diagnosis not present

## 2021-07-28 DIAGNOSIS — H1013 Acute atopic conjunctivitis, bilateral: Secondary | ICD-10-CM

## 2021-07-28 DIAGNOSIS — J3089 Other allergic rhinitis: Secondary | ICD-10-CM

## 2021-07-28 DIAGNOSIS — H1045 Other chronic allergic conjunctivitis: Secondary | ICD-10-CM

## 2021-07-28 MED ORDER — MONTELUKAST SODIUM 10 MG PO TABS: 10.0000 mg | ORAL_TABLET | Freq: Every day | ORAL | 2 refills | Status: DC

## 2021-07-28 MED ORDER — LEVOCETIRIZINE DIHYDROCHLORIDE 5 MG PO TABS
5.0000 mg | ORAL_TABLET | Freq: Every evening | ORAL | 2 refills | Status: DC
Start: 2021-07-28 — End: 2021-11-10

## 2021-07-28 NOTE — Patient Instructions (Signed)
Allergic rhinitis with conjunctivitis with postnasal drip - doing well on allergen immunotherapy  - Continue avoidance measures for grass pollen, weed pollen, tree pollen, mold, dust mite - Continue singular 10 mg nightly - Continue Xyzal 5 mg daily.   Once you move to every other week injections you can try to do Xyzal every other day and perform a slow wean to as needed use.  - Can use nasal Atrovent 2 sprays each nostril 3-4 times a day for max dosing for nasal drainage control -for watery eyes can use over-the-counter Pataday 1 drop each eye daily as needed -Continue allergen immunotherapy per protocol and have access to your epinephrine device  Follow-up in 12 months or sooner if needed

## 2021-07-28 NOTE — Progress Notes (Signed)
Follow-up Note  RE: Christina Hayes MRN: [id number] DOB: 01-27-1950 Date of Office Visit: 07/28/2021   History of present illness: Christina Hayes is a 71 y.o. female presenting today for follow-up of allergic rhinitis with conjunctivitis.  She was last seen in the office on 09/22/2020 by myself.  She states this spring has been well for her in regards to her allergies.  She has not noted any increase in allergy symptoms at all.  She continues to take Singulair and Xyzal daily.  She did use nasal Atrovent for the winter when she had an upper respiratory illness.  She has not needed to use any allergy based eyedrops.  She continues on allergen immunotherapy at weekly injections.  She states she noted right away improvement in her allergy symptoms after starting on immunotherapy.  She has had had some large local reactions with her immunotherapy but has been doing better with the epinephrine washes.  She continues on weekly injections at this time.  Review of systems: Review of Systems  Constitutional: Negative.   HENT: Negative.    Eyes: Negative.   Respiratory: Negative.    Cardiovascular: Negative.   Gastrointestinal: Negative.   Musculoskeletal: Negative.   Skin: Negative.   Allergic/Immunologic: Negative.   Neurological: Negative.      All other systems negative unless noted above in HPI  Past medical/social/surgical/family history have been reviewed and are unchanged unless specifically indicated below.  No changes  Medication List: Current Outpatient Medications  Medication Sig Dispense Refill   AMBULATORY NON FORMULARY MEDICATION Please provide cpap supplies: heated tubing, humidifier chamber, disposable filters, Mask/Mask frame/Head gear per patient preference 1 Units 0   Doxylamine Succinate, Sleep, (UNISOM SLEEPTABS PO) Take 50 mg by mouth at bedtime.     EPINEPHrine 0.3 mg/0.3 mL IJ SOAJ injection Inject 0.3 mg into the muscle as needed for anaphylaxis. 1 each 1    ipratropium (ATROVENT) 0.03 % nasal spray Place 2 sprays into both nostrils 2 (two) times daily as needed for rhinitis. (Patient taking differently: Place 2 sprays into both nostrils daily.) 90 mL 3   meloxicam (MOBIC) 7.5 MG tablet TAKE 1 TABLET BY MOUTH  DAILY AS NEEDED FOR PAIN 90 tablet 3   Multiple Vitamin (MULTIVITAMIN) tablet Take 1 tablet by mouth daily.     pantoprazole (PROTONIX) 40 MG tablet TAKE 1 TABLET BY MOUTH  DAILY 100 tablet 2   phentermine 30 MG capsule Take 1 capsule (30 mg total) by mouth every morning. 90 capsule 0   rosuvastatin (CRESTOR) 20 MG tablet Take 1 tablet (20 mg total) by mouth daily. 90 tablet 2   SUMAtriptan (IMITREX) 50 MG tablet Take 1 tablet (50 mg total) by mouth every 2 (two) hours as needed for migraine. May repeat in 2 hours if headache persists or recurs. 10 tablet 3   tiZANidine (ZANAFLEX) 4 MG tablet Take 4 mg by mouth every 6 (six) hours as needed for muscle spasms.     triamcinolone cream (KENALOG) 0.1 % Apply 1 application topically 2 (two) times daily. 453.6 g 3   levocetirizine (XYZAL) 5 MG tablet Take 1 tablet (5 mg total) by mouth every evening. 90 tablet 2   montelukast (SINGULAIR) 10 MG tablet Take 1 tablet (10 mg total) by mouth at bedtime. 90 tablet 2   No current facility-administered medications for this visit.     Known medication allergies: Allergies  Allergen Reactions   Trazodone And Nefazodone Other (See Comments)    Nausea,  fatique and sweating   Codeine Itching    *Can take Hydrocodone*   Protriptyline Palpitations   Tramadol Other (See Comments)    headache   Vivactil [Protriptyline Hcl] Palpitations     Physical examination: Blood pressure 116/74, pulse 80, temperature 97.8 F (36.6 C), resp. rate 16, height 5\' 2"  (1.575 m), weight 182 lb 4 oz (82.7 kg), SpO2 96 %.  General: Alert, interactive, in no acute distress. HEENT: PERRLA, TMs pearly gray, turbinates non-edematous without discharge, post-pharynx non  erythematous. Neck: Supple without lymphadenopathy. Lungs: Clear to auscultation without wheezing, rhonchi or rales. {no increased work of breathing. CV: Normal S1, S2 without murmurs. Abdomen: Nondistended, nontender. Skin: Warm and dry, without lesions or rashes. Extremities:  No clubbing, cyanosis or edema. Neuro:   Grossly intact.  Diagnositics/Labs: Immunotherapy injections provided today in both arms  Assessment and plan:  Allergic rhinitis with conjunctivitis - doing well on allergen immunotherapy  - Continue avoidance measures for grass pollen, weed pollen, tree pollen, mold, dust mite - Continue singular 10 mg nightly - Continue Xyzal 5 mg daily.   Once you move to every other week injections you can try to do Xyzal every other day and perform a slow wean to as needed use.  - Can use nasal Atrovent 2 sprays each nostril 3-4 times a day for max dosing for nasal drainage control -for watery eyes can use over-the-counter Pataday 1 drop each eye daily as needed -Continue allergen immunotherapy per protocol and have access to your epinephrine device  Follow-up in 12 months or sooner if needed  I appreciate the opportunity to take part in Christina Hayes's care. Please do not hesitate to contact me with questions.  Sincerely,   Prudy Feeler, MD Allergy/Immunology [facility]

## 2021-08-03 ENCOUNTER — Ambulatory Visit (INDEPENDENT_AMBULATORY_CARE_PROVIDER_SITE_OTHER): Payer: Medicare Other | Admitting: Pharmacist

## 2021-08-03 DIAGNOSIS — F5101 Primary insomnia: Secondary | ICD-10-CM

## 2021-08-03 DIAGNOSIS — E782 Mixed hyperlipidemia: Secondary | ICD-10-CM

## 2021-08-03 DIAGNOSIS — R635 Abnormal weight gain: Secondary | ICD-10-CM

## 2021-08-03 NOTE — [place] Notes (Signed)
Chronic Care Management Pharmacy Note  08/04/2021 Name:  Christina Hayes MRN:  [id number] DOB:  11-05-1950  Summary: primarily addressed insomnia. HLD, chronic pain.  Patient expressed desire to work on weight loss. Using phentermine due to GLP1 therapy being cost prohibitive.  Baseline weight 200lb, currently at 180-182lb. Not currently active physically, but previously did water aerobics. Aiming for fresher fruits or vegetables now that it is summer time.   Recommendations/Changes made from today's visit:  - Counseled patient on lifestyle modifications for both weight management and sleep hygiene - No medication changes, however will investigate pre-diabetes and patient assistance options for cost coverage of ozempic .  Plan:  f/u with pharmacist in 1 month  Subjective: Christina Hayes is an 71 y.o. year old female who is a primary patient of [name], DO.  The CCM team was consulted for assistance with disease management and care coordination needs.    Engaged with patient by telephone for follow up visit in response to provider referral for pharmacy case management and/or care coordination services.   Consent to Services:  The patient was given information about Chronic Care Management services, agreed to services, and gave verbal consent prior to initiation of services.  Please see initial visit note for detailed documentation.   Patient Care Team: [name], DO as PCP - General (Family Medicine)   Objective:  Lab Results  Component Value Date   CREATININE 0.76 04/26/2021   CREATININE 0.75 04/26/2020   CREATININE 0.82 10/14/2019    Lab Results  Component Value Date   HGBA1C 6.0 (H) 04/26/2021        Component Value Date/Time   CHOL 186 04/26/2021 0946   TRIG 149 04/26/2021 0946   HDL 73 04/26/2021 0946   CHOLHDL 2.5 04/26/2021 0946   CHOLHDL 2.2 10/14/2019 1202   VLDL 17 07/13/2015 1113   LDLCALC 88 04/26/2021 0946   LDLCALC 64 10/14/2019 1202    LDLDIRECT 231.2 03/28/2010 0948       Latest Ref Rng & Units 04/26/2021    9:46 AM 04/26/2020    4:30 PM 10/14/2019   12:02 PM  Hepatic Function  Total Protein 6.0 - 8.5 g/dL 6.8  7.5  7.3   Albumin 3.7 - 4.7 g/dL 4.3  4.3    AST 0 - 40 IU/L 30  24  27    ALT 0 - 32 IU/L 24  18  20    Alk Phosphatase 44 - 121 IU/L 117  94    Total Bilirubin 0.0 - 1.2 mg/dL 0.3  0.4  0.6     Lab Results  Component Value Date/Time   TSH 2.770 04/30/2019 09:46 AM   TSH 1.71 03/12/2016 02:34 PM       Latest Ref Rng & Units 04/26/2021    9:46 AM 04/30/2019    9:46 AM 10/16/2017   11:23 AM  CBC  WBC 3.4 - 10.8 x10E3/uL 6.5  6.3  6.7   Hemoglobin 11.1 - 15.9 g/dL 12.1  12.2  12.6   Hematocrit 34.0 - 46.6 % 36.7  37.1  37.1   Platelets 150 - 450 x10E3/uL 303  372  376     Lab Results  Component Value Date/Time   VD25OH 24.70 (L) 04/26/2020 04:30 PM    Social History   Tobacco Use  Smoking Status Never  Smokeless Tobacco Never   BP Readings from Last 3 Encounters:  07/28/21 116/74  07/04/21 112/73  04/20/21 130/76   Pulse Readings from Last 3  Encounters:  07/28/21 80  07/04/21 94  04/20/21 65   Wt Readings from Last 3 Encounters:  07/28/21 182 lb 4 oz (82.7 kg)  07/04/21 190 lb (86.2 kg)  04/20/21 193 lb (87.5 kg)    Assessment: Review of patient past medical history, allergies, medications, health status, including review of consultants reports, laboratory and other test data, was performed as part of comprehensive evaluation and provision of chronic care management services.   SDOH:  (Social Determinants of Health) assessments and interventions performed:    CCM Care Plan  Allergies  Allergen Reactions   Trazodone And Nefazodone Other (See Comments)    Nausea, fatique and sweating   Codeine Itching    *Can take Hydrocodone*   Protriptyline Palpitations   Tramadol Other (See Comments)    headache   Vivactil [Protriptyline Hcl] Palpitations    Medications Reviewed Today      Reviewed by [name], [facility] (Pharmacist) on 08/03/21 at 1349  Med List Status: <None>   Medication Order Taking? Sig Documenting Provider Last Dose Status Informant  AMBULATORY NON FORMULARY MEDICATION [id number] Yes Please provide cpap supplies: heated tubing, humidifier chamber, disposable filters, Mask/Mask frame/Head gear per patient preference [name], DO Taking Active   Doxylamine Succinate, Sleep, (UNISOM SLEEPTABS PO) [id number] Yes Take 50 mg by mouth at bedtime. [provider] Taking Active   EPINEPHrine 0.3 mg/0.3 mL IJ SOAJ injection [id number] Yes Inject 0.3 mg into the muscle as needed for anaphylaxis. Kennith Gain, MD Taking Active   ipratropium (ATROVENT) 0.03 % nasal spray [id number] Yes Place 2 sprays into both nostrils 2 (two) times daily as needed for rhinitis.  Patient taking differently: Place 2 sprays into both nostrils daily.   [name], DO Taking Active   levocetirizine (XYZAL) 5 MG tablet [id number] Yes Take 1 tablet (5 mg total) by mouth every evening. Kennith Gain, MD Taking Active   meloxicam [facility]) 7.5 MG tablet [id number] Yes TAKE 1 TABLET BY MOUTH  DAILY AS NEEDED FOR PAIN [name], DO Taking Active   montelukast (SINGULAIR) 10 MG tablet [id number] Yes Take 1 tablet (10 mg total) by mouth at bedtime. Kennith Gain, MD Taking Active   Multiple Vitamin (MULTIVITAMIN) tablet [id number] Yes Take 1 tablet by mouth daily. [provider] Taking Active   pantoprazole (PROTONIX) 40 MG tablet [id number] Yes TAKE 1 TABLET BY MOUTH  DAILY [name], DO Taking Active   phentermine 30 MG capsule [id number] Yes Take 1 capsule (30 mg total) by mouth every morning. [name], DO Taking Active   rosuvastatin (CRESTOR) 20 MG tablet [id number] Yes Take 1 tablet (20 mg total) by mouth daily. [name], DO Taking Active   SUMAtriptan (IMITREX) 50 MG tablet [id number] Yes Take 1 tablet (50 mg total) by  mouth every 2 (two) hours as needed for migraine. May repeat in 2 hours if headache persists or recurs. [name], DO Taking Active   tiZANidine (ZANAFLEX) 4 MG tablet [id number] No Take 4 mg by mouth every 6 (six) hours as needed for muscle spasms.  Patient not taking: Reported on 08/03/2021   [provider] Not Taking Active   triamcinolone cream (KENALOG) 0.1 % [id number] Yes Apply 1 application topically 2 (two) times daily. [name], DO Taking Active             Patient Active Problem List   Diagnosis Date Noted   Osteoporosis 04/18/2020   Chronic rhinitis 12/28/2019   Osteoarthritis 10/18/2019  Weight gain 04/13/2019   GERD (gastroesophageal reflux disease) 04/13/2019   Insomnia 04/13/2019   DJD (degenerative joint disease) of knee 09/06/2015   Degenerative tear of medial meniscus 09/06/2015   Severe sleep apnea 07/21/2015   Osteopenia determined by x-ray 07/20/2015   Lumbago 06/15/2015   Left knee pain 06/10/2015   Carcinoid tumor 05/18/2014   Sleep pattern disturbance 01/26/2014   Obesity, unspecified 03/25/2012   HLD (hyperlipidemia) 12/03/2006   VERTIGO, HX OF 12/03/2006   Migraine 12/03/2006    Immunization History  Administered Date(s) Administered   Fluad Quad(high Dose 65+) 10/14/2019, 10/13/2020   Influenza, High Dose Seasonal PF 10/23/2016, 09/30/2018   Influenza,inj,Quad PF,6+ Mos 11/10/2015, 10/14/2017   Influenza-Unspecified 10/23/2016   PFIZER(Purple Top)SARS-COV-2 Vaccination 04/01/2019, 04/22/2019, 12/04/2019, 06/10/2020   Pfizer Covid-19 Vaccine Bivalent Booster 31yr & up 10/31/2020   Pneumococcal Conjugate-13 07/13/2015   Pneumococcal Polysaccharide-23 03/25/2012, 05/16/2017   Td 05/20/2003   Tdap 01/26/2014   Zoster, Live 04/25/2010    Conditions to be addressed/monitored: HLD and chronic pain, insomnia  Care Plan : Medication Management  Updates made by KDarius Bump RPH since 08/04/2021 12:00 AM     Problem: HLD,  chronic pain, insomnia      Long-Range Goal: Disease Progression Prevention   Start Date: 07/11/2020  Recent Progress: On track  Priority: High  Note:   Current Barriers:  None at present  Pharmacist Clinical Goal(s):  Over the next 30 days, patient will adhere to plan to optimize therapeutic regimen for insomnia as evidenced by report of adherence to recommended medication management changes through collaboration with PharmD and provider.   Interventions: 1:1 collaboration with MLuetta Nutting DO regarding development and update of comprehensive plan of care as evidenced by provider attestation and co-signature Inter-disciplinary care team collaboration (see longitudinal plan of care) Comprehensive medication review performed; medication list updated in electronic medical record  Hyperlipidemia:  Controlled; current treatment:rosuvastatin 236mdaily;   Recommended continue current regimen  Chronic Pain (Ostoearthritis)  Controlled; current treatment: PRN meloxicam, PRN tizanidine, physical therapy; not currently taking gabapentin & doing well without it  Recommend: continue current regimen  Insomnia  Controlled; current treatment: unisom 5080mightly (failed melatonin but unisom working "mostly" well)  Recommend continue current regimen  Patient Goals/Self-Care Activities Over the next 30 days, patient will:  take medications as prescribed  Follow Up Plan: Telephone follow up appointment with care management team member scheduled for:  1 month       Medication Assistance: None required.  Patient affirms current coverage meets needs.  [facility] pharmacy [facility]:  [id number] [place] [address]: [phone]: [phone] Mail [facility] ([facility] [facility] [place]: [phone]: [phone] Humboldt [facility] Centerivery ([facility] ) - [address] [address]: [phone]: [phone] pill box? No - has a system in her drawer/container for AM & PM, works for her Pt endorses 80% compliance  Follow Up:  Patient agrees to Care Plan and Follow-up.  Plan: Telephone follow up appointment with care management team member scheduled for:  1 month  KeesLarinda ButteryarmD Clinical Pharmacist [phone]

## 2021-08-04 ENCOUNTER — Ambulatory Visit (INDEPENDENT_AMBULATORY_CARE_PROVIDER_SITE_OTHER): Payer: Medicare Other | Admitting: *Deleted

## 2021-08-04 DIAGNOSIS — J309 Allergic rhinitis, unspecified: Secondary | ICD-10-CM

## 2021-08-04 NOTE — Patient Instructions (Signed)
Visit Information  Thank you for taking time to visit with me today. Please don't hesitate to contact me if I can be of assistance to you before our next scheduled telephone appointment.  Following are the goals we discussed today:  Patient Goals/Self-Care Activities Over the next 30 days, patient will:  take medications as prescribed  Follow Up Plan: Telephone follow up appointment with care management team member scheduled for:  1 month  Please call the care guide team at [phone] if you need to cancel or reschedule your appointment.    Patient verbalizes understanding of instructions and care plan provided today and agrees to view in MyChart. Active MyChart status and patient understanding of how to access instructions and care plan via MyChart confirmed with patient.     Christina Hayes J Christina Hayes  

## 2021-08-10 ENCOUNTER — Ambulatory Visit (INDEPENDENT_AMBULATORY_CARE_PROVIDER_SITE_OTHER): Payer: Medicare Other

## 2021-08-10 DIAGNOSIS — J309 Allergic rhinitis, unspecified: Secondary | ICD-10-CM | POA: Diagnosis not present

## 2021-08-17 ENCOUNTER — Ambulatory Visit (INDEPENDENT_AMBULATORY_CARE_PROVIDER_SITE_OTHER): Payer: Medicare Other

## 2021-08-17 DIAGNOSIS — J309 Allergic rhinitis, unspecified: Secondary | ICD-10-CM | POA: Diagnosis not present

## 2021-08-21 ENCOUNTER — Encounter (INDEPENDENT_AMBULATORY_CARE_PROVIDER_SITE_OTHER): Payer: Medicare Other | Admitting: Family Medicine

## 2021-08-21 DIAGNOSIS — E782 Mixed hyperlipidemia: Secondary | ICD-10-CM

## 2021-08-21 DIAGNOSIS — R7303 Prediabetes: Secondary | ICD-10-CM | POA: Diagnosis not present

## 2021-08-22 ENCOUNTER — Telehealth: Payer: Self-pay | Admitting: Family Medicine

## 2021-08-22 NOTE — Telephone Encounter (Signed)
Patient filled out patient asst. paperwork. Placed in your box. Patient thought she messed up one portion & she put a sticky note on the page where she thought you may need to reprint & correct.

## 2021-08-23 ENCOUNTER — Encounter: Payer: Self-pay | Admitting: Family Medicine

## 2021-08-23 NOTE — Telephone Encounter (Signed)

## 2021-08-25 ENCOUNTER — Ambulatory Visit (INDEPENDENT_AMBULATORY_CARE_PROVIDER_SITE_OTHER): Payer: Medicare Other

## 2021-08-25 DIAGNOSIS — J309 Allergic rhinitis, unspecified: Secondary | ICD-10-CM | POA: Diagnosis not present

## 2021-08-31 ENCOUNTER — Ambulatory Visit (INDEPENDENT_AMBULATORY_CARE_PROVIDER_SITE_OTHER): Payer: Medicare Other | Admitting: Pharmacist

## 2021-08-31 DIAGNOSIS — R7303 Prediabetes: Secondary | ICD-10-CM

## 2021-08-31 DIAGNOSIS — E782 Mixed hyperlipidemia: Secondary | ICD-10-CM

## 2021-08-31 DIAGNOSIS — F5101 Primary insomnia: Secondary | ICD-10-CM

## 2021-08-31 NOTE — [place] Notes (Signed)
Chronic Care Management Pharmacy Note  08/31/2021 Name:  Christina Hayes MRN:  [id number] DOB:  12/15/1950  Summary: addressed insomnia, HLD, chronic pain - primarily ozempic PAP for pre-diabetes + weight management.  Baseline weight 200lb, currently at 180-182lb. Not currently active physically, but previously did water aerobics. Aiming for fresher fruits or vegetables now that it is summer time.   Recommendations/Changes made from today's visit:  - Counseled patient on new ozempic, answered questions, addressed patient concerns - No medication changes, await response from [employer] for patient assistance of ozempic  Plan:  f/u with pharmacist in 1 month  Subjective: Christina Hayes is an 71 y.o. year old female who is a primary patient of [name], DO.  The CCM team was consulted for assistance with disease management and care coordination needs.    Engaged with patient by telephone for follow up visit in response to provider referral for pharmacy case management and/or care coordination services.   Consent to Services:  The patient was given information about Chronic Care Management services, agreed to services, and gave verbal consent prior to initiation of services.  Please see initial visit note for detailed documentation.   Patient Care Team: [name], DO as PCP - General (Family Medicine)   Objective:  Lab Results  Component Value Date   CREATININE 0.76 04/26/2021   CREATININE 0.75 04/26/2020   CREATININE 0.82 10/14/2019    Lab Results  Component Value Date   HGBA1C 6.0 (H) 04/26/2021        Component Value Date/Time   CHOL 186 04/26/2021 0946   TRIG 149 04/26/2021 0946   HDL 73 04/26/2021 0946   CHOLHDL 2.5 04/26/2021 0946   CHOLHDL 2.2 10/14/2019 1202   VLDL 17 07/13/2015 1113   LDLCALC 88 04/26/2021 0946   LDLCALC 64 10/14/2019 1202   LDLDIRECT 231.2 03/28/2010 0948       Latest Ref Rng & Units 04/26/2021    9:46 AM 04/26/2020    4:30 PM  10/14/2019   12:02 PM  Hepatic Function  Total Protein 6.0 - 8.5 g/dL 6.8  7.5  7.3   Albumin 3.7 - 4.7 g/dL 4.3  4.3    AST 0 - 40 IU/L 30  24  27    ALT 0 - 32 IU/L 24  18  20    Alk Phosphatase 44 - 121 IU/L 117  94    Total Bilirubin 0.0 - 1.2 mg/dL 0.3  0.4  0.6     Lab Results  Component Value Date/Time   TSH 2.770 04/30/2019 09:46 AM   TSH 1.71 03/12/2016 02:34 PM       Latest Ref Rng & Units 04/26/2021    9:46 AM 04/30/2019    9:46 AM 10/16/2017   11:23 AM  CBC  WBC 3.4 - 10.8 x10E3/uL 6.5  6.3  6.7   Hemoglobin 11.1 - 15.9 g/dL 12.1  12.2  12.6   Hematocrit 34.0 - 46.6 % 36.7  37.1  37.1   Platelets 150 - 450 x10E3/uL 303  372  376     Lab Results  Component Value Date/Time   VD25OH 24.70 (L) 04/26/2020 04:30 PM    Social History   Tobacco Use  Smoking Status Never  Smokeless Tobacco Never   BP Readings from Last 3 Encounters:  07/28/21 116/74  07/04/21 112/73  04/20/21 130/76   Pulse Readings from Last 3 Encounters:  07/28/21 80  07/04/21 94  04/20/21 65   Wt Readings from Last 3  Encounters:  07/28/21 182 lb 4 oz (82.7 kg)  07/04/21 190 lb (86.2 kg)  04/20/21 193 lb (87.5 kg)    Assessment: Review of patient past medical history, allergies, medications, health status, including review of consultants reports, laboratory and other test data, was performed as part of comprehensive evaluation and provision of chronic care management services.   SDOH:  (Social Determinants of Health) assessments and interventions performed:    CCM Care Plan  Allergies  Allergen Reactions   Trazodone And Nefazodone Other (See Comments)    Nausea, fatique and sweating   Codeine Itching    *Can take Hydrocodone*   Protriptyline Palpitations   Tramadol Other (See Comments)    headache   Vivactil [Protriptyline Hcl] Palpitations    Medications Reviewed Today     Reviewed by [name], RPH (Pharmacist) on 08/31/21 at 1348  Med List Status: <None>    Medication Order Taking? Sig Documenting Provider Last Dose Status Informant  AMBULATORY NON FORMULARY MEDICATION [id number] Yes Please provide cpap supplies: heated tubing, humidifier chamber, disposable filters, Mask/Mask frame/Head gear per patient preference [name], DO Taking Active   Doxylamine Succinate, Sleep, (UNISOM SLEEPTABS PO) [id number] Yes Take 50 mg by mouth at bedtime. [provider] Taking Active   EPINEPHrine 0.3 mg/0.3 mL IJ SOAJ injection [id number] Yes Inject 0.3 mg into the muscle as needed for anaphylaxis. Kennith Gain, MD Taking Active   ipratropium (ATROVENT) 0.03 % nasal spray [id number] Yes Place 2 sprays into both nostrils 2 (two) times daily as needed for rhinitis.  Patient taking differently: Place 2 sprays into both nostrils daily.   [name], DO Taking Active   levocetirizine (XYZAL) 5 MG tablet [id number] Yes Take 1 tablet (5 mg total) by mouth every evening. Kennith Gain, MD Taking Active   meloxicam [facility]) 7.5 MG tablet [id number] Yes TAKE 1 TABLET BY MOUTH  DAILY AS NEEDED FOR PAIN [name], DO Taking Active   montelukast (SINGULAIR) 10 MG tablet [id number] Yes Take 1 tablet (10 mg total) by mouth at bedtime. Kennith Gain, MD Taking Active   Multiple Vitamin (MULTIVITAMIN) tablet [id number] Yes Take 1 tablet by mouth daily. [provider] Taking Active   pantoprazole (PROTONIX) 40 MG tablet [id number] Yes TAKE 1 TABLET BY MOUTH  DAILY [name], DO Taking Active   phentermine 30 MG capsule [id number] Yes Take 1 capsule (30 mg total) by mouth every morning. [name], DO Taking Active   rosuvastatin (CRESTOR) 20 MG tablet [id number] Yes Take 1 tablet (20 mg total) by mouth daily. [name], DO Taking Active   SUMAtriptan (IMITREX) 50 MG tablet [id number] Yes Take 1 tablet (50 mg total) by mouth every 2 (two) hours as needed for migraine. May repeat in 2 hours if headache persists or  recurs. [name], DO Taking Active   tiZANidine (ZANAFLEX) 4 MG tablet [id number] Yes Take 4 mg by mouth every 6 (six) hours as needed for muscle spasms. [provider] Taking Active   triamcinolone cream (KENALOG) 0.1 % [id number] Yes Apply 1 application topically 2 (two) times daily. [name], DO Taking Active             Patient Active Problem List   Diagnosis Date Noted   Osteoporosis 04/18/2020   Chronic rhinitis 12/28/2019   Osteoarthritis 10/18/2019   Weight gain 04/13/2019   GERD (gastroesophageal reflux disease) 04/13/2019   Insomnia 04/13/2019   DJD (degenerative joint disease) of knee 09/06/2015   Degenerative tear  of medial meniscus 09/06/2015   Severe sleep apnea 07/21/2015   Osteopenia determined by x-ray 07/20/2015   Lumbago 06/15/2015   Left knee pain 06/10/2015   Carcinoid tumor 05/18/2014   Sleep pattern disturbance 01/26/2014   Obesity, unspecified 03/25/2012   HLD (hyperlipidemia) 12/03/2006   VERTIGO, HX OF 12/03/2006   Migraine 12/03/2006    Immunization History  Administered Date(s) Administered   Fluad Quad(high Dose 65+) 10/14/2019, 10/13/2020   Influenza, High Dose Seasonal PF 10/23/2016, 09/30/2018   Influenza,inj,Quad PF,6+ Mos 11/10/2015, 10/14/2017   Influenza-Unspecified 10/23/2016   PFIZER(Purple Top)SARS-COV-2 Vaccination 04/01/2019, 04/22/2019, 12/04/2019, 06/10/2020   Pfizer Covid-19 Vaccine Bivalent Booster 1yr & up 10/31/2020   Pneumococcal Conjugate-13 07/13/2015   Pneumococcal Polysaccharide-23 03/25/2012, 05/16/2017   Td 05/20/2003   Tdap 01/26/2014   Zoster, Live 04/25/2010    Conditions to be addressed/monitored: HLD and chronic pain, insomnia  Care Plan : Medication Management  Updates made by KDarius Bump RPH since 08/31/2021 12:00 AM     Problem: HLD, chronic pain, insomnia      Long-Range Goal: Disease Progression Prevention   Start Date: 07/11/2020  Recent Progress: On track  Priority:  High  Note:   Current Barriers:  None at present   Pharmacist Clinical Goal(s):  Over the next 30 days, patient will adhere to plan to optimize therapeutic regimen for insomnia as evidenced by report of adherence to recommended medication management changes through collaboration with PharmD and provider.   Interventions: 1:1 collaboration with MLuetta Nutting DO regarding development and update of comprehensive plan of care as evidenced by provider attestation and co-signature Inter-disciplinary care team collaboration (see longitudinal plan of care) Comprehensive medication review performed; medication list updated in electronic medical record  Hyperlipidemia:  Controlled; current treatment:rosuvastatin 257mdaily;   Recommended continue current regimen  Chronic Pain (Ostoearthritis)  Controlled; current treatment: PRN meloxicam, PRN tizanidine, physical therapy; not currently taking gabapentin & doing well without it  Recommend: continue current regimen  Insomnia  Controlled; current treatment: unisom 5034mightly (failed melatonin but unisom working "mostly" well)  Recommend continue current regimen  Patient Goals/Self-Care Activities Over the next 30 days, patient will:  take medications as prescribed  Follow Up Plan: Telephone follow up appointment with care management team member scheduled for:  1 month        Medication Assistance: None required.  Patient affirms current coverage meets needs.  [facility] pharmacy [facility]:  [id number] [place] [facility] [address]: [phone]: [phone] Mail Service ([facility] [facility] [place]: [phone]: [phone] Centerivery ([facility]) - [place]: [phone]: [phone] pill box? No - has a system in her drawer/container for AM & PM, works for her Pt endorses 80% compliance  Follow Up:  Patient agrees to Care Plan and Follow-up.  Plan: Telephone follow up appointment with care management team member scheduled for:  1 month  KeesLarinda ButteryarmD Clinical Pharmacist [facility]-[phone]

## 2021-08-31 NOTE — Patient Instructions (Signed)
Visit Information  Thank you for taking time to visit with me today. Please don't hesitate to contact me if I can be of assistance to you before our next scheduled telephone appointment.  Following are the goals we discussed today:  Patient Goals/Self-Care Activities Over the next 30 days, patient will:  take medications as prescribed  Follow Up Plan: Telephone follow up appointment with care management team member scheduled for:  1 month  Please call the care guide team at [phone] if you need to cancel or reschedule your appointment.    Patient verbalizes understanding of instructions and care plan provided today and agrees to view in MyChart. Active MyChart status and patient understanding of how to access instructions and care plan via MyChart confirmed with patient.     Christina Hayes  

## 2021-09-01 ENCOUNTER — Ambulatory Visit (INDEPENDENT_AMBULATORY_CARE_PROVIDER_SITE_OTHER): Payer: Medicare Other

## 2021-09-01 DIAGNOSIS — J309 Allergic rhinitis, unspecified: Secondary | ICD-10-CM | POA: Diagnosis not present

## 2021-09-08 ENCOUNTER — Ambulatory Visit (INDEPENDENT_AMBULATORY_CARE_PROVIDER_SITE_OTHER): Payer: Medicare Other | Admitting: *Deleted

## 2021-09-08 DIAGNOSIS — J309 Allergic rhinitis, unspecified: Secondary | ICD-10-CM

## 2021-09-12 ENCOUNTER — Ambulatory Visit (INDEPENDENT_AMBULATORY_CARE_PROVIDER_SITE_OTHER): Payer: Medicare Other | Admitting: *Deleted

## 2021-09-12 DIAGNOSIS — J309 Allergic rhinitis, unspecified: Secondary | ICD-10-CM | POA: Diagnosis not present

## 2021-09-21 ENCOUNTER — Ambulatory Visit (INDEPENDENT_AMBULATORY_CARE_PROVIDER_SITE_OTHER): Payer: Medicare Other

## 2021-09-21 DIAGNOSIS — E785 Hyperlipidemia, unspecified: Secondary | ICD-10-CM | POA: Diagnosis not present

## 2021-09-21 DIAGNOSIS — M1990 Unspecified osteoarthritis, unspecified site: Secondary | ICD-10-CM | POA: Diagnosis not present

## 2021-09-21 DIAGNOSIS — J309 Allergic rhinitis, unspecified: Secondary | ICD-10-CM

## 2021-09-28 ENCOUNTER — Ambulatory Visit (INDEPENDENT_AMBULATORY_CARE_PROVIDER_SITE_OTHER): Payer: Medicare Other | Admitting: *Deleted

## 2021-09-28 DIAGNOSIS — J309 Allergic rhinitis, unspecified: Secondary | ICD-10-CM | POA: Diagnosis not present

## 2021-10-03 NOTE — [place] Notes (Signed)
VIALS EXP 10-04-22 

## 2021-10-04 ENCOUNTER — Ambulatory Visit (INDEPENDENT_AMBULATORY_CARE_PROVIDER_SITE_OTHER): Payer: Medicare Other | Admitting: *Deleted

## 2021-10-04 DIAGNOSIS — J309 Allergic rhinitis, unspecified: Secondary | ICD-10-CM

## 2021-10-05 ENCOUNTER — Telehealth: Payer: Self-pay | Admitting: Family Medicine

## 2021-10-05 DIAGNOSIS — J3089 Other allergic rhinitis: Secondary | ICD-10-CM | POA: Diagnosis not present

## 2021-10-05 NOTE — Telephone Encounter (Signed)
Received 5 boxes of Ozempic 0.25 / 0.5 on 9/14 at 9:50 am. I will call patient to pick up medication and place in fridge. I spoke with patient and she stated she will pick up medication soon. - CF

## 2021-10-06 DIAGNOSIS — J301 Allergic rhinitis due to pollen: Secondary | ICD-10-CM | POA: Diagnosis not present

## 2021-10-11 ENCOUNTER — Ambulatory Visit (INDEPENDENT_AMBULATORY_CARE_PROVIDER_SITE_OTHER): Payer: Medicare Other | Admitting: *Deleted

## 2021-10-11 DIAGNOSIS — J309 Allergic rhinitis, unspecified: Secondary | ICD-10-CM | POA: Diagnosis not present

## 2021-10-17 ENCOUNTER — Ambulatory Visit (INDEPENDENT_AMBULATORY_CARE_PROVIDER_SITE_OTHER): Payer: Medicare Other | Admitting: Pharmacist

## 2021-10-17 DIAGNOSIS — F5101 Primary insomnia: Secondary | ICD-10-CM

## 2021-10-17 DIAGNOSIS — E782 Mixed hyperlipidemia: Secondary | ICD-10-CM

## 2021-10-17 DIAGNOSIS — R7303 Prediabetes: Secondary | ICD-10-CM

## 2021-10-17 NOTE — Progress Notes (Signed)
Chronic Care Management Pharmacy Note  10/18/2021 Name:  Christina Hayes MRN:  [id number] DOB:  08/16/1950  Summary: addressed insomnia, HLD, chronic pain - primarily ozempic PAP for pre-diabetes + weight management.  Sunday 10/15/21 was patient's first dose of ozempic. She reports no side effects, intolerances, or noticeable differences.  Baseline weight 200lb, currently at 180-182lb. Not currently active physically, but previously did water aerobics. Aiming for fresher fruits or vegetables now that it is summer time.    Recommendations/Changes made from today's visit:  - Counseled patient on ozempic dosing strategy, answered questions - Recommend patient increase to ozempic 0.53m weekly on 11/12/21 -Reviewed non-pharmacologic strategies for insomnia at length. Recommend earlier bedtime no later than midnight, bedtime routine, and trying meditation or soothing music instead of screens or housework if feeling wide awake.  Plan:  f/u with pharmacist in 1 month  Subjective: Christina NUSZis an 71y.o. year old female who is a primary patient of MLuetta Nutting DO.  The CCM team was consulted for assistance with disease management and care coordination needs.    Engaged with patient by telephone for follow up visit in response to provider referral for pharmacy case management and/or care coordination services.   Consent to Services:  The patient was given information about Chronic Care Management services, agreed to services, and gave verbal consent prior to initiation of services.  Please see initial visit note for detailed documentation.   Patient Care Team: MLuetta Nutting DO as PCP - General (Family Medicine)   Objective:  Lab Results  Component Value Date   CREATININE 0.76 04/26/2021   CREATININE 0.75 04/26/2020   CREATININE 0.82 10/14/2019    Lab Results  Component Value Date   HGBA1C 6.0 (H) 04/26/2021        Component Value Date/Time   CHOL 186 04/26/2021 0946    TRIG 149 04/26/2021 0946   HDL 73 04/26/2021 0946   CHOLHDL 2.5 04/26/2021 0946   CHOLHDL 2.2 10/14/2019 1202   VLDL 17 07/13/2015 1113   LDLCALC 88 04/26/2021 0946   LDLCALC 64 10/14/2019 1202   LDLDIRECT 231.2 03/28/2010 0948       Latest Ref Rng & Units 04/26/2021    9:46 AM 04/26/2020    4:30 PM 10/14/2019   12:02 PM  Hepatic Function  Total Protein 6.0 - 8.5 g/dL 6.8  7.5  7.3   Albumin 3.7 - 4.7 g/dL 4.3  4.3    AST 0 - 40 IU/L 30  24  27    ALT 0 - 32 IU/L 24  18  20    Alk Phosphatase 44 - 121 IU/L 117  94    Total Bilirubin 0.0 - 1.2 mg/dL 0.3  0.4  0.6     Lab Results  Component Value Date/Time   TSH 2.770 04/30/2019 09:46 AM   TSH 1.71 03/12/2016 02:34 PM       Latest Ref Rng & Units 04/26/2021    9:46 AM 04/30/2019    9:46 AM 10/16/2017   11:23 AM  CBC  WBC 3.4 - 10.8 x10E3/uL 6.5  6.3  6.7   Hemoglobin 11.1 - 15.9 g/dL 12.1  12.2  12.6   Hematocrit 34.0 - 46.6 % 36.7  37.1  37.1   Platelets 150 - 450 x10E3/uL 303  372  376     Lab Results  Component Value Date/Time   VD25OH 24.70 (L) 04/26/2020 04:30 PM    Social History   Tobacco Use  Smoking Status Never  Smokeless Tobacco Never   BP Readings from Last 3 Encounters:  07/28/21 116/74  07/04/21 112/73  04/20/21 130/76   Pulse Readings from Last 3 Encounters:  07/28/21 80  07/04/21 94  04/20/21 65   Wt Readings from Last 3 Encounters:  07/28/21 182 lb 4 oz (82.7 kg)  07/04/21 190 lb (86.2 kg)  04/20/21 193 lb (87.5 kg)    Assessment: Review of patient past medical history, allergies, medications, health status, including review of consultants reports, laboratory and other test data, was performed as part of comprehensive evaluation and provision of chronic care management services.   SDOH:  (Social Determinants of Health) assessments and interventions performed:  SDOH Interventions    Flowsheet Row Office Visit from 03/21/2020 in [place] Office Visit from  10/14/2017 in [place] Interventions Intervention Not Indicated --  Housing Interventions Intervention Not Indicated --  Transportation Interventions Intervention Not Indicated --  Depression Interventions/Treatment  -- Counseling  Financial Strain Interventions Intervention Not Indicated --  Physical Activity Interventions Intervention Not Indicated --  Stress Interventions Intervention Not Indicated --  Social Connections Interventions Intervention Not Indicated --       CCM Care Plan  Allergies  Allergen Reactions   Trazodone And Nefazodone Other (See Comments)    Nausea, fatique and sweating   Codeine Itching    *Can take Hydrocodone*   Protriptyline Palpitations   Tramadol Other (See Comments)    headache   Vivactil [Protriptyline Hcl] Palpitations    Medications Reviewed Today     Reviewed by [name], [facility] (Pharmacist) on 10/18/21 at [place] List Status: <None>   Medication Order Taking? Sig Documenting Provider Last Dose Status Informant  AMBULATORY NON FORMULARY MEDICATION [id number] No Please provide cpap supplies: heated tubing, humidifier chamber, disposable filters, Mask/Mask frame/Head gear per patient preference [name], DO Taking Active   Doxylamine Succinate, Sleep, (UNISOM SLEEPTABS PO) [id number] No Take 50 mg by mouth at bedtime. [provider] Taking Active   EPINEPHrine 0.3 mg/0.3 mL IJ SOAJ injection [id number] No Inject 0.3 mg into the muscle as needed for anaphylaxis. Kennith Gain, MD Taking Active   ipratropium (ATROVENT) 0.03 % nasal spray [id number] No Place 2 sprays into both nostrils 2 (two) times daily as needed for rhinitis.  Patient taking differently: Place 2 sprays into both nostrils daily.   [name], DO Taking Active   levocetirizine (XYZAL) 5 MG tablet [id number] No Take 1 tablet (5 mg total) by mouth every evening. Kennith Gain, MD Taking Active   meloxicam [facility]) 7.5 MG tablet [id number] No TAKE 1 TABLET BY MOUTH  DAILY AS NEEDED FOR PAIN [name], DO Taking Active   montelukast (SINGULAIR) 10 MG tablet [id number] No Take 1 tablet (10 mg total) by mouth at bedtime. Kennith Gain, MD Taking Active   Multiple Vitamin (MULTIVITAMIN) tablet [id number] No Take 1 tablet by mouth daily. [provider] Taking Active   pantoprazole (PROTONIX) 40 MG tablet [id number] No TAKE 1 TABLET BY MOUTH  DAILY [name], DO Taking Active   phentermine 30 MG capsule [id number] No Take 1 capsule (30 mg total) by mouth every morning. [name], DO Taking Active   rosuvastatin (CRESTOR) 20 MG tablet [id number] No Take 1 tablet (20 mg total) by mouth daily. [name], DO Taking Active   SUMAtriptan (IMITREX) 50 MG tablet [id number] No Take  1 tablet (50 mg total) by mouth every 2 (two) hours as needed for migraine. May repeat in 2 hours if headache persists or recurs. [name], DO Taking Active   tiZANidine (ZANAFLEX) 4 MG tablet [id number] No Take 4 mg by mouth every 6 (six) hours as needed for muscle spasms. [provider] Taking Active   triamcinolone cream (KENALOG) 0.1 % [id number] No Apply 1 application topically 2 (two) times daily. [name], DO Taking Active             Patient Active Problem List   Diagnosis Date Noted   Osteoporosis 04/18/2020   Chronic rhinitis 12/28/2019   Osteoarthritis 10/18/2019   Weight gain 04/13/2019   GERD (gastroesophageal reflux disease) 04/13/2019   Insomnia 04/13/2019   DJD (degenerative joint disease) of knee 09/06/2015   Degenerative tear of medial meniscus 09/06/2015   Severe sleep apnea 07/21/2015   Osteopenia determined by x-ray 07/20/2015   Lumbago 06/15/2015   Left knee pain 06/10/2015   Carcinoid tumor 05/18/2014   Sleep pattern disturbance 01/26/2014   Obesity, unspecified 03/25/2012   HLD (hyperlipidemia)  12/03/2006   VERTIGO, HX OF 12/03/2006   Migraine 12/03/2006    Immunization History  Administered Date(s) Administered   Fluad Quad(high Dose 65+) 10/14/2019, 10/13/2020   Influenza, High Dose Seasonal PF 10/23/2016, 09/30/2018   Influenza,inj,Quad PF,6+ Mos 11/10/2015, 10/14/2017   Influenza-Unspecified 10/23/2016   PFIZER(Purple Top)SARS-COV-2 Vaccination 04/01/2019, 04/22/2019, 12/04/2019, 06/10/2020   Pfizer Covid-19 Vaccine Bivalent Booster 61yr & up 10/31/2020   Pneumococcal Conjugate-13 07/13/2015   Pneumococcal Polysaccharide-23 03/25/2012, 05/16/2017   Td 05/20/2003   Tdap 01/26/2014   Zoster, Live 04/25/2010    Conditions to be addressed/monitored: HLD and chronic pain, insomnia  There are no care plans that you recently modified to display for this patient.       Medication Assistance: None required.  Patient affirms current coverage meets needs.  Patient's preferred pharmacy is:  [id number] [place] [place] - [address]: [phone]: [phone] (OpB[address]nellCAR[address] MountainoCol[facility]hone: [phone]: [phone] Cent[facility]very ([facility]) - [address] [address]hone: [phone]: [phone] Uses pill box? No - has a system in her drawer/container for AM & PM, works for her Pt endorses 80% compliance  Follow Up:  Patient agrees to Care Plan and Follow-up.  Plan: Telephone follow up appointment with care management team member scheduled for:  1 month  KeLarinda ButteryPharmD Clinical Pharmacist CoCoffe[phone]

## 2021-10-18 ENCOUNTER — Ambulatory Visit (INDEPENDENT_AMBULATORY_CARE_PROVIDER_SITE_OTHER): Payer: Medicare Other | Admitting: *Deleted

## 2021-10-18 DIAGNOSIS — J309 Allergic rhinitis, unspecified: Secondary | ICD-10-CM

## 2021-10-20 NOTE — Telephone Encounter (Signed)
Patient picked up medication - CF 

## 2021-10-23 ENCOUNTER — Encounter: Payer: Self-pay | Admitting: Family Medicine

## 2021-10-23 ENCOUNTER — Ambulatory Visit (INDEPENDENT_AMBULATORY_CARE_PROVIDER_SITE_OTHER): Payer: Medicare Other | Admitting: Family Medicine

## 2021-10-23 VITALS — BP 113/70 | HR 73 | Ht 62.0 in | Wt 185.0 lb

## 2021-10-23 DIAGNOSIS — R7303 Prediabetes: Secondary | ICD-10-CM | POA: Diagnosis not present

## 2021-10-23 DIAGNOSIS — E782 Mixed hyperlipidemia: Secondary | ICD-10-CM | POA: Diagnosis not present

## 2021-10-23 DIAGNOSIS — J310 Chronic rhinitis: Secondary | ICD-10-CM | POA: Diagnosis not present

## 2021-10-23 DIAGNOSIS — Z23 Encounter for immunization: Secondary | ICD-10-CM

## 2021-10-23 NOTE — Assessment & Plan Note (Signed)
Seeing allergist.  Doing well with allergy shots and current medications.  Recommend continuation

## 2021-10-23 NOTE — Progress Notes (Signed)
Christina Hayes - 71 y.o. female MRN [id number]  Date of birth: 07/03/1950  Subjective Chief Complaint  Patient presents with   Sleep    HPI Christina Hayes is a 71 y.o. female here today for follow up visit.   Previous A1c of 6.0 and started on Ozempic recently.  She is tolerating this well so far.  She continues to meet with our clinical pharmacist Althea Charon as well.  Her activity level is moderate.   Continues on crestor for management of co-existing HLD as well.  Tolerating this well without side effects.   Seeing allergist for chronic rhinitis.  Stable with singular and xyzal.    ROS:  A comprehensive ROS was completed and negative except as noted per HPI  Allergies  Allergen Reactions   Trazodone And Nefazodone Other (See Comments)    Nausea, fatique and sweating   Codeine Itching    *Can take Hydrocodone*   Protriptyline Palpitations   Tramadol Other (See Comments)    headache   Vivactil [Protriptyline Hcl] Palpitations    Past Medical History:  Diagnosis Date   Acid reflux    Allergic rhinitis, cause unspecified    Allergy    Anemia    years ago    Cataract    Complication of anesthesia SLOW TO WAKE   Cough    DJD (degenerative joint disease)    spine   Frequency of urination    H/O hiatal hernia    small   Head cold    Headache(784.0)    migraqine- uses essential oils   HYPERLIPIDEMIA, MIXED 12/03/2006   PT STATES NOT TAKING MED AS PRESCRIBED   MIGRAINES, HX OF 12/03/2006   Neuromuscular disorder (HCC)    Nocturia    OSA on CPAP    Osteopenia    PONV (postoperative nausea and vomiting)    in past   Shortness of breath    Sleep apnea    cpap   SUI (stress urinary incontinence, female)    Urgency of urination    VERTIGO, HX OF 12/03/2006    Past Surgical History:  Procedure Laterality Date   COLONOSCOPY     PUBOVAGINAL SLING  04/30/2011   Procedure: Gaynelle Arabian;  Surgeon: Christina Hayes;  Location: [facility];   Service: Urology;  Laterality: N/A;  sub urethral sling    possible ower   RIGID BRONCHOSCOPY N/A 04/23/2013   Procedure: LASER BRONCHOSCOPY;  Surgeon: Christina Hayes;  Location: [place];  Service: Thoracic;  Laterality: N/A;   [place] Bilateral 03/26/2013   Procedure: VIDEO BRONCHOSCOPY WITHOUT FLUORO;  Surgeon: Christina Hayes;  Location: [facility];  Service: Cardiopulmonary;  Laterality: Bilateral;   VIDEO BRONCHOSCOPY N/A 04/23/2013   Procedure: VIDEO BRONCHOSCOPY;  Surgeon: Christina Hayes;  Location: [place];  Service: Thoracic;  Laterality: N/A;   VIDEO BRONCHOSCOPY N/A 05/28/2014   Procedure: VIDEO BRONCHOSCOPY;  Surgeon: Christina Hayes;  Location: [place];  Service: Thoracic;  Laterality: N/A;    Social History   Socioeconomic History   Marital status: Married    Spouse name: Alvester Chou   Number of children: 1   Years of education: 12   Highest education level: 12th grade  Occupational History   Occupation: Retired    Comment: Emergency planning/management officer  Tobacco Use   Smoking status: Never   Smokeless tobacco: Never  Vaping Use   Vaping Use: Never used  Substance and Sexual Activity   Alcohol use: No   Drug use: No   Sexual activity: Not Currently    Partners: Male    Birth control/protection: Surgical  Other Topics Concern   Not on file  Social History Narrative   Lives with her husband. She has one child. She enjoys reading and art.   Social Determinants of Health   Financial Resource Strain: Low Risk  (06/06/2021)   Overall Financial Resource Strain (CARDIA)    Difficulty of Paying Living Expenses: Not hard at all  Food Insecurity: No Food Insecurity (06/06/2021)   Hunger Vital Sign    Worried About Running Out of Food in the Last Year: Never true    Ran Out of Food in the Last Year: Never true  Transportation Needs: No Transportation Needs (06/06/2021)    PRAPARE - Hydrologist (Medical): No    Lack of Transportation (Non-Medical): No  Physical Activity: Inactive (06/06/2021)   Exercise Vital Sign    Days of Exercise per Week: 0 days    Minutes of Exercise per Session: 40 min  Stress: No Stress Concern Present (06/06/2021)   [place]    Feeling of Stress : Not at all  Social Connections: Moderately Isolated (06/06/2021)   Social Connection and Isolation Panel [NHANES]    Frequency of Communication with Friends and Family: Twice a week    Frequency of Social Gatherings with Friends and Family: Once a week    Attends Religious Services: Never    Marine scientist or Organizations: No    Attends Music therapist: Never    Marital Status: Married    Family History  Problem Relation Age of Onset   Cancer Mother        Colon Cancer survivor   Diabetes Mother    Colon cancer Mother    Diabetes Father    Hyperlipidemia Father    Alzheimer's disease Father    Cancer Maternal Grandmother        Breast Cancer   Breast cancer Maternal Grandmother    Cancer Daughter        stage III-B breast CA   Breast cancer Daughter    Colon polyps Brother    Allergies Daughter    Esophageal cancer Neg Hx    Rectal cancer Neg Hx    Stomach cancer Neg Hx     Health Maintenance  Topic Date Due   INFLUENZA VACCINE  08/22/2021   Zoster Vaccines- Shingrix (1 of 2) 01/23/2022 (Originally 02/27/1969)   COVID-19 Vaccine (6 - Pfizer risk series) 02/22/2022 (Originally 12/26/2020)   COLONOSCOPY (Pts 45-40yrs Insurance coverage will need to be confirmed)  03/06/2023   MAMMOGRAM  05/18/2023   TETANUS/TDAP  01/27/2024   Pneumonia Vaccine 16+ Years old  Completed   DEXA SCAN  Completed   Hepatitis C Screening  Completed   HPV VACCINES  Aged Out      ----------------------------------------------------------------------------------------------------------------------------------------------------------------------------------------------------------------- Physical Exam BP 113/70 (BP Location: Left Arm, Patient Position: Sitting, Cuff Size: Large)   Pulse 73   Ht 5\' 2"  (1.575 m)   Wt 185 lb (83.9 kg)   SpO2 97%   BMI 33.84 kg/m   Physical Exam Constitutional:      Appearance: Normal appearance.  Eyes:     General: No scleral icterus. Cardiovascular:     Rate and Rhythm: Normal rate and  regular rhythm.  Pulmonary:     Effort: Pulmonary effort is normal.     Breath sounds: Normal breath sounds.  Musculoskeletal:     Cervical back: Neck supple.  Neurological:     Mental Status: She is alert.  Psychiatric:        Mood and Affect: Mood normal.        Behavior: Behavior normal.     ------------------------------------------------------------------------------------------------------------------------------------------------------------------------------------------------------------------- Assessment and Plan  Prediabetes Recently started on Ozempic. Tolerating well, continue titration to 0.5mg  in a couple of weeks. F/u in 3 months.   Chronic rhinitis Seeing allergist.  Doing well with allergy shots and current medications.  Recommend continuation  HLD (hyperlipidemia) Continue crestor at current strength    No orders of the defined types were placed in this encounter.   Return in about 3 months (around 01/23/2022) for F/u blood sugar.    This visit occurred during the SARS-CoV-2 public health emergency.  Safety protocols were in place, including screening questions prior to the visit, additional usage of staff PPE, and extensive cleaning of exam room while observing appropriate contact time as indicated for disinfecting solutions.

## 2021-10-23 NOTE — Assessment & Plan Note (Signed)
Continue crestor at current strength

## 2021-10-23 NOTE — Assessment & Plan Note (Signed)
Recently started on Ozempic. Tolerating well, continue titration to 0.5mg  in a couple of weeks. F/u in 3 months.

## 2021-10-25 ENCOUNTER — Ambulatory Visit (INDEPENDENT_AMBULATORY_CARE_PROVIDER_SITE_OTHER): Payer: Medicare Other

## 2021-10-25 DIAGNOSIS — J309 Allergic rhinitis, unspecified: Secondary | ICD-10-CM | POA: Diagnosis not present

## 2021-11-02 ENCOUNTER — Ambulatory Visit (INDEPENDENT_AMBULATORY_CARE_PROVIDER_SITE_OTHER): Payer: Medicare Other

## 2021-11-02 DIAGNOSIS — J309 Allergic rhinitis, unspecified: Secondary | ICD-10-CM

## 2021-11-08 ENCOUNTER — Encounter: Payer: Self-pay | Admitting: Family Medicine

## 2021-11-09 ENCOUNTER — Other Ambulatory Visit: Payer: Self-pay

## 2021-11-09 ENCOUNTER — Ambulatory Visit (INDEPENDENT_AMBULATORY_CARE_PROVIDER_SITE_OTHER): Payer: Medicare Other | Admitting: *Deleted

## 2021-11-09 DIAGNOSIS — J309 Allergic rhinitis, unspecified: Secondary | ICD-10-CM

## 2021-11-09 DIAGNOSIS — K219 Gastro-esophageal reflux disease without esophagitis: Secondary | ICD-10-CM

## 2021-11-09 MED ORDER — MELOXICAM 7.5 MG PO TABS: 7.5000 mg | ORAL_TABLET | Freq: Every day | ORAL | 2 refills | Status: DC | PRN

## 2021-11-09 MED ORDER — TRIAMCINOLONE ACETONIDE 0.1 % EX CREA: 1.0000 | TOPICAL_CREAM | Freq: Two times a day (BID) | CUTANEOUS | 3 refills | Status: AC

## 2021-11-09 MED ORDER — MONTELUKAST SODIUM 10 MG PO TABS
10.0000 mg | ORAL_TABLET | Freq: Every day | ORAL | 2 refills | Status: DC
Start: 2021-11-09 — End: 2022-03-05

## 2021-11-09 MED ORDER — ROSUVASTATIN CALCIUM 20 MG PO TABS: 20.0000 mg | ORAL_TABLET | Freq: Every day | ORAL | 2 refills | Status: DC

## 2021-11-09 MED ORDER — PANTOPRAZOLE SODIUM 40 MG PO TBEC: 40.0000 mg | DELAYED_RELEASE_TABLET | Freq: Every day | ORAL | 1 refills | Status: DC

## 2021-11-09 NOTE — Telephone Encounter (Signed)
Patient requesting a med refill for montelukast. Rx written by external provider. Rx pended. Please send the [facility].

## 2021-11-10 ENCOUNTER — Other Ambulatory Visit: Payer: Self-pay | Admitting: *Deleted

## 2021-11-10 ENCOUNTER — Encounter: Payer: Self-pay | Admitting: Allergy

## 2021-11-10 MED ORDER — LEVOCETIRIZINE DIHYDROCHLORIDE 5 MG PO TABS: 5.0000 mg | ORAL_TABLET | Freq: Every evening | ORAL | 1 refills | Status: DC

## 2021-11-17 ENCOUNTER — Ambulatory Visit (INDEPENDENT_AMBULATORY_CARE_PROVIDER_SITE_OTHER): Payer: Medicare Other

## 2021-11-17 DIAGNOSIS — J309 Allergic rhinitis, unspecified: Secondary | ICD-10-CM | POA: Diagnosis not present

## 2021-11-24 ENCOUNTER — Ambulatory Visit (INDEPENDENT_AMBULATORY_CARE_PROVIDER_SITE_OTHER): Payer: Medicare Other

## 2021-11-24 DIAGNOSIS — J309 Allergic rhinitis, unspecified: Secondary | ICD-10-CM | POA: Diagnosis not present

## 2021-11-30 ENCOUNTER — Ambulatory Visit (INDEPENDENT_AMBULATORY_CARE_PROVIDER_SITE_OTHER): Payer: Medicare Other

## 2021-11-30 DIAGNOSIS — J309 Allergic rhinitis, unspecified: Secondary | ICD-10-CM

## 2021-12-08 ENCOUNTER — Ambulatory Visit (INDEPENDENT_AMBULATORY_CARE_PROVIDER_SITE_OTHER): Payer: Medicare Other

## 2021-12-08 DIAGNOSIS — J309 Allergic rhinitis, unspecified: Secondary | ICD-10-CM | POA: Diagnosis not present

## 2021-12-12 ENCOUNTER — Ambulatory Visit (INDEPENDENT_AMBULATORY_CARE_PROVIDER_SITE_OTHER): Payer: Medicare Other

## 2021-12-12 DIAGNOSIS — J309 Allergic rhinitis, unspecified: Secondary | ICD-10-CM

## 2021-12-28 ENCOUNTER — Ambulatory Visit (INDEPENDENT_AMBULATORY_CARE_PROVIDER_SITE_OTHER): Payer: Medicare Other

## 2021-12-28 DIAGNOSIS — J309 Allergic rhinitis, unspecified: Secondary | ICD-10-CM

## 2022-01-04 DIAGNOSIS — J3089 Other allergic rhinitis: Secondary | ICD-10-CM

## 2022-01-04 NOTE — Progress Notes (Signed)
VIALS EXP 01-05-23 

## 2022-01-05 ENCOUNTER — Ambulatory Visit (INDEPENDENT_AMBULATORY_CARE_PROVIDER_SITE_OTHER): Payer: Medicare Other

## 2022-01-05 DIAGNOSIS — J309 Allergic rhinitis, unspecified: Secondary | ICD-10-CM | POA: Diagnosis not present

## 2022-01-08 DIAGNOSIS — J301 Allergic rhinitis due to pollen: Secondary | ICD-10-CM | POA: Diagnosis not present

## 2022-01-12 ENCOUNTER — Ambulatory Visit (INDEPENDENT_AMBULATORY_CARE_PROVIDER_SITE_OTHER): Payer: Medicare Other | Admitting: *Deleted

## 2022-01-12 DIAGNOSIS — J309 Allergic rhinitis, unspecified: Secondary | ICD-10-CM

## 2022-01-18 ENCOUNTER — Ambulatory Visit (INDEPENDENT_AMBULATORY_CARE_PROVIDER_SITE_OTHER): Payer: Medicare Other

## 2022-01-18 DIAGNOSIS — J309 Allergic rhinitis, unspecified: Secondary | ICD-10-CM

## 2022-01-24 ENCOUNTER — Encounter: Payer: Self-pay | Admitting: Family Medicine

## 2022-01-24 NOTE — Telephone Encounter (Signed)
Forwarding to Tok.

## 2022-01-25 ENCOUNTER — Encounter: Payer: Self-pay | Admitting: Family Medicine

## 2022-01-25 ENCOUNTER — Ambulatory Visit (INDEPENDENT_AMBULATORY_CARE_PROVIDER_SITE_OTHER): Payer: Medicare Other

## 2022-01-25 ENCOUNTER — Ambulatory Visit (INDEPENDENT_AMBULATORY_CARE_PROVIDER_SITE_OTHER): Payer: Medicare Other | Admitting: Family Medicine

## 2022-01-25 VITALS — BP 124/73 | HR 82 | Ht 62.0 in | Wt 178.0 lb

## 2022-01-25 DIAGNOSIS — R7303 Prediabetes: Secondary | ICD-10-CM | POA: Diagnosis not present

## 2022-01-25 DIAGNOSIS — E782 Mixed hyperlipidemia: Secondary | ICD-10-CM

## 2022-01-25 DIAGNOSIS — J309 Allergic rhinitis, unspecified: Secondary | ICD-10-CM | POA: Diagnosis not present

## 2022-01-25 DIAGNOSIS — F5101 Primary insomnia: Secondary | ICD-10-CM | POA: Diagnosis not present

## 2022-01-25 LAB — POCT GLYCOSYLATED HEMOGLOBIN (HGB A1C): HbA1c, POC (prediabetic range): 6.4 % (ref 5.7–6.4)

## 2022-01-25 MED ORDER — ZOLPIDEM TARTRATE 5 MG PO TABS: 2.5000 mg | ORAL_TABLET | Freq: Every evening | ORAL | 1 refills | Status: DC | PRN

## 2022-01-25 NOTE — Assessment & Plan Note (Signed)
She reports that she continues to struggle with insomnia.  Unisom is not as effective as it was in the past.  She did pretty well with Ambien and like to try this at one half tab daily.

## 2022-01-25 NOTE — Assessment & Plan Note (Signed)
Tolerating crestor at current strength.  Recommend continuation.

## 2022-01-25 NOTE — Progress Notes (Signed)
Will give the patient KORTLYN KOLTZ - 72 y.o. female MRN [id number]  Date of birth: 1950-12-23  Subjective Chief Complaint  Patient presents with   Weight Loss    HPI Christina Hayes is a 72 y.o. female here today for follow up visit.   She reports that she is doing well today.    Currently taking Ozempic 0.5mg  weekly.  She is doing pretty well with this.  Weight is down about 8lbs.  No significant side effects at this time.  She needs renewal through patient assistance program. Continues on crestor for management of associated HLD.  She is tolerating this well.    Remains on meloxicam daily for management of OA symptoms.  This is working well without   ROS:  A comprehensive ROS was completed and negative except as noted per HPI  Allergies  Allergen Reactions   Trazodone And Nefazodone Other (See Comments)    Nausea, fatique and sweating   Codeine Itching    *Can take Hydrocodone*   Protriptyline Palpitations   Tramadol Other (See Comments)    headache   Vivactil [Protriptyline Hcl] Palpitations    Past Medical History:  Diagnosis Date   Acid reflux    Allergic rhinitis, cause unspecified    Allergy    Anemia    years ago    Cataract    Complication of anesthesia SLOW TO WAKE   Cough    DJD (degenerative joint disease)    spine   Frequency of urination    H/O hiatal hernia    small   Head cold    Headache(784.0)    migraqine- uses essential oils   HYPERLIPIDEMIA, MIXED 12/03/2006   PT STATES NOT TAKING MED AS PRESCRIBED   MIGRAINES, HX OF 12/03/2006   Neuromuscular disorder (HCC)    Nocturia    OSA on CPAP    Osteopenia    PONV (postoperative nausea and vomiting)    in past   Shortness of breath    Sleep apnea    cpap   SUI (stress urinary incontinence, female)    Urgency of urination    VERTIGO, HX OF 12/03/2006    Past Surgical History:  Procedure Laterality Date   COLONOSCOPY     PUBOVAGINAL SLING  04/30/2011   Procedure: Gaynelle Arabian;   Surgeon: Bernestine Amass, MD;  Location: [facility];  Service: Urology;  Laterality: N/A;  sub urethral sling    possible ower   RIGID BRONCHOSCOPY N/A 04/23/2013   Procedure: LASER BRONCHOSCOPY;  Surgeon: Melrose Nakayama, MD;  Location: [place];  Service: Thoracic;  Laterality: N/A;   [place] Bilateral 03/26/2013   Procedure: VIDEO BRONCHOSCOPY WITHOUT FLUORO;  Surgeon: Tanda Rockers, MD;  Location: [facility];  Service: Cardiopulmonary;  Laterality: Bilateral;   VIDEO BRONCHOSCOPY N/A 04/23/2013   Procedure: VIDEO BRONCHOSCOPY;  Surgeon: Melrose Nakayama, MD;  Location: [place];  Service: Thoracic;  Laterality: N/A;   VIDEO BRONCHOSCOPY N/A 05/28/2014   Procedure: VIDEO BRONCHOSCOPY;  Surgeon: Melrose Nakayama, MD;  Location: [place];  Service: Thoracic;  Laterality: N/A;    Social History   Socioeconomic History   Marital status: Married    Spouse name: Alvester Chou   Number of children: 1   Years of education: 12   Highest education level: 12th grade  Occupational History   Occupation: Retired    Comment: Emergency planning/management officer  Tobacco Use   Smoking status: Never   Smokeless tobacco: Never  Vaping Use   Vaping Use: Never used  Substance and Sexual Activity   Alcohol use: No   Drug use: No   Sexual activity: Not Currently    Partners: Male    Birth control/protection: Surgical  Other Topics Concern   Not on file  Social History Narrative   Lives with her husband. She has one child. She enjoys reading and art.   Social Determinants of Health   Financial Resource Strain: Low Risk  (06/06/2021)   Overall Financial Resource Strain (CARDIA)    Difficulty of Paying Living Expenses: Not hard at all  Food Insecurity: No Food Insecurity (06/06/2021)   Hunger Vital Sign    Worried About Running Out of Food in the Last Year: Never true    Ran Out of Food in the Last Year: Never  true  Transportation Needs: No Transportation Needs (06/06/2021)   PRAPARE - Hydrologist (Medical): No    Lack of Transportation (Non-Medical): No  Physical Activity: Inactive (06/06/2021)   Exercise Vital Sign    Days of Exercise per Week: 0 days    Minutes of Exercise per Session: 40 min  Stress: No Stress Concern Present (06/06/2021)   [place]    Feeling of Stress : Not at all  Social Connections: Moderately Isolated (06/06/2021)   Social Connection and Isolation Panel [NHANES]    Frequency of Communication with Friends and Family: Twice a week    Frequency of Social Gatherings with Friends and Family: Once a week    Attends Religious Services: Never    Marine scientist or Organizations: No    Attends Music therapist: Never    Marital Status: Married    Family History  Problem Relation Age of Onset   Cancer Mother        Colon Cancer survivor   Diabetes Mother    Colon cancer Mother    Diabetes Father    Hyperlipidemia Father    Alzheimer's disease Father    Cancer Maternal Grandmother        Breast Cancer   Breast cancer Maternal Grandmother    Cancer Daughter        stage III-B breast CA   Breast cancer Daughter    Colon polyps Brother    Allergies Daughter    Esophageal cancer Neg Hx    Rectal cancer Neg Hx    Stomach cancer Neg Hx     Health Maintenance  Topic Date Due   COVID-19 Vaccine (6 - 2023-24 season) 02/22/2022 (Originally 09/22/2021)   Zoster Vaccines- Shingrix (1 of 2) 04/26/2023 (Originally 02/27/1969)   Medicare Annual Wellness (AWV)  06/08/2022   COLONOSCOPY (Pts 45-26yrs Insurance coverage will need to be confirmed)  03/06/2023   MAMMOGRAM  05/18/2023   DTaP/Tdap/Td (3 - Td or Tdap) 01/27/2024   Pneumonia Vaccine 1+ Years old  Completed   INFLUENZA VACCINE  Completed   DEXA SCAN  Completed   Hepatitis C Screening  Completed   HPV  VACCINES  Aged Out     ----------------------------------------------------------------------------------------------------------------------------------------------------------------------------------------------------------------- Physical Exam BP 124/73 (BP Location: Left Arm, Patient Position: Sitting, Cuff Size: Normal)   Pulse 82   Ht 5\' 2"  (1.575 m)   Wt 178 lb (80.7 kg)   SpO2 98%   BMI 32.56 kg/m   Physical Exam Constitutional:  Appearance: Normal appearance.  HENT:     Head: Normocephalic and atraumatic.  Eyes:     General: No scleral icterus. Cardiovascular:     Rate and Rhythm: Normal rate and regular rhythm.  Pulmonary:     Effort: Pulmonary effort is normal.     Breath sounds: Normal breath sounds.  Musculoskeletal:     Cervical back: Neck supple.  Neurological:     Mental Status: She is alert.  Psychiatric:        Mood and Affect: Mood normal.        Behavior: Behavior normal.     ------------------------------------------------------------------------------------------------------------------------------------------------------------------------------------------------------------------- Assessment and Plan  Prediabetes A1c has increased some since last visit.  Weight is down some as well with ozempic.  Tolerating well and will titrate to 1mg  weekly. F/u in 6 months.   HLD (hyperlipidemia) Tolerating crestor at current strength.  Recommend continuation.   Insomnia She reports that she continues to struggle with insomnia.  Unisom is not as effective as it was in the past.  She did pretty well with Ambien and like to try this at one half tab daily.   Meds ordered this encounter  Medications   zolpidem (AMBIEN) 5 MG tablet    Sig: Take 0.5 tablets (2.5 mg total) by mouth at bedtime as needed for sleep.    Dispense:  45 tablet    Refill:  1    No follow-ups on file.    This visit occurred during the SARS-CoV-2 public health emergency.   Safety protocols were in place, including screening questions prior to the visit, additional usage of staff PPE, and extensive cleaning of exam room while observing appropriate contact time as indicated for disinfecting solutions.

## 2022-01-25 NOTE — Assessment & Plan Note (Addendum)
A1c has increased some since last visit.  Weight is down some as well with ozempic.  Tolerating well and will titrate to 1mg  weekly. F/u in 6 months.

## 2022-01-25 NOTE — Patient Instructions (Signed)
Complete assistance paperwork, drop back off and we'll send in for 1mg  strength.   See me again in 6 months.  We'll do a full set of labs at the next visit.

## 2022-02-02 ENCOUNTER — Ambulatory Visit (INDEPENDENT_AMBULATORY_CARE_PROVIDER_SITE_OTHER): Payer: Medicare Other

## 2022-02-02 DIAGNOSIS — J309 Allergic rhinitis, unspecified: Secondary | ICD-10-CM | POA: Diagnosis not present

## 2022-02-05 ENCOUNTER — Encounter: Payer: Self-pay | Admitting: Family Medicine

## 2022-02-08 ENCOUNTER — Ambulatory Visit (INDEPENDENT_AMBULATORY_CARE_PROVIDER_SITE_OTHER): Payer: Medicare Other

## 2022-02-08 DIAGNOSIS — J309 Allergic rhinitis, unspecified: Secondary | ICD-10-CM

## 2022-02-16 ENCOUNTER — Ambulatory Visit (INDEPENDENT_AMBULATORY_CARE_PROVIDER_SITE_OTHER): Payer: Medicare Other

## 2022-02-16 DIAGNOSIS — J309 Allergic rhinitis, unspecified: Secondary | ICD-10-CM

## 2022-02-20 ENCOUNTER — Telehealth: Payer: Self-pay | Admitting: Pharmacist

## 2022-02-20 NOTE — Progress Notes (Signed)
Contacted patient regarding novonordisk paperwork for 2024 renewal. Answered patient questions. Patient verbalized understanding.  Larinda Buttery, PharmD Clinical Pharmacist [facility] At [facility] [phone]

## 2022-02-23 ENCOUNTER — Ambulatory Visit (INDEPENDENT_AMBULATORY_CARE_PROVIDER_SITE_OTHER): Payer: Medicare Other | Admitting: *Deleted

## 2022-02-23 DIAGNOSIS — J309 Allergic rhinitis, unspecified: Secondary | ICD-10-CM

## 2022-02-28 ENCOUNTER — Ambulatory Visit (INDEPENDENT_AMBULATORY_CARE_PROVIDER_SITE_OTHER): Payer: Medicare Other

## 2022-02-28 DIAGNOSIS — J309 Allergic rhinitis, unspecified: Secondary | ICD-10-CM

## 2022-03-05 ENCOUNTER — Other Ambulatory Visit: Payer: Self-pay | Admitting: *Deleted

## 2022-03-05 ENCOUNTER — Encounter: Payer: Self-pay | Admitting: Allergy

## 2022-03-05 ENCOUNTER — Other Ambulatory Visit: Payer: Self-pay

## 2022-03-05 ENCOUNTER — Encounter: Payer: Self-pay | Admitting: Family Medicine

## 2022-03-05 DIAGNOSIS — K219 Gastro-esophageal reflux disease without esophagitis: Secondary | ICD-10-CM

## 2022-03-05 MED ORDER — FUROSEMIDE 20 MG PO TABS: 20.0000 mg | ORAL_TABLET | Freq: Every day | ORAL | 3 refills | Status: DC

## 2022-03-05 MED ORDER — LEVOCETIRIZINE DIHYDROCHLORIDE 5 MG PO TABS: 5.0000 mg | ORAL_TABLET | Freq: Every evening | ORAL | 1 refills | Status: DC

## 2022-03-05 MED ORDER — PANTOPRAZOLE SODIUM 40 MG PO TBEC: 40.0000 mg | DELAYED_RELEASE_TABLET | Freq: Every day | ORAL | 1 refills | Status: DC

## 2022-03-05 MED ORDER — IPRATROPIUM BROMIDE 0.03 % NA SOLN: 2.0000 | Freq: Every day | NASAL | 3 refills | Status: DC

## 2022-03-05 MED ORDER — SUMATRIPTAN SUCCINATE 50 MG PO TABS
50.0000 mg | ORAL_TABLET | ORAL | 3 refills | Status: DC | PRN
Start: 2022-03-05 — End: 2022-06-21

## 2022-03-05 MED ORDER — ROSUVASTATIN CALCIUM 20 MG PO TABS: 20.0000 mg | ORAL_TABLET | Freq: Every day | ORAL | 1 refills | Status: DC

## 2022-03-05 MED ORDER — MONTELUKAST SODIUM 10 MG PO TABS: 10.0000 mg | ORAL_TABLET | Freq: Every day | ORAL | 1 refills | Status: DC

## 2022-03-05 NOTE — Telephone Encounter (Signed)
Completed.

## 2022-03-07 ENCOUNTER — Other Ambulatory Visit: Payer: Self-pay | Admitting: Pharmacist

## 2022-03-07 NOTE — Progress Notes (Signed)
Care Coordination  Received fax of incomplete documentation by novonordisk patient assistance program- which is requesting copy of patient insurance card.  Printed copy and faxing to [employer]ord[employer] for further application processing.   Larinda Buttery, PharmD Clinical Pharmacist [facility] Primary [facility] At [facility] [phone]

## 2022-03-08 ENCOUNTER — Encounter: Payer: Self-pay | Admitting: Family Medicine

## 2022-03-11 ENCOUNTER — Encounter: Payer: Self-pay | Admitting: Family Medicine

## 2022-03-12 ENCOUNTER — Other Ambulatory Visit: Payer: Self-pay | Admitting: Pharmacist

## 2022-03-12 NOTE — Progress Notes (Signed)
Received message from front staff [place] that patient dropped off new application portions she wanted resubmitted.  Per Philis [place], patient portion was faxed to centralized med assistance team using provided fax number in prior note of this encounter.   Will coordinate med assistance team via routing pool to engage for provider portion of patient assistance application.   Larinda Buttery, PharmD Clinical Pharmacist [facility] Primary Care At [facility] [phone]

## 2022-03-12 NOTE — Progress Notes (Signed)
Care Coordination Outreach    Contacted patient regarding novonordisk patient assistance and recent work completed for application.  Faxed novonordisk her insurance card in response to a fax received requesting this information.  Recommended patient contact novonordisk for status update at [phone].  Also informed patient that this manufacturer is about 2 months behind on processing, but if patient runs out, can obtain a sample box at PCP office.  If patient presents updated applications to resubmit, PCP office staff can fax all patient assistance work to [employer] at [phone].  Larinda Buttery, PharmD Clinical Pharmacist [facility] Primary Care At [facility] [phone]

## 2022-03-12 NOTE — Patient Instructions (Signed)
Hi Christina Hayes,  The number for novonordisk to check on status update of your application is [phone].   We are keeping sample boxes available at the office if you run out until the company processes the application.   Take care and have a great week,  Luana Shu, PharmD Clinical Pharmacist [facility] Primary Care At [facility] [phone]

## 2022-03-13 NOTE — Progress Notes (Signed)
I have faxed provider portion to [facility] office [phone] for DOSE CHANGE. PLEASE HAVE PROVIDER TO REVIEW, FILL OUT, SIGN, FAX TO [phone] ATTENTION Tawanda Schall L.    [facility] [place] [facility] Patient Advocate

## 2022-03-15 ENCOUNTER — Ambulatory Visit (INDEPENDENT_AMBULATORY_CARE_PROVIDER_SITE_OTHER): Payer: Medicare Other

## 2022-03-15 DIAGNOSIS — J309 Allergic rhinitis, unspecified: Secondary | ICD-10-CM

## 2022-03-16 ENCOUNTER — Other Ambulatory Visit: Payer: Medicare Other | Admitting: Pharmacist

## 2022-03-16 NOTE — Progress Notes (Signed)
Care Coordination Call   Provided patient outreach in response to mychart message regarding ozempic patient assistance.  Explained novonordisk forms and confusion around medicare coverage/signatures requested.  Updated patient on status, and aware that the plan is to increase dose.  A dose change request form has been faxed to PCP with fax return instructions to centralized rx med assistance team.  Explained to patient she can utilize samples until application is approved, reassured we will not let her run out of medications as long as sample availability continues.  Forwarding to rx medassistance team for Christina Hayes, PharmD Clinical Pharmacist [facility] Primary Care At [facility] [phone]

## 2022-03-28 ENCOUNTER — Telehealth: Payer: Self-pay | Admitting: Pharmacist

## 2022-03-28 NOTE — Progress Notes (Signed)
Care Coordination Call   Facilitated dose change request form for Ozempic '1mg'$  weekly (via novonordisk patient assistance) with provider in response to paperwork in physical mailbox. Faxed to [place] L with RX central med assistance team to send to novonordisk for further processing.  Larinda Buttery, PharmD Clinical Pharmacist [facility] Primary Care At [facility] [phone]

## 2022-03-29 ENCOUNTER — Ambulatory Visit (INDEPENDENT_AMBULATORY_CARE_PROVIDER_SITE_OTHER): Payer: Medicare Other

## 2022-03-29 ENCOUNTER — Telehealth: Payer: Self-pay

## 2022-03-29 DIAGNOSIS — J309 Allergic rhinitis, unspecified: Secondary | ICD-10-CM

## 2022-03-29 NOTE — Telephone Encounter (Signed)
I have resent PAP APP. For DOSE CHANGE FOR  OZEMPIC to [place] for patient assistance.   Phone: [place] [facility] Patient Advocate [phone]

## 2022-04-04 ENCOUNTER — Encounter: Payer: Self-pay | Admitting: Family Medicine

## 2022-04-11 ENCOUNTER — Other Ambulatory Visit: Payer: Self-pay | Admitting: Pharmacist

## 2022-04-11 NOTE — Telephone Encounter (Signed)
Received notification from [place] regarding approval for OZEMPIC increase in dose. Patient assistance approved from 04/02/2022 to 01/22/2023.  Phone: [phone]  Approval letter has been scanned in to media in chart  [place] [facility] Patient Advocate [phone]

## 2022-04-11 NOTE — Progress Notes (Signed)
Care Coordination   Notifying patient via mychart of ozempic dose change approval from [place] for Ozempic 1mg  weekly, through 01/22/2023.   Expect shipping of medication to PCP office in 10-14 business days.  Larinda Buttery, PharmD Clinical Pharmacist [facility] Primary Care At [facility] [phone]

## 2022-04-12 ENCOUNTER — Telehealth: Payer: Self-pay

## 2022-04-12 NOTE — Telephone Encounter (Signed)
Forwarding to [place] as an Pharmacist, hospital.  (CMA) Novo Nordisk PAP shipment for Ozempic 1 mg dose (4 boxes) received this morning. Please contact the patient to come and pick up their order today. Placed in the fridge with patient identifier. Thanks in advance.   [place]: [id number] EXP: 2024-10-21

## 2022-04-12 NOTE — Telephone Encounter (Signed)
Patient has been advised medication available for pick-up.

## 2022-04-13 ENCOUNTER — Ambulatory Visit (INDEPENDENT_AMBULATORY_CARE_PROVIDER_SITE_OTHER): Payer: Medicare Other

## 2022-04-13 DIAGNOSIS — J309 Allergic rhinitis, unspecified: Secondary | ICD-10-CM

## 2022-04-17 DIAGNOSIS — J3089 Other allergic rhinitis: Secondary | ICD-10-CM | POA: Diagnosis not present

## 2022-04-17 NOTE — Progress Notes (Signed)
VIALS EXP 04-17-23

## 2022-04-18 DIAGNOSIS — J301 Allergic rhinitis due to pollen: Secondary | ICD-10-CM

## 2022-04-24 ENCOUNTER — Other Ambulatory Visit: Payer: Self-pay | Admitting: Family Medicine

## 2022-04-24 ENCOUNTER — Ambulatory Visit (INDEPENDENT_AMBULATORY_CARE_PROVIDER_SITE_OTHER): Payer: Medicare Other

## 2022-04-24 DIAGNOSIS — J309 Allergic rhinitis, unspecified: Secondary | ICD-10-CM | POA: Diagnosis not present

## 2022-04-24 DIAGNOSIS — Z1231 Encounter for screening mammogram for malignant neoplasm of breast: Secondary | ICD-10-CM

## 2022-04-26 ENCOUNTER — Ambulatory Visit: Payer: Medicare Other | Admitting: Allergy

## 2022-05-11 ENCOUNTER — Ambulatory Visit (INDEPENDENT_AMBULATORY_CARE_PROVIDER_SITE_OTHER): Payer: Medicare Other

## 2022-05-11 DIAGNOSIS — J309 Allergic rhinitis, unspecified: Secondary | ICD-10-CM | POA: Diagnosis not present

## 2022-05-13 ENCOUNTER — Other Ambulatory Visit: Payer: Self-pay | Admitting: Family Medicine

## 2022-05-16 ENCOUNTER — Other Ambulatory Visit: Payer: Self-pay | Admitting: Family Medicine

## 2022-05-16 DIAGNOSIS — M545 Low back pain, unspecified: Secondary | ICD-10-CM

## 2022-05-18 ENCOUNTER — Encounter: Payer: Self-pay | Admitting: Family Medicine

## 2022-05-24 ENCOUNTER — Other Ambulatory Visit: Payer: Self-pay

## 2022-05-24 ENCOUNTER — Ambulatory Visit: Payer: Medicare Other | Admitting: Allergy

## 2022-05-24 VITALS — BP 112/54 | HR 64 | Resp 16 | Ht 62.6 in | Wt 176.4 lb

## 2022-05-24 DIAGNOSIS — J309 Allergic rhinitis, unspecified: Secondary | ICD-10-CM | POA: Diagnosis not present

## 2022-05-24 DIAGNOSIS — J3089 Other allergic rhinitis: Secondary | ICD-10-CM

## 2022-05-24 DIAGNOSIS — H1045 Other chronic allergic conjunctivitis: Secondary | ICD-10-CM

## 2022-05-24 MED ORDER — IPRATROPIUM BROMIDE 0.03 % NA SOLN
2.0000 | Freq: Every day | NASAL | 5 refills | Status: DC
Start: 2022-05-24 — End: 2022-05-29

## 2022-05-24 MED ORDER — EPINEPHRINE 0.3 MG/0.3ML IJ SOAJ: 0.3000 mg | INTRAMUSCULAR | 1 refills | Status: DC | PRN

## 2022-05-24 MED ORDER — LEVOCETIRIZINE DIHYDROCHLORIDE 5 MG PO TABS: 5.0000 mg | ORAL_TABLET | Freq: Every evening | ORAL | 1 refills | Status: DC

## 2022-05-24 NOTE — Progress Notes (Signed)
Follow-up Note  RE: Christina Hayes MRN: [id number] DOB: [birth year] Date of Office Visit: 05/24/2022   History of present illness: Christina Hayes is a 72 y.o. female presenting today for follow-up of allergic rhinitis with conjunctivitis.  She was last seen in the office on 07/28/2021 by myself.  She has not had any major health changes since this visit. With the onset of pollen season she has noted runny nose and sneezing a bit more.   She will use atrovent as needed for the runny nose.  She is using xyzal the day before and day of her injections for large local control otherwise is not really needing to take it on a consistent basis.  She is not needing to use any allergy based eyedrops at this time.  She is on allergen immunotherapy at maintenance dosing with plans to move from every 2 weeks to 3-week in February 2025 which would be around the 3-year mark for her.  She has noted improvement in her allergy symptoms since she has been on immunotherapy.   Review of systems: Review of Systems  Constitutional: Negative.   HENT:  Positive for rhinorrhea and sneezing.   Eyes: Negative.   Respiratory: Negative.    Cardiovascular: Negative.   Gastrointestinal: Negative.   Musculoskeletal: Negative.   Skin: Negative.   Allergic/Immunologic: Negative.   Neurological: Negative.      All other systems negative unless noted above in HPI  Past medical/social/surgical/family history have been reviewed and are unchanged unless specifically indicated below.  No changes  Medication List: Current Outpatient Medications  Medication Sig Dispense Refill   AMBULATORY NON FORMULARY MEDICATION Please provide cpap supplies: heated tubing, humidifier chamber, disposable filters, Mask/Mask frame/Head gear per patient preference 1 Units 0   EPINEPHrine 0.3 mg/0.3 mL IJ SOAJ injection Inject 0.3 mg into the muscle as needed for anaphylaxis. 1 each 1   furosemide (LASIX) 20 MG tablet Take 1 tablet (20 mg  total) by mouth daily. 30 tablet 3   ipratropium (ATROVENT) 0.03 % nasal spray Place 2 sprays into both nostrils daily. 30 mL 3   levocetirizine (XYZAL) 5 MG tablet Take 1 tablet (5 mg total) by mouth every evening. 90 tablet 1   meloxicam (MOBIC) 7.5 MG tablet TAKE 1 TABLET BY MOUTH DAILY AS  NEEDED FOR PAIN 100 tablet 2   montelukast (SINGULAIR) 10 MG tablet Take 1 tablet (10 mg total) by mouth at bedtime. 90 tablet 1   Multiple Vitamin (MULTIVITAMIN) tablet Take 1 tablet by mouth daily.     pantoprazole (PROTONIX) 40 MG tablet Take 1 tablet (40 mg total) by mouth daily. 100 tablet 1   rosuvastatin (CRESTOR) 20 MG tablet TAKE 1 TABLET BY MOUTH DAILY 100 tablet 2   SUMAtriptan (IMITREX) 50 MG tablet Take 1 tablet (50 mg total) by mouth every 2 (two) hours as needed for migraine. May repeat in 2 hours if headache persists or recurs. 10 tablet 3   tiZANidine (ZANAFLEX) 4 MG tablet Take 4 mg by mouth every 6 (six) hours as needed for muscle spasms.     triamcinolone cream (KENALOG) 0.1 % Apply 1 Application topically 2 (two) times daily. 453.6 g 3   zolpidem (AMBIEN) 5 MG tablet Take 0.5 tablets (2.5 mg total) by mouth at bedtime as needed for sleep. 45 tablet 1   Doxylamine Succinate, Sleep, (UNISOM SLEEPTABS PO) Take 50 mg by mouth at bedtime.     Semaglutide,0.25 or 0.5MG /DOS, (OZEMPIC, 0.25 OR 0.5 MG/DOSE,)  2 MG/1.5ML SOPN      No current facility-administered medications for this visit.     Known medication allergies: Allergies  Allergen Reactions   Trazodone And Nefazodone Other (See Comments)    Nausea, fatique and sweating   Codeine Itching    *Can take Hydrocodone*   Protriptyline Palpitations   Tramadol Other (See Comments)    headache   Vivactil [Protriptyline Hcl] Palpitations     Physical examination: Blood pressure (!) 112/54, pulse 64, resp. rate 16, height 5' 2.6" (1.59 m), weight 176 lb 6.4 oz (80 kg), SpO2 97 %.  General: Alert, interactive, in no acute  distress. HEENT: PERRLA, TMs pearly gray, turbinates non-edematous without discharge, post-pharynx non erythematous. Neck: Supple without lymphadenopathy. Lungs: Clear to auscultation without wheezing, rhonchi or rales. {no increased work of breathing. CV: Normal S1, S2 without murmurs. Abdomen: Nondistended, nontender. Skin: Warm and dry, without lesions or rashes. Extremities:  No clubbing, cyanosis or edema. Neuro:   Grossly intact.  Diagnositics/Labs: Immunotherapy injections provided today in office in each arm  Assessment and plan:  Allergic rhinitis with conjunctivitis with postnasal drip - doing well on allergen immunotherapy  - Continue avoidance measures for grass pollen, weed pollen, tree pollen, mold, dust mite - Continue singular 10 mg nightly - Continue Xyzal 5 mg as needed.   Continue to take day before and day of allergy injections to help decrease large local reactions (redness and big bump after injection) - Can use nasal Atrovent 2 sprays each nostril 3-4 times a day for max dosing for nasal drainage control -for watery eyes can use over-the-counter Pataday 1 drop each eye daily as needed -Continue allergen immunotherapy per protocol and have access to your epinephrine device.   Once at every 3 weeks will determine if you can discontinue or need to extend to 4-5 years.  You are almost to year 3 in 2025.    Follow-up in 12 months or sooner if needed    I appreciate the opportunity to take part in Christina Hayes's care. Please do not hesitate to contact me with questions.  Sincerely,   Margo Aye, MD Allergy/Immunology [facility]

## 2022-05-24 NOTE — Patient Instructions (Addendum)
Allergic rhinitis with conjunctivitis with postnasal drip - doing well on allergen immunotherapy  - Continue avoidance measures for grass pollen, weed pollen, tree pollen, mold, dust mite - Continue singular 10 mg nightly - Continue Xyzal 5 mg as needed.   Continue to take day before and day of allergy injections to help decrease large local reactions (redness and big bump after injection) - Can use nasal Atrovent 2 sprays each nostril 3-4 times a day for max dosing for nasal drainage control -for watery eyes can use over-the-counter Pataday 1 drop each eye daily as needed -Continue allergen immunotherapy per protocol and have access to your epinephrine device.   Once at every 3 weeks will determine if you can discontinue or need to extend to 4-5 years.  You are almost to year 3 in 2025.    Follow-up in 12 months or sooner if needed

## 2022-05-25 ENCOUNTER — Encounter: Payer: Self-pay | Admitting: Allergy

## 2022-05-29 ENCOUNTER — Encounter: Payer: Self-pay | Admitting: Allergy

## 2022-05-29 ENCOUNTER — Other Ambulatory Visit: Payer: Self-pay | Admitting: *Deleted

## 2022-05-29 MED ORDER — EPINEPHRINE 0.3 MG/0.3ML IJ SOAJ: 0.3000 mg | INTRAMUSCULAR | 1 refills | Status: DC | PRN

## 2022-05-29 MED ORDER — IPRATROPIUM BROMIDE 0.03 % NA SOLN: 2.0000 | Freq: Every day | NASAL | 1 refills | Status: DC

## 2022-06-07 ENCOUNTER — Ambulatory Visit: Payer: Medicare Other

## 2022-06-12 ENCOUNTER — Ambulatory Visit (INDEPENDENT_AMBULATORY_CARE_PROVIDER_SITE_OTHER): Payer: Medicare Other

## 2022-06-12 DIAGNOSIS — J309 Allergic rhinitis, unspecified: Secondary | ICD-10-CM | POA: Diagnosis not present

## 2022-06-13 ENCOUNTER — Ambulatory Visit (INDEPENDENT_AMBULATORY_CARE_PROVIDER_SITE_OTHER): Payer: Medicare Other | Admitting: Family Medicine

## 2022-06-13 DIAGNOSIS — Z780 Asymptomatic menopausal state: Secondary | ICD-10-CM

## 2022-06-13 DIAGNOSIS — Z0000 Encounter for general adult medical examination without abnormal findings: Secondary | ICD-10-CM | POA: Diagnosis not present

## 2022-06-13 NOTE — Progress Notes (Signed)
MEDICARE ANNUAL WELLNESS VISIT  06/13/2022  Telephone Visit Disclaimer This Medicare AWV was conducted by telephone due to national recommendations for restrictions regarding Christina COVID-19 Pandemic (e.g. social distancing).  I verified, using two identifiers, that I am speaking with Christina Hayes or their authorized healthcare agent. I discussed Christina limitations, risks, security, and privacy concerns of performing an evaluation and management service by telephone and Christina potential availability of an in-person appointment in Christina future. Christina patient expressed understanding and agreed to proceed.  Location of Patient: Home Location of Provider (nurse):  In Christina office.  Subjective:    Christina Hayes is a 72 y.o. female patient of Everrett Coombe, DO who had a Medicare Annual Wellness Visit today via telephone. Christina Hayes is Retired and lives with their spouse. she has 1 child. she reports that she is socially active and does interact with friends/family regularly. she is moderately physically active and enjoys reading and art.  Patient Care Team: Everrett Coombe, DO as PCP - General (Family Medicine)     06/13/2022    9:19 AM 06/07/2021   11:20 AM 04/26/2020    3:16 PM 03/21/2020    2:29 PM 12/30/2018   11:10 AM 12/24/2017   11:04 AM 12/28/2015    3:20 PM  Advanced Directives  Does Patient Have a Medical Advance Directive? No Yes Yes Yes Yes No Yes  Type of Advance Directive  Living will Healthcare Power of [place];Living will Healthcare Power of [place];Living will Healthcare Power of [place];Living will  Healthcare Power of [place];Living will  Does patient want to make changes to medical advance directive?  No - Patient declined  No - Patient declined No - Patient declined    Copy of Healthcare Power of Attorney in Chart?    No - copy requested No - copy requested    Would patient like information on creating a medical advance directive? No - Patient declined     No - Patient declined      Hospital Utilization Over Christina Past 12 Months: # of hospitalizations or ER visits: 0 # of surgeries: 0  Review of Systems    Patient reports that her overall health is unchanged compared to last year.  History obtained from chart review and Christina patient  Patient Reported Readings (BP, Pulse, CBG, Weight, etc) none  Pain Assessment Pain : No/denies pain     Current Medications & Allergies (verified) Allergies as of 06/13/2022       Reactions   Trazodone And Nefazodone Other (See Comments)   Nausea, fatique and sweating   Codeine Itching   *Can take Hydrocodone*   Protriptyline Palpitations   Tramadol Other (See Comments)   headache   Vivactil [protriptyline Hcl] Palpitations        Medication List        Accurate as of Jun 13, 2022  9:31 AM. If you have any questions, ask your nurse or doctor.          AMBULATORY NON FORMULARY MEDICATION Please provide cpap supplies: heated tubing, humidifier chamber, disposable filters, Mask/Mask frame/Head gear per patient preference   EPINEPHrine 0.3 mg/0.3 mL Soaj injection Commonly known as: EPI-PEN Inject 0.3 mg into Christina muscle as needed for anaphylaxis.   furosemide 20 MG tablet Commonly known as: LASIX Take 1 tablet (20 mg total) by mouth daily.   ipratropium 0.03 % nasal spray Commonly known as: ATROVENT Place 2 sprays into both nostrils daily.   levocetirizine 5 MG tablet Commonly known  asElita Boone Take 1 tablet (5 mg total) by mouth every evening.   meloxicam 7.5 MG tablet Commonly known as: MOBIC TAKE 1 TABLET BY MOUTH DAILY AS  NEEDED FOR PAIN   montelukast 10 MG tablet Commonly known as: SINGULAIR Take 1 tablet (10 mg total) by mouth at bedtime.   multivitamin tablet Take 1 tablet by mouth daily.   pantoprazole 40 MG tablet Commonly known as: PROTONIX Take 1 tablet (40 mg total) by mouth daily.   rosuvastatin 20 MG tablet Commonly known as: CRESTOR TAKE 1 TABLET BY MOUTH DAILY    SUMAtriptan 50 MG tablet Commonly known as: Imitrex Take 1 tablet (50 mg total) by mouth every 2 (two) hours as needed for migraine. May repeat in 2 hours if headache persists or recurs.   tiZANidine 4 MG tablet Commonly known as: ZANAFLEX Take 4 mg by mouth every 6 (six) hours as needed for muscle spasms.   triamcinolone cream 0.1 % Commonly known as: KENALOG Apply 1 Application topically 2 (two) times daily.   zolpidem 5 MG tablet Commonly known as: AMBIEN Take 0.5 tablets (2.5 mg total) by mouth at bedtime as needed for sleep.        History (reviewed): Past Medical History:  Diagnosis Date   Acid reflux    Allergic rhinitis, cause unspecified    Allergy    Anemia    years ago    Cataract    Complication of anesthesia SLOW TO WAKE   Cough    DJD (degenerative joint disease)    spine   Frequency of urination    H/O hiatal hernia    small   Head cold    Headache(784.0)    migraqine- uses essential oils   HYPERLIPIDEMIA, MIXED 12/03/2006   PT STATES NOT TAKING MED AS PRESCRIBED   MIGRAINES, HX OF 12/03/2006   Neuromuscular disorder (HCC)    Nocturia    OSA on CPAP    Osteopenia    PONV (postoperative nausea and vomiting)    in past   Shortness of breath    Sleep apnea    cpap   SUI (stress urinary incontinence, female)    Urgency of urination    VERTIGO, HX OF 12/03/2006   Past Surgical History:  Procedure Laterality Date   COLONOSCOPY     PUBOVAGINAL SLING  04/30/2011   Procedure: Leonides Grills;  Surgeon: Valetta Fuller, MD;  Location: [facility];  Service: Urology;  Laterality: N/A;  sub urethral sling    possible ower   RIGID BRONCHOSCOPY N/A 04/23/2013   Procedure: LASER BRONCHOSCOPY;  Surgeon: Loreli Slot, MD;  Location: [facility] OR;  Service: Thoracic;  Laterality: N/A;   SALPINGOOPHORECTOMY  1995   BILATERAL   VAGINAL HYSTERECTOMY  1988   VIDEO BRONCHOSCOPY Bilateral 03/26/2013   Procedure: VIDEO BRONCHOSCOPY  WITHOUT FLUORO;  Surgeon: Nyoka Cowden, MD;  Location: [facility];  Service: Cardiopulmonary;  Laterality: Bilateral;   VIDEO BRONCHOSCOPY N/A 04/23/2013   Procedure: VIDEO BRONCHOSCOPY;  Surgeon: Loreli Slot, MD;  Location: [facility] OR;  Service: Thoracic;  Laterality: N/A;   VIDEO BRONCHOSCOPY N/A 05/28/2014   Procedure: VIDEO BRONCHOSCOPY;  Surgeon: Loreli Slot, MD;  Location: [facility] OR;  Service: Thoracic;  Laterality: N/A;   Family History  Problem Relation Age of Onset   Cancer Mother        Colon Cancer survivor   Diabetes Mother    Colon cancer Mother    Diabetes Father  Hyperlipidemia Father    Alzheimer's disease Father    Cancer Maternal Grandmother        Breast Cancer   Breast cancer Maternal Grandmother    Cancer Daughter        stage III-B breast CA   Breast cancer Daughter    Colon polyps Brother    Allergies Daughter    Esophageal cancer Neg Hx    Rectal cancer Neg Hx    Stomach cancer Neg Hx    Social History   Socioeconomic History   Marital status: Married    Spouse name: Christina Hayes   Number of children: 1   Years of education: 12   Highest education level: 12th grade  Occupational History   Occupation: Retired    Comment: Psychologist, prison and probation services  Tobacco Use   Smoking status: Never   Smokeless tobacco: Never  Vaping Use   Vaping Use: Never used  Substance and Sexual Activity   Alcohol use: No   Drug use: No   Sexual activity: Not Currently    Partners: Male    Birth control/protection: Surgical  Other Topics Concern   Not on file  Social History Narrative   Lives with her husband. She has one child. She enjoys reading and art.   Social Determinants of Health   Financial Resource Strain: Low Risk  (06/13/2022)   Overall Financial Resource Strain (CARDIA)    Difficulty of Paying Living Expenses: Not hard at all  Food Insecurity: No Food Insecurity (06/13/2022)   Hunger Vital Sign    Worried About Running Out of Food in Christina Last Year:  Never true    Ran Out of Food in Christina Last Year: Never true  Transportation Needs: No Transportation Needs (06/13/2022)   PRAPARE - Administrator, Civil Service (Medical): No    Lack of Transportation (Non-Medical): No  Physical Activity: Inactive (06/13/2022)   Exercise Vital Sign    Days of Exercise per Week: 0 days    Minutes of Exercise per Session: 0 min  Stress: No Stress Concern Present (06/13/2022)   [employer] of Occupational Health - Occupational Stress Questionnaire    Feeling of Stress : Not at all  Social Connections: Moderately Integrated (06/13/2022)   Social Connection and Isolation Panel [NHANES]    Frequency of Communication with Friends and Family: Three times a week    Frequency of Social Gatherings with Friends and Family: Never    Attends Religious Services: 1 to 4 times per year    Active Member of [employer] or Organizations: No    Attends Banker Meetings: Never    Marital Status: Married    Activities of Daily Living    06/13/2022    9:24 AM  In your present state of health, do you have any difficulty performing Christina following activities:  Hearing? 0  Vision? 0  Difficulty concentrating or making decisions? 0  Walking or climbing stairs? 0  Dressing or bathing? 0  Doing errands, shopping? 0  Preparing Food and eating ? N  Using Christina Toilet? N  In Christina past six months, have you accidently leaked urine? N  Do you have problems with loss of bowel control? N  Managing your Medications? N  Managing your Finances? N  Housekeeping or managing your Housekeeping? N    Patient Education/ Literacy How often do you need to have someone help you when you read instructions, pamphlets, or other written materials from your doctor or pharmacy?: 1 - Never  What is Christina last grade level you completed in school?: 12th grade  Exercise Current Exercise Habits: Christina patient does not participate in regular exercise at present, Exercise limited by:  orthopedic condition(s) (back pain)  Diet Patient reports consuming  2  meals a day and 3 snack(s) a day Patient reports that her primary diet is: Regular Patient reports that she does have regular access to food.   Depression Screen    06/13/2022    9:19 AM 06/07/2021   11:17 AM 04/20/2021    2:16 PM 03/21/2020    2:30 PM 12/30/2018   11:16 AM 12/24/2017   11:08 AM 11/26/2017   10:33 AM  PHQ 2/9 Scores  PHQ - 2 Score 0 0 1 0 0 1 1  PHQ- 9 Score      6 6     Fall Risk    06/13/2022    9:19 AM 06/07/2021   11:17 AM 06/06/2021    4:25 PM 04/20/2021    2:16 PM 03/21/2020    2:30 PM  Fall Risk   Falls in Christina past year? 0 0 0 0 0  Number falls in past yr: 0 0 0 0 0  Injury with Fall? 0 0 0 0 0  Risk for fall due to : No Fall Risks No Fall Risks  No Fall Risks No Fall Risks  Follow up Falls evaluation completed Falls evaluation completed  Falls evaluation completed Falls evaluation completed     Objective:  ANJULI PLUMMER seemed alert and oriented and she participated appropriately during our telephone visit.  Blood Pressure Weight BMI  BP Readings from Last 3 Encounters:  05/24/22 (!) 112/54  01/25/22 124/73  10/23/21 113/70   Wt Readings from Last 3 Encounters:  05/24/22 176 lb 6.4 oz (80 kg)  01/25/22 178 lb (80.7 kg)  10/23/21 185 lb (83.9 kg)   BMI Readings from Last 1 Encounters:  05/24/22 31.65 kg/m    *Unable to obtain current vital signs, weight, and BMI due to telephone visit type  Hearing/Vision  Christina Hayes did not seem to have difficulty with hearing/understanding during Christina telephone conversation Reports that she has had a formal eye exam by an eye care professional within Christina past year Reports that she has not had a formal hearing evaluation within Christina past year *Unable to fully assess hearing and vision during telephone visit type  Cognitive Function:    06/13/2022    9:26 AM 06/07/2021   11:21 AM 03/21/2020    2:36 PM 12/30/2018   11:14 AM 12/24/2017   11:15  AM  6CIT Screen  What Year? 0 points 0 points 0 points 0 points 0 points  What month? 0 points 0 points 0 points 0 points 0 points  What time? 0 points 0 points 0 points 0 points 0 points  Count back from 20 0 points 0 points 0 points 0 points 0 points  Months in reverse 0 points 0 points 0 points 0 points 0 points  Repeat phrase 0 points 0 points 0 points 0 points 0 points  Total Score 0 points 0 points 0 points 0 points 0 points   (Normal:0-7, Significant for Dysfunction: >8)  Normal Cognitive Function Screening: Yes   Immunization & Health Maintenance Record Immunization History  Administered Date(s) Administered   COVID-19, mRNA, vaccine(Comirnaty)12 years and older 01/30/2022   Fluad Quad(high Dose 65+) 10/14/2019, 10/13/2020, 10/23/2021   Influenza, High Dose Seasonal PF 10/23/2016, 09/30/2018   Influenza,inj,Quad PF,6+ Mos 11/10/2015, 10/14/2017  Influenza-Unspecified 10/23/2016   PFIZER(Purple Top)SARS-COV-2 Vaccination 04/01/2019, 04/22/2019, 12/04/2019, 06/10/2020   Pfizer Covid-19 Vaccine Bivalent Booster 49yrs & up 10/31/2020   Pneumococcal Conjugate-13 07/13/2015   Pneumococcal Polysaccharide-23 03/25/2012, 05/16/2017   RSV,unspecified 12/19/2021   Td 05/20/2003   Tdap 01/26/2014   Zoster Recombinat (Shingrix) 01/30/2022, 04/03/2022   Zoster, Live 04/25/2010    Health Maintenance  Topic Date Due   COVID-19 Vaccine (7 - 2023-24 season) 06/29/2022 (Originally 03/27/2022)   INFLUENZA VACCINE  08/23/2022   COLONOSCOPY (Pts 45-10yrs Insurance coverage will need to be confirmed)  03/06/2023   MAMMOGRAM  05/18/2023   Medicare Annual Wellness (AWV)  06/13/2023   DTaP/Tdap/Td (3 - Td or Tdap) 01/27/2024   Pneumonia Vaccine 28+ Years old  Completed   DEXA SCAN  Completed   Hepatitis C Screening  Completed   Zoster Vaccines- Shingrix  Completed   HPV VACCINES  Aged Out       Assessment  This is a routine wellness examination for Christina Hayes.  Health  Maintenance: Due or Overdue There are no preventive care reminders to display for this patient.   Christina Hayes does not need a referral for Community Assistance: Care Management:   no Social Work:    no Prescription Assistance:  no Nutrition/Diabetes Education:  no   Plan:  Personalized Goals  Goals Addressed               This Visit's Progress     Patient Stated (pt-stated)        Patient stated that she would like to be able to do more around Christina house and be without any backpain.       Personalized Health Maintenance & Screening Recommendations  Screening mammography Bone densitometry screening  Lung Cancer Screening Recommended: no (Low Dose CT Chest recommended if Age 31-80 years, 30 pack-year currently smoking OR have quit w/in past 15 years) Hepatitis C Screening recommended: no HIV Screening recommended: no  Advanced Directives: Written information was not prepared per patient's request.  Referrals & Orders Orders Placed This Encounter  Procedures   DEXAScan    Follow-up Plan Follow-up with Everrett Coombe, DO as planned Medicare wellness visit in one year. Patient will access AVS on mychart.   I have personally reviewed and noted Christina following in Christina patient's chart:   Medical and social history Use of alcohol, tobacco or illicit drugs  Current medications and supplements Functional ability and status Nutritional status Physical activity Advanced directives List of other physicians Hospitalizations, surgeries, and ER visits in previous 12 months Vitals Screenings to include cognitive, depression, and falls Referrals and appointments  In addition, I have reviewed and discussed with Christina Hayes certain preventive protocols, quality metrics, and best practice recommendations. A written personalized care plan for preventive services as well as general preventive health recommendations is available and can be mailed to Christina patient at her request.       Modesto Charon, RN BSN  06/13/2022

## 2022-06-13 NOTE — Patient Instructions (Signed)
MEDICARE ANNUAL WELLNESS VISIT Health Maintenance Summary and Written Plan of Care  [name] ,  Thank you for allowing me to perform your Medicare Annual Wellness Visit and for your ongoing commitment to your health.   Health Maintenance & Immunization History Health Maintenance  Topic Date Due   COVID-19 Vaccine (7 - 2023-24 season) 06/29/2022 (Originally 03/27/2022)   INFLUENZA VACCINE  08/23/2022   COLONOSCOPY (Pts 45-41yrs Insurance coverage will need to be confirmed)  03/06/2023   MAMMOGRAM  05/18/2023   Medicare Annual Wellness (AWV)  06/13/2023   DTaP/Tdap/Td (3 - Td or Tdap) 01/27/2024   Pneumonia Vaccine 77+ Years old  Completed   DEXA SCAN  Completed   Hepatitis C Screening  Completed   Zoster Vaccines- Shingrix  Completed   HPV VACCINES  Aged Out   Immunization History  Administered Date(s) Administered   COVID-19, mRNA, vaccine(Comirnaty)12 years and older 01/30/2022   Fluad Quad(high Dose 65+) 10/14/2019, 10/13/2020, 10/23/2021   Influenza, High Dose Seasonal PF 10/23/2016, 09/30/2018   Influenza,inj,Quad PF,6+ Mos 11/10/2015, 10/14/2017   Influenza-Unspecified 10/23/2016   PFIZER(Purple Top)SARS-COV-2 Vaccination 04/01/2019, 04/22/2019, 12/04/2019, 06/10/2020   Pfizer Covid-19 Vaccine Bivalent Booster 30yrs & up 10/31/2020   Pneumococcal Conjugate-13 07/13/2015   Pneumococcal Polysaccharide-23 03/25/2012, 05/16/2017   RSV,unspecified 12/19/2021   Td 05/20/2003   Tdap 01/26/2014   Zoster Recombinat (Shingrix) 01/30/2022, 04/03/2022   Zoster, Live 04/25/2010    These are the patient goals that we discussed:  Goals Addressed               This Visit's Progress     Patient Stated (pt-stated)        Patient stated that she would like to be able to do more around the house and be without any backpain.         This is a list of Health Maintenance Items that are overdue or due now: Screening mammography Bone densitometry screening  Orders/Referrals  Placed Today: Orders Placed This Encounter  Procedures   DEXAScan    Standing Status:   Future    Standing Expiration Date:   06/13/2023    Scheduling Instructions:     Please call patient to schedule.    Order Specific Question:   Reason for exam:    Answer:   Post menopausal    Order Specific Question:   Preferred imaging location?    Answer:   [facility]   (Contact our referral department at [phone] if you have not spoken with someone about your referral appointment within the next 5 days)    Follow-up Plan Follow-up with Everrett Coombe, DO as planned Medicare wellness visit in one year. Patient will access AVS on mychart.     Health Maintenance, Female Adopting a healthy lifestyle and getting preventive care are important in promoting health and wellness. Ask your health care provider about: The right schedule for you to have regular tests and exams. Things you can do on your own to prevent diseases and keep yourself healthy. What should I know about diet, weight, and exercise? Eat a healthy diet  Eat a diet that includes plenty of vegetables, fruits, low-fat dairy products, and lean protein. Do not eat a lot of foods that are high in solid fats, added sugars, or sodium. Maintain a healthy weight Body mass index (BMI) is used to identify weight problems. It estimates body fat based on height and weight. Your health care provider can help determine your BMI and help you achieve or maintain  a healthy weight. Get regular exercise Get regular exercise. This is one of the most important things you can do for your health. Most adults should: Exercise for at least 150 minutes each week. The exercise should increase your heart rate and make you sweat (moderate-intensity exercise). Do strengthening exercises at least twice a week. This is in addition to the moderate-intensity exercise. Spend less time sitting. Even light physical activity can be beneficial. Watch  cholesterol and blood lipids Have your blood tested for lipids and cholesterol at 73 years of age, then have this test every 5 years. Have your cholesterol levels checked more often if: Your lipid or cholesterol levels are high. You are older than 73 years of age. You are at high risk for heart disease. What should I know about cancer screening? Depending on your health history and family history, you may need to have cancer screening at various ages. This may include screening for: Breast cancer. Cervical cancer. Colorectal cancer. Skin cancer. Lung cancer. What should I know about heart disease, diabetes, and high blood pressure? Blood pressure and heart disease High blood pressure causes heart disease and increases the risk of stroke. This is more likely to develop in people who have high blood pressure readings or are overweight. Have your blood pressure checked: Every 3-5 years if you are 4-32 years of age. Every year if you are 57 years old or older. Diabetes Have regular diabetes screenings. This checks your fasting blood sugar level. Have the screening done: Once every three years after age 4 if you are at a normal weight and have a low risk for diabetes. More often and at a younger age if you are overweight or have a high risk for diabetes. What should I know about preventing infection? Hepatitis B If you have a higher risk for hepatitis B, you should be screened for this virus. Talk with your health care provider to find out if you are at risk for hepatitis B infection. Hepatitis C Testing is recommended for: Everyone born from [birth year] through 1965. Anyone with known risk factors for hepatitis C. Sexually transmitted infections (STIs) Get screened for STIs, including gonorrhea and chlamydia, if: You are sexually active and are younger than 73 years of age. You are older than 73 years of age and your health care provider tells you that you are at risk for this type of  infection. Your sexual activity has changed since you were last screened, and you are at increased risk for chlamydia or gonorrhea. Ask your health care provider if you are at risk. Ask your health care provider about whether you are at high risk for HIV. Your health care provider may recommend a prescription medicine to help prevent HIV infection. If you choose to take medicine to prevent HIV, you should first get tested for HIV. You should then be tested every 3 months for as long as you are taking the medicine. Pregnancy If you are about to stop having your period (premenopausal) and you may become pregnant, seek counseling before you get pregnant. Take 400 to 800 micrograms (mcg) of folic acid every day if you become pregnant. Ask for birth control (contraception) if you want to prevent pregnancy. Osteoporosis and menopause Osteoporosis is a disease in which the bones lose minerals and strength with aging. This can result in bone fractures. If you are 29 years old or older, or if you are at risk for osteoporosis and fractures, ask your health care provider if you should: Be  screened for bone loss. Take a calcium or vitamin D supplement to lower your risk of fractures. Be given hormone replacement therapy (HRT) to treat symptoms of menopause. Follow these instructions at home: Alcohol use Do not drink alcohol if: Your health care provider tells you not to drink. You are pregnant, may be pregnant, or are planning to become pregnant. If you drink alcohol: Limit how much you have to: 0-1 drink a day. Know how much alcohol is in your drink. In the [place] one drink equals one 12 oz bottle of beer (355 mL), one 5 oz glass of wine (148 mL), or one 1 oz glass of hard liquor (44 mL). Lifestyle Do not use any products that contain nicotine or tobacco. These products include cigarettes, chewing tobacco, and vaping devices, such as e-cigarettes. If you need help quitting, ask your health care  provider. Do not use street drugs. Do not share needles. Ask your health care provider for help if you need support or information about quitting drugs. General instructions Schedule regular health, dental, and eye exams. Stay current with your vaccines. Tell your health care provider if: You often feel depressed. You have ever been abused or do not feel safe at home. Summary Adopting a healthy lifestyle and getting preventive care are important in promoting health and wellness. Follow your health care provider's instructions about healthy diet, exercising, and getting tested or screened for diseases. Follow your health care provider's instructions on monitoring your cholesterol and blood pressure. This information is not intended to replace advice given to you by your health care provider. Make sure you discuss any questions you have with your health care provider. Document Revised: 05/30/2020 Document Reviewed: 05/30/2020 Elsevier Patient Education  2023 [employer].

## 2022-06-20 ENCOUNTER — Encounter: Payer: Self-pay | Admitting: Allergy

## 2022-06-20 ENCOUNTER — Encounter: Payer: Self-pay | Admitting: Family Medicine

## 2022-06-21 ENCOUNTER — Ambulatory Visit (INDEPENDENT_AMBULATORY_CARE_PROVIDER_SITE_OTHER): Payer: Medicare Other

## 2022-06-21 DIAGNOSIS — J309 Allergic rhinitis, unspecified: Secondary | ICD-10-CM

## 2022-06-21 MED ORDER — ZOLPIDEM TARTRATE 5 MG PO TABS: 2.5000 mg | ORAL_TABLET | Freq: Every evening | ORAL | 1 refills | Status: DC | PRN

## 2022-06-21 MED ORDER — MONTELUKAST SODIUM 10 MG PO TABS: 10.0000 mg | ORAL_TABLET | Freq: Every day | ORAL | 1 refills | Status: DC

## 2022-06-21 MED ORDER — SUMATRIPTAN SUCCINATE 50 MG PO TABS: 50.0000 mg | ORAL_TABLET | ORAL | 3 refills | Status: DC | PRN

## 2022-06-25 ENCOUNTER — Other Ambulatory Visit: Payer: Self-pay

## 2022-06-25 MED ORDER — LEVOCETIRIZINE DIHYDROCHLORIDE 5 MG PO TABS: 5.0000 mg | ORAL_TABLET | Freq: Every evening | ORAL | 1 refills | Status: DC

## 2022-06-25 NOTE — Telephone Encounter (Signed)
Patient sent MyChart message regarding needing a refill. Prescription was sent in on 05/24/2022 with 90 day supply and one additional refill. Patient is stating that it shows no refills. I have called pharmacy and was informed that patient insurance overs it for 100 pills at a time. Last prescription the pharmacy filled it for 100 tablets leaving on 80 tablets left. Pharmacy stated they could fill the 80 however insurance may not pay for it as it is not 100 tablets. New prescription was sent in for 100 tablets with 1 additional refill. Patient made aware.

## 2022-06-28 ENCOUNTER — Ambulatory Visit
Admission: RE | Admit: 2022-06-28 | Discharge: 2022-06-28 | Disposition: A | Discharge status: Home / Self Care | Payer: Medicare Other | Source: Ambulatory Visit | Attending: Family Medicine | Admitting: Family Medicine

## 2022-06-28 ENCOUNTER — Ambulatory Visit (INDEPENDENT_AMBULATORY_CARE_PROVIDER_SITE_OTHER): Payer: Medicare Other

## 2022-06-28 DIAGNOSIS — J309 Allergic rhinitis, unspecified: Secondary | ICD-10-CM | POA: Diagnosis not present

## 2022-06-28 DIAGNOSIS — Z1231 Encounter for screening mammogram for malignant neoplasm of breast: Secondary | ICD-10-CM

## 2022-07-05 ENCOUNTER — Ambulatory Visit (INDEPENDENT_AMBULATORY_CARE_PROVIDER_SITE_OTHER): Payer: Medicare Other

## 2022-07-05 DIAGNOSIS — J309 Allergic rhinitis, unspecified: Secondary | ICD-10-CM

## 2022-07-11 ENCOUNTER — Ambulatory Visit (INDEPENDENT_AMBULATORY_CARE_PROVIDER_SITE_OTHER): Payer: Medicare Other

## 2022-07-11 DIAGNOSIS — M810 Age-related osteoporosis without current pathological fracture: Secondary | ICD-10-CM | POA: Diagnosis not present

## 2022-07-11 DIAGNOSIS — Z780 Asymptomatic menopausal state: Secondary | ICD-10-CM

## 2022-07-11 DIAGNOSIS — Z0000 Encounter for general adult medical examination without abnormal findings: Secondary | ICD-10-CM

## 2022-07-12 ENCOUNTER — Ambulatory Visit (INDEPENDENT_AMBULATORY_CARE_PROVIDER_SITE_OTHER): Payer: Medicare Other

## 2022-07-12 DIAGNOSIS — J309 Allergic rhinitis, unspecified: Secondary | ICD-10-CM

## 2022-07-23 ENCOUNTER — Ambulatory Visit (INDEPENDENT_AMBULATORY_CARE_PROVIDER_SITE_OTHER): Payer: Medicare Other

## 2022-07-23 DIAGNOSIS — J309 Allergic rhinitis, unspecified: Secondary | ICD-10-CM

## 2022-07-29 ENCOUNTER — Encounter: Payer: Self-pay | Admitting: Family Medicine

## 2022-07-30 ENCOUNTER — Telehealth: Payer: Self-pay

## 2022-07-30 ENCOUNTER — Encounter: Payer: Self-pay | Admitting: Family Medicine

## 2022-07-30 ENCOUNTER — Ambulatory Visit (INDEPENDENT_AMBULATORY_CARE_PROVIDER_SITE_OTHER): Payer: Medicare Other | Admitting: Family Medicine

## 2022-07-30 VITALS — BP 123/79 | HR 70 | Ht 62.6 in | Wt 172.0 lb

## 2022-07-30 DIAGNOSIS — R7303 Prediabetes: Secondary | ICD-10-CM

## 2022-07-30 DIAGNOSIS — E782 Mixed hyperlipidemia: Secondary | ICD-10-CM

## 2022-07-30 DIAGNOSIS — G43009 Migraine without aura, not intractable, without status migrainosus: Secondary | ICD-10-CM

## 2022-07-30 DIAGNOSIS — M810 Age-related osteoporosis without current pathological fracture: Secondary | ICD-10-CM

## 2022-07-30 MED ORDER — ALENDRONATE SODIUM 70 MG PO TABS: 70.0000 mg | ORAL_TABLET | ORAL | 3 refills | Status: DC

## 2022-07-30 NOTE — Assessment & Plan Note (Signed)
Doing well with Ozempic.  Updated A1c ordered.

## 2022-07-30 NOTE — Progress Notes (Signed)
   07/30/2022  Patient ID: Christina Hayes, female   DOB: [birth year], 72 y.o.   MRN: [id number]  Clinic routed request stating patient had called to speak with Lynnda Shields or pharmacist covering for her regarding Ozempic PAP renewal.  Unable to reach patient, but I left HIPAA compliant voicemail with my direct number to call back at her convenience.  Lenna Gilford, PharmD, DPLA

## 2022-07-30 NOTE — Assessment & Plan Note (Signed)
She has had increased frequency of migraines.  She would like to hold off on adding any additional medication for prevention at this time.  She will continue Imitrex as needed.

## 2022-07-30 NOTE — Patient Instructions (Addendum)
Start calcium and vitamin d supplement.  Take 1000-2000 units of vitamin d daily with 1000mg  of calcium each day.   Add fosamax for osteoporosis.   See me again in 6 months or sooner if needed.

## 2022-07-30 NOTE — Assessment & Plan Note (Signed)
Continued osteoporosis with T-score -3.2.  We discussed treatment options again and she would like to go ahead and try bisphosphonate.  Adding Fosamax weekly.  Discussed that she should sit upright for a period of at least 30 minutes after taking.

## 2022-07-30 NOTE — Progress Notes (Signed)
Christina Hayes - 72 y.o. female MRN [id number]  Date of birth: 16-Jun-1950  Subjective No chief complaint on file.   HPI Christina Hayes is a 72 y.o. female here today for follow up visit.   She reports that she is doing pretty well.   She continues on Ozempic and is doing well with this.  Weight is down to 172lbs.  No issues with tolerance at current strength.   She has had more breakthrough headaches recently.  Typically using imitrex as needed which does help but has had to use this more frequently.  Headaches are her typical headache and have not changed in intensity.    Recent DEXA with t-score of -3.2.  She is not currently on any treatment for osteoporosis.  She is on PPI.     ROS:  A comprehensive ROS was completed and negative except as noted per HPI  Allergies  Allergen Reactions   Trazodone And Nefazodone Other (See Comments)    Nausea, fatique and sweating   Codeine Itching    *Can take Hydrocodone*   Protriptyline Palpitations   Tramadol Other (See Comments)    headache   Vivactil [Protriptyline Hcl] Palpitations    Past Medical History:  Diagnosis Date   Acid reflux    Allergic rhinitis, cause unspecified    Allergy    Anemia    years ago    Cataract    Complication of anesthesia SLOW TO WAKE   Cough    DJD (degenerative joint disease)    spine   Frequency of urination    H/O hiatal hernia    small   Head cold    Headache(784.0)    migraqine- uses essential oils   HYPERLIPIDEMIA, MIXED 12/03/2006   PT STATES NOT TAKING MED AS PRESCRIBED   MIGRAINES, HX OF 12/03/2006   Neuromuscular disorder (HCC)    Nocturia    OSA on CPAP    Osteopenia    PONV (postoperative nausea and vomiting)    in past   Shortness of breath    Sleep apnea    cpap   SUI (stress urinary incontinence, female)    Urgency of urination    VERTIGO, HX OF 12/03/2006    Past Surgical History:  Procedure Laterality Date   COLONOSCOPY     PUBOVAGINAL SLING  04/30/2011    Procedure: Leonides Grills;  Surgeon: Valetta Fuller, MD;  Location: [facility];  Service: Urology;  Laterality: N/A;  sub urethral sling    possible ower   RIGID BRONCHOSCOPY N/A 04/23/2013   Procedure: LASER BRONCHOSCOPY;  Surgeon: Loreli Slot, MD;  Location: [facility] OR;  Service: Thoracic;  Laterality: N/A;   SALPINGOOPHORECTOMY  1995   BILATERAL   VAGINAL HYSTERECTOMY  1988   VIDEO BRONCHOSCOPY Bilateral 03/26/2013   Procedure: VIDEO BRONCHOSCOPY WITHOUT FLUORO;  Surgeon: Nyoka Cowden, MD;  Location: [facility];  Service: Cardiopulmonary;  Laterality: Bilateral;   VIDEO BRONCHOSCOPY N/A 04/23/2013   Procedure: VIDEO BRONCHOSCOPY;  Surgeon: Loreli Slot, MD;  Location: [facility] OR;  Service: Thoracic;  Laterality: N/A;   VIDEO BRONCHOSCOPY N/A 05/28/2014   Procedure: VIDEO BRONCHOSCOPY;  Surgeon: Loreli Slot, MD;  Location: [facility] OR;  Service: Thoracic;  Laterality: N/A;    Social History   Socioeconomic History   Marital status: Married    Spouse name: Christina Hayes   Number of children: 1   Years of education: 12   Highest education level: 12th grade  Occupational History  Occupation: Retired    Comment: Psychologist, prison and probation services  Tobacco Use   Smoking status: Never   Smokeless tobacco: Never  Vaping Use   Vaping Use: Never used  Substance and Sexual Activity   Alcohol use: No   Drug use: No   Sexual activity: Not Currently    Partners: Male    Birth control/protection: Surgical  Other Topics Concern   Not on file  Social History Narrative   Lives with her husband. She has one child. She enjoys reading and art.   Social Determinants of Health   Financial Resource Strain: Low Risk  (07/27/2022)   Overall Financial Resource Strain (CARDIA)    Difficulty of Paying Living Expenses: Not hard at all  Food Insecurity: No Food Insecurity (07/27/2022)   Hunger Vital Sign    Worried About Running Out of Food in the Last Year: Never true    Ran Out of  Food in the Last Year: Never true  Transportation Needs: No Transportation Needs (07/27/2022)   PRAPARE - Administrator, Civil Service (Medical): No    Lack of Transportation (Non-Medical): No  Physical Activity: Inactive (07/27/2022)   Exercise Vital Sign    Days of Exercise per Week: 0 days    Minutes of Exercise per Session: 0 min  Stress: No Stress Concern Present (06/13/2022)   [employer] of Occupational Health - Occupational Stress Questionnaire    Feeling of Stress : Not at all  Social Connections: Moderately Integrated (07/27/2022)   Social Connection and Isolation Panel [NHANES]    Frequency of Communication with Friends and Family: Three times a week    Frequency of Social Gatherings with Friends and Family: Once a week    Attends Religious Services: 1 to 4 times per year    Active Member of [employer] or Organizations: No    Attends Banker Meetings: Never    Marital Status: Married    Family History  Problem Relation Age of Onset   Cancer Mother        Colon Cancer survivor   Diabetes Mother    Colon cancer Mother    Diabetes Father    Hyperlipidemia Father    Alzheimer's disease Father    Cancer Maternal Grandmother        Breast Cancer   Breast cancer Maternal Grandmother    Cancer Daughter        stage III-B breast CA   Breast cancer Daughter    Colon polyps Brother    Allergies Daughter    Esophageal cancer Neg Hx    Rectal cancer Neg Hx    Stomach cancer Neg Hx     Health Maintenance  Topic Date Due   COVID-19 Vaccine (7 - 2023-24 season) 03/27/2022   INFLUENZA VACCINE  08/23/2022   Colonoscopy  03/06/2023   Medicare Annual Wellness (AWV)  07/11/2023   DTaP/Tdap/Td (3 - Td or Tdap) 01/27/2024   MAMMOGRAM  06/27/2024   Pneumonia Vaccine 64+ Years old  Completed   DEXA SCAN  Completed   Hepatitis C Screening  Completed   Zoster Vaccines- Shingrix  Completed   HPV VACCINES  Aged Out      ----------------------------------------------------------------------------------------------------------------------------------------------------------------------------------------------------------------- Physical Exam BP 123/79 (BP Location: Left Arm, Patient Position: Sitting, Cuff Size: Normal)   Pulse 70   Ht 5' 2.6" (1.59 m)   Wt 172 lb (78 kg)   SpO2 99%   BMI 30.86 kg/m   Physical Exam Constitutional:  Appearance: Normal appearance.  HENT:     Head: Normocephalic and atraumatic.  Eyes:     General: No scleral icterus. Neurological:     Mental Status: She is alert.  Psychiatric:        Mood and Affect: Mood normal.        Behavior: Behavior normal.     ------------------------------------------------------------------------------------------------------------------------------------------------------------------------------------------------------------------- Assessment and Plan  Migraine She has had increased frequency of migraines.  She would like to hold off on adding any additional medication for prevention at this time.  She will continue Imitrex as needed.  Osteoporosis Continued osteoporosis with T-score -3.2.  We discussed treatment options again and she would like to go ahead and try bisphosphonate.  Adding Fosamax weekly.  Discussed that she should sit upright for a period of at least 30 minutes after taking.  Prediabetes Doing well with Ozempic.  Updated A1c ordered.   Meds ordered this encounter  Medications   alendronate (FOSAMAX) 70 MG tablet    Sig: Take 1 tablet (70 mg total) by mouth every 7 (seven) days. Take with a full glass of water on an empty stomach.    Dispense:  12 tablet    Refill:  3    Return in about 6 months (around 01/30/2023) for F/u Prediabetes/Osteoporosis.    This visit occurred during the SARS-CoV-2 public health emergency.  Safety protocols were in place, including screening questions prior to the visit,  additional usage of staff PPE, and extensive cleaning of exam room while observing appropriate contact time as indicated for disinfecting solutions.

## 2022-08-07 ENCOUNTER — Ambulatory Visit (INDEPENDENT_AMBULATORY_CARE_PROVIDER_SITE_OTHER): Payer: Medicare Other

## 2022-08-07 ENCOUNTER — Encounter: Payer: Self-pay | Admitting: Family Medicine

## 2022-08-07 DIAGNOSIS — R7303 Prediabetes: Secondary | ICD-10-CM | POA: Diagnosis not present

## 2022-08-07 DIAGNOSIS — K219 Gastro-esophageal reflux disease without esophagitis: Secondary | ICD-10-CM

## 2022-08-07 DIAGNOSIS — M810 Age-related osteoporosis without current pathological fracture: Secondary | ICD-10-CM | POA: Diagnosis not present

## 2022-08-07 DIAGNOSIS — J309 Allergic rhinitis, unspecified: Secondary | ICD-10-CM | POA: Diagnosis not present

## 2022-08-07 DIAGNOSIS — E782 Mixed hyperlipidemia: Secondary | ICD-10-CM | POA: Diagnosis not present

## 2022-08-07 MED ORDER — FUROSEMIDE 20 MG PO TABS: 20.0000 mg | ORAL_TABLET | Freq: Every day | ORAL | 1 refills | Status: DC

## 2022-08-07 MED ORDER — SUMATRIPTAN SUCCINATE 50 MG PO TABS: 50.0000 mg | ORAL_TABLET | ORAL | 1 refills | Status: DC | PRN

## 2022-08-07 MED ORDER — PANTOPRAZOLE SODIUM 40 MG PO TBEC
40.0000 mg | DELAYED_RELEASE_TABLET | Freq: Every day | ORAL | 2 refills | Status: DC
Start: 2022-08-07 — End: 2023-02-07

## 2022-08-08 LAB — CMP14+EGFR
ALT: 19 IU/L (ref 0–32)
AST: 34 IU/L (ref 0–40)
Albumin: 4.2 g/dL (ref 3.8–4.8)
Alkaline Phosphatase: 123 IU/L — ABNORMAL HIGH (ref 44–121)
BUN/Creatinine Ratio: 14 (ref 12–28)
BUN: 13 mg/dL (ref 8–27)
Bilirubin Total: 0.5 mg/dL (ref 0.0–1.2)
CO2: 23 mmol/L (ref 20–29)
Calcium: 9.6 mg/dL (ref 8.7–10.3)
Chloride: 102 mmol/L (ref 96–106)
Creatinine, Ser: 0.92 mg/dL (ref 0.57–1.00)
Globulin, Total: 2.7 g/dL (ref 1.5–4.5)
Glucose: 100 mg/dL — ABNORMAL HIGH (ref 70–99)
Potassium: 3.9 mmol/L (ref 3.5–5.2)
Sodium: 139 mmol/L (ref 134–144)
Total Protein: 6.9 g/dL (ref 6.0–8.5)
eGFR: 66 mL/min/{1.73_m2} (ref >59)

## 2022-08-08 LAB — CBC WITH DIFFERENTIAL/PLATELET
Basophils Absolute: 0.1 10*3/uL (ref 0.0–0.2)
Basos: 1 %
EOS (ABSOLUTE): 0.3 10*3/uL (ref 0.0–0.4)
Eos: 6 %
Hematocrit: 36.5 % (ref 34.0–46.6)
Hemoglobin: 11.7 g/dL (ref 11.1–15.9)
Immature Grans (Abs): 0 10*3/uL (ref 0.0–0.1)
Immature Granulocytes: 0 %
Lymphocytes Absolute: 2 10*3/uL (ref 0.7–3.1)
Lymphs: 33 %
MCH: 26.9 pg (ref 26.6–33.0)
MCHC: 32.1 g/dL (ref 31.5–35.7)
MCV: 84 fL (ref 79–97)
Monocytes Absolute: 0.5 10*3/uL (ref 0.1–0.9)
Monocytes: 8 %
Neutrophils Absolute: 3.1 10*3/uL (ref 1.4–7.0)
Neutrophils: 52 %
Platelets: 333 10*3/uL (ref 150–450)
RBC: 4.35 x10E6/uL (ref 3.77–5.28)
RDW: 13 % (ref 11.7–15.4)
WBC: 5.9 10*3/uL (ref 3.4–10.8)

## 2022-08-08 LAB — HEMOGLOBIN A1C
Est. average glucose Bld gHb Est-mCnc: 120 mg/dL
Hgb A1c MFr Bld: 5.8 % — ABNORMAL HIGH (ref 4.8–5.6)

## 2022-08-08 LAB — LIPID PANEL WITH LDL/HDL RATIO
Cholesterol, Total: 174 mg/dL (ref 100–199)
HDL: 77 mg/dL (ref >39)
LDL Chol Calc (NIH): 82 mg/dL (ref 0–99)
LDL/HDL Ratio: 1.1 ratio (ref 0.0–3.2)
Triglycerides: 82 mg/dL (ref 0–149)
VLDL Cholesterol Cal: 15 mg/dL (ref 5–40)

## 2022-08-08 LAB — VITAMIN D 25 HYDROXY (VIT D DEFICIENCY, FRACTURES): Vit D, 25-Hydroxy: 51.1 ng/mL (ref 30.0–100.0)

## 2022-08-23 ENCOUNTER — Ambulatory Visit (INDEPENDENT_AMBULATORY_CARE_PROVIDER_SITE_OTHER): Payer: Medicare Other

## 2022-08-23 DIAGNOSIS — J309 Allergic rhinitis, unspecified: Secondary | ICD-10-CM | POA: Diagnosis not present

## 2022-08-28 ENCOUNTER — Encounter: Payer: Self-pay | Admitting: Family Medicine

## 2022-08-29 NOTE — Telephone Encounter (Signed)
Patient came by office to get 2 boxes of Ozempic, thanks.

## 2022-08-29 NOTE — Telephone Encounter (Signed)
She should be on 1mg .  CM

## 2022-09-06 ENCOUNTER — Ambulatory Visit (INDEPENDENT_AMBULATORY_CARE_PROVIDER_SITE_OTHER): Payer: Medicare Other | Admitting: *Deleted

## 2022-09-06 DIAGNOSIS — J309 Allergic rhinitis, unspecified: Secondary | ICD-10-CM

## 2022-09-06 NOTE — Progress Notes (Signed)
VIALS EXP 09-06-23

## 2022-09-07 DIAGNOSIS — J3089 Other allergic rhinitis: Secondary | ICD-10-CM | POA: Diagnosis not present

## 2022-09-10 DIAGNOSIS — J301 Allergic rhinitis due to pollen: Secondary | ICD-10-CM | POA: Diagnosis not present

## 2022-09-14 ENCOUNTER — Ambulatory Visit (INDEPENDENT_AMBULATORY_CARE_PROVIDER_SITE_OTHER): Payer: Medicare Other

## 2022-09-14 DIAGNOSIS — Z23 Encounter for immunization: Secondary | ICD-10-CM

## 2022-09-21 ENCOUNTER — Ambulatory Visit (INDEPENDENT_AMBULATORY_CARE_PROVIDER_SITE_OTHER): Payer: Medicare Other | Admitting: *Deleted

## 2022-09-21 DIAGNOSIS — J309 Allergic rhinitis, unspecified: Secondary | ICD-10-CM | POA: Diagnosis not present

## 2022-09-24 ENCOUNTER — Encounter: Payer: Self-pay | Admitting: Family Medicine

## 2022-09-25 ENCOUNTER — Telehealth: Payer: Self-pay

## 2022-09-25 NOTE — Progress Notes (Unsigned)
   09/25/2022  Patient ID: Christina Hayes, female   DOB: 04/11/1950, 72 y.o.   MRN: [id number]  Clinic routed request from [facility] inquiring about patient's next shipment of Ozempic 1mg  from [facility].  Contacted [facility], and medication is in process of shipping and should arrive in 10-14 business days.  Patient is also now enrolled in automatic refills for this medication.  Patient has been informed by the office and samples have been set aside for her.  Lenna Gilford, PharmD, DPLA

## 2022-09-27 NOTE — Telephone Encounter (Signed)
Patient informed and 2 boxes of 0.5mg  ozempic samples held in her name in refrigerator.

## 2022-10-01 ENCOUNTER — Encounter: Payer: Self-pay | Admitting: Family Medicine

## 2022-10-03 MED ORDER — ZOLPIDEM TARTRATE 5 MG PO TABS: 2.5000 mg | ORAL_TABLET | Freq: Every evening | ORAL | 1 refills | Status: DC | PRN

## 2022-10-05 ENCOUNTER — Ambulatory Visit (INDEPENDENT_AMBULATORY_CARE_PROVIDER_SITE_OTHER): Payer: Medicare Other

## 2022-10-05 DIAGNOSIS — J309 Allergic rhinitis, unspecified: Secondary | ICD-10-CM

## 2022-10-09 ENCOUNTER — Telehealth: Payer: Self-pay

## 2022-10-09 NOTE — Telephone Encounter (Signed)
Forwarding to [place] as an Financial planner.  Panya  [facility] PAP shipment for Ozempic 1 mg dose / 4 boxes received this morning. Please contact the patient to come and pick up their order today. Placed in the PAP fridge with patient identifier. Thanks in advance.   NDC: 1027-2536-64 LOT: [id number] EXP:  2025-05-21

## 2022-10-13 ENCOUNTER — Other Ambulatory Visit: Payer: Self-pay | Admitting: Family Medicine

## 2022-10-15 ENCOUNTER — Other Ambulatory Visit: Payer: Self-pay | Admitting: Family Medicine

## 2022-10-17 ENCOUNTER — Ambulatory Visit (INDEPENDENT_AMBULATORY_CARE_PROVIDER_SITE_OTHER): Payer: Medicare Other

## 2022-10-17 DIAGNOSIS — J309 Allergic rhinitis, unspecified: Secondary | ICD-10-CM

## 2022-10-21 ENCOUNTER — Encounter: Payer: Self-pay | Admitting: Family Medicine

## 2022-10-22 NOTE — Progress Notes (Signed)
10/22/2022  Patient ID: Christina Hayes, female   DOB: Aug 21, 1950, 72 y.o.   MRN: [id number]  Contacted [facility] to check on refill status of Ozempic 1mg ; medication was shipped to [facility] 9/17.  Upon chart review, this was received; but it does not appear the patient has been made aware.  Sending MyChart message and making sure provider and nurse are also aware.  Lenna Gilford, PharmD, DPLA

## 2022-10-23 NOTE — Telephone Encounter (Signed)
Patient informed. 

## 2022-11-01 ENCOUNTER — Ambulatory Visit (INDEPENDENT_AMBULATORY_CARE_PROVIDER_SITE_OTHER): Payer: Medicare Other

## 2022-11-01 DIAGNOSIS — J309 Allergic rhinitis, unspecified: Secondary | ICD-10-CM

## 2022-11-08 ENCOUNTER — Ambulatory Visit (INDEPENDENT_AMBULATORY_CARE_PROVIDER_SITE_OTHER): Payer: Medicare Other | Admitting: *Deleted

## 2022-11-08 DIAGNOSIS — J309 Allergic rhinitis, unspecified: Secondary | ICD-10-CM

## 2022-11-20 ENCOUNTER — Ambulatory Visit (INDEPENDENT_AMBULATORY_CARE_PROVIDER_SITE_OTHER): Payer: Medicare Other | Admitting: *Deleted

## 2022-11-20 DIAGNOSIS — J309 Allergic rhinitis, unspecified: Secondary | ICD-10-CM | POA: Diagnosis not present

## 2022-11-29 ENCOUNTER — Ambulatory Visit (INDEPENDENT_AMBULATORY_CARE_PROVIDER_SITE_OTHER): Payer: Medicare Other

## 2022-11-29 DIAGNOSIS — J309 Allergic rhinitis, unspecified: Secondary | ICD-10-CM | POA: Diagnosis not present

## 2022-12-14 IMAGING — DX DG LUMBAR SPINE COMPLETE 4+V
5 series · 5 of 5 positions shown · non-contrast
Comparison: June 15, 2015

CLINICAL DATA: Low back pain

EXAM:
LUMBAR SPINE - COMPLETE 4+ VIEW

[l-spine ap]
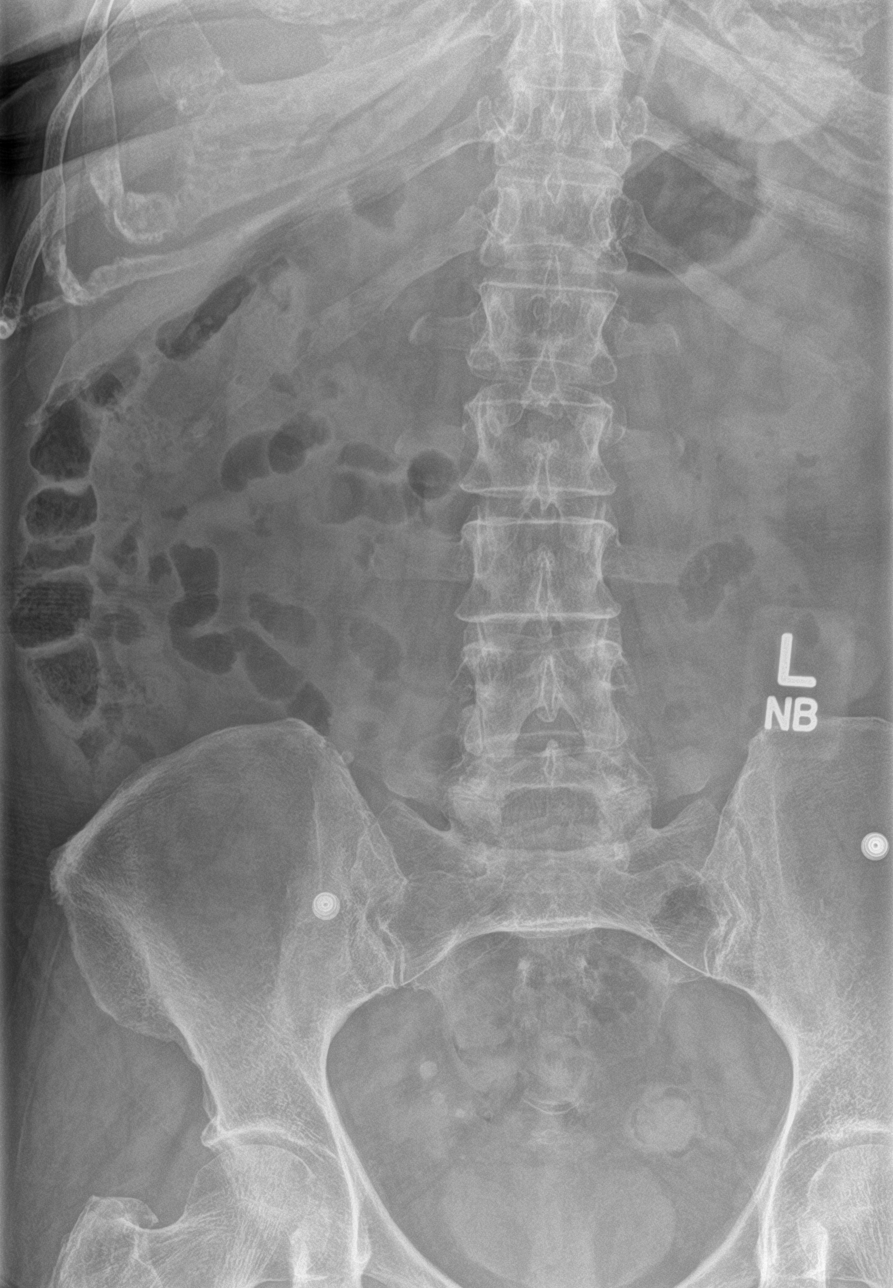

[l-spine obl (1 of 2)]
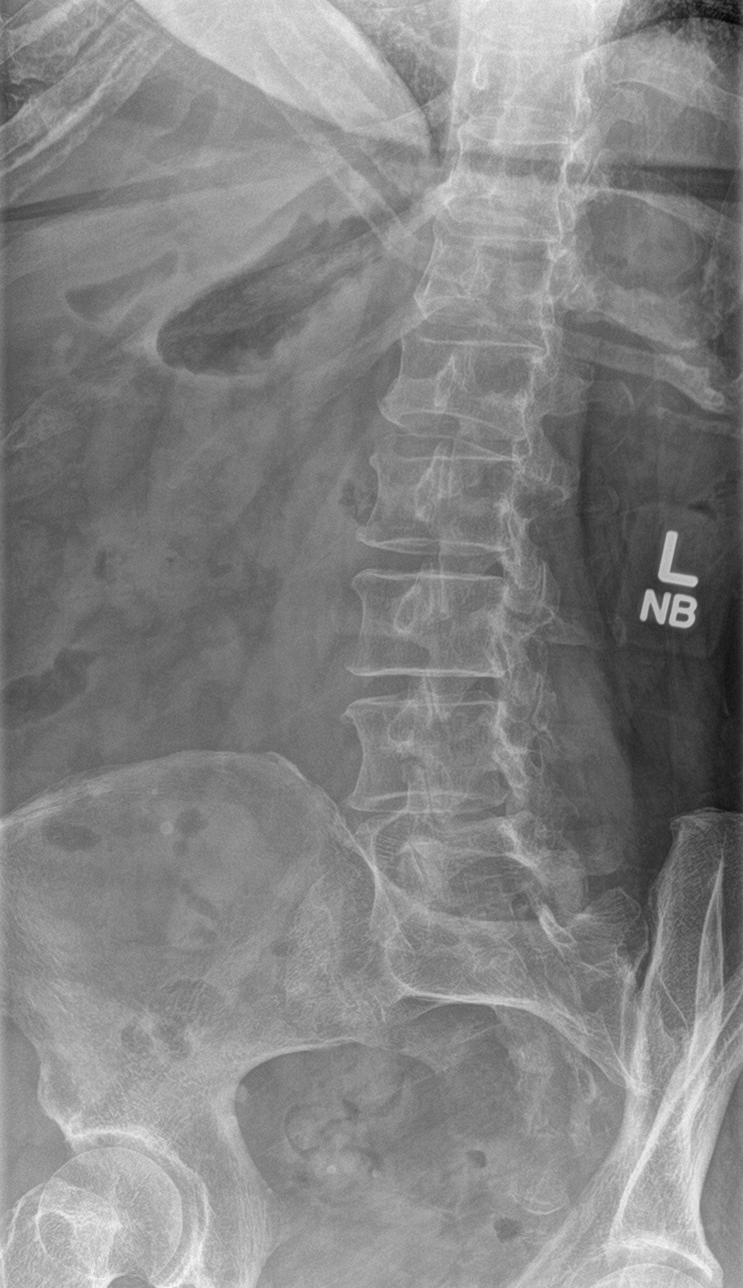

[l-spine obl (2 of 2)]
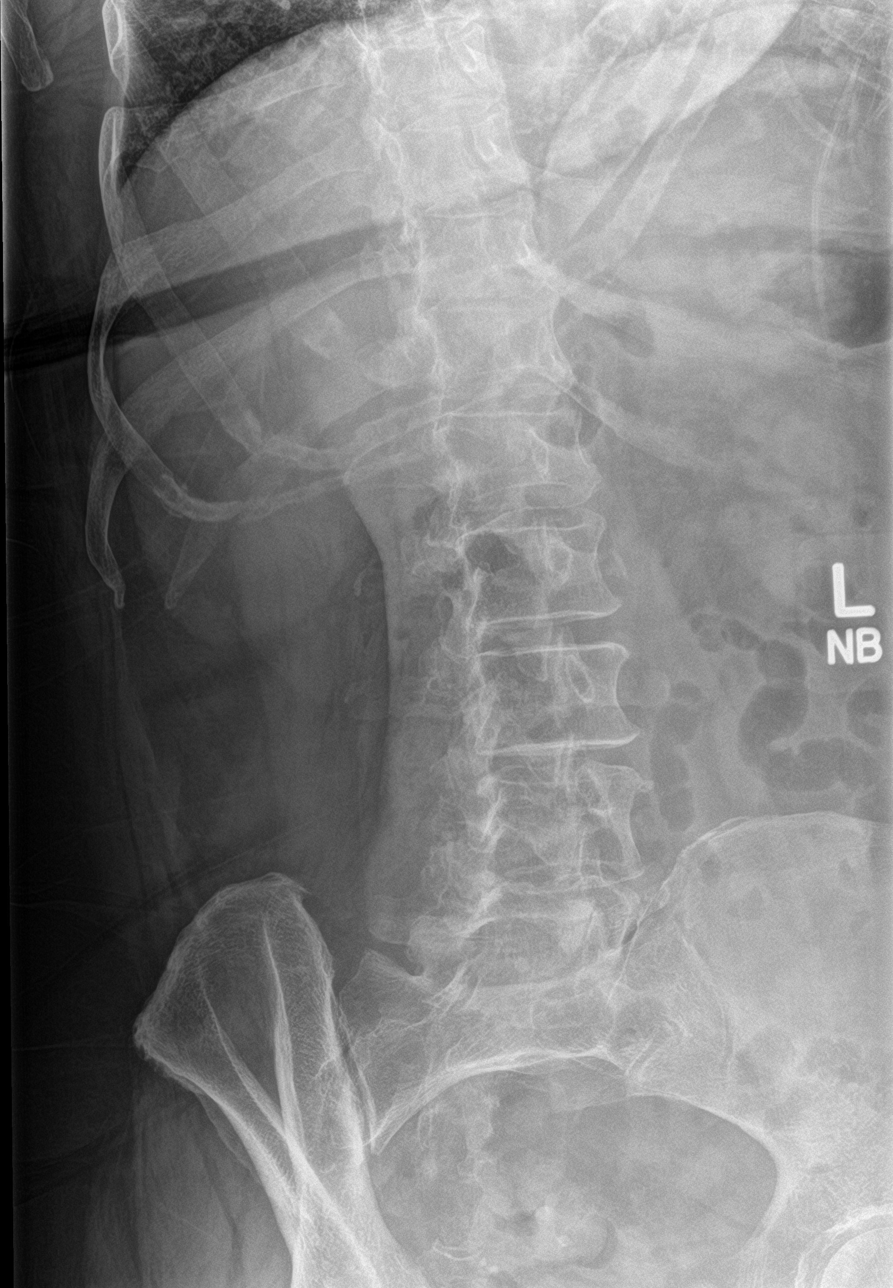

[l-spine lat]
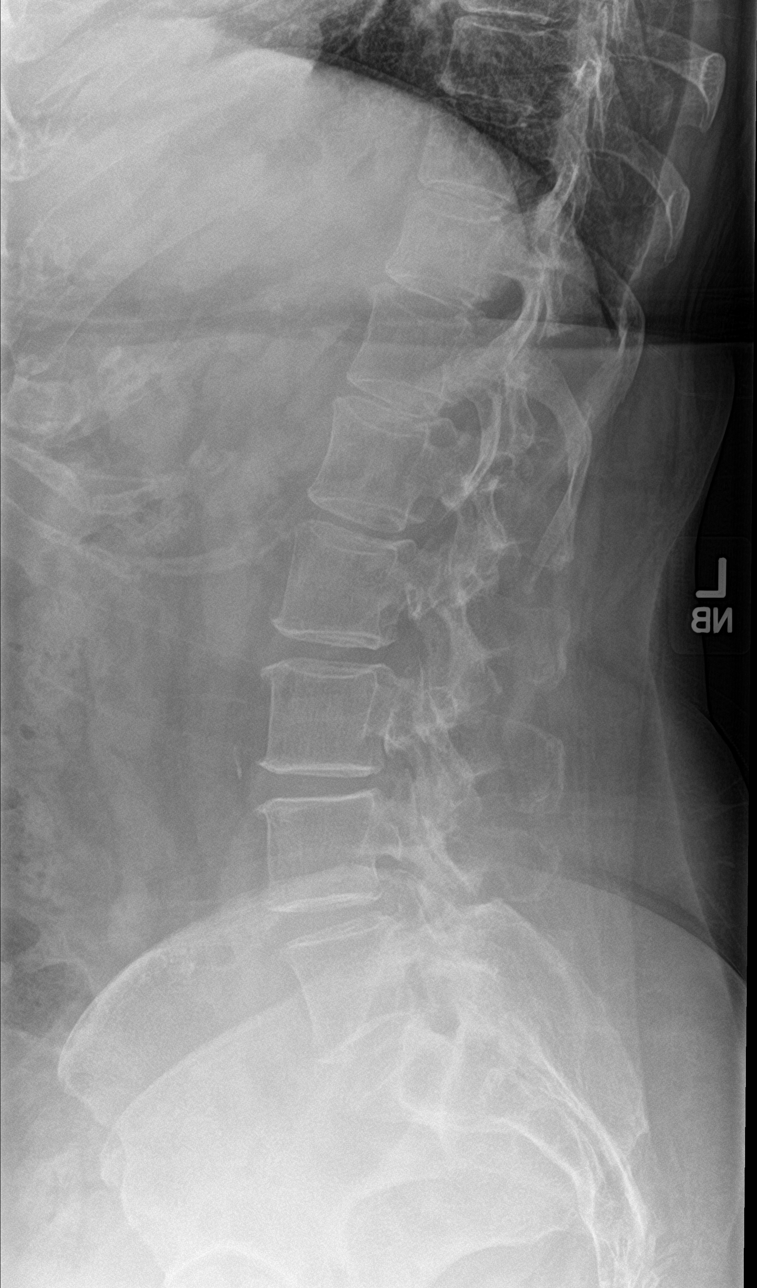

[l-spine spot]
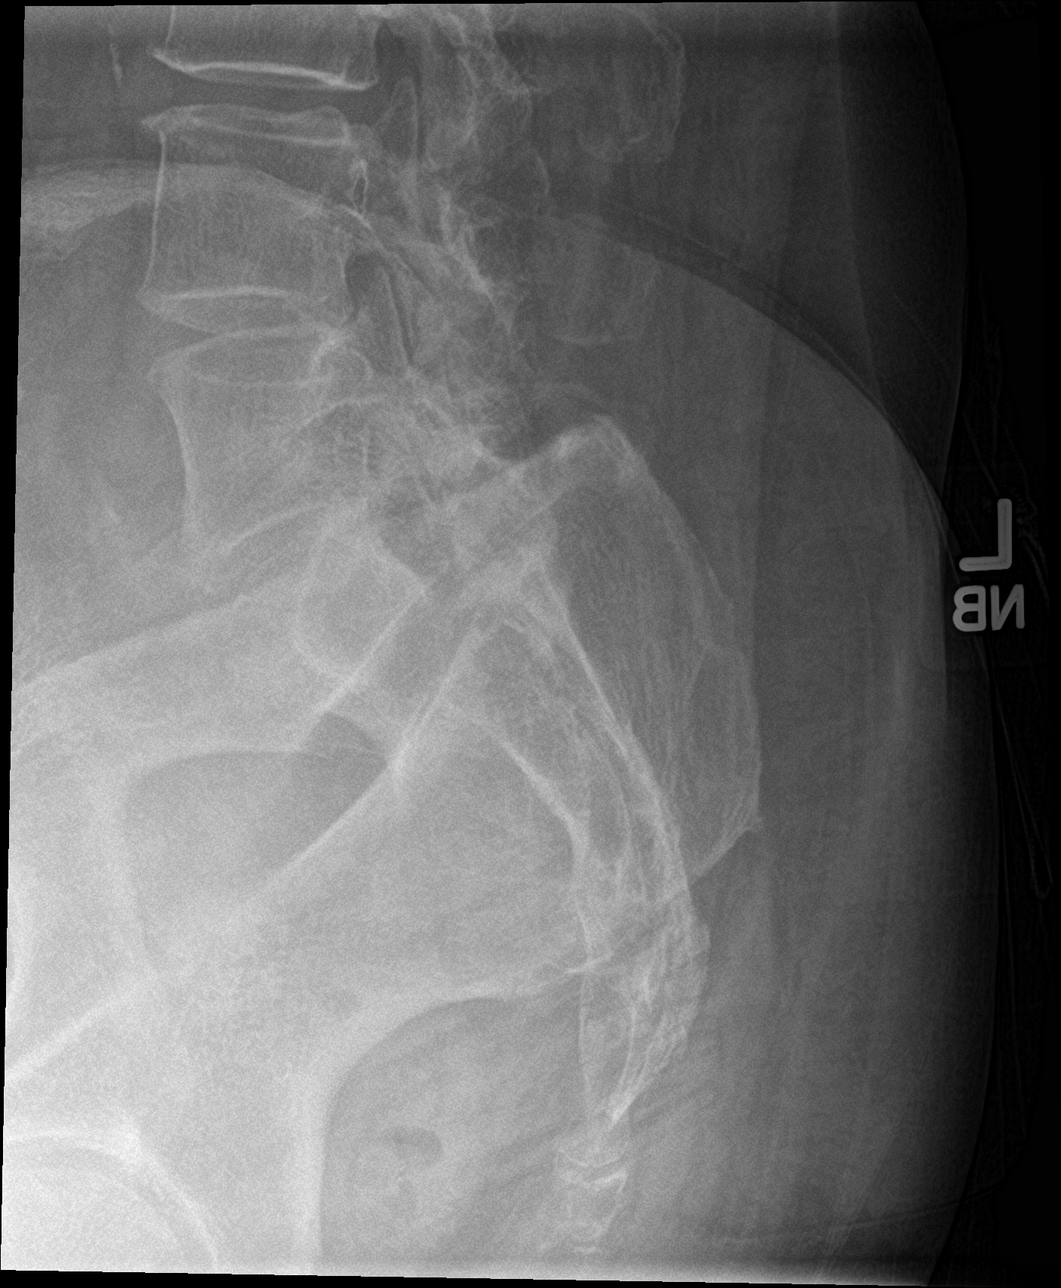

[5 of 5 positions shown; findings below may reference images not displayed]

FINDINGS: Frontal, lateral, spot lumbosacral lateral, and bilateral oblique
views were obtained. There are 5 non-rib-bearing lumbar type
vertebral bodies. There is lumbar dextroscoliosis. There is no
fracture or spondylolisthesis. There is slight disc space narrowing
at L4-5 and L5-S1. Other disc spaces appear unremarkable. There is
facet osteoarthritic change at L3-4 on the right, at L4-5
bilaterally, and at L5-S1 bilaterally
IMPRESSION: Mild scoliosis. Osteoarthritic change in the facets at L3-4 on the
right,, L4-5 and L5-S1 bilaterally as well as mild disc space
narrowing at L4-5 and L5-S1. No fracture or spondylolisthesis.

## 2022-12-27 ENCOUNTER — Ambulatory Visit (INDEPENDENT_AMBULATORY_CARE_PROVIDER_SITE_OTHER): Payer: Medicare Other | Admitting: *Deleted

## 2022-12-27 DIAGNOSIS — J309 Allergic rhinitis, unspecified: Secondary | ICD-10-CM

## 2023-01-15 NOTE — Progress Notes (Signed)
   01/15/2023  Patient ID: Christina Hayes, female   DOB: Feb 02, 1950, 72 y.o.   MRN: [id number]  Submitted 2025 re-enrollment application online for Ozempic 1mg  through [facility] [facility].  Lenna Gilford, PharmD, DPLA

## 2023-01-28 ENCOUNTER — Other Ambulatory Visit: Payer: Self-pay | Admitting: Family Medicine

## 2023-01-28 DIAGNOSIS — M545 Low back pain, unspecified: Secondary | ICD-10-CM

## 2023-01-30 ENCOUNTER — Ambulatory Visit: Payer: Medicare Other | Admitting: Family Medicine

## 2023-01-31 ENCOUNTER — Ambulatory Visit (INDEPENDENT_AMBULATORY_CARE_PROVIDER_SITE_OTHER): Payer: Medicare Other

## 2023-01-31 DIAGNOSIS — J309 Allergic rhinitis, unspecified: Secondary | ICD-10-CM | POA: Diagnosis not present

## 2023-02-07 ENCOUNTER — Encounter: Payer: Self-pay | Admitting: Family Medicine

## 2023-02-07 ENCOUNTER — Ambulatory Visit (INDEPENDENT_AMBULATORY_CARE_PROVIDER_SITE_OTHER): Payer: Medicare Other | Admitting: Family Medicine

## 2023-02-07 VITALS — BP 137/84 | HR 94 | Ht 62.6 in | Wt 291.0 lb

## 2023-02-07 DIAGNOSIS — F5101 Primary insomnia: Secondary | ICD-10-CM

## 2023-02-07 DIAGNOSIS — R7303 Prediabetes: Secondary | ICD-10-CM | POA: Diagnosis not present

## 2023-02-07 DIAGNOSIS — M810 Age-related osteoporosis without current pathological fracture: Secondary | ICD-10-CM | POA: Diagnosis not present

## 2023-02-07 DIAGNOSIS — E782 Mixed hyperlipidemia: Secondary | ICD-10-CM

## 2023-02-07 DIAGNOSIS — K219 Gastro-esophageal reflux disease without esophagitis: Secondary | ICD-10-CM | POA: Diagnosis not present

## 2023-02-07 LAB — POCT GLYCOSYLATED HEMOGLOBIN (HGB A1C): HbA1c, POC (prediabetic range): 5.5 % — AB (ref 5.7–6.4)

## 2023-02-07 MED ORDER — PANTOPRAZOLE SODIUM 40 MG PO TBEC: 40.0000 mg | DELAYED_RELEASE_TABLET | Freq: Every day | ORAL | 2 refills | Status: DC

## 2023-02-07 MED ORDER — SUMATRIPTAN SUCCINATE 50 MG PO TABS: 50.0000 mg | ORAL_TABLET | ORAL | 1 refills | Status: DC | PRN

## 2023-02-07 MED ORDER — FUROSEMIDE 20 MG PO TABS: 20.0000 mg | ORAL_TABLET | Freq: Every day | ORAL | 2 refills | Status: DC

## 2023-02-07 MED ORDER — ROSUVASTATIN CALCIUM 20 MG PO TABS: 20.0000 mg | ORAL_TABLET | Freq: Every day | ORAL | 2 refills | Status: DC

## 2023-02-07 MED ORDER — MONTELUKAST SODIUM 10 MG PO TABS: 10.0000 mg | ORAL_TABLET | Freq: Every day | ORAL | 2 refills | Status: DC

## 2023-02-07 MED ORDER — ALENDRONATE SODIUM 70 MG PO TABS: 70.0000 mg | ORAL_TABLET | ORAL | 3 refills | Status: DC

## 2023-02-07 NOTE — Progress Notes (Signed)
Christina Hayes - 73 y.o. female MRN [id number]  Date of birth: [birth year]  Subjective Chief Complaint  Patient presents with   Medical Management of Chronic Issues    HPI Christina Hayes is a 73 y.o. female here today for follow up visit.   She reports that she is doing pretty well.   She remains on Ozempic for management of diabetes.  She is doing well with this at current strength.  A1c is 5.5%.   Tolerating crestor well for management of HLD  Remains on fosamax for management of osteoporosis.  She is doing well with this.    Sleep is stable with low strength ambien as needed.   ROS:  A comprehensive ROS was completed and negative except as noted per HPI  Allergies  Allergen Reactions   Trazodone And Nefazodone Other (See Comments)    Nausea, fatique and sweating   Codeine Itching    *Can take Hydrocodone*   Protriptyline Palpitations   Tramadol Other (See Comments)    headache   Vivactil [Protriptyline Hcl] Palpitations    Past Medical History:  Diagnosis Date   Acid reflux    Allergic rhinitis, cause unspecified    Allergy    Anemia    years ago    Cataract    Complication of anesthesia SLOW TO WAKE   Cough    DJD (degenerative joint disease)    spine   Frequency of urination    H/O hiatal hernia    small   Head cold    Headache(784.0)    migraqine- uses essential oils   HYPERLIPIDEMIA, MIXED 12/03/2006   PT STATES NOT TAKING MED AS PRESCRIBED   MIGRAINES, HX OF 12/03/2006   Neuromuscular disorder (HCC)    Nocturia    OSA on CPAP    Osteopenia    PONV (postoperative nausea and vomiting)    in past   Shortness of breath    Sleep apnea    cpap   SUI (stress urinary incontinence, female)    Urgency of urination    VERTIGO, HX OF 12/03/2006    Past Surgical History:  Procedure Laterality Date   COLONOSCOPY     PUBOVAGINAL SLING  04/30/2011   Procedure: Leonides Grills;  Surgeon: Valetta Fuller, MD;  Location: [facility];   Service: Urology;  Laterality: N/A;  sub urethral sling    possible ower   RIGID BRONCHOSCOPY N/A 04/23/2013   Procedure: LASER BRONCHOSCOPY;  Surgeon: Loreli Slot, MD;  Location: [facility] OR;  Service: Thoracic;  Laterality: N/A;   SALPINGOOPHORECTOMY  1995   BILATERAL   VAGINAL HYSTERECTOMY  1988   VIDEO BRONCHOSCOPY Bilateral 03/26/2013   Procedure: VIDEO BRONCHOSCOPY WITHOUT FLUORO;  Surgeon: Nyoka Cowden, MD;  Location: [facility];  Service: Cardiopulmonary;  Laterality: Bilateral;   VIDEO BRONCHOSCOPY N/A 04/23/2013   Procedure: VIDEO BRONCHOSCOPY;  Surgeon: Loreli Slot, MD;  Location: [facility] OR;  Service: Thoracic;  Laterality: N/A;   VIDEO BRONCHOSCOPY N/A 05/28/2014   Procedure: VIDEO BRONCHOSCOPY;  Surgeon: Loreli Slot, MD;  Location: [facility] OR;  Service: Thoracic;  Laterality: N/A;    Social History   Socioeconomic History   Marital status: Married    Spouse name: Gery Pray   Number of children: 1   Years of education: 12   Highest education level: 12th grade  Occupational History   Occupation: Retired    Comment: Psychologist, prison and probation services  Tobacco Use   Smoking status: Never   Smokeless  tobacco: Never  Vaping Use   Vaping status: Never Used  Substance and Sexual Activity   Alcohol use: No   Drug use: No   Sexual activity: Not Currently    Partners: Male    Birth control/protection: Surgical  Other Topics Concern   Not on file  Social History Narrative   Lives with her husband. She has one child. She enjoys reading and art.   Social Drivers of Corporate investment banker Strain: Low Risk  (07/27/2022)   Overall Financial Resource Strain (CARDIA)    Difficulty of Paying Living Expenses: Not hard at all  Food Insecurity: No Food Insecurity (07/27/2022)   Hunger Vital Sign    Worried About Running Out of Food in the Last Year: Never true    Ran Out of Food in the Last Year: Never true  Transportation Needs: No Transportation Needs (07/27/2022)   PRAPARE  - Administrator, Civil Service (Medical): No    Lack of Transportation (Non-Medical): No  Physical Activity: Inactive (07/27/2022)   Exercise Vital Sign    Days of Exercise per Week: 0 days    Minutes of Exercise per Session: 0 min  Stress: No Stress Concern Present (06/13/2022)   [employer] of Occupational Health - Occupational Stress Questionnaire    Feeling of Stress : Not at all  Social Connections: Moderately Integrated (07/27/2022)   Social Connection and Isolation Panel [NHANES]    Frequency of Communication with Friends and Family: Three times a week    Frequency of Social Gatherings with Friends and Family: Once a week    Attends Religious Services: 1 to 4 times per year    Active Member of [employer] or Organizations: No    Attends Banker Meetings: Never    Marital Status: Married    Family History  Problem Relation Age of Onset   Cancer Mother        Colon Cancer survivor   Diabetes Mother    Colon cancer Mother    Diabetes Father    Hyperlipidemia Father    Alzheimer's disease Father    Cancer Maternal Grandmother        Breast Cancer   Breast cancer Maternal Grandmother    Cancer Daughter 104       stage III-B breast CA   Breast cancer Daughter    Colon polyps Brother    Allergies Daughter    Esophageal cancer Neg Hx    Rectal cancer Neg Hx    Stomach cancer Neg Hx     Health Maintenance  Topic Date Due   COVID-19 Vaccine (7 - 2024-25 season) 09/23/2022   Colonoscopy  03/06/2023   Medicare Annual Wellness (AWV)  07/11/2023   DTaP/Tdap/Td (3 - Td or Tdap) 01/27/2024   MAMMOGRAM  06/27/2024   Pneumonia Vaccine 49+ Years old  Completed   INFLUENZA VACCINE  Completed   DEXA SCAN  Completed   Hepatitis C Screening  Completed   Zoster Vaccines- Shingrix  Completed   HPV VACCINES  Aged Out      ----------------------------------------------------------------------------------------------------------------------------------------------------------------------------------------------------------------- Physical Exam BP 137/84 (BP Location: Left Arm, Patient Position: Sitting, Cuff Size: Normal)   Pulse 94   Ht 5' 2.6" (1.59 m)   Wt 291 lb (132 kg)   SpO2 95%   BMI 52.21 kg/m   Physical Exam Constitutional:      Appearance: Normal appearance.  Eyes:     General: No scleral icterus. Cardiovascular:     Rate  and Rhythm: Normal rate and regular rhythm.  Pulmonary:     Effort: Pulmonary effort is normal.     Breath sounds: Normal breath sounds.  Musculoskeletal:     Cervical back: Neck supple.  Neurological:     Mental Status: She is alert.  Psychiatric:        Mood and Affect: Mood normal.        Behavior: Behavior normal.     ------------------------------------------------------------------------------------------------------------------------------------------------------------------------------------------------------------------- Assessment and Plan  Prediabetes Doing well with Ozempic.  Awaiting shipment from patient assistance. A1c improved to 5.5% today.    Osteoporosis Continue fosamax at current strength.    HLD (hyperlipidemia) Tolerating crestor at current strength.  Recommend continuation.   Insomnia Doing pretty well with ambien at current strength.  Will plan to continue.    No orders of the defined types were placed in this encounter.   No follow-ups on file.    This visit occurred during the SARS-CoV-2 public health emergency.  Safety protocols were in place, including screening questions prior to the visit, additional usage of staff PPE, and extensive cleaning of exam room while observing appropriate contact time as indicated for disinfecting solutions.

## 2023-02-07 NOTE — Assessment & Plan Note (Signed)
Doing pretty well with ambien at current strength.  Will plan to continue.

## 2023-02-07 NOTE — Assessment & Plan Note (Signed)
Doing well with Ozempic.  Awaiting shipment from patient assistance. A1c improved to 5.5% today.

## 2023-02-07 NOTE — Assessment & Plan Note (Signed)
Continue fosamax at current strength.

## 2023-02-07 NOTE — Addendum Note (Signed)
Addended by: Ardyth Man on: 02/07/2023 03:07 PM   Modules accepted: Orders

## 2023-02-07 NOTE — Assessment & Plan Note (Signed)
Tolerating crestor at current strength.  Recommend continuation.

## 2023-03-01 ENCOUNTER — Ambulatory Visit (INDEPENDENT_AMBULATORY_CARE_PROVIDER_SITE_OTHER): Payer: Medicare Other | Admitting: *Deleted

## 2023-03-01 DIAGNOSIS — J309 Allergic rhinitis, unspecified: Secondary | ICD-10-CM

## 2023-03-03 ENCOUNTER — Encounter: Payer: Self-pay | Admitting: Family Medicine

## 2023-03-04 NOTE — Progress Notes (Signed)
   03/04/2023  Patient ID: Christina Hayes, female   DOB: November 17, 1950, 73 y.o.   MRN: [id number]  Dr. Augustus Ledger office forward is a message to me from the patient inquiring about when she might can expect a delivery to arrive for her Ozempic  1 mg supplied through [facility] P[facility].  Patient was approved and prescription went into processing 01/23/2023; but due to delays in deliveries, her medications still has not shipped.  [facility] was able to provide a 30-day voucher to cover a 1 month supply at the patient's preferred pharmacy.  Sending patient a MyChart message to let her know and see which pharmacy she would like a prescription sent to.  30 day voucher information:  [id number]  Linn Rich, PharmD, DPLA

## 2023-03-12 ENCOUNTER — Other Ambulatory Visit: Payer: Self-pay

## 2023-03-12 MED ORDER — OZEMPIC (1 MG/DOSE) 4 MG/3ML ~~LOC~~ SOPN: 1.0000 mg | PEN_INJECTOR | SUBCUTANEOUS | 0 refills | Status: DC

## 2023-03-12 NOTE — Progress Notes (Signed)
   03/12/2023  Patient ID: Christina Hayes, female   DOB: [birth year], 73 y.o.   MRN: [id number]  In basket message from patient stating she would like free 30-day supply sent to [facility] in [place] on [address].  Order for Ozempic 1 mg weekly along with free 30-day voucher billing information pending for Dr. Ashley Royalty to sign if in agreement.  Lenna Gilford, PharmD, DPLA

## 2023-03-29 ENCOUNTER — Ambulatory Visit (INDEPENDENT_AMBULATORY_CARE_PROVIDER_SITE_OTHER): Payer: Self-pay | Admitting: *Deleted

## 2023-03-29 DIAGNOSIS — J309 Allergic rhinitis, unspecified: Secondary | ICD-10-CM

## 2023-04-04 ENCOUNTER — Telehealth: Payer: Self-pay

## 2023-04-04 NOTE — Telephone Encounter (Signed)
 Forwarding to [place] as an Financial planner.  Panya  [employer] PAP shipment for Ozempic 1 mg dose / 4 boxes received this morning. Please contact the patient to come and pick up their order today. Placed in the PAP fridge with patient identifier. Thanks in advance.   NDC: [id number] LOT: [id number] EXP: 2025-10-21

## 2023-04-04 NOTE — Telephone Encounter (Signed)
 Pt has been advised of Ozempic shipment.

## 2023-04-06 ENCOUNTER — Other Ambulatory Visit: Payer: Self-pay | Admitting: Family Medicine

## 2023-04-16 ENCOUNTER — Telehealth: Payer: Self-pay

## 2023-04-16 ENCOUNTER — Other Ambulatory Visit (HOSPITAL_COMMUNITY): Payer: Self-pay

## 2023-04-16 NOTE — Telephone Encounter (Signed)
 Pharmacy Patient Advocate Encounter   Received notification from Onbase that prior authorization for Ozempic (1 MG/DOSE) 4MG /3ML pen-injectors is required/requested.   Insurance verification completed.   The patient is insured through [facility] .   Per test claim: PA required; PA submitted to above mentioned insurance via CoverMyMeds Key/confirmation #/EOC [id number] Status is pending

## 2023-04-16 NOTE — Telephone Encounter (Signed)
 Pharmacy Patient Advocate Encounter  Received notification from [facility] that Prior Authorization for Ozempic (1 MG/DOSE) 4MG /3ML pen-injectors has been DENIED.  See denial reason below. No denial letter attached in CMM. Will attach denial letter to Media tab once received.   PA #/Case ID/Reference #: [id number]

## 2023-04-26 ENCOUNTER — Ambulatory Visit (INDEPENDENT_AMBULATORY_CARE_PROVIDER_SITE_OTHER)

## 2023-04-26 DIAGNOSIS — J309 Allergic rhinitis, unspecified: Secondary | ICD-10-CM

## 2023-04-29 DIAGNOSIS — J3089 Other allergic rhinitis: Secondary | ICD-10-CM | POA: Diagnosis not present

## 2023-04-29 NOTE — Progress Notes (Signed)
 VIALS MADE 04-29-23. EXP 04-28-24

## 2023-04-30 DIAGNOSIS — J301 Allergic rhinitis due to pollen: Secondary | ICD-10-CM | POA: Diagnosis not present

## 2023-05-08 ENCOUNTER — Encounter: Payer: Self-pay | Admitting: Family Medicine

## 2023-05-08 ENCOUNTER — Ambulatory Visit (INDEPENDENT_AMBULATORY_CARE_PROVIDER_SITE_OTHER): Payer: Medicare Other | Admitting: Family Medicine

## 2023-05-08 VITALS — BP 115/70 | HR 83 | Ht 62.6 in | Wt 167.0 lb

## 2023-05-08 DIAGNOSIS — R7303 Prediabetes: Secondary | ICD-10-CM | POA: Diagnosis not present

## 2023-05-08 DIAGNOSIS — M7061 Trochanteric bursitis, right hip: Secondary | ICD-10-CM

## 2023-05-08 DIAGNOSIS — G4720 Circadian rhythm sleep disorder, unspecified type: Secondary | ICD-10-CM | POA: Diagnosis not present

## 2023-05-08 DIAGNOSIS — M7060 Trochanteric bursitis, unspecified hip: Secondary | ICD-10-CM | POA: Insufficient documentation

## 2023-05-08 LAB — POCT GLYCOSYLATED HEMOGLOBIN (HGB A1C): HbA1c, POC (prediabetic range): 5.5 % — AB (ref 5.7–6.4)

## 2023-05-08 MED ORDER — ZOLPIDEM TARTRATE 5 MG PO TABS: 5.0000 mg | ORAL_TABLET | Freq: Every evening | ORAL | 1 refills | Status: DC | PRN

## 2023-05-08 NOTE — Progress Notes (Signed)
 Christina Hayes - 73 y.o. female MRN [id number]  Date of birth: 1950/01/29  Subjective Chief Complaint  Patient presents with   Prediabetes   Generalized Body Aches   Gas   Diarrhea   Insomnia    HPI .Christina Hayes is a 73 y.o. female here today for follow up visit.   She continues on Ozempic, currently at 1mg /week.  She has experienced increased gas/bloating and occasional fecal urgency. She is not sure if this is related to Ozempic.  Her blood sugars and weight has remained under good control.  She admits fiber intake could be better.  Blood sugar remains well-controlled with A1c of 5.5%.  She does have history of insomnia currently treated with Ambien 2.5 mg nightly.  She reports that this is an effective.  5 mg seems to work better for her.  She is having some bilateral hip pain.  Pain is along the lateral portion of the hips.  No radiation down the legs.  No numbness or tingling.  ROS:  A comprehensive ROS was completed and negative except as noted per HPI   Allergies  Allergen Reactions   Trazodone And Nefazodone Other (See Comments)    Nausea, fatique and sweating   Codeine Itching    *Can take Hydrocodone*   Protriptyline Palpitations   Tramadol Other (See Comments)    headache   Vivactil [Protriptyline Hcl] Palpitations    Past Medical History:  Diagnosis Date   Acid reflux    Allergic rhinitis, cause unspecified    Allergy    Anemia    years ago    Cataract    Complication of anesthesia SLOW TO WAKE   Cough    DJD (degenerative joint disease)    spine   Frequency of urination    H/O hiatal hernia    small   Head cold    Headache(784.0)    migraqine- uses essential oils   HYPERLIPIDEMIA, MIXED 12/03/2006   PT STATES NOT TAKING MED AS PRESCRIBED   MIGRAINES, HX OF 12/03/2006   Neuromuscular disorder (HCC)    Nocturia    OSA on CPAP    Osteopenia    PONV (postoperative nausea and vomiting)    in past   Shortness of breath    Sleep apnea    cpap    SUI (stress urinary incontinence, female)    Urgency of urination    VERTIGO, HX OF 12/03/2006    Past Surgical History:  Procedure Laterality Date   COLONOSCOPY     PUBOVAGINAL SLING  04/30/2011   Procedure: Leonides Grills;  Surgeon: Valetta Fuller, MD;  [facility];  Service: Urology;  Laterality: N/A;  sub urethral sling    possible ower   RIGID BRONCHOSCOPY N/A 04/23/2013   Procedure: LASER BRONCHOSCOPY;  Surgeon: Loreli Slot, MD;  Location: [facility] OR;  Service: Thoracic;  Laterality: N/A;   SALPINGOOPHORECTOMY  1995   BILATERAL   VAGINAL HYSTERECTOMY  1988   VIDEO BRONCHOSCOPY Bilateral 03/26/2013   Procedure: VIDEO BRONCHOSCOPY WITHOUT FLUORO;  Surgeon: Nyoka Cowden, MD;  Location: [facility];  Service: Cardiopulmonary;  Laterality: Bilateral;   VIDEO BRONCHOSCOPY N/A 04/23/2013   Procedure: VIDEO BRONCHOSCOPY;  Surgeon: Loreli Slot, MD;  Location: [facility] OR;  Service: Thoracic;  Laterality: N/A;   VIDEO BRONCHOSCOPY N/A 05/28/2014   Procedure: VIDEO BRONCHOSCOPY;  Surgeon: Loreli Slot, MD;  Location: [facility] OR;  Service: Thoracic;  Laterality: N/A;    Social History  Socioeconomic History   Marital status: Married    Spouse name: Dean Every   Number of children: 1   Years of education: 12   Highest education level: 12th grade  Occupational History   Occupation: Retired    Comment: Psychologist, prison and probation services  Tobacco Use   Smoking status: Never   Smokeless tobacco: Never  Vaping Use   Vaping status: Never Used  Substance and Sexual Activity   Alcohol use: No   Drug use: No   Sexual activity: Not Currently    Partners: Male    Birth control/protection: Surgical  Other Topics Concern   Not on file  Social History Narrative   Lives with her husband. She has one child. She enjoys reading and art.   Social Drivers of Corporate investment banker Strain: Low Risk  (05/07/2023)   Overall Financial Resource Strain (CARDIA)     Difficulty of Paying Living Expenses: Not very hard  Food Insecurity: No Food Insecurity (05/07/2023)   Hunger Vital Sign    Worried About Running Out of Food in the Last Year: Never true    Ran Out of Food in the Last Year: Never true  Transportation Needs: No Transportation Needs (05/07/2023)   PRAPARE - Administrator, Civil Service (Medical): No    Lack of Transportation (Non-Medical): No  Physical Activity: Inactive (05/07/2023)   Exercise Vital Sign    Days of Exercise per Week: 0 days    Minutes of Exercise per Session: 0 min  Stress: No Stress Concern Present (05/07/2023)   [employer] of Occupational Health - Occupational Stress Questionnaire    Feeling of Stress : Not at all  Social Connections: Moderately Isolated (05/07/2023)   Social Connection and Isolation Panel [NHANES]    Frequency of Communication with Friends and Family: Twice a week    Frequency of Social Gatherings with Friends and Family: Once a week    Attends Religious Services: Never    Database administrator or Organizations: No    Attends Engineer, structural: Never    Marital Status: Married    Family History  Problem Relation Age of Onset   Cancer Mother        Colon Cancer survivor   Diabetes Mother    Colon cancer Mother    Diabetes Father    Hyperlipidemia Father    Alzheimer's disease Father    Cancer Maternal Grandmother        Breast Cancer   Breast cancer Maternal Grandmother    Cancer Daughter 70       stage III-B breast CA   Breast cancer Daughter    Colon polyps Brother    Allergies Daughter    Esophageal cancer Neg Hx    Rectal cancer Neg Hx    Stomach cancer Neg Hx     Health Maintenance  Topic Date Due   COVID-19 Vaccine (7 - 2024-25 season) 09/23/2022   Colonoscopy  03/06/2023   Medicare Annual Wellness (AWV)  07/11/2023   INFLUENZA VACCINE  08/23/2023   DTaP/Tdap/Td (3 - Td or Tdap) 01/27/2024   MAMMOGRAM  06/27/2024   Pneumonia Vaccine 65+ Years  old  Completed   DEXA SCAN  Completed   Hepatitis C Screening  Completed   Zoster Vaccines- Shingrix  Completed   HPV VACCINES  Aged Out   Meningococcal B Vaccine  Aged Out     ----------------------------------------------------------------------------------------------------------------------------------------------------------------------------------------------------------------- Physical Exam BP 115/70 (BP Location: Left Arm, Patient Position: Sitting, Cuff Size:  Normal)   Pulse 83   Ht 5' 2.6" (1.59 m)   Wt 167 lb (75.8 kg)   SpO2 96%   BMI 29.96 kg/m   Physical Exam Constitutional:      Appearance: Normal appearance.  Eyes:     General: No scleral icterus. Cardiovascular:     Rate and Rhythm: Normal rate and regular rhythm.  Pulmonary:     Effort: Pulmonary effort is normal.     Breath sounds: Normal breath sounds.  Musculoskeletal:     Cervical back: Neck supple.  Neurological:     Mental Status: She is alert.  Psychiatric:        Mood and Affect: Mood normal.        Behavior: Behavior normal.     ------------------------------------------------------------------------------------------------------------------------------------------------------------------------------------------------------------------- Assessment and Plan  Prediabetes Doing well with Ozempic.  She is receiving this through patient assistance program.. A1c i stable at 5.5% today.    Sleep pattern disturbance She tried Unisom previously however this stopped being effective.  Has been doing 2.5 mg of Ambien which is also no longer effective.  Will increase to 5 mg.  Greater trochanteric bursitis Pain bilaterally but more so on the right.  Given handout for home medication.  She may continue meloxicam.  Referral to sports medicine to discuss injection.   Meds ordered this encounter  Medications   zolpidem (AMBIEN) 5 MG tablet    Sig: Take 1 tablet (5 mg total) by mouth at bedtime as  needed for sleep.    Dispense:  90 tablet    Refill:  1    Return in about 6 months (around 11/07/2023).

## 2023-05-08 NOTE — Assessment & Plan Note (Signed)
 Pain bilaterally but more so on the right.  Given handout for home medication.  She may continue meloxicam.  Referral to sports medicine to discuss injection.

## 2023-05-08 NOTE — Assessment & Plan Note (Signed)
 Doing well with Ozempic.  She is receiving this through patient assistance program.. A1c i stable at 5.5% today.

## 2023-05-08 NOTE — Assessment & Plan Note (Signed)
 She tried Unisom previously however this stopped being effective.  Has been doing 2.5 mg of Ambien which is also no longer effective.  Will increase to 5 mg.

## 2023-05-20 ENCOUNTER — Ambulatory Visit (INDEPENDENT_AMBULATORY_CARE_PROVIDER_SITE_OTHER): Admitting: Sports Medicine

## 2023-05-20 ENCOUNTER — Ambulatory Visit

## 2023-05-20 DIAGNOSIS — M7060 Trochanteric bursitis, unspecified hip: Secondary | ICD-10-CM | POA: Diagnosis not present

## 2023-05-20 DIAGNOSIS — M25551 Pain in right hip: Secondary | ICD-10-CM | POA: Diagnosis not present

## 2023-05-20 DIAGNOSIS — M25552 Pain in left hip: Secondary | ICD-10-CM

## 2023-05-20 DIAGNOSIS — I878 Other specified disorders of veins: Secondary | ICD-10-CM | POA: Diagnosis not present

## 2023-05-20 MED ORDER — DICLOFENAC SODIUM 75 MG PO TBEC: 75.0000 mg | DELAYED_RELEASE_TABLET | Freq: Two times a day (BID) | ORAL | 3 refills | Status: DC

## 2023-05-20 NOTE — Progress Notes (Signed)
    Procedures performed today:    None.  Independent interpretation of notes and tests performed by another provider:   None.  Brief History, Exam, Impression, and Recommendations:    Greater trochanteric bursitis Very pleasant 73 year old female, many months of pain bilateral hips localized laterally. She has been on meloxicam  for several years now. On exam she has tenderness at the greater trochanter, she has weak hip abductors bilaterally, good hip internal and external rotation. I explained the importance of improving the biomechanics including strengthening of the hip abductors to create resolution of trochanteric bursitis. I explained that trochanteric bursitis was not typically a true bursitis but more of a tendinopathy of the hip abductors. She will do aggressive physical therapy for 6 weeks, we will switch her from meloxicam  to Voltaren , I informed her that when on a medication for several years we develop tachyphylaxis and sometimes we need to rotate NSAIDs. I would like bilateral hip x-rays, return to see me in 6 weeks, injections if not better.    ____________________________________________ Joselyn Nicely. Sandy Crumb, M.D., ABFM., CAQSM., AME. Primary Care and Sports Medicine [facility]  Adjunct Professor of [facility] Medicine  [employer]  Restaurant manager, fast food

## 2023-05-20 NOTE — Assessment & Plan Note (Signed)
 Very pleasant 73 year old female, many months of pain bilateral hips localized laterally. She has been on meloxicam  for several years now. On exam she has tenderness at the greater trochanter, she has weak hip abductors bilaterally, good hip internal and external rotation. I explained the importance of improving the biomechanics including strengthening of the hip abductors to create resolution of trochanteric bursitis. I explained that trochanteric bursitis was not typically a true bursitis but more of a tendinopathy of the hip abductors. She will do aggressive physical therapy for 6 weeks, we will switch her from meloxicam  to Voltaren , I informed her that when on a medication for several years we develop tachyphylaxis and sometimes we need to rotate NSAIDs. I would like bilateral hip x-rays, return to see me in 6 weeks, injections if not better.

## 2023-05-20 NOTE — Progress Notes (Signed)
 Christina Hayes

## 2023-05-21 NOTE — Therapy (Signed)
 OUTPATIENT PHYSICAL THERAPY LOWER EXTREMITY EVALUATION   Patient Name: Christina Hayes MRN: [id number] DOB:1950/07/28, 73 y.o., female Today's Date: 05/22/2023  END OF SESSION:  PT End of Session - 05/22/23 1406     Visit Number 1    Date for PT Re-Evaluation 07/03/23    Authorization Type [employer] MCR    Progress Note Due on Visit 10    PT Start Time 1407    PT Stop Time 1446    PT Time Calculation (min) 39 min    Activity Tolerance Patient tolerated treatment well    Behavior During Therapy WFL for tasks assessed/performed             Past Medical History:  Diagnosis Date   Acid reflux    Allergic rhinitis, cause unspecified    Allergy    Anemia    years ago    Cataract    Complication of anesthesia SLOW TO WAKE   Cough    DJD (degenerative joint disease)    spine   Frequency of urination    H/O hiatal hernia    small   Head cold    Headache(784.0)    migraqine- uses essential oils   HYPERLIPIDEMIA, MIXED 12/03/2006   PT STATES NOT TAKING MED AS PRESCRIBED   MIGRAINES, HX OF 12/03/2006   Neuromuscular disorder (HCC)    Nocturia    OSA on CPAP    Osteopenia    PONV (postoperative nausea and vomiting)    in past   Shortness of breath    Sleep apnea    cpap   SUI (stress urinary incontinence, female)    Urgency of urination    VERTIGO, HX OF 12/03/2006   Past Surgical History:  Procedure Laterality Date   COLONOSCOPY     PUBOVAGINAL SLING  04/30/2011   Procedure: Gino Lais;  Surgeon: Livingston Rigg, MD;  Location: [facility];  Service: Urology;  Laterality: N/A;  sub urethral sling    possible ower   RIGID BRONCHOSCOPY N/A 04/23/2013   Procedure: LASER BRONCHOSCOPY;  Surgeon: Zelphia Higashi, MD;  Location: [facility] OR;  Service: Thoracic;  Laterality: N/A;   SALPINGOOPHORECTOMY  1995   BILATERAL   VAGINAL HYSTERECTOMY  1988   VIDEO BRONCHOSCOPY Bilateral 03/26/2013   Procedure: VIDEO BRONCHOSCOPY WITHOUT FLUORO;  Surgeon:  Diamond Formica, MD;  Location: [facility];  Service: Cardiopulmonary;  Laterality: Bilateral;   VIDEO BRONCHOSCOPY N/A 04/23/2013   Procedure: VIDEO BRONCHOSCOPY;  Surgeon: Zelphia Higashi, MD;  Location: [facility] OR;  Service: Thoracic;  Laterality: N/A;   VIDEO BRONCHOSCOPY N/A 05/28/2014   Procedure: VIDEO BRONCHOSCOPY;  Surgeon: Zelphia Higashi, MD;  Location: [facility] OR;  Service: Thoracic;  Laterality: N/A;   Patient Active Problem List   Diagnosis Date Noted   Greater trochanteric bursitis 05/08/2023   Prediabetes 10/23/2021   Osteoporosis 04/18/2020   Chronic rhinitis 12/28/2019   Osteoarthritis 10/18/2019   Weight gain 04/13/2019   GERD (gastroesophageal reflux disease) 04/13/2019   Insomnia 04/13/2019   DJD (degenerative joint disease) of knee 09/06/2015   Degenerative tear of medial meniscus 09/06/2015   Severe sleep apnea 07/21/2015   Osteopenia determined by x-ray 07/20/2015   Lumbago 06/15/2015   Left knee pain 06/10/2015   Carcinoid tumor 05/18/2014   Sleep pattern disturbance 01/26/2014   Obesity, unspecified 03/25/2012   HLD (hyperlipidemia) 12/03/2006   VERTIGO, HX OF 12/03/2006   Migraine 12/03/2006    PCP: Adela Holter, DO   REFERRING  PROVIDER: Gean Keels,*   REFERRING DIAG: M70.60 (ICD-10-CM) - Greater trochanteric bursitis, unspecified laterality   THERAPY DIAG:  Pain of both hip joints  Cramp and spasm  Rationale for Evaluation and Treatment: Rehabilitation  ONSET DATE: months ago  SUBJECTIVE:   SUBJECTIVE STATEMENT:  I can't walk as far as I want because the hips start hurting. I would like to be able to walk for an hour for exercise. I don't want to be limited.  PERTINENT HISTORY: vertigo (please try to avoid any floor exercises) PAIN:  Are you having pain? Yes: NPRS scale: 0 today up to 7/10 Pain location: B hips Pain description: sharp Aggravating factors: walking, sometimes lying down, a little bit with  stairs Relieving factors: resting, meds   PRECAUTIONS: None  RED FLAGS: None   WEIGHT BEARING RESTRICTIONS: No  FALLS:  Has patient fallen in last 6 months? Yes. Number of falls 1 6 months ago, she was moving/carrying something and she caught her toe and fell to her knees.  LIVING ENVIRONMENT: Lives with: lives with their spouse Lives in: House/apartment Stairs: Yes: External: 1 steps; none Has following equipment at home: None  OCCUPATION: retired  PLOF: Independent  PATIENT GOALS: not have pain  NEXT MD VISIT: 6 weeks  OBJECTIVE:  Note: Objective measures were completed at Evaluation unless otherwise noted.  DIAGNOSTIC FINDINGS: none  PATIENT SURVEYS:  The Patient-Specific Functional Scale  Initial:  I am going to ask you to identify up to 3 important activities that you are unable to do or are having difficulty with as a result of this problem.  Today are there any activities that you are unable to do or having difficulty with because of this?  (Patient shown scale and patient rated each activity)  Follow up: When you first came in you had difficulty performing these activities.  Today do you still have difficulty?  Patient-Specific activity scoring scheme (Point to one number):  0 1 2 3 4 5 6 7 8 9  10 Unable                                                                                                          Able to perform To perform                                                                                                    activity at the same Activity         Level as before  Injury or problem  Activity        Walking                                                                  Initial:         8              follow up:  2.                                                                            Initial:                        follow up:  3.                                                                                  Initial:                       follow up:     COGNITION: Overall cognitive status: Within functional limits for tasks assessed     SENSATION: WFL  MUSCLE LENGTH: mild tightness B HS and piriformis   POSTURE: No Significant postural limitations  PALPATION: Bil glute min, med and greater trochanters  LOWER EXTREMITY ROM: B LE WFL for tasks assessed   LOWER EXTREMITY MMT: hips 4+ to 5 B   FUNCTIONAL TESTS:  5 times sit to stand: 14.77 sec                                                                                                                                  TREATMENT DATE:   05/22/23  See pt ed and HEP   PATIENT EDUCATION:  Education details: PT eval findings, anticipated POC, initial HEP, and DN rational, procedure, outcomes, potential side effects, and recommended post-treatment exercises/activity  Person educated: Patient Education method: Explanation, Demonstration, and Handouts Education comprehension: verbalized understanding and returned demonstration  HOME EXERCISE PROGRAM: Access Code: [email or web address] URL: [email or web address] Date: 05/22/2023 Prepared by: Concha Deed  Exercises - Seated Piriformis Stretch with Trunk Bend  - 2  x daily - 7 x weekly - 1 sets - 3 reps - 30-60 sec  hold - Standing Isometric Hip Abduction with Mini Squat and Ball on Wall  - 1 x daily - 3 x weekly - 2 sets - 10 reps - Standing Hip Abduction with Resistance at Ankles and Counter Support  - 1 x daily - 3 x weekly - 2 sets - 10 reps - Standing Hip Extension with Resistance at Ankles and Counter Support  - 1 x daily - 3 x weekly - 2 sets - 10 reps  ASSESSMENT:  CLINICAL IMPRESSION: Patient is a 73 y.o. female who was seen today for physical therapy evaluation and treatment for B hip trochanteric bursitis beginning months ago and worsening to where it limits her ability to  walk for exercises. She has mild flexibility and strength deficits and TTP in B gluteals and at greater trochanters. She will benefit from skilled PT to address these deficits.     OBJECTIVE IMPAIRMENTS: decreased activity tolerance, decreased strength, increased muscle spasms, impaired flexibility, and pain.   ACTIVITY LIMITATIONS: locomotion level  PARTICIPATION LIMITATIONS:  not limited but painful  PERSONAL FACTORS: Time since onset of injury/illness/exacerbation and 1 comorbidity: vertigo  are also affecting patient's functional outcome.   REHAB POTENTIAL: Excellent  CLINICAL DECISION MAKING: Stable/uncomplicated  EVALUATION COMPLEXITY: Low   GOALS: Goals reviewed with patient? Yes  SHORT TERM GOALS: Target date: 06/12/2023   Patient will be independent with initial HEP. Baseline:  Goal status: INITIAL  2.  Decreased hip pain with walking by 50% Baseline:  Goal status: INITIAL    LONG TERM GOALS: Target date: 07/03/2023   Patient will be independent with advanced/ongoing HEP to improve outcomes and carryover.  Baseline:  Goal status: INITIAL  2.  Patient able to walk her normal walking program without pain.  Baseline:  Goal status: INITIAL  3.  Patient will demonstrate improved LE strength to 5/5. Baseline:  Goal status: INITIAL      PLAN:  PT FREQUENCY: 2x/week  PT DURATION: 6 weeks  PLANNED INTERVENTIONS: 97164- PT Re-evaluation, 97110-Therapeutic exercises, 97530- Therapeutic activity, [id number]- Neuromuscular re-education, 97535- Self Care, 86578- Manual therapy, [phone]- Gait training, [phone]- Electrical stimulation (unattended), [phone]- Electrical stimulation (manual), [phone]- Ionotophoresis 4mg /ml Dexamethasone , Patient/Family education, Balance training, Taping, Dry Needling, Joint mobilization, Spinal mobilization, Cryotherapy, and Moist heat  PLAN FOR NEXT SESSION: DN to B gluteals, LE strength and flexibility, (Needs aggressive hip abductor  strengthening  per Dr. Elva Hamburger)   Jinx Mourning, PT  05/22/2023, 5:37 PM

## 2023-05-22 ENCOUNTER — Other Ambulatory Visit: Payer: Self-pay

## 2023-05-22 ENCOUNTER — Encounter: Payer: Self-pay | Admitting: Physical Therapy

## 2023-05-22 ENCOUNTER — Ambulatory Visit: Attending: Sports Medicine | Admitting: Physical Therapy

## 2023-05-22 DIAGNOSIS — R252 Cramp and spasm: Secondary | ICD-10-CM | POA: Insufficient documentation

## 2023-05-22 DIAGNOSIS — M25551 Pain in right hip: Secondary | ICD-10-CM | POA: Insufficient documentation

## 2023-05-22 DIAGNOSIS — M25552 Pain in left hip: Secondary | ICD-10-CM | POA: Diagnosis present

## 2023-05-22 DIAGNOSIS — M7060 Trochanteric bursitis, unspecified hip: Secondary | ICD-10-CM | POA: Insufficient documentation

## 2023-05-24 ENCOUNTER — Ambulatory Visit: Attending: Sports Medicine

## 2023-05-24 ENCOUNTER — Ambulatory Visit (INDEPENDENT_AMBULATORY_CARE_PROVIDER_SITE_OTHER)

## 2023-05-24 DIAGNOSIS — R252 Cramp and spasm: Secondary | ICD-10-CM | POA: Diagnosis present

## 2023-05-24 DIAGNOSIS — M6281 Muscle weakness (generalized): Secondary | ICD-10-CM | POA: Diagnosis present

## 2023-05-24 DIAGNOSIS — J309 Allergic rhinitis, unspecified: Secondary | ICD-10-CM | POA: Diagnosis not present

## 2023-05-24 DIAGNOSIS — R262 Difficulty in walking, not elsewhere classified: Secondary | ICD-10-CM | POA: Insufficient documentation

## 2023-05-24 DIAGNOSIS — R293 Abnormal posture: Secondary | ICD-10-CM | POA: Diagnosis present

## 2023-05-24 DIAGNOSIS — M25551 Pain in right hip: Secondary | ICD-10-CM | POA: Diagnosis present

## 2023-05-24 DIAGNOSIS — M25552 Pain in left hip: Secondary | ICD-10-CM | POA: Insufficient documentation

## 2023-05-24 NOTE — Therapy (Signed)
 OUTPATIENT PHYSICAL THERAPY LOWER EXTREMITY TREATMENT   Patient Name: Christina Hayes MRN: [id number] DOB:Jun 19, 1950, 73 y.o., female Today's Date: 05/24/2023  END OF SESSION:  PT End of Session - 05/24/23 1202     Visit Number 2    Date for PT Re-Evaluation 07/03/23    Authorization Type UHC MCR    Progress Note Due on Visit 10    PT Start Time 1105    PT Stop Time 1145    PT Time Calculation (min) 40 min    Activity Tolerance Patient tolerated treatment well    Behavior During Therapy WFL for tasks assessed/performed              Past Medical History:  Diagnosis Date   Acid reflux    Allergic rhinitis, cause unspecified    Allergy    Anemia    years ago    Cataract    Complication of anesthesia SLOW TO WAKE   Cough    DJD (degenerative joint disease)    spine   Frequency of urination    H/O hiatal hernia    small   Head cold    Headache(784.0)    migraqine- uses essential oils   HYPERLIPIDEMIA, MIXED 12/03/2006   PT STATES NOT TAKING MED AS PRESCRIBED   MIGRAINES, HX OF 12/03/2006   Neuromuscular disorder (HCC)    Nocturia    OSA on CPAP    Osteopenia    PONV (postoperative nausea and vomiting)    in past   Shortness of breath    Sleep apnea    cpap   SUI (stress urinary incontinence, female)    Urgency of urination    VERTIGO, HX OF 12/03/2006   Past Surgical History:  Procedure Laterality Date   COLONOSCOPY     PUBOVAGINAL SLING  04/30/2011   Procedure: Gino Lais;  Surgeon: Livingston Rigg, MD;  Location: [facility];  Service: Urology;  Laterality: N/A;  sub urethral sling    possible ower   RIGID BRONCHOSCOPY N/A 04/23/2013   Procedure: LASER BRONCHOSCOPY;  Surgeon: Zelphia Higashi, MD;  Location: [facility] OR;  Service: Thoracic;  Laterality: N/A;   SALPINGOOPHORECTOMY  1995   BILATERAL   VAGINAL HYSTERECTOMY  1988   VIDEO BRONCHOSCOPY Bilateral 03/26/2013   Procedure: VIDEO BRONCHOSCOPY WITHOUT FLUORO;  Surgeon:  Diamond Formica, MD;  Location: [facility];  Service: Cardiopulmonary;  Laterality: Bilateral;   VIDEO BRONCHOSCOPY N/A 04/23/2013   Procedure: VIDEO BRONCHOSCOPY;  Surgeon: Zelphia Higashi, MD;  Location: [facility] OR;  Service: Thoracic;  Laterality: N/A;   VIDEO BRONCHOSCOPY N/A 05/28/2014   Procedure: VIDEO BRONCHOSCOPY;  Surgeon: Zelphia Higashi, MD;  Location: [facility] OR;  Service: Thoracic;  Laterality: N/A;   Patient Active Problem List   Diagnosis Date Noted   Greater trochanteric bursitis 05/08/2023   Prediabetes 10/23/2021   Osteoporosis 04/18/2020   Chronic rhinitis 12/28/2019   Osteoarthritis 10/18/2019   Weight gain 04/13/2019   GERD (gastroesophageal reflux disease) 04/13/2019   Insomnia 04/13/2019   DJD (degenerative joint disease) of knee 09/06/2015   Degenerative tear of medial meniscus 09/06/2015   Severe sleep apnea 07/21/2015   Osteopenia determined by x-ray 07/20/2015   Lumbago 06/15/2015   Left knee pain 06/10/2015   Carcinoid tumor 05/18/2014   Sleep pattern disturbance 01/26/2014   Obesity, unspecified 03/25/2012   HLD (hyperlipidemia) 12/03/2006   VERTIGO, HX OF 12/03/2006   Migraine 12/03/2006    PCP: Adela Holter, DO  REFERRING PROVIDER: Gean Keels,*   REFERRING DIAG: M70.60 (ICD-10-CM) - Greater trochanteric bursitis, unspecified laterality   THERAPY DIAG:  Pain of both hip joints  Cramp and spasm  Difficulty in walking, not elsewhere classified  Muscle weakness (generalized)  Abnormal posture  Rationale for Evaluation and Treatment: Rehabilitation  ONSET DATE: months ago  SUBJECTIVE:   SUBJECTIVE STATEMENT: Patient reports some minor improvement in overall pain.  PERTINENT HISTORY: vertigo (please try to avoid any floor exercises) PAIN:  05/24/23 Are you having pain? Yes: NPRS scale: 0 today up to 4/10 Pain location: B hips Pain description: sharp Aggravating factors: walking, sometimes lying down, a little bit  with stairs Relieving factors: resting, meds   PRECAUTIONS: None  RED FLAGS: None   WEIGHT BEARING RESTRICTIONS: No  FALLS:  Has patient fallen in last 6 months? Yes. Number of falls 1 6 months ago, she was moving/carrying something and she caught her toe and fell to her knees.  LIVING ENVIRONMENT: Lives with: lives with their spouse Lives in: House/apartment Stairs: Yes: External: 1 steps; none Has following equipment at home: None  OCCUPATION: retired  PLOF: Independent  PATIENT GOALS: not have pain  NEXT MD VISIT: 6 weeks  OBJECTIVE:  Note: Objective measures were completed at Evaluation unless otherwise noted.  DIAGNOSTIC FINDINGS: none  PATIENT SURVEYS:  The Patient-Specific Functional Scale  Initial:  I am going to ask you to identify up to 3 important activities that you are unable to do or are having difficulty with as a result of this problem.  Today are there any activities that you are unable to do or having difficulty with because of this?  (Patient shown scale and patient rated each activity)  Follow up: When you first came in you had difficulty performing these activities.  Today do you still have difficulty?  Patient-Specific activity scoring scheme (Point to one number):  0 1 2 3 4 5 6 7 8 9  10 Unable                                                                                                          Able to perform To perform                                                                                                    activity at the same Activity         Level as before  Injury or problem  Activity        Walking                                                                  Initial:         8              follow up:  2.                                                                            Initial:                        follow up:  3.                                                                                  Initial:                       follow up:     COGNITION: Overall cognitive status: Within functional limits for tasks assessed     SENSATION: WFL  MUSCLE LENGTH: mild tightness B HS and piriformis   POSTURE: No Significant postural limitations  PALPATION: Bil glute min, med and greater trochanters  LOWER EXTREMITY ROM: B LE WFL for tasks assessed   LOWER EXTREMITY MMT: hips 4+ to 5 B   FUNCTIONAL TESTS:  5 times sit to stand: 14.77 sec                                                                                                                                  TREATMENT DATE:   05/24/23 Nustep x 7 min (PT present to discuss status) Reviewed all of HEP (patient needed multiple vc's for correct technique) Trigger Point Dry Needling Initial Treatment: Pt instructed on Dry Needling rational, procedures, and possible side effects. Pt instructed to expect mild to moderate muscle soreness later in the day and/or into the next day.  Pt instructed in methods to reduce muscle soreness. Pt instructed to continue prescribed HEP. Patient was educated on signs  and symptoms of infection and other risk factors and advised to seek medical attention should they occur.  Patient verbalized understanding of these instructions and education.   Patient Verbal Consent Given: Yes Education Handout Provided: Previously Provided Muscles Treated: Glut max, med and min bilaterally Electrical Stimulation Performed: No Treatment Response/Outcome: Skilled palpation used to identify taut bands and trigger points.  Once identified, dry needling techniques used to treat these areas.  Twitch response ellicited along with palpable elongation of muscle.  Following treatment, patient reported minor soreness.     05/22/23  See pt ed and HEP   PATIENT EDUCATION:  Education details: PT eval findings,  anticipated POC, initial HEP, and DN rational, procedure, outcomes, potential side effects, and recommended post-treatment exercises/activity  Person educated: Patient Education method: Explanation, Demonstration, and Handouts Education comprehension: verbalized understanding and returned demonstration  HOME EXERCISE PROGRAM: Access Code: [email or web address] URL: [email or web address] Date: 05/22/2023 Prepared by: Concha Deed  Exercises - Seated Piriformis Stretch with Trunk Bend  - 2 x daily - 7 x weekly - 1 sets - 3 reps - 30-60 sec  hold - Standing Isometric Hip Abduction with Mini Squat and Ball on Wall  - 1 x daily - 3 x weekly - 2 sets - 10 reps - Standing Hip Abduction with Resistance at Ankles and Counter Support  - 1 x daily - 3 x weekly - 2 sets - 10 reps - Standing Hip Extension with Resistance at Ankles and Counter Support  - 1 x daily - 3 x weekly - 2 sets - 10 reps  ASSESSMENT:  CLINICAL IMPRESSION: Anselmo Kings seems to have responded well to initial HEP.  Her pain reduced from 7/10 to 4/10.  She was busy taking care of her Mom so she has not had the chance to walk or do much of her HEP.  We reviewed her current HEP.  She needed several verbal cues for correct technique.  We also initiated dry needling today.  She had minimal twitch response and minimal deep ache.  May try DN again next week if she is still having pain.   She will benefit from continuing skilled PT to address these deficits.     OBJECTIVE IMPAIRMENTS: decreased activity tolerance, decreased strength, increased muscle spasms, impaired flexibility, and pain.   ACTIVITY LIMITATIONS: locomotion level  PARTICIPATION LIMITATIONS:  not limited but painful  PERSONAL FACTORS: Time since onset of injury/illness/exacerbation and 1 comorbidity: vertigo  are also affecting patient's functional outcome.   REHAB POTENTIAL: Excellent  CLINICAL DECISION MAKING: Stable/uncomplicated  EVALUATION COMPLEXITY:  Low   GOALS: Goals reviewed with patient? Yes  SHORT TERM GOALS: Target date: 06/12/2023   Patient will be independent with initial HEP. Baseline:  Goal status: INITIAL  2.  Decreased hip pain with walking by 50% Baseline:  Goal status: INITIAL    LONG TERM GOALS: Target date: 07/03/2023   Patient will be independent with advanced/ongoing HEP to improve outcomes and carryover.  Baseline:  Goal status: INITIAL  2.  Patient able to walk her normal walking program without pain.  Baseline:  Goal status: INITIAL  3.  Patient will demonstrate improved LE strength to 5/5. Baseline:  Goal status: INITIAL      PLAN:  PT FREQUENCY: 2x/week  PT DURATION: 6 weeks  PLANNED INTERVENTIONS: 97164- PT Re-evaluation, 97110-Therapeutic exercises, 97530- Therapeutic activity, [id number]- Neuromuscular re-education, 97535- Self Care, 54098- Manual therapy, [id number]- Gait training, [phone]- Electrical stimulation (unattended), [id number]- Electrical stimulation (manual), [id number]- Ionotophoresis 4mg /ml Dexamethasone , Patient/Family education, Balance training, Taping,  Dry Needling, Joint mobilization, Spinal mobilization, Cryotherapy, and Moist heat  PLAN FOR NEXT SESSION: Assess response to DN #1 to B gluteals, LE strength and flexibility, (Needs aggressive hip abductor strengthening  per Dr. Elva Hamburger)   [facility] B. Atiba Kimberlin, PT 05/24/23 12:12 PM [facility] [address] [place], [place] [address] Phone # [phone] Fax [phone]

## 2023-05-28 ENCOUNTER — Ambulatory Visit: Admitting: Physical Therapy

## 2023-05-28 ENCOUNTER — Encounter: Payer: Self-pay | Admitting: Physical Therapy

## 2023-05-28 DIAGNOSIS — R252 Cramp and spasm: Secondary | ICD-10-CM

## 2023-05-28 DIAGNOSIS — M6281 Muscle weakness (generalized): Secondary | ICD-10-CM | POA: Diagnosis not present

## 2023-05-28 DIAGNOSIS — R262 Difficulty in walking, not elsewhere classified: Secondary | ICD-10-CM | POA: Diagnosis not present

## 2023-05-28 DIAGNOSIS — M25551 Pain in right hip: Secondary | ICD-10-CM | POA: Diagnosis not present

## 2023-05-28 DIAGNOSIS — R293 Abnormal posture: Secondary | ICD-10-CM | POA: Diagnosis not present

## 2023-05-28 DIAGNOSIS — M25552 Pain in left hip: Secondary | ICD-10-CM | POA: Diagnosis not present

## 2023-05-28 NOTE — Therapy (Signed)
 OUTPATIENT PHYSICAL THERAPY LOWER EXTREMITY TREATMENT   Patient Name: Christina Hayes MRN: [id number] DOB:28-Jun-1950, 73 y.o., female Today's Date: 05/28/2023  END OF SESSION:  PT End of Session - 05/28/23 1140     Visit Number 3    Date for PT Re-Evaluation 07/03/23    Authorization Type [employer] APPROVED [id number] VISITS 05/22/2023 - 07/03/2023    Authorization Time Period 05/22/2023 - 07/03/2023    Authorization - Visit Number 3    Authorization - Number of Visits 8    Progress Note Due on Visit 10    PT Start Time 1058    PT Stop Time 1139    PT Time Calculation (min) 41 min    Activity Tolerance Patient tolerated treatment well    Behavior During Therapy WFL for tasks assessed/performed               Past Medical History:  Diagnosis Date   Acid reflux    Allergic rhinitis, cause unspecified    Allergy    Anemia    years ago    Cataract    Complication of anesthesia SLOW TO WAKE   Cough    DJD (degenerative joint disease)    spine   Frequency of urination    H/O hiatal hernia    small   Head cold    Headache(784.0)    migraqine- uses essential oils   HYPERLIPIDEMIA, MIXED 12/03/2006   PT STATES NOT TAKING MED AS PRESCRIBED   MIGRAINES, HX OF 12/03/2006   Neuromuscular disorder (HCC)    Nocturia    OSA on CPAP    Osteopenia    PONV (postoperative nausea and vomiting)    in past   Shortness of breath    Sleep apnea    cpap   SUI (stress urinary incontinence, female)    Urgency of urination    VERTIGO, HX OF 12/03/2006   Past Surgical History:  Procedure Laterality Date   COLONOSCOPY     PUBOVAGINAL SLING  04/30/2011   Procedure: Gino Lais;  Surgeon: Livingston Rigg, MD;  Location: [facility];  Service: Urology;  Laterality: N/A;  sub urethral sling    possible ower   RIGID BRONCHOSCOPY N/A 04/23/2013   Procedure: LASER BRONCHOSCOPY;  Surgeon: Zelphia Higashi, MD;  Location: [facility] OR;  Service: Thoracic;  Laterality:  N/A;   SALPINGOOPHORECTOMY  1995   BILATERAL   VAGINAL HYSTERECTOMY  1988   VIDEO BRONCHOSCOPY Bilateral 03/26/2013   Procedure: VIDEO BRONCHOSCOPY WITHOUT FLUORO;  Surgeon: Diamond Formica, MD;  Location: [facility];  Service: Cardiopulmonary;  Laterality: Bilateral;   VIDEO BRONCHOSCOPY N/A 04/23/2013   Procedure: VIDEO BRONCHOSCOPY;  Surgeon: Zelphia Higashi, MD;  Location: [facility] OR;  Service: Thoracic;  Laterality: N/A;   VIDEO BRONCHOSCOPY N/A 05/28/2014   Procedure: VIDEO BRONCHOSCOPY;  Surgeon: Zelphia Higashi, MD;  Location: [facility] OR;  Service: Thoracic;  Laterality: N/A;   Patient Active Problem List   Diagnosis Date Noted   Greater trochanteric bursitis 05/08/2023   Prediabetes 10/23/2021   Osteoporosis 04/18/2020   Chronic rhinitis 12/28/2019   Osteoarthritis 10/18/2019   Weight gain 04/13/2019   GERD (gastroesophageal reflux disease) 04/13/2019   Insomnia 04/13/2019   DJD (degenerative joint disease) of knee 09/06/2015   Degenerative tear of medial meniscus 09/06/2015   Severe sleep apnea 07/21/2015   Osteopenia determined by x-ray 07/20/2015   Lumbago 06/15/2015   Left knee pain 06/10/2015   Carcinoid tumor 05/18/2014  Sleep pattern disturbance 01/26/2014   Obesity, unspecified 03/25/2012   HLD (hyperlipidemia) 12/03/2006   VERTIGO, HX OF 12/03/2006   Migraine 12/03/2006    PCP: Adela Holter, DO   REFERRING PROVIDER: Gean Keels,*   REFERRING DIAG: M70.60 (ICD-10-CM) - Greater trochanteric bursitis, unspecified laterality   THERAPY DIAG:  Pain of both hip joints  Cramp and spasm  Rationale for Evaluation and Treatment: Rehabilitation  ONSET DATE: months ago  SUBJECTIVE:   SUBJECTIVE STATEMENT: Patient report she felt good with dry needling last session. She is not currently having any hip pain but she is having low back pain. PERTINENT HISTORY: vertigo (please try to avoid any floor exercises) PAIN:  05/28/23 Are you having  pain? Yes: NPRS scale: 0 today up to 4/10 Pain location: B hips Pain description: sharp Aggravating factors: walking, sometimes lying down, a little bit with stairs Relieving factors: resting, meds   PRECAUTIONS: None  RED FLAGS: None   WEIGHT BEARING RESTRICTIONS: No  FALLS:  Has patient fallen in last 6 months? Yes. Number of falls 1 6 months ago, she was moving/carrying something and she caught her toe and fell to her knees.  LIVING ENVIRONMENT: Lives with: lives with their spouse Lives in: House/apartment Stairs: Yes: External: 1 steps; none Has following equipment at home: None  OCCUPATION: retired  PLOF: Independent  PATIENT GOALS: not have pain  NEXT MD VISIT: 6 weeks  OBJECTIVE:  Note: Objective measures were completed at Evaluation unless otherwise noted.  DIAGNOSTIC FINDINGS: none  PATIENT SURVEYS:  The Patient-Specific Functional Scale  Initial:  I am going to ask you to identify up to 3 important activities that you are unable to do or are having difficulty with as a result of this problem.  Today are there any activities that you are unable to do or having difficulty with because of this?  (Patient shown scale and patient rated each activity)  Follow up: When you first came in you had difficulty performing these activities.  Today do you still have difficulty?  Patient-Specific activity scoring scheme (Point to one number):  0 1 2 3 4 5 6 7 8 9  10 Unable                                                                                                          Able to perform To perform                                                                                                    activity at the same Activity         Level as before  Injury or problem  Activity        Walking                                                                   Initial:         8              follow up:  2.                                                                            Initial:                       follow up:  3.                                                                                  Initial:                       follow up:     COGNITION: Overall cognitive status: Within functional limits for tasks assessed     SENSATION: WFL  MUSCLE LENGTH: mild tightness B HS and piriformis   POSTURE: No Significant postural limitations  PALPATION: Bil glute min, med and greater trochanters  LOWER EXTREMITY ROM: B LE WFL for tasks assessed   LOWER EXTREMITY MMT: hips 4+ to 5 B   FUNCTIONAL TESTS:  5 times sit to stand: 14.77 sec                                                                                                                                  TREATMENT DATE:   05/28/23 Nustep x 6 min Level 4(PT present to discuss status) Seated piriformis stretch 2 x 30 sec bilateral  Side stepping with red loop at barre x 4 laps Sit to Stand with red loop around legs x 10 no weight x 10 5# KB hold 6 inch step ups with 5# unilateral KB hold x 10 bilateral  Hooklying hip abduction with red loop x 20 Hooklying hip adduction ball squeeze x 20 SLR  x 10 bilateral  Bridging 2 x 10 (v/c for TA activation) Sidelying hip adduction 2 x 10 bilateral     05/24/23 Nustep x 7 min (PT present to discuss status) Reviewed all of HEP (patient needed multiple vc's for correct technique) Trigger Point Dry Needling Initial Treatment: Pt instructed on Dry Needling rational, procedures, and possible side effects. Pt instructed to expect mild to moderate muscle soreness later in the day and/or into the next day.  Pt instructed in methods to reduce muscle soreness. Pt instructed to continue prescribed HEP. Patient was educated on signs and symptoms of infection and other risk factors and advised to seek medical attention should they  occur.  Patient verbalized understanding of these instructions and education.   Patient Verbal Consent Given: Yes Education Handout Provided: Previously Provided Muscles Treated: Glut max, med and min bilaterally Electrical Stimulation Performed: No Treatment Response/Outcome: Skilled palpation used to identify taut bands and trigger points.  Once identified, dry needling techniques used to treat these areas.  Twitch response ellicited along with palpable elongation of muscle.  Following treatment, patient reported minor soreness.     05/22/23  See pt ed and HEP   PATIENT EDUCATION:  Education details: PT eval findings, anticipated POC, initial HEP, and DN rational, procedure, outcomes, potential side effects, and recommended post-treatment exercises/activity  Person educated: Patient Education method: Explanation, Demonstration, and Handouts Education comprehension: verbalized understanding and returned demonstration  HOME EXERCISE PROGRAM: Access Code: [email or web address] URL: [email or web address] Date: 05/28/2023 Prepared by: Penelope Bowie  Exercises - Seated Piriformis Stretch with Trunk Bend  - 2 x daily - 7 x weekly - 1 sets - 3 reps - 30-60 sec  hold - Standing Isometric Hip Abduction with Mini Squat and Ball on Wall  - 1 x daily - 3 x weekly - 2 sets - 10 reps - Standing Hip Abduction with Resistance at Ankles and Counter Support  - 1 x daily - 3 x weekly - 2 sets - 10 reps - Standing Hip Extension with Resistance at Ankles and Counter Support  - 1 x daily - 3 x weekly - 2 sets - 10 reps - Side Stepping with Resistance at Thighs  - 1 x daily - 7 x weekly - 4 sets - 10 reps - Sit to Stand Without Arm Support  - 1 x daily - 7 x weekly - 2 sets - 10 reps  ASSESSMENT:  CLINICAL IMPRESSION: Anselmo Kings presents to therapy with no increased hip pain. She verbalized that dry needling was very helpful at alleviating her pain. Incorporated more hip stability exercises and patient  required verbal and visual cues for correct exercise performance. Patient tolerated treatment session well and verbalized some fatigue with step ups and sit to stand exercises. Updated HEP to include exercise progressions. Patient should continue to progress well with physical therapy. Patient will benefit from skilled PT to address the below impairments and improve overall function.   OBJECTIVE IMPAIRMENTS: decreased activity tolerance, decreased strength, increased muscle spasms, impaired flexibility, and pain.   ACTIVITY LIMITATIONS: locomotion level  PARTICIPATION LIMITATIONS:  not limited but painful  PERSONAL FACTORS: Time since onset of injury/illness/exacerbation and 1 comorbidity: vertigo  are also affecting patient's functional outcome.   REHAB POTENTIAL: Excellent  CLINICAL DECISION MAKING: Stable/uncomplicated  EVALUATION COMPLEXITY: Low   GOALS: Goals reviewed with patient? Yes  SHORT TERM GOALS: Target date: 06/12/2023   Patient will be independent with initial HEP. Baseline:  Goal status: INITIAL  2.  Decreased hip pain with  walking by 50% Baseline:  Goal status: INITIAL    LONG TERM GOALS: Target date: 07/03/2023   Patient will be independent with advanced/ongoing HEP to improve outcomes and carryover.  Baseline:  Goal status: INITIAL  2.  Patient able to walk her normal walking program without pain.  Baseline:  Goal status: INITIAL  3.  Patient will demonstrate improved LE strength to 5/5. Baseline:  Goal status: INITIAL      PLAN:  PT FREQUENCY: 2x/week  PT DURATION: 6 weeks  PLANNED INTERVENTIONS: [id number]- PT Re-evaluation, 97110-Therapeutic exercises, 97530- Therapeutic activity, 97112- Neuromuscular re-education, 97535- Self Care, [id number]- Manual therapy, [phone]- Gait training, [phone]- Electrical stimulation (unattended), [phone]- Electrical stimulation (manual), [phone]- Ionotophoresis 4mg /ml Dexamethasone , Patient/Family education, Balance training,  Taping, Dry Needling, Joint mobilization, Spinal mobilization, Cryotherapy, and Moist heat  PLAN FOR NEXT SESSION: assess response to treatment session; continue hip strength and stability; manual/ DN as indicated (Needs aggressive hip abductor strengthening  per Dr. Elva Hamburger)   Penelope Bowie, PT 05/28/23 11:42 AM [facility] [address], [address] [place], [place] [address] Phone # [phone] Fax [phone]

## 2023-05-31 ENCOUNTER — Ambulatory Visit

## 2023-05-31 DIAGNOSIS — R293 Abnormal posture: Secondary | ICD-10-CM

## 2023-05-31 DIAGNOSIS — M25552 Pain in left hip: Secondary | ICD-10-CM | POA: Diagnosis not present

## 2023-05-31 DIAGNOSIS — R262 Difficulty in walking, not elsewhere classified: Secondary | ICD-10-CM | POA: Diagnosis not present

## 2023-05-31 DIAGNOSIS — M25551 Pain in right hip: Secondary | ICD-10-CM | POA: Diagnosis not present

## 2023-05-31 DIAGNOSIS — M6281 Muscle weakness (generalized): Secondary | ICD-10-CM

## 2023-05-31 DIAGNOSIS — R252 Cramp and spasm: Secondary | ICD-10-CM | POA: Diagnosis not present

## 2023-05-31 NOTE — Therapy (Signed)
 OUTPATIENT PHYSICAL THERAPY LOWER EXTREMITY TREATMENT   Patient Name: Christina Hayes MRN: [id number] DOB:07-May-1950, 73 y.o., female Today's Date: 05/31/2023  END OF SESSION:  PT End of Session - 05/31/23 0847     Visit Number 4    Date for PT Re-Evaluation 07/03/23    Authorization Type [employer] APPROVED #[id number] 8 VISITS 05/22/2023 - 07/03/2023    Authorization Time Period 05/22/2023 - 07/03/2023    Authorization - Visit Number 4    Authorization - Number of Visits 8    Progress Note Due on Visit 10    PT Start Time 0845    PT Stop Time 0930    PT Time Calculation (min) 45 min    Activity Tolerance Patient tolerated treatment well    Behavior During Therapy WFL for tasks assessed/performed               Past Medical History:  Diagnosis Date   Acid reflux    Allergic rhinitis, cause unspecified    Allergy    Anemia    years ago    Cataract    Complication of anesthesia SLOW TO WAKE   Cough    DJD (degenerative joint disease)    spine   Frequency of urination    H/O hiatal hernia    small   Head cold    Headache(784.0)    migraqine- uses essential oils   HYPERLIPIDEMIA, MIXED 12/03/2006   PT STATES NOT TAKING MED AS PRESCRIBED   MIGRAINES, HX OF 12/03/2006   Neuromuscular disorder (HCC)    Nocturia    OSA on CPAP    Osteopenia    PONV (postoperative nausea and vomiting)    in past   Shortness of breath    Sleep apnea    cpap   SUI (stress urinary incontinence, female)    Urgency of urination    VERTIGO, HX OF 12/03/2006   Past Surgical History:  Procedure Laterality Date   COLONOSCOPY     PUBOVAGINAL SLING  04/30/2011   Procedure: Gino Lais;  Surgeon: Livingston Rigg, MD;  Location: [facility];  Service: Urology;  Laterality: N/A;  sub urethral sling    possible ower   RIGID BRONCHOSCOPY N/A 04/23/2013   Procedure: LASER BRONCHOSCOPY;  Surgeon: Zelphia Higashi, MD;  Location: [facility] OR;  Service: Thoracic;  Laterality:  N/A;   SALPINGOOPHORECTOMY  1995   BILATERAL   VAGINAL HYSTERECTOMY  1988   VIDEO BRONCHOSCOPY Bilateral 03/26/2013   Procedure: VIDEO BRONCHOSCOPY WITHOUT FLUORO;  Surgeon: Diamond Formica, MD;  Location: [facility];  Service: Cardiopulmonary;  Laterality: Bilateral;   VIDEO BRONCHOSCOPY N/A 04/23/2013   Procedure: VIDEO BRONCHOSCOPY;  Surgeon: Zelphia Higashi, MD;  Location: [facility] OR;  Service: Thoracic;  Laterality: N/A;   VIDEO BRONCHOSCOPY N/A 05/28/2014   Procedure: VIDEO BRONCHOSCOPY;  Surgeon: Zelphia Higashi, MD;  Location: [facility] OR;  Service: Thoracic;  Laterality: N/A;   Patient Active Problem List   Diagnosis Date Noted   Greater trochanteric bursitis 05/08/2023   Prediabetes 10/23/2021   Osteoporosis 04/18/2020   Chronic rhinitis 12/28/2019   Osteoarthritis 10/18/2019   Weight gain 04/13/2019   GERD (gastroesophageal reflux disease) 04/13/2019   Insomnia 04/13/2019   DJD (degenerative joint disease) of knee 09/06/2015   Degenerative tear of medial meniscus 09/06/2015   Severe sleep apnea 07/21/2015   Osteopenia determined by x-ray 07/20/2015   Lumbago 06/15/2015   Left knee pain 06/10/2015   Carcinoid tumor 05/18/2014  Sleep pattern disturbance 01/26/2014   Obesity, unspecified 03/25/2012   HLD (hyperlipidemia) 12/03/2006   VERTIGO, HX OF 12/03/2006   Migraine 12/03/2006    PCP: Adela Holter, DO   REFERRING PROVIDER: Gean Keels,*   REFERRING DIAG: M70.60 (ICD-10-CM) - Greater trochanteric bursitis, unspecified laterality   THERAPY DIAG:  Pain of both hip joints  Cramp and spasm  Difficulty in walking, not elsewhere classified  Muscle weakness (generalized)  Abnormal posture  Rationale for Evaluation and Treatment: Rehabilitation  ONSET DATE: months ago  SUBJECTIVE:   SUBJECTIVE STATEMENT: Patient reports she is not having pain in the hips but is feeling some pain around the area of the left ischium and into her left lower  quadrant.  PERTINENT HISTORY: vertigo (please try to avoid any floor exercises) PAIN:  05/28/23 Are you having pain? Yes: NPRS scale: 0 today up to 4/10 Pain location: B hips Pain description: sharp Aggravating factors: walking, sometimes lying down, a little bit with stairs Relieving factors: resting, meds   PRECAUTIONS: None  RED FLAGS: None   WEIGHT BEARING RESTRICTIONS: No  FALLS:  Has patient fallen in last 6 months? Yes. Number of falls 1 6 months ago, she was moving/carrying something and she caught her toe and fell to her knees.  LIVING ENVIRONMENT: Lives with: lives with their spouse Lives in: House/apartment Stairs: Yes: External: 1 steps; none Has following equipment at home: None  OCCUPATION: retired  PLOF: Independent  PATIENT GOALS: not have pain  NEXT MD VISIT: 6 weeks  OBJECTIVE:  Note: Objective measures were completed at Evaluation unless otherwise noted.  DIAGNOSTIC FINDINGS: none  PATIENT SURVEYS:  The Patient-Specific Functional Scale  Initial:  I am going to ask you to identify up to 3 important activities that you are unable to do or are having difficulty with as a result of this problem.  Today are there any activities that you are unable to do or having difficulty with because of this?  (Patient shown scale and patient rated each activity)  Follow up: When you first came in you had difficulty performing these activities.  Today do you still have difficulty?  Patient-Specific activity scoring scheme (Point to one number):  0 1 2 3 4 5 6 7 8 9  10 Unable                                                                                                          Able to perform To perform                                                                                                    activity  at the same Activity         Level as before                                                                                                                        Injury or problem  Activity        Walking                                                                  Initial:         8              follow up:  2.                                                                            Initial:                       follow up:  3.                                                                                  Initial:                       follow up:     COGNITION: Overall cognitive status: Within functional limits for tasks assessed     SENSATION: WFL  MUSCLE LENGTH: mild tightness B HS and piriformis   POSTURE: No Significant postural limitations  PALPATION: Bil glute min, med and greater trochanters  LOWER EXTREMITY ROM: B LE WFL for tasks assessed   LOWER EXTREMITY MMT: hips 4+ to 5 B   FUNCTIONAL TESTS:  5 times sit to stand: 14.77 sec  TREATMENT DATE:   05/31/23 Nustep x 7 min Level 4(PT present to discuss status) At steps: lunge with overhead side flexion stretch x 5 each side holding 10 sec each Seated clam x 20 with yellow loop Hook lying clam x 20 with yellow loop Side lying clam x 20  (4 sets of 5 or 2 sets of 10) Iron cross stretch x 5 each side holding 10 seconds each 4 way straight leg raise with 2 lbs 4 sets of 5 on each leg for each motion Seated piriformis stretch 2 x 30 sec bilateral    05/28/23 Nustep x 6 min Level 4(PT present to discuss status) Seated piriformis stretch 2 x 30 sec bilateral  Side stepping with red loop at barre x 4 laps Sit to Stand with red loop around legs x 10 no weight x 10 5# KB hold 6 inch step ups with 5# unilateral KB hold x 10 bilateral  Hooklying hip abduction with red loop x 20 Hooklying hip adduction ball squeeze x 20 SLR x 10 bilateral  Bridging 2 x 10 (v/c for TA activation) Sidelying hip adduction 2 x 10 bilateral      05/24/23 Nustep x 7 min (PT present to discuss status) Reviewed all of HEP (patient needed multiple vc's for correct technique) Trigger Point Dry Needling Initial Treatment: Pt instructed on Dry Needling rational, procedures, and possible side effects. Pt instructed to expect mild to moderate muscle soreness later in the day and/or into the next day.  Pt instructed in methods to reduce muscle soreness. Pt instructed to continue prescribed HEP. Patient was educated on signs and symptoms of infection and other risk factors and advised to seek medical attention should they occur.  Patient verbalized understanding of these instructions and education.   Patient Verbal Consent Given: Yes Education Handout Provided: Previously Provided Muscles Treated: Glut max, med and min bilaterally Electrical Stimulation Performed: No Treatment Response/Outcome: Skilled palpation used to identify taut bands and trigger points.  Once identified, dry needling techniques used to treat these areas.  Twitch response ellicited along with palpable elongation of muscle.  Following treatment, patient reported minor soreness.     05/22/23  See pt ed and HEP   PATIENT EDUCATION:  Education details: PT eval findings, anticipated POC, initial HEP, and DN rational, procedure, outcomes, potential side effects, and recommended post-treatment exercises/activity  Person educated: Patient Education method: Explanation, Demonstration, and Handouts Education comprehension: verbalized understanding and returned demonstration  HOME EXERCISE PROGRAM: Access Code: [email or web address] URL: [email or web address] Date: 05/28/2023 Prepared by: Penelope Bowie  Exercises - Seated Piriformis Stretch with Trunk Bend  - 2 x daily - 7 x weekly - 1 sets - 3 reps - 30-60 sec  hold - Standing Isometric Hip Abduction with Mini Squat and Ball on Wall  - 1 x daily - 3 x weekly - 2 sets - 10 reps - Standing Hip Abduction with Resistance  at Ankles and Counter Support  - 1 x daily - 3 x weekly - 2 sets - 10 reps - Standing Hip Extension with Resistance at Ankles and Counter Support  - 1 x daily - 3 x weekly - 2 sets - 10 reps - Side Stepping with Resistance at Thighs  - 1 x daily - 7 x weekly - 4 sets - 10 reps - Sit to Stand Without Arm Support  - 1 x daily - 7 x weekly - 2 sets - 10 reps  ASSESSMENT:  CLINICAL IMPRESSION: Anselmo Kings is progressing appropriately.  She  denies any hip pain. Her left lower quadrant pain dissipated with stretching and exercise today.   Patient should continue to progress well with physical therapy. Patient will benefit from skilled PT to address the below impairments and improve overall function.   OBJECTIVE IMPAIRMENTS: decreased activity tolerance, decreased strength, increased muscle spasms, impaired flexibility, and pain.   ACTIVITY LIMITATIONS: locomotion level  PARTICIPATION LIMITATIONS: not limited but painful  PERSONAL FACTORS: Time since onset of injury/illness/exacerbation and 1 comorbidity: vertigo are also affecting patient's functional outcome.   REHAB POTENTIAL: Excellent  CLINICAL DECISION MAKING: Stable/uncomplicated  EVALUATION COMPLEXITY: Low   GOALS: Goals reviewed with patient? Yes  SHORT TERM GOALS: Target date: 06/12/2023   Patient will be independent with initial HEP. Baseline:  Goal status: MET 05/31/23  2.  Decreased hip pain with walking by 50% Baseline:  Goal status: MET 05/31/23    LONG TERM GOALS: Target date: 07/03/2023   Patient will be independent with advanced/ongoing HEP to improve outcomes and carryover.  Baseline:  Goal status: INITIAL  2.  Patient able to walk her normal walking program without pain.  Baseline:  Goal status: INITIAL  3.  Patient will demonstrate improved LE strength to 5/5. Baseline:  Goal status: INITIAL      PLAN:  PT FREQUENCY: 2x/week  PT DURATION: 6 weeks  PLANNED INTERVENTIONS: [id number]- PT Re-evaluation,  97110-Therapeutic exercises, 97530- Therapeutic activity, [id number]- Neuromuscular re-education, 97535- Self Care, [id number]- Manual therapy, [phone]- Gait training, [phone]- Electrical stimulation (unattended), [phone]- Electrical stimulation (manual), [phone]- Ionotophoresis 4mg /ml Dexamethasone , Patient/Family education, Balance training, Taping, Dry Needling, Joint mobilization, Spinal mobilization, Cryotherapy, and Moist heat  PLAN FOR NEXT SESSION: assess response to adding 4 way hip (pt was struggling); continue hip strength and stability; manual/ DN as indicated (Needs aggressive hip abductor strengthening  per Dr. Elva Hamburger)   [facility] B. Rakesh Dutko, PT 05/31/23 11:01 AM [facility] [address] [place], [place] [address] Phone # [phone] Fax [phone]

## 2023-06-05 ENCOUNTER — Ambulatory Visit

## 2023-06-07 ENCOUNTER — Other Ambulatory Visit: Payer: Self-pay | Admitting: Thoracic Surgery (Cardiothoracic Vascular Surgery)

## 2023-06-07 ENCOUNTER — Other Ambulatory Visit: Payer: Self-pay | Admitting: Allergy

## 2023-06-07 ENCOUNTER — Ambulatory Visit: Admitting: Physical Therapy

## 2023-06-07 ENCOUNTER — Other Ambulatory Visit: Payer: Self-pay | Admitting: Family Medicine

## 2023-06-07 ENCOUNTER — Encounter: Payer: Self-pay | Admitting: Physical Therapy

## 2023-06-07 DIAGNOSIS — R252 Cramp and spasm: Secondary | ICD-10-CM

## 2023-06-07 DIAGNOSIS — M25551 Pain in right hip: Secondary | ICD-10-CM

## 2023-06-07 DIAGNOSIS — R293 Abnormal posture: Secondary | ICD-10-CM | POA: Diagnosis not present

## 2023-06-07 DIAGNOSIS — C7A090 Malignant carcinoid tumor of the bronchus and lung: Secondary | ICD-10-CM

## 2023-06-07 DIAGNOSIS — R262 Difficulty in walking, not elsewhere classified: Secondary | ICD-10-CM | POA: Diagnosis not present

## 2023-06-07 DIAGNOSIS — M6281 Muscle weakness (generalized): Secondary | ICD-10-CM

## 2023-06-07 DIAGNOSIS — M25552 Pain in left hip: Secondary | ICD-10-CM | POA: Diagnosis not present

## 2023-06-07 DIAGNOSIS — R918 Other nonspecific abnormal finding of lung field: Secondary | ICD-10-CM

## 2023-06-07 NOTE — Therapy (Signed)
 OUTPATIENT PHYSICAL THERAPY LOWER EXTREMITY TREATMENT   Patient Name: Christina Hayes MRN: [id number] DOB:[birth year], 73 y.o., female Today's Date: 06/07/2023  END OF SESSION:  PT End of Session - 06/07/23 0802     Visit Number 5    Date for PT Re-Evaluation 07/03/23    Authorization Type [employer] APPROVED [id number] 8 VISITS 05/22/2023 - 07/03/2023    Authorization Time Period 05/22/2023 - 07/03/2023    Authorization - Visit Number 5    Authorization - Number of Visits 8    Progress Note Due on Visit 10    PT Start Time 0800    PT Stop Time 0844    PT Time Calculation (min) 44 min    Activity Tolerance Patient tolerated treatment well    Behavior During Therapy WFL for tasks assessed/performed                Past Medical History:  Diagnosis Date   Acid reflux    Allergic rhinitis, cause unspecified    Allergy    Anemia    years ago    Cataract    Complication of anesthesia SLOW TO WAKE   Cough    DJD (degenerative joint disease)    spine   Frequency of urination    H/O hiatal hernia    small   Head cold    Headache(784.0)    migraqine- uses essential oils   HYPERLIPIDEMIA, MIXED 12/03/2006   PT STATES NOT TAKING MED AS PRESCRIBED   MIGRAINES, HX OF 12/03/2006   Neuromuscular disorder (HCC)    Nocturia    OSA on CPAP    Osteopenia    PONV (postoperative nausea and vomiting)    in past   Shortness of breath    Sleep apnea    cpap   SUI (stress urinary incontinence, female)    Urgency of urination    VERTIGO, HX OF 12/03/2006   Past Surgical History:  Procedure Laterality Date   COLONOSCOPY     PUBOVAGINAL SLING  04/30/2011   Procedure: Gino Lais;  Surgeon: Livingston Rigg, MD;  Location: [facility];  Service: Urology;  Laterality: N/A;  sub urethral sling    possible ower   RIGID BRONCHOSCOPY N/A 04/23/2013   Procedure: LASER BRONCHOSCOPY;  Surgeon: Zelphia Higashi, MD;  Location: [facility] OR;  Service: Thoracic;  Laterality:  N/A;   SALPINGOOPHORECTOMY  1995   BILATERAL   VAGINAL HYSTERECTOMY  1988   VIDEO BRONCHOSCOPY Bilateral 03/26/2013   Procedure: VIDEO BRONCHOSCOPY WITHOUT FLUORO;  Surgeon: Diamond Formica, MD;  Location: [facility];  Service: Cardiopulmonary;  Laterality: Bilateral;   VIDEO BRONCHOSCOPY N/A 04/23/2013   Procedure: VIDEO BRONCHOSCOPY;  Surgeon: Zelphia Higashi, MD;  Location: [facility] OR;  Service: Thoracic;  Laterality: N/A;   VIDEO BRONCHOSCOPY N/A 05/28/2014   Procedure: VIDEO BRONCHOSCOPY;  Surgeon: Zelphia Higashi, MD;  Location: [facility] OR;  Service: Thoracic;  Laterality: N/A;   Patient Active Problem List   Diagnosis Date Noted   Greater trochanteric bursitis 05/08/2023   Prediabetes 10/23/2021   Osteoporosis 04/18/2020   Chronic rhinitis 12/28/2019   Osteoarthritis 10/18/2019   Weight gain 04/13/2019   GERD (gastroesophageal reflux disease) 04/13/2019   Insomnia 04/13/2019   DJD (degenerative joint disease) of knee 09/06/2015   Degenerative tear of medial meniscus 09/06/2015   Severe sleep apnea 07/21/2015   Osteopenia determined by x-ray 07/20/2015   Lumbago 06/15/2015   Left knee pain 06/10/2015   Carcinoid tumor 05/18/2014  Sleep pattern disturbance 01/26/2014   Obesity, unspecified 03/25/2012   HLD (hyperlipidemia) 12/03/2006   VERTIGO, HX OF 12/03/2006   Migraine 12/03/2006    PCP: Adela Holter, DO   REFERRING PROVIDER: Gean Keels,*   REFERRING DIAG: M70.60 (ICD-10-CM) - Greater trochanteric bursitis, unspecified laterality   THERAPY DIAG:  Pain of both hip joints  Cramp and spasm  Difficulty in walking, not elsewhere classified  Muscle weakness (generalized)  Abnormal posture  Rationale for Evaluation and Treatment: Rehabilitation  ONSET DATE: months ago  SUBJECTIVE:   SUBJECTIVE STATEMENT: Pt states she continues to have increased LBP - she has had LBP in the past but it wasn't hurting prior to starting PT.  She is  interested in trying the DN again.  The hips are hurting somewhat too but it is secondary to the back. Back pain is a 5/10 but hips are 1/10  PERTINENT HISTORY: vertigo (please try to avoid any floor exercises) PAIN:  05/28/23 Are you having pain? Yes: NPRS scale:  1/10 hips, LBP 5/10 Pain location: B hips Pain description: sharp Aggravating factors: walking, sometimes lying down, a little bit with stairs Relieving factors: resting, meds   PRECAUTIONS: None  RED FLAGS: None   WEIGHT BEARING RESTRICTIONS: No  FALLS:  Has patient fallen in last 6 months? Yes. Number of falls 1 6 months ago, she was moving/carrying something and she caught her toe and fell to her knees.  LIVING ENVIRONMENT: Lives with: lives with their spouse Lives in: House/apartment Stairs: Yes: External: 1 steps; none Has following equipment at home: None  OCCUPATION: retired  PLOF: Independent  PATIENT GOALS: not have pain  NEXT MD VISIT: 6 weeks  OBJECTIVE:  Note: Objective measures were completed at Evaluation unless otherwise noted.  DIAGNOSTIC FINDINGS: none  PATIENT SURVEYS:  The Patient-Specific Functional Scale  Initial:  I am going to ask you to identify up to 3 important activities that you are unable to do or are having difficulty with as a result of this problem.  Today are there any activities that you are unable to do or having difficulty with because of this?  (Patient shown scale and patient rated each activity)  Follow up: When you first came in you had difficulty performing these activities.  Today do you still have difficulty?  Patient-Specific activity scoring scheme (Point to one number):  0 1 2 3 4 5 6 7 8 9  10 Unable                                                                                                          Able to perform To perform  activity at the  same Activity         Level as before                                                                                                                       Injury or problem  Activity        Walking                                                                  Initial:         8              follow up:  2.                                                                            Initial:                       follow up:  3.                                                                                  Initial:                       follow up:     COGNITION: Overall cognitive status: Within functional limits for tasks assessed     SENSATION: WFL  MUSCLE LENGTH: mild tightness B HS and piriformis   POSTURE: No Significant postural limitations  PALPATION: Bil glute min, med and greater trochanters  LOWER EXTREMITY ROM: B LE WFL for tasks assessed   LOWER EXTREMITY MMT: hips 4+ to 5 B   FUNCTIONAL TESTS:  5 times sit to stand: 14.77 sec  TREATMENT DATE:   06/07/23 NuStep L4 x 6' PT present to discuss status and plan for session Seated ball rollouts for lumbar flexion stretch x8 Seated piriformis stretch edge of table (seated pigeon) x30" each side Hip matrix march with VC for indraw of abdominals 25lb x10 each LE Hip matrix abd 25lb x8 each LE Seated core with yellow weighted ball x6 reps each: L press, hip to opp shoulder, sit to stand with V press Trigger Point Dry Needling  Subsequent Treatment: Instructions reviewed, if requested by the patient, prior to subsequent dry needling treatment.   Patient Verbal Consent Given: Yes Education Handout Provided: Previously Provided Muscles Treated: bil lumbar multifidi, glut max, med and min, piriformis Electrical Stimulation Performed: No Treatment Response/Outcome: signif twitch, ache  and release of tension - skilled palpation used pre and post DN to find TP and assess outcome Prone manual traction top-down hands at pelvis and sacrum pressing away from L5   05/31/23 Nustep x 7 min Level 4(PT present to discuss status) At steps: lunge with overhead side flexion stretch x 5 each side holding 10 sec each Seated clam x 20 with yellow loop Hook lying clam x 20 with yellow loop Side lying clam x 20  (4 sets of 5 or 2 sets of 10) Iron cross stretch x 5 each side holding 10 seconds each 4 way straight leg raise with 2 lbs 4 sets of 5 on each leg for each motion Seated piriformis stretch 2 x 30 sec bilateral    05/28/23 Nustep x 6 min Level 4(PT present to discuss status) Seated piriformis stretch 2 x 30 sec bilateral  Side stepping with red loop at barre x 4 laps Sit to Stand with red loop around legs x 10 no weight x 10 5# KB hold 6 inch step ups with 5# unilateral KB hold x 10 bilateral  Hooklying hip abduction with red loop x 20 Hooklying hip adduction ball squeeze x 20 SLR x 10 bilateral  Bridging 2 x 10 (v/c for TA activation) Sidelying hip adduction 2 x 10 bilateral    PATIENT EDUCATION:  Education details: PT eval findings, anticipated POC, initial HEP, and DN rational, procedure, outcomes, potential side effects, and recommended post-treatment exercises/activity  Person educated: Patient Education method: Explanation, Demonstration, and Handouts Education comprehension: verbalized understanding and returned demonstration  HOME EXERCISE PROGRAM: Access Code: [email or web address] URL: [email or web address] Date: 05/28/2023 Prepared by: Penelope Bowie  Exercises - Seated Piriformis Stretch with Trunk Bend  - 2 x daily - 7 x weekly - 1 sets - 3 reps - 30-60 sec  hold - Standing Isometric Hip Abduction with Mini Squat and Ball on Wall  - 1 x daily - 3 x weekly - 2 sets - 10 reps - Standing Hip Abduction with Resistance at Ankles and Counter Support  - 1 x daily -  3 x weekly - 2 sets - 10 reps - Standing Hip Extension with Resistance at Ankles and Counter Support  - 1 x daily - 3 x weekly - 2 sets - 10 reps - Side Stepping with Resistance at Thighs  - 1 x daily - 7 x weekly - 4 sets - 10 reps - Sit to Stand Without Arm Support  - 1 x daily - 7 x weekly - 2 sets - 10 reps  ASSESSMENT:  CLINICAL IMPRESSION: Christina Hayes has had a flare up of LBP she didn't have prior to starting PT, although she has had episodes in the past.  Her hip pain  is down to 1/10 but new onset of LBP is 5/10.  PT repeated hip DN and added lumbar DN today with good response.  Pt had signif twitch, ache and release with improved pain end of session.  Prior to DN she was able to perform stretches and strength for core and hips.  She needs continued core work to help with LBP and co-contraction with hip strength.  She is quick to fatigue in closed chain stabilization on hip matrix on stance leg so kept reps low.     OBJECTIVE IMPAIRMENTS: decreased activity tolerance, decreased strength, increased muscle spasms, impaired flexibility, and pain.   ACTIVITY LIMITATIONS: locomotion level  PARTICIPATION LIMITATIONS: not limited but painful  PERSONAL FACTORS: Time since onset of injury/illness/exacerbation and 1 comorbidity: vertigo are also affecting patient's functional outcome.   REHAB POTENTIAL: Excellent  CLINICAL DECISION MAKING: Stable/uncomplicated  EVALUATION COMPLEXITY: Low   GOALS: Goals reviewed with patient? Yes  SHORT TERM GOALS: Target date: 06/12/2023   Patient will be independent with initial HEP. Baseline:  Goal status: MET 05/31/23  2.  Decreased hip pain with walking by 50% Baseline:  Goal status: MET 05/31/23    LONG TERM GOALS: Target date: 07/03/2023   Patient will be independent with advanced/ongoing HEP to improve outcomes and carryover.  Baseline:  Goal status: INITIAL  2.  Patient able to walk her normal walking program without pain.  Baseline:  Goal  status: INITIAL  3.  Patient will demonstrate improved LE strength to 5/5. Baseline:  Goal status: INITIAL      PLAN:  PT FREQUENCY: 2x/week  PT DURATION: 6 weeks  PLANNED INTERVENTIONS: 19147- PT Re-evaluation, 97110-Therapeutic exercises, 97530- Therapeutic activity, 97112- Neuromuscular re-education, 97535- Self Care, 82956- Manual therapy, [phone]- Gait training, [phone]- Electrical stimulation (unattended), [phone]- Electrical stimulation (manual), [phone]- Ionotophoresis 4mg /ml Dexamethasone , Patient/Family education, Balance training, Taping, Dry Needling, Joint mobilization, Spinal mobilization, Cryotherapy, and Moist heat  PLAN FOR NEXT SESSION: target core but try to avoid supine due to vertigo history, f/u on DN #2 which included lumbar, continue hip strength and stability; manual/ DN as indicated (Needs aggressive hip abductor strengthening  per Dr. Elva Hamburger)   Raynell Caller, PT 06/07/23 8:48 AM  [facility] [address] [place], [place] [address] Phone # [phone] Fax [phone]

## 2023-06-11 NOTE — Telephone Encounter (Signed)
 Courtesy refill for ipratropium 0.03%  nasal spray and levocetirizine 5 mg x 1 with no refills. Patient is due for a yearly OV.

## 2023-06-12 ENCOUNTER — Other Ambulatory Visit: Payer: Self-pay

## 2023-06-12 ENCOUNTER — Ambulatory Visit

## 2023-06-12 DIAGNOSIS — R262 Difficulty in walking, not elsewhere classified: Secondary | ICD-10-CM

## 2023-06-12 DIAGNOSIS — R252 Cramp and spasm: Secondary | ICD-10-CM | POA: Diagnosis not present

## 2023-06-12 DIAGNOSIS — R293 Abnormal posture: Secondary | ICD-10-CM

## 2023-06-12 DIAGNOSIS — M6281 Muscle weakness (generalized): Secondary | ICD-10-CM | POA: Diagnosis not present

## 2023-06-12 DIAGNOSIS — M25551 Pain in right hip: Secondary | ICD-10-CM

## 2023-06-12 DIAGNOSIS — M25552 Pain in left hip: Secondary | ICD-10-CM | POA: Diagnosis not present

## 2023-06-12 NOTE — Therapy (Signed)
 OUTPATIENT PHYSICAL THERAPY LOWER EXTREMITY TREATMENT   Patient Name: Christina Hayes MRN: [id number] DOB:1950/08/30, 73 y.o., female Today's Date: 06/12/2023  END OF SESSION:  PT End of Session - 06/12/23 1535     Visit Number 6    Date for PT Re-Evaluation 07/03/23    Authorization Type [employer] APPROVED [id number] VISITS 05/22/2023 - 07/03/2023    Authorization Time Period 05/22/2023 - 07/03/2023    Authorization - Visit Number 6    Authorization - Number of Visits 8    Progress Note Due on Visit 10    PT Start Time 1534    PT Stop Time 1618    PT Time Calculation (min) 44 min    Activity Tolerance Patient tolerated treatment well    Behavior During Therapy WFL for tasks assessed/performed                Past Medical History:  Diagnosis Date   Acid reflux    Allergic rhinitis, cause unspecified    Allergy    Anemia    years ago    Cataract    Complication of anesthesia SLOW TO WAKE   Cough    DJD (degenerative joint disease)    spine   Frequency of urination    H/O hiatal hernia    small   Head cold    Headache(784.0)    migraqine- uses essential oils   HYPERLIPIDEMIA, MIXED 12/03/2006   PT STATES NOT TAKING MED AS PRESCRIBED   MIGRAINES, HX OF 12/03/2006   Neuromuscular disorder (HCC)    Nocturia    OSA on CPAP    Osteopenia    PONV (postoperative nausea and vomiting)    in past   Shortness of breath    Sleep apnea    cpap   SUI (stress urinary incontinence, female)    Urgency of urination    VERTIGO, HX OF 12/03/2006   Past Surgical History:  Procedure Laterality Date   COLONOSCOPY     PUBOVAGINAL SLING  04/30/2011   Procedure: Gino Lais;  Surgeon: Livingston Rigg, MD;  Location: [facility];  Service: Urology;  Laterality: N/A;  sub urethral sling    possible ower   RIGID BRONCHOSCOPY N/A 04/23/2013   Procedure: LASER BRONCHOSCOPY;  Surgeon: Zelphia Higashi, MD;  Location: [facility] OR;  Service: Thoracic;  Laterality:  N/A;   SALPINGOOPHORECTOMY  1995   BILATERAL   VAGINAL HYSTERECTOMY  1988   VIDEO BRONCHOSCOPY Bilateral 03/26/2013   Procedure: VIDEO BRONCHOSCOPY WITHOUT FLUORO;  Surgeon: Diamond Formica, MD;  Location: [facility];  Service: Cardiopulmonary;  Laterality: Bilateral;   VIDEO BRONCHOSCOPY N/A 04/23/2013   Procedure: VIDEO BRONCHOSCOPY;  Surgeon: Zelphia Higashi, MD;  Location: [facility] OR;  Service: Thoracic;  Laterality: N/A;   VIDEO BRONCHOSCOPY N/A 05/28/2014   Procedure: VIDEO BRONCHOSCOPY;  Surgeon: Zelphia Higashi, MD;  Location: [facility] OR;  Service: Thoracic;  Laterality: N/A;   Patient Active Problem List   Diagnosis Date Noted   Greater trochanteric bursitis 05/08/2023   Prediabetes 10/23/2021   Osteoporosis 04/18/2020   Chronic rhinitis 12/28/2019   Osteoarthritis 10/18/2019   Weight gain 04/13/2019   GERD (gastroesophageal reflux disease) 04/13/2019   Insomnia 04/13/2019   DJD (degenerative joint disease) of knee 09/06/2015   Degenerative tear of medial meniscus 09/06/2015   Severe sleep apnea 07/21/2015   Osteopenia determined by x-ray 07/20/2015   Lumbago 06/15/2015   Left knee pain 06/10/2015   Carcinoid tumor 05/18/2014  Sleep pattern disturbance 01/26/2014   Obesity, unspecified 03/25/2012   HLD (hyperlipidemia) 12/03/2006   VERTIGO, HX OF 12/03/2006   Migraine 12/03/2006    PCP: Adela Holter, DO   REFERRING PROVIDER: Gean Keels,*   REFERRING DIAG: M70.60 (ICD-10-CM) - Greater trochanteric bursitis, unspecified laterality   THERAPY DIAG:  Pain of both hip joints  Cramp and spasm  Difficulty in walking, not elsewhere classified  Muscle weakness (generalized)  Abnormal posture  Rationale for Evaluation and Treatment: Rehabilitation  ONSET DATE: months ago  SUBJECTIVE:   SUBJECTIVE STATEMENT: Pt reports she is about 50% better from the time she started PT.  Back pain is a 5/10 but hips are 3/10  PERTINENT HISTORY: vertigo  (please try to avoid any floor exercises) PAIN:  06/12/23 Are you having pain? Yes: NPRS scale:  3/10 hips, LBP 5/10 Pain location: B hips Pain description: sharp Aggravating factors: walking, sometimes lying down, a little bit with stairs Relieving factors: resting, meds   PRECAUTIONS: None  RED FLAGS: None   WEIGHT BEARING RESTRICTIONS: No  FALLS:  Has patient fallen in last 6 months? Yes. Number of falls 1 6 months ago, she was moving/carrying something and she caught her toe and fell to her knees.  LIVING ENVIRONMENT: Lives with: lives with their spouse Lives in: House/apartment Stairs: Yes: External: 1 steps; none Has following equipment at home: None  OCCUPATION: retired  PLOF: Independent  PATIENT GOALS: not have pain  NEXT MD VISIT: 6 weeks  OBJECTIVE:  Note: Objective measures were completed at Evaluation unless otherwise noted.  DIAGNOSTIC FINDINGS: none  PATIENT SURVEYS:  The Patient-Specific Functional Scale  Initial:  I am going to ask you to identify up to 3 important activities that you are unable to do or are having difficulty with as a result of this problem.  Today are there any activities that you are unable to do or having difficulty with because of this?  (Patient shown scale and patient rated each activity)  Follow up: When you first came in you had difficulty performing these activities.  Today do you still have difficulty?  Patient-Specific activity scoring scheme (Point to one number):  0 1 2 3 4 5 6 7 8 9  10 Unable                                                                                                          Able to perform To perform                                                                                                    activity at the same Activity  Level as before                                                                                                                       Injury or  problem  Activity        Walking                                                                  Initial:         8              follow up:  2.                                                                            Initial:                       follow up:  3.                                                                                  Initial:                       follow up:     COGNITION: Overall cognitive status: Within functional limits for tasks assessed     SENSATION: WFL  MUSCLE LENGTH: mild tightness B HS and piriformis   POSTURE: No Significant postural limitations  PALPATION: Bil glute min, med and greater trochanters  LOWER EXTREMITY ROM: B LE WFL for tasks assessed   LOWER EXTREMITY MMT: hips 4+ to 5 B   FUNCTIONAL TESTS:  5 times sit to stand: 14.77 sec  TREATMENT DATE:   06/12/23 NuStep L4 x 6' PT present to discuss status and plan for session Seated ball rollouts (3 directions)  for lumbar flexion stretch x10 each direction Lateral band walks x 3 laps of 10 steps  Hook lying clamshell x 20 Side lying clam 2 x 10 each side Hip matrix (hip abduction and extension) 25 lb 2 x 10 each LE Trigger Point Dry Needling Subsequent Treatment: Instructions reviewed, if requested by the patient, prior to subsequent dry needling treatment.  Patient Verbal Consent Given: Yes Education Handout Provided: Previously Provided Muscles Treated: bil lumbar multifidi, glut max, med and min, piriformis Electrical Stimulation Performed: No Treatment Response/Outcome: signif twitch, ache and release of tension - skilled palpation used pre and post DN to find TP and assess outcome   06/07/23 NuStep L4 x 6' PT present to discuss status and plan for session Seated ball rollouts for lumbar flexion stretch x8 Seated piriformis stretch edge of table  (seated pigeon) x30" each side Hip matrix march with VC for indraw of abdominals 25lb x10 each LE Hip matrix abd 25lb x8 each LE Seated core with yellow weighted ball x6 reps each: L press, hip to opp shoulder, sit to stand with V press Trigger Point Dry Needling  Subsequent Treatment: Instructions reviewed, if requested by the patient, prior to subsequent dry needling treatment.   Patient Verbal Consent Given: Yes Education Handout Provided: Previously Provided Muscles Treated: bil lumbar multifidi, glut max, med and min, piriformis Electrical Stimulation Performed: No Treatment Response/Outcome: signif twitch, ache and release of tension - skilled palpation used pre and post DN to find TP and assess outcome Prone manual traction top-down hands at pelvis and sacrum pressing away from L5   05/31/23 Nustep x 7 min Level 4(PT present to discuss status) At steps: lunge with overhead side flexion stretch x 5 each side holding 10 sec each Seated clam x 20 with yellow loop Hook lying clam x 20 with yellow loop Side lying clam x 20  (4 sets of 5 or 2 sets of 10) Iron cross stretch x 5 each side holding 10 seconds each 4 way straight leg raise with 2 lbs 4 sets of 5 on each leg for each motion Seated piriformis stretch 2 x 30 sec bilateral    PATIENT EDUCATION:  Education details: PT eval findings, anticipated POC, initial HEP, and DN rational, procedure, outcomes, potential side effects, and recommended post-treatment exercises/activity  Person educated: Patient Education method: Explanation, Demonstration, and Handouts Education comprehension: verbalized understanding and returned demonstration  HOME EXERCISE PROGRAM: Access Code: [email or web address] URL: [email or web address] Date: 05/28/2023 Prepared by: Penelope Bowie  Exercises - Seated Piriformis Stretch with Trunk Bend  - 2 x daily - 7 x weekly - 1 sets - 3 reps - 30-60 sec  hold - Standing Isometric Hip Abduction with Mini  Squat and Ball on Wall  - 1 x daily - 3 x weekly - 2 sets - 10 reps - Standing Hip Abduction with Resistance at Ankles and Counter Support  - 1 x daily - 3 x weekly - 2 sets - 10 reps - Standing Hip Extension with Resistance at Ankles and Counter Support  - 1 x daily - 3 x weekly - 2 sets - 10 reps - Side Stepping with Resistance at Thighs  - 1 x daily - 7 x weekly - 4 sets - 10 reps - Sit to Stand Without Arm Support  - 1 x daily - 7 x weekly - 2 sets -  10 reps  ASSESSMENT:  CLINICAL IMPRESSION: Anselmo Kings is progressing appropriately.  She continues to have significant weakness in bilateral hips.    She needs continued core work to help with LBP and co-contraction with hip strength.  She is quick to fatigue in closed chain stabilization on hip matrix on stance leg so kept reps low.  She would benefit from continued skilled PT for LE strengthening and hip stability.     OBJECTIVE IMPAIRMENTS: decreased activity tolerance, decreased strength, increased muscle spasms, impaired flexibility, and pain.   ACTIVITY LIMITATIONS: locomotion level  PARTICIPATION LIMITATIONS: not limited but painful  PERSONAL FACTORS: Time since onset of injury/illness/exacerbation and 1 comorbidity: vertigo are also affecting patient's functional outcome.   REHAB POTENTIAL: Excellent  CLINICAL DECISION MAKING: Stable/uncomplicated  EVALUATION COMPLEXITY: Low   GOALS: Goals reviewed with patient? Yes  SHORT TERM GOALS: Target date: 06/12/2023   Patient will be independent with initial HEP. Baseline:  Goal status: MET 05/31/23  2.  Decreased hip pain with walking by 50% Baseline:  Goal status: MET 05/31/23    LONG TERM GOALS: Target date: 07/03/2023   Patient will be independent with advanced/ongoing HEP to improve outcomes and carryover.  Baseline:  Goal status: INITIAL  2.  Patient able to walk her normal walking program without pain.  Baseline:  Goal status: INITIAL  3.  Patient will demonstrate  improved LE strength to 5/5. Baseline:  Goal status: INITIAL      PLAN:  PT FREQUENCY: 2x/week  PT DURATION: 6 weeks  PLANNED INTERVENTIONS: 97164- PT Re-evaluation, 97110-Therapeutic exercises, 97530- Therapeutic activity, [id number]- Neuromuscular re-education, 97535- Self Care, [id number]- Manual therapy, [phone]- Gait training, [phone]- Electrical stimulation (unattended), [phone]- Electrical stimulation (manual), [phone]- Ionotophoresis 4mg /ml Dexamethasone , Patient/Family education, Balance training, Taping, Dry Needling, Joint mobilization, Spinal mobilization, Cryotherapy, and Moist heat  PLAN FOR NEXT SESSION: target core but modify supine due to vertigo history, f/u on DN #2 which included lumbar, continue hip strength and stability; manual/ DN as indicated (Needs aggressive hip abductor strengthening  per Dr. Elva Hamburger)   [facility] B. Tesla Keeler, PT 06/12/23 5:12 PM [facility] [address] [place], [place] [address] Phone # [phone] Fax [phone]

## 2023-06-13 ENCOUNTER — Telehealth: Payer: Self-pay

## 2023-06-13 NOTE — Telephone Encounter (Signed)
 I had switched her to voltaren  recently, did meloxicam  work better?

## 2023-06-13 NOTE — Telephone Encounter (Signed)
 Received a fax from [facility]  Requesting rx rf of meloxicam  7.5mg  medication not showing in patient current medication list  Last written 01/29/2023 Lasat OV

## 2023-06-14 NOTE — Telephone Encounter (Signed)
 Spoke with patient . States the Meloxicam  was an automatic refill request from the pharmacy . She will deactivate this prescription with [facility] - was not requested by her.

## 2023-06-14 NOTE — Telephone Encounter (Signed)
 Attempted call to patient. Left a voice mail message requesting a return call.

## 2023-06-19 ENCOUNTER — Ambulatory Visit

## 2023-06-19 DIAGNOSIS — M6281 Muscle weakness (generalized): Secondary | ICD-10-CM | POA: Diagnosis not present

## 2023-06-19 DIAGNOSIS — R252 Cramp and spasm: Secondary | ICD-10-CM | POA: Diagnosis not present

## 2023-06-19 DIAGNOSIS — M25551 Pain in right hip: Secondary | ICD-10-CM | POA: Diagnosis not present

## 2023-06-19 DIAGNOSIS — M25552 Pain in left hip: Secondary | ICD-10-CM | POA: Diagnosis not present

## 2023-06-19 DIAGNOSIS — R262 Difficulty in walking, not elsewhere classified: Secondary | ICD-10-CM | POA: Diagnosis not present

## 2023-06-19 DIAGNOSIS — R293 Abnormal posture: Secondary | ICD-10-CM

## 2023-06-19 NOTE — Therapy (Signed)
 OUTPATIENT PHYSICAL THERAPY LOWER EXTREMITY TREATMENT   Patient Name: Christina Hayes MRN: [id number] DOB:[birth year], 73 y.o., female Today's Date: 06/19/2023  END OF SESSION:  PT End of Session - 06/19/23 1402     Visit Number 7    Number of Visits 8    Date for PT Re-Evaluation 07/03/23    Authorization Type [employer] APPROVED #[id number] 8 VISITS 05/22/2023 - 07/03/2023    Authorization Time Period 05/22/2023 - 07/03/2023    Authorization - Visit Number 7    Authorization - Number of Visits 8    Progress Note Due on Visit 10    PT Start Time 1403    PT Stop Time 1445    PT Time Calculation (min) 42 min    Activity Tolerance Patient tolerated treatment well    Behavior During Therapy WFL for tasks assessed/performed                Past Medical History:  Diagnosis Date   Acid reflux    Allergic rhinitis, cause unspecified    Allergy    Anemia    years ago    Cataract    Complication of anesthesia SLOW TO WAKE   Cough    DJD (degenerative joint disease)    spine   Frequency of urination    H/O hiatal hernia    small   Head cold    Headache(784.0)    migraqine- uses essential oils   HYPERLIPIDEMIA, MIXED 12/03/2006   PT STATES NOT TAKING MED AS PRESCRIBED   MIGRAINES, HX OF 12/03/2006   Neuromuscular disorder (HCC)    Nocturia    OSA on CPAP    Osteopenia    PONV (postoperative nausea and vomiting)    in past   Shortness of breath    Sleep apnea    cpap   SUI (stress urinary incontinence, female)    Urgency of urination    VERTIGO, HX OF 12/03/2006   Past Surgical History:  Procedure Laterality Date   COLONOSCOPY     PUBOVAGINAL SLING  04/30/2011   Procedure: Gino Lais;  Surgeon: Livingston Rigg, MD;  Location: [facility];  Service: Urology;  Laterality: N/A;  sub urethral sling    possible ower   RIGID BRONCHOSCOPY N/A 04/23/2013   Procedure: LASER BRONCHOSCOPY;  Surgeon: Zelphia Higashi, MD;  Location: [facility] OR;  Service:  Thoracic;  Laterality: N/A;   SALPINGOOPHORECTOMY  1995   BILATERAL   VAGINAL HYSTERECTOMY  1988   VIDEO BRONCHOSCOPY Bilateral 03/26/2013   Procedure: VIDEO BRONCHOSCOPY WITHOUT FLUORO;  Surgeon: Diamond Formica, MD;  Location: [facility];  Service: Cardiopulmonary;  Laterality: Bilateral;   VIDEO BRONCHOSCOPY N/A 04/23/2013   Procedure: VIDEO BRONCHOSCOPY;  Surgeon: Zelphia Higashi, MD;  Location: [facility] OR;  Service: Thoracic;  Laterality: N/A;   VIDEO BRONCHOSCOPY N/A 05/28/2014   Procedure: VIDEO BRONCHOSCOPY;  Surgeon: Zelphia Higashi, MD;  Location: [facility] OR;  Service: Thoracic;  Laterality: N/A;   Patient Active Problem List   Diagnosis Date Noted   Greater trochanteric bursitis 05/08/2023   Prediabetes 10/23/2021   Osteoporosis 04/18/2020   Chronic rhinitis 12/28/2019   Osteoarthritis 10/18/2019   Weight gain 04/13/2019   GERD (gastroesophageal reflux disease) 04/13/2019   Insomnia 04/13/2019   DJD (degenerative joint disease) of knee 09/06/2015   Degenerative tear of medial meniscus 09/06/2015   Severe sleep apnea 07/21/2015   Osteopenia determined by x-ray 07/20/2015   Lumbago 06/15/2015   Left knee pain  06/10/2015   Carcinoid tumor 05/18/2014   Sleep pattern disturbance 01/26/2014   Obesity, unspecified 03/25/2012   HLD (hyperlipidemia) 12/03/2006   VERTIGO, HX OF 12/03/2006   Migraine 12/03/2006    PCP: Adela Holter, DO   REFERRING PROVIDER: Gean Keels,*   REFERRING DIAG: M70.60 (ICD-10-CM) - Greater trochanteric bursitis, unspecified laterality   THERAPY DIAG:  Pain of both hip joints  Cramp and spasm  Difficulty in walking, not elsewhere classified  Muscle weakness (generalized)  Abnormal posture  Rationale for Evaluation and Treatment: Rehabilitation  ONSET DATE: months ago  SUBJECTIVE:   SUBJECTIVE STATEMENT: Pt reports she is having some pain in the left low back.  I don't know if I slept wrong or something but its just  been rough the past couple of days.  She admits she only does "a little bit of this and a little bit of that" on her HEP.  Back pain is a 7/10 but hips are 0/10.  PERTINENT HISTORY: vertigo (please try to avoid any floor exercises) PAIN:  06/19/23 Are you having pain? Yes: NPRS scale:  0/10 hips, LBP 7/10 Pain location: B hips Pain description: sharp Aggravating factors: walking, sometimes lying down, a little bit with stairs Relieving factors: resting, meds   PRECAUTIONS: None  RED FLAGS: None   WEIGHT BEARING RESTRICTIONS: No  FALLS:  Has patient fallen in last 6 months? Yes. Number of falls 1 6 months ago, she was moving/carrying something and she caught her toe and fell to her knees.  LIVING ENVIRONMENT: Lives with: lives with their spouse Lives in: House/apartment Stairs: Yes: External: 1 steps; none Has following equipment at home: None  OCCUPATION: retired  PLOF: Independent  PATIENT GOALS: not have pain  NEXT MD VISIT: 6 weeks  OBJECTIVE:  Note: Objective measures were completed at Evaluation unless otherwise noted.  DIAGNOSTIC FINDINGS: none  PATIENT SURVEYS:  The Patient-Specific Functional Scale  Initial:  I am going to ask you to identify up to 3 important activities that you are unable to do or are having difficulty with as a result of this problem.  Today are there any activities that you are unable to do or having difficulty with because of this?  (Patient shown scale and patient rated each activity)  Follow up: When you first came in you had difficulty performing these activities.  Today do you still have difficulty?  Patient-Specific activity scoring scheme (Point to one number):  0 1 2 3 4 5 6 7 8 9  10 Unable                                                                                                          Able to perform To perform  activity at  the same Activity         Level as before                                                                                                                       Injury or problem  Activity        Walking                                                                  Initial:         8              follow up:  2.                                                                            Initial:                       follow up:  3.                                                                                  Initial:                       follow up:     COGNITION: Overall cognitive status: Within functional limits for tasks assessed     SENSATION: WFL  MUSCLE LENGTH: mild tightness B HS and piriformis   POSTURE: No Significant postural limitations  PALPATION: Bil glute min, med and greater trochanters  LOWER EXTREMITY ROM: B LE WFL for tasks assessed   LOWER EXTREMITY MMT: hips 4+ to 5 B   FUNCTIONAL TESTS:  5 times sit to stand: 14.77 sec  TREATMENT DATE:   06/19/23 Recumbent bike PT present to discuss status and plan for session Supine PPT x 20 PPT with 90/90 heel taps x 20 PPT with dying bug x 20 PPT with bridge and march while in bridge 2 x 10 PPT with clamshell using red tband x 20 Bridge with feet on red swiss ball 2 x 10 Hamstring curl 2 x 10 using red swiss ball  Trigger Point Dry Needling Subsequent Treatment: Instructions reviewed, if requested by the patient, prior to subsequent dry needling treatment.  Patient Verbal Consent Given: Yes Education Handout Provided: Previously Provided Muscles Treated: bil lumbar multifidi, glut max, med and min, piriformis all on left Electrical Stimulation Performed: No Treatment Response/Outcome: signif twitch, ache and release of tension - skilled palpation used pre and post DN to find TP and  assess outcome  06/12/23 NuStep L4 x 6' PT present to discuss status and plan for session Seated ball rollouts (3 directions)  for lumbar flexion stretch x10 each direction Lateral band walks x 3 laps of 10 steps  Hook lying clamshell x 20 Side lying clam 2 x 10 each side Hip matrix (hip abduction and extension) 25 lb 2 x 10 each LE Trigger Point Dry Needling Subsequent Treatment: Instructions reviewed, if requested by the patient, prior to subsequent dry needling treatment.  Patient Verbal Consent Given: Yes Education Handout Provided: Previously Provided Muscles Treated: bil lumbar multifidi, glut max, med and min, piriformis Electrical Stimulation Performed: No Treatment Response/Outcome: signif twitch, ache and release of tension - skilled palpation used pre and post DN to find TP and assess outcome   06/07/23 NuStep L4 x 6' PT present to discuss status and plan for session Seated ball rollouts for lumbar flexion stretch x8 Seated piriformis stretch edge of table (seated pigeon) x30" each side Hip matrix march with VC for indraw of abdominals 25lb x10 each LE Hip matrix abd 25lb x8 each LE Seated core with yellow weighted ball x6 reps each: L press, hip to opp shoulder, sit to stand with V press Trigger Point Dry Needling  Subsequent Treatment: Instructions reviewed, if requested by the patient, prior to subsequent dry needling treatment.   Patient Verbal Consent Given: Yes Education Handout Provided: Previously Provided Muscles Treated: bil lumbar multifidi, glut max, med and min, piriformis Electrical Stimulation Performed: No Treatment Response/Outcome: signif twitch, ache and release of tension - skilled palpation used pre and post DN to find TP and assess outcome Prone manual traction top-down hands at pelvis and sacrum pressing away from L5   PATIENT EDUCATION:  Education details: PT eval findings, anticipated POC, initial HEP, and DN rational, procedure, outcomes,  potential side effects, and recommended post-treatment exercises/activity  Person educated: Patient Education method: Explanation, Demonstration, and Handouts Education comprehension: verbalized understanding and returned demonstration  HOME EXERCISE PROGRAM: Access Code: [email or web address] URL: [email or web address] Date: 05/28/2023 Prepared by: Penelope Bowie  Exercises - Seated Piriformis Stretch with Trunk Bend  - 2 x daily - 7 x weekly - 1 sets - 3 reps - 30-60 sec  hold - Standing Isometric Hip Abduction with Mini Squat and Ball on Wall  - 1 x daily - 3 x weekly - 2 sets - 10 reps - Standing Hip Abduction with Resistance at Ankles and Counter Support  - 1 x daily - 3 x weekly - 2 sets - 10 reps - Standing Hip Extension with Resistance at Ankles and Counter Support  - 1 x daily - 3 x weekly - 2 sets -  10 reps - Side Stepping with Resistance at Thighs  - 1 x daily - 7 x weekly - 4 sets - 10 reps - Sit to Stand Without Arm Support  - 1 x daily - 7 x weekly - 2 sets - 10 reps  ASSESSMENT:  CLINICAL IMPRESSION: Christina Hayes was having low back pain today on the left.  We focused on some higher level core strength as she tends to avoid these at home.  Educated her on need to do the harder ones so that she creates more stability and helps with spacing throughout the lumbar spine.  She tolerated all new exercises with no increased pain and states that the pain is gone by end of session.  Some deep ache with DN today around lumbar multifidi but no response in gluteals and piriformis.    She would benefit from continued skilled PT for LE strengthening and hip stability.     OBJECTIVE IMPAIRMENTS: decreased activity tolerance, decreased strength, increased muscle spasms, impaired flexibility, and pain.   ACTIVITY LIMITATIONS: locomotion level  PARTICIPATION LIMITATIONS: not limited but painful  PERSONAL FACTORS: Time since onset of injury/illness/exacerbation and 1 comorbidity: vertigo are also  affecting patient's functional outcome.   REHAB POTENTIAL: Excellent  CLINICAL DECISION MAKING: Stable/uncomplicated  EVALUATION COMPLEXITY: Low   GOALS: Goals reviewed with patient? Yes  SHORT TERM GOALS: Target date: 06/12/2023   Patient will be independent with initial HEP. Baseline:  Goal status: MET 05/31/23  2.  Decreased hip pain with walking by 50% Baseline:  Goal status: MET 05/31/23    LONG TERM GOALS: Target date: 07/03/2023   Patient will be independent with advanced/ongoing HEP to improve outcomes and carryover.  Baseline:  Goal status: INITIAL  2.  Patient able to walk her normal walking program without pain.  Baseline:  Goal status: INITIAL  3.  Patient will demonstrate improved LE strength to 5/5. Baseline:  Goal status: INITIAL      PLAN:  PT FREQUENCY: 2x/week  PT DURATION: 6 weeks  PLANNED INTERVENTIONS: [id number]- PT Re-evaluation, 97110-Therapeutic exercises, 97530- Therapeutic activity, [id number]- Neuromuscular re-education, 97535- Self Care, 63875- Manual therapy, [id number]- Gait training, [phone]- Electrical stimulation (unattended), [phone]- Electrical stimulation (manual), [phone]- Ionotophoresis 4mg /ml Dexamethasone , Patient/Family education, Balance training, Taping, Dry Needling, Joint mobilization, Spinal mobilization, Cryotherapy, and Moist heat  PLAN FOR NEXT SESSION: target core, continue hip strength and stability; manual/ DN as indicated (Needs aggressive hip abductor strengthening  per Dr. Elva Hamburger)   [facility] B. Jearline Hirschhorn, PT 06/19/23 2:49 PM [facility] [address] [place], [place] [address] Phone # [phone] Fax [phone]

## 2023-06-21 ENCOUNTER — Ambulatory Visit (INDEPENDENT_AMBULATORY_CARE_PROVIDER_SITE_OTHER): Payer: Self-pay

## 2023-06-21 DIAGNOSIS — J309 Allergic rhinitis, unspecified: Secondary | ICD-10-CM | POA: Diagnosis not present

## 2023-06-25 ENCOUNTER — Ambulatory Visit: Attending: Sports Medicine

## 2023-06-25 DIAGNOSIS — M25551 Pain in right hip: Secondary | ICD-10-CM | POA: Diagnosis present

## 2023-06-25 DIAGNOSIS — M6281 Muscle weakness (generalized): Secondary | ICD-10-CM | POA: Diagnosis present

## 2023-06-25 DIAGNOSIS — R262 Difficulty in walking, not elsewhere classified: Secondary | ICD-10-CM | POA: Diagnosis present

## 2023-06-25 DIAGNOSIS — M25552 Pain in left hip: Secondary | ICD-10-CM | POA: Insufficient documentation

## 2023-06-25 DIAGNOSIS — R252 Cramp and spasm: Secondary | ICD-10-CM | POA: Insufficient documentation

## 2023-06-25 DIAGNOSIS — R293 Abnormal posture: Secondary | ICD-10-CM | POA: Diagnosis present

## 2023-06-25 NOTE — Therapy (Signed)
 OUTPATIENT PHYSICAL THERAPY LOWER EXTREMITY TREATMENT PHYSICAL THERAPY DISCHARGE SUMMARY  Visits from Start of Care: 8  Current functional level related to goals / functional outcomes: See below   Remaining deficits: See below   Education / Equipment: See below   Patient agrees to discharge. Patient goals were met. Patient is being discharged due to meeting the stated rehab goals.    Patient Name: Christina Hayes MRN: [id number] DOB:08-Jun-1950, 73 y.o., female Today's Date: 06/25/2023  END OF SESSION:  PT End of Session - 06/25/23 1536     Visit Number 8    Number of Visits 8    Date for PT Re-Evaluation 07/03/23    Authorization Type [employer] APPROVED [id number] VISITS 05/22/2023 - 07/03/2023    Authorization Time Period 05/22/2023 - 07/03/2023    Authorization - Visit Number 8    Authorization - Number of Visits 8    Progress Note Due on Visit 10    PT Start Time 1530    PT Stop Time 1615    PT Time Calculation (min) 45 min    Activity Tolerance Patient tolerated treatment well    Behavior During Therapy WFL for tasks assessed/performed                Past Medical History:  Diagnosis Date   Acid reflux    Allergic rhinitis, cause unspecified    Allergy    Anemia    years ago    Cataract    Complication of anesthesia SLOW TO WAKE   Cough    DJD (degenerative joint disease)    spine   Frequency of urination    H/O hiatal hernia    small   Head cold    Headache(784.0)    migraqine- uses essential oils   HYPERLIPIDEMIA, MIXED 12/03/2006   PT STATES NOT TAKING MED AS PRESCRIBED   MIGRAINES, HX OF 12/03/2006   Neuromuscular disorder (HCC)    Nocturia    OSA on CPAP    Osteopenia    PONV (postoperative nausea and vomiting)    in past   Shortness of breath    Sleep apnea    cpap   SUI (stress urinary incontinence, female)    Urgency of urination    VERTIGO, HX OF 12/03/2006   Past Surgical History:  Procedure Laterality Date   COLONOSCOPY      PUBOVAGINAL SLING  04/30/2011   Procedure: Gino Lais;  Surgeon: Livingston Rigg, MD;  Location: [facility];  Service: Urology;  Laterality: N/A;  sub urethral sling    possible ower   RIGID BRONCHOSCOPY N/A 04/23/2013   Procedure: LASER BRONCHOSCOPY;  Surgeon: Zelphia Higashi, MD;  Location: [facility] OR;  Service: Thoracic;  Laterality: N/A;   SALPINGOOPHORECTOMY  1995   BILATERAL   VAGINAL HYSTERECTOMY  1988   VIDEO BRONCHOSCOPY Bilateral 03/26/2013   Procedure: VIDEO BRONCHOSCOPY WITHOUT FLUORO;  Surgeon: Diamond Formica, MD;  Location: [facility];  Service: Cardiopulmonary;  Laterality: Bilateral;   VIDEO BRONCHOSCOPY N/A 04/23/2013   Procedure: VIDEO BRONCHOSCOPY;  Surgeon: Zelphia Higashi, MD;  Location: [facility] OR;  Service: Thoracic;  Laterality: N/A;   VIDEO BRONCHOSCOPY N/A 05/28/2014   Procedure: VIDEO BRONCHOSCOPY;  Surgeon: Zelphia Higashi, MD;  Location: [facility] OR;  Service: Thoracic;  Laterality: N/A;   Patient Active Problem List   Diagnosis Date Noted   Greater trochanteric bursitis 05/08/2023   Prediabetes 10/23/2021   Osteoporosis 04/18/2020   Chronic rhinitis 12/28/2019  Osteoarthritis 10/18/2019   Weight gain 04/13/2019   GERD (gastroesophageal reflux disease) 04/13/2019   Insomnia 04/13/2019   DJD (degenerative joint disease) of knee 09/06/2015   Degenerative tear of medial meniscus 09/06/2015   Severe sleep apnea 07/21/2015   Osteopenia determined by x-ray 07/20/2015   Lumbago 06/15/2015   Left knee pain 06/10/2015   Carcinoid tumor 05/18/2014   Sleep pattern disturbance 01/26/2014   Obesity, unspecified 03/25/2012   HLD (hyperlipidemia) 12/03/2006   VERTIGO, HX OF 12/03/2006   Migraine 12/03/2006    PCP: Adela Holter, DO   REFERRING PROVIDER: Gean Keels,*   REFERRING DIAG: M70.60 (ICD-10-CM) - Greater trochanteric bursitis, unspecified laterality   THERAPY DIAG:  Cramp and spasm  Difficulty in walking,  not elsewhere classified  Muscle weakness (generalized)  Abnormal posture  Pain of both hip joints  Rationale for Evaluation and Treatment: Rehabilitation  ONSET DATE: months ago  SUBJECTIVE:   SUBJECTIVE STATEMENT: Pt reports she is doing ok.  Discussed last approved visit and DC vs continuing PT.  Patient felt she had a good handle on the exercises and could continue on her own.    PERTINENT HISTORY: vertigo (please try to avoid any floor exercises) PAIN:  06/25/23 Are you having pain? Yes: NPRS scale:  0/10 hips, LBP 5/10 Pain location: B hips Pain description: sharp Aggravating factors: walking, sometimes lying down, a little bit with stairs Relieving factors: resting, meds   PRECAUTIONS: None  RED FLAGS: None   WEIGHT BEARING RESTRICTIONS: No  FALLS:  Has patient fallen in last 6 months? Yes. Number of falls 1 6 months ago, she was moving/carrying something and she caught her toe and fell to her knees.  LIVING ENVIRONMENT: Lives with: lives with their spouse Lives in: House/apartment Stairs: Yes: External: 1 steps; none Has following equipment at home: None  OCCUPATION: retired  PLOF: Independent  PATIENT GOALS: not have pain  NEXT MD VISIT: 6 weeks  OBJECTIVE:  Note: Objective measures were completed at Evaluation unless otherwise noted.  DIAGNOSTIC FINDINGS: none  PATIENT SURVEYS:  The Patient-Specific Functional Scale  Initial:  I am going to ask you to identify up to 3 important activities that you are unable to do or are having difficulty with as a result of this problem.  Today are there any activities that you are unable to do or having difficulty with because of this?  (Patient shown scale and patient rated each activity)  Follow up: When you first came in you had difficulty performing these activities.  Today do you still have difficulty?  Patient-Specific activity scoring scheme (Point to one number):  0 1 2 3 4 5 6 7 8 9  10 Unable                                                                                                           Able to perform To perform  activity at the same Activity         Level as before                                                                                                                       Injury or problem  Activity        Walking                                                      Initial:         8           follow up: 9  2.                                                                            Initial:                       follow up:  3.                                                                            Initial:                       follow up:     COGNITION: Overall cognitive status: Within functional limits for tasks assessed     SENSATION: WFL  MUSCLE LENGTH: mild tightness B HS and piriformis   POSTURE: No Significant postural limitations  PALPATION: Bil glute min, med and greater trochanters  LOWER EXTREMITY ROM: B LE WFL for tasks assessed   LOWER EXTREMITY MMT: hips 4+ to 5 B   FUNCTIONAL TESTS:  06/25/23 5 times sit to stand: 11.71 sec TUG: 7.12 sec  Initial eval: 5 times sit to stand: 14.77 sec  TREATMENT DATE:   06/25/23 DC assessment completed Reviewed entire HEP,  updated and new handouts provided DC plan explained  06/19/23 Recumbent bike PT present to discuss status and plan for session Supine PPT x 20 PPT with 90/90 heel taps x 20 PPT with dying bug x 20 PPT with bridge and march while in bridge 2 x 10 PPT with clamshell using red tband x 20 Bridge with feet on red swiss ball 2 x 10 Hamstring curl 2 x 10 using red swiss ball  Trigger Point Dry Needling Subsequent Treatment: Instructions reviewed, if  requested by the patient, prior to subsequent dry needling treatment.  Patient Verbal Consent Given: Yes Education Handout Provided: Previously Provided Muscles Treated: bil lumbar multifidi, glut max, med and min, piriformis all on left Electrical Stimulation Performed: No Treatment Response/Outcome: signif twitch, ache and release of tension - skilled palpation used pre and post DN to find TP and assess outcome  06/12/23 NuStep L4 x 6' PT present to discuss status and plan for session Seated ball rollouts (3 directions)  for lumbar flexion stretch x10 each direction Lateral band walks x 3 laps of 10 steps  Hook lying clamshell x 20 Side lying clam 2 x 10 each side Hip matrix (hip abduction and extension) 25 lb 2 x 10 each LE Trigger Point Dry Needling Subsequent Treatment: Instructions reviewed, if requested by the patient, prior to subsequent dry needling treatment.  Patient Verbal Consent Given: Yes Education Handout Provided: Previously Provided Muscles Treated: bil lumbar multifidi, glut max, med and min, piriformis Electrical Stimulation Performed: No Treatment Response/Outcome: signif twitch, ache and release of tension - skilled palpation used pre and post DN to find TP and assess outcome   PATIENT EDUCATION:  Education details: PT eval findings, anticipated POC, initial HEP, and DN rational, procedure, outcomes, potential side effects, and recommended post-treatment exercises/activity  Person educated: Patient Education method: Explanation, Demonstration, and Handouts Education comprehension: verbalized understanding and returned demonstration  HOME EXERCISE PROGRAM: Access Code: [email or web address] URL: [email or web address] Date: 06/25/2023 Prepared by: Aletha Anderson  Exercises - Standing Hamstring Stretch on Chair  - 1 x daily - 7 x weekly - 1 sets - 3 reps - 30 sec hold - Quadricep Stretch with Chair and Counter Support  - 1 x daily - 7 x weekly - 1 sets - 3  reps - 30 sec hold - Seated Piriformis Stretch with Trunk Bend  - 2 x daily - 7 x weekly - 1 sets - 3 reps - 30-60 sec  hold - Supine Posterior Pelvic Tilt  - 1 x daily - 7 x weekly - 3 sets - 10 reps - Supine 90/90 Alternating Heel Touches with Posterior Pelvic Tilt  - 1 x daily - 7 x weekly - 2 sets - 10 reps - Supine Dead Bug with Leg Extension  - 1 x daily - 7 x weekly - 2 sets - 10 reps - Hooklying Isometric Clamshell  - 1 x daily - 7 x weekly - 3 sets - 10 reps - Clamshell with Resistance  - 1 x daily - 7 x weekly - 3 sets - 10 reps - Side Stepping with Resistance at Thighs  - 1 x daily - 7 x weekly - 4 sets - 10 reps - Sit to Stand Without Arm Support  - 1 x daily - 7 x weekly - 2 sets - 10 reps ASSESSMENT:  CLINICAL IMPRESSION: Anselmo Kings has reached her last approved visit.  We discussed DC vs continuing PT.  She felt she had a good handle on the exercises and could continue on her own.  However, upon review of the current HEP, she needed heavy reminders and review on technique.  We discussed her ability to do her HEP and she admits she is so consumed with caring for her elderly mom that she doesn't have a lot of time to devote to the exercises so she just does a few here and there.  She feels like she is doing well enough to work on the exercises her own and maybe return if she has a set back.  Her objective findings are improved and she is beginning to walk longer distances.  She should continue to improve if she is compliant with her HEP and walking schedule.  We will DC at this time per patient request.    OBJECTIVE IMPAIRMENTS: decreased activity tolerance, decreased strength, increased muscle spasms, impaired flexibility, and pain.   ACTIVITY LIMITATIONS: locomotion level  PARTICIPATION LIMITATIONS: not limited but painful  PERSONAL FACTORS: Time since onset of injury/illness/exacerbation and 1 comorbidity: vertigo are also affecting patient's functional outcome.   REHAB POTENTIAL:  Excellent  CLINICAL DECISION MAKING: Stable/uncomplicated  EVALUATION COMPLEXITY: Low   GOALS: Goals reviewed with patient? Yes  SHORT TERM GOALS: Target date: 06/12/2023   Patient will be independent with initial HEP. Baseline:  Goal status: MET 05/31/23  2.  Decreased hip pain with walking by 50% Baseline:  Goal status: MET 05/31/23    LONG TERM GOALS: Target date: 07/03/2023   Patient will be independent with advanced/ongoing HEP to improve outcomes and carryover.  Baseline:  Goal status: NOT MET  2.  Patient able to walk her normal walking program without pain.  Baseline:  Goal status: Partially met  3.  Patient will demonstrate improved LE strength to 5/5. Baseline:  Goal status: Partially met     PLAN:  PT FREQUENCY: 2x/week  PT DURATION: 6 weeks  PLANNED INTERVENTIONS: 97164- PT Re-evaluation, 97110-Therapeutic exercises, 97530- Therapeutic activity, [id number]- Neuromuscular re-education, 97535- Self Care, 16109- Manual therapy, [id number]- Gait training, [phone]- Electrical stimulation (unattended), [phone]- Electrical stimulation (manual), [phone]- Ionotophoresis 4mg /ml Dexamethasone , Patient/Family education, Balance training, Taping, Dry Needling, Joint mobilization, Spinal mobilization, Cryotherapy, and Moist heat  PLAN FOR NEXT SESSION: DC   Mason Burleigh B. Niklaus Mamaril, PT 06/25/23 5:02 PM [facility] [address], [address] [place], [place] [address] Phone # [phone] Fax [phone]

## 2023-06-27 ENCOUNTER — Ambulatory Visit

## 2023-06-27 VITALS — Ht 61.5 in | Wt 164.0 lb

## 2023-06-27 DIAGNOSIS — Z0000 Encounter for general adult medical examination without abnormal findings: Secondary | ICD-10-CM

## 2023-06-27 DIAGNOSIS — Z1211 Encounter for screening for malignant neoplasm of colon: Secondary | ICD-10-CM

## 2023-06-27 NOTE — Progress Notes (Signed)
 Subjective:   Christina Hayes is a 73 y.o. female who presents for Medicare Annual (Subsequent) preventive examination.  Visit Complete: Virtual I connected with  Christina Hayes on 06/27/23 by a audio enabled telemedicine application and verified that I am speaking with the correct person using two identifiers.  Patient Location: Home  Provider Location: Office/Clinic  I discussed the limitations of evaluation and management by telemedicine. The patient expressed understanding and agreed to proceed.  Vital Signs: Because this visit was a virtual/telehealth visit, some criteria may be missing or patient reported. Any vitals not documented were not able to be obtained and vitals that have been documented are patient reported.  Patient Medicare AWV questionnaire was completed by the patient on n/a; I have confirmed that all information answered by patient is correct and no changes since this date.  Cardiac Risk Factors include: advanced age (>65men, >43 women);obesity (BMI >30kg/m2);dyslipidemia;sedentary lifestyle     Objective:    Today's Vitals   06/27/23 1056  Weight: 164 lb (74.4 kg)  Height: 5' 1.5" (1.562 m)   Body mass index is 30.49 kg/m.     06/27/2023   11:18 AM 05/22/2023    2:11 PM 06/13/2022    9:19 AM 06/07/2021   11:20 AM 04/26/2020    3:16 PM 03/21/2020    2:29 PM 12/30/2018   11:10 AM  Advanced Directives  Does Patient Have a Medical Advance Directive? Yes Yes No Yes Yes Yes Yes  Type of Estate agent of [place];Living will Healthcare Power of [place];Living will  Living will Healthcare Power of [place];Living will Healthcare Power of [place];Living will Healthcare Power of [place];Living will  Does patient want to make changes to medical advance directive? No - Patient declined No - Patient declined  No - Patient declined  No - Patient declined No - Patient declined  Copy of Healthcare Power of Attorney in Chart? No - copy requested No -  copy requested    No - copy requested No - copy requested  Would patient like information on creating a medical advance directive?   No - Patient declined        Current Medications (verified) Outpatient Encounter Medications as of 06/27/2023  Medication Sig   alendronate  (FOSAMAX ) 70 MG tablet Take 1 tablet (70 mg total) by mouth every 7 (seven) days. Take with a full glass of water  on an empty stomach.   AMBULATORY NON FORMULARY MEDICATION Please provide cpap supplies: heated tubing, humidifier chamber, disposable filters, Mask/Mask frame/Head gear per patient preference   diclofenac  (VOLTAREN ) 75 MG EC tablet Take 1 tablet (75 mg total) by mouth 2 (two) times daily.   EPINEPHrine  0.3 mg/0.3 mL IJ SOAJ injection Inject 0.3 mg into the muscle as needed for anaphylaxis.   furosemide  (LASIX ) 20 MG tablet Take 1 tablet (20 mg total) by mouth daily. (Patient taking differently: Take 20 mg by mouth daily as needed.)   ipratropium (ATROVENT ) 0.03 % nasal spray USE 2 SPRAYS IN BOTH NOSTRILS  DAILY   levocetirizine (XYZAL ) 5 MG tablet TAKE 1 TABLET BY MOUTH IN THE  EVENING   montelukast  (SINGULAIR ) 10 MG tablet Take 1 tablet (10 mg total) by mouth at bedtime. (Patient taking differently: Take 10 mg by mouth daily as needed.)   Multiple Vitamin (MULTIVITAMIN) tablet Take 1 tablet by mouth daily.   OZEMPIC , 1 MG/DOSE, 4 MG/3ML SOPN INJECT 1 MG UNDER THE SKIN ONCE A WEEK   pantoprazole  (PROTONIX ) 40 MG tablet Take 1  tablet (40 mg total) by mouth daily.   rosuvastatin  (CRESTOR ) 20 MG tablet Take 1 tablet (20 mg total) by mouth daily.   SUMAtriptan  (IMITREX ) 50 MG tablet TAKE 1 TABLET BY MOUTH EVERY 2  HOURS AS NEEDED FOR MIGRAINE.  MAY REPEAT IN 2 HOURS IF  HEADACHE PERSISTS OR RECURS   tiZANidine  (ZANAFLEX ) 4 MG tablet Take 4 mg by mouth every 6 (six) hours as needed for muscle spasms.   triamcinolone  cream (KENALOG ) 0.1 % Apply 1 Application topically 2 (two) times daily.   zolpidem  (AMBIEN ) 5 MG tablet  Take 1 tablet (5 mg total) by mouth at bedtime as needed for sleep.   No facility-administered encounter medications on file as of 06/27/2023.    Allergies (verified) Trazodone  and nefazodone, Codeine, Protriptyline, Tramadol, and Vivactil [protriptyline hcl]   History: Past Medical History:  Diagnosis Date   Acid reflux    Allergic rhinitis, cause unspecified    Allergy    Anemia    years ago    Cataract    Complication of anesthesia SLOW TO WAKE   Cough    DJD (degenerative joint disease)    spine   Frequency of urination    H/O hiatal hernia    small   Head cold    Headache(784.0)    migraqine- uses essential oils   HYPERLIPIDEMIA, MIXED 12/03/2006   PT STATES NOT TAKING MED AS PRESCRIBED   MIGRAINES, HX OF 12/03/2006   Neuromuscular disorder (HCC)    Nocturia    OSA on CPAP    Osteopenia    PONV (postoperative nausea and vomiting)    in past   Shortness of breath    Sleep apnea    cpap   SUI (stress urinary incontinence, female)    Urgency of urination    VERTIGO, HX OF 12/03/2006   Past Surgical History:  Procedure Laterality Date   COLONOSCOPY     PUBOVAGINAL SLING  04/30/2011   Procedure: Gino Lais;  Surgeon: Livingston Rigg, MD;  Location: [facility];  Service: Urology;  Laterality: N/A;  sub urethral sling    possible ower   RIGID BRONCHOSCOPY N/A 04/23/2013   Procedure: LASER BRONCHOSCOPY;  Surgeon: Zelphia Higashi, MD;  Location: [facility] OR;  Service: Thoracic;  Laterality: N/A;   SALPINGOOPHORECTOMY  1995   BILATERAL   VAGINAL HYSTERECTOMY  1988   VIDEO BRONCHOSCOPY Bilateral 03/26/2013   Procedure: VIDEO BRONCHOSCOPY WITHOUT FLUORO;  Surgeon: Diamond Formica, MD;  Location: [facility];  Service: Cardiopulmonary;  Laterality: Bilateral;   VIDEO BRONCHOSCOPY N/A 04/23/2013   Procedure: VIDEO BRONCHOSCOPY;  Surgeon: Zelphia Higashi, MD;  Location: [facility] OR;  Service: Thoracic;  Laterality: N/A;   VIDEO BRONCHOSCOPY N/A  05/28/2014   Procedure: VIDEO BRONCHOSCOPY;  Surgeon: Zelphia Higashi, MD;  Location: [facility] OR;  Service: Thoracic;  Laterality: N/A;   Family History  Problem Relation Age of Onset   Cancer Mother        Colon Cancer survivor   Diabetes Mother    Colon cancer Mother    Diabetes Father    Hyperlipidemia Father    Alzheimer's disease Father    Cancer Maternal Grandmother        Breast Cancer   Breast cancer Maternal Grandmother    Cancer Daughter 55       stage III-B breast CA   Breast cancer Daughter    Colon polyps Brother    Allergies Daughter    Esophageal cancer  Neg Hx    Rectal cancer Neg Hx    Stomach cancer Neg Hx    Social History   Socioeconomic History   Marital status: Married    Spouse name: Dean Every   Number of children: 1   Years of education: 12   Highest education level: 12th grade  Occupational History   Occupation: Retired    Comment: Psychologist, prison and probation services  Tobacco Use   Smoking status: Never   Smokeless tobacco: Never  Vaping Use   Vaping status: Never Used  Substance and Sexual Activity   Alcohol use: No   Drug use: No   Sexual activity: Not Currently    Partners: Male    Birth control/protection: Surgical  Other Topics Concern   Not on file  Social History Narrative   Lives with her husband. She has one child. She enjoys reading.   Social Drivers of Corporate investment banker Strain: Low Risk  (06/27/2023)   Overall Financial Resource Strain (CARDIA)    Difficulty of Paying Living Expenses: Not hard at all  Food Insecurity: No Food Insecurity (06/27/2023)   Hunger Vital Sign    Worried About Running Out of Food in the Last Year: Never true    Ran Out of Food in the Last Year: Never true  Transportation Needs: No Transportation Needs (06/27/2023)   PRAPARE - Administrator, Civil Service (Medical): No    Lack of Transportation (Non-Medical): No  Physical Activity: Insufficiently Active (06/27/2023)   Exercise Vital Sign    Days of  Exercise per Week: 2 days    Minutes of Exercise per Session: 60 min  Stress: No Stress Concern Present (06/27/2023)   [employer] of Occupational Health - Occupational Stress Questionnaire    Feeling of Stress : Not at all  Social Connections: Moderately Isolated (06/27/2023)   Social Connection and Isolation Panel [NHANES]    Frequency of Communication with Friends and Family: More than three times a week    Frequency of Social Gatherings with Friends and Family: Once a week    Attends Religious Services: Never    Database administrator or Organizations: No    Attends Engineer, structural: Never    Marital Status: Married    Tobacco Counseling Counseling given: Not Answered   Clinical Intake:  Pre-visit preparation completed: Yes  Pain : No/denies pain     BMI - recorded: 30.49 Nutritional Status: BMI > 30  Obese Nutritional Risks: None Diabetes: No  How often do you need to have someone help you when you read instructions, pamphlets, or other written materials from your doctor or pharmacy?: 1 - Never What is the last grade level you completed in school?: 12  Interpreter Needed?: No      Activities of Daily Living    06/27/2023   10:58 AM  In your present state of health, do you have any difficulty performing the following activities:  Hearing? 1  Comment a little trouble hearing.  Vision? 0  Difficulty concentrating or making decisions? 1  Walking or climbing stairs? 0  Dressing or bathing? 0  Doing errands, shopping? 0  Preparing Food and eating ? N  Using the Toilet? N  In the past six months, have you accidently leaked urine? N  Do you have problems with loss of bowel control? N  Managing your Medications? N  Managing your Finances? N  Housekeeping or managing your Housekeeping? N    Patient Care Team: Adela Holter,  DO as PCP - General (Family Medicine) Brian Campanile, MD as Consulting Physician (Allergy) Zelphia Higashi, MD as Consulting Physician (Cardiothoracic Surgery)  Indicate any recent Medical Services you may have received from other than [facility] providers in the past year (date may be approximate).     Assessment:   This is a routine wellness examination for Christina Hayes.  Hearing/Vision screen No results found.   Goals Addressed             This Visit's Progress    Patient Stated       Patient states she would like to continue to improve back pain.       Depression Screen    06/27/2023   11:16 AM 06/13/2022    9:19 AM 06/07/2021   11:17 AM 04/20/2021    2:16 PM 03/21/2020    2:30 PM 12/30/2018   11:16 AM 12/24/2017   11:08 AM  PHQ 2/9 Scores  PHQ - 2 Score 0 0 0 1 0 0 1  PHQ- 9 Score       6    Fall Risk    06/27/2023   11:19 AM 06/13/2022    9:19 AM 06/07/2021   11:17 AM 06/06/2021    4:25 PM 04/20/2021    2:16 PM  Fall Risk   Falls in the past year? 1 0 0 0 0  Number falls in past yr: 0 0 0 0 0  Injury with Fall? 1 0 0 0 0  Risk for fall due to : History of fall(s) No Fall Risks No Fall Risks  No Fall Risks  Follow up Falls evaluation completed Falls evaluation completed Falls evaluation completed  Falls evaluation completed    MEDICARE RISK AT HOME: Medicare Risk at Home Any stairs in or around the home?: No Home free of loose throw rugs in walkways, pet beds, electrical cords, etc?: No Adequate lighting in your home to reduce risk of falls?: Yes Life alert?: No Use of a cane, walker or w/c?: No Grab bars in the bathroom?: No Shower chair or bench in shower?: No Elevated toilet seat or a handicapped toilet?: No  TIMED UP AND GO:  Was the test performed?  No    Cognitive Function:        06/27/2023   11:24 AM 06/13/2022    9:26 AM 06/07/2021   11:21 AM 03/21/2020    2:36 PM 12/30/2018   11:14 AM  6CIT Screen  What Year? 0 points 0 points 0 points 0 points 0 points  What month? 0 points 0 points 0 points 0 points 0 points  What time? 0 points 0 points 0 points 0  points 0 points  Count back from 20 0 points 0 points 0 points 0 points 0 points  Months in reverse 0 points 0 points 0 points 0 points 0 points  Repeat phrase 0 points 0 points 0 points 0 points 0 points  Total Score 0 points 0 points 0 points 0 points 0 points    Immunizations Immunization History  Administered Date(s) Administered   Fluad Quad(high Dose 65+) 10/14/2019, 10/13/2020, 10/23/2021   Fluad Trivalent(High Dose 65+) 09/14/2022   Influenza, High Dose Seasonal PF 10/23/2016, 09/30/2018   Influenza,inj,Quad PF,6+ Mos 11/10/2015, 10/14/2017   Influenza-Unspecified 10/23/2016   PFIZER(Purple Top)SARS-COV-2 Vaccination 04/01/2019, 04/22/2019, 12/04/2019, 06/10/2020   Pfizer Covid-19 Vaccine Bivalent Booster 61yrs & up 10/31/2020   Pfizer(Comirnaty)Fall Seasonal Vaccine 12 years and older 01/30/2022   Pneumococcal Conjugate-13 07/13/2015  Pneumococcal Polysaccharide-23 03/25/2012, 05/16/2017   RSV,unspecified 12/19/2021   Td 05/20/2003   Tdap 01/26/2014   Zoster Recombinant(Shingrix) 01/30/2022, 04/03/2022   Zoster, Live 04/25/2010    TDAP status: Up to date  Flu Vaccine status: Up to date  Pneumococcal vaccine status: Up to date  Covid-19 vaccine status: Information provided on how to obtain vaccines.   Qualifies for Shingles Vaccine? Yes   Zostavax completed Yes   Shingrix Completed?: Yes  Screening Tests Health Maintenance  Topic Date Due   COVID-19 Vaccine (7 - 2024-25 season) 09/23/2022   Colonoscopy  03/06/2023   INFLUENZA VACCINE  08/23/2023   DTaP/Tdap/Td (3 - Td or Tdap) 01/27/2024   Medicare Annual Wellness (AWV)  06/26/2024   MAMMOGRAM  06/27/2024   Pneumonia Vaccine 12+ Years old  Completed   DEXA SCAN  Completed   Hepatitis C Screening  Completed   Zoster Vaccines- Shingrix  Completed   HPV VACCINES  Aged Out   Meningococcal B Vaccine  Aged Out    Health Maintenance  Health Maintenance Due  Topic Date Due   COVID-19 Vaccine (7 - 2024-25  season) 09/23/2022   Colonoscopy  03/06/2023    Colorectal cancer screening: Referral to GI placed 06/27/2023. Pt aware the office will call re: appt.  Mammogram status: Completed 06/28/2022. Repeat every year  Bone Density status: Completed 07/11/2022. Results reflect: Bone density results: OSTEOPOROSIS. Repeat every 2 years.  Lung Cancer Screening: (Low Dose CT Chest recommended if Age 2-80 years, 20 pack-year currently smoking OR have quit w/in 15years.) does not qualify.   Lung Cancer Screening Referral: n/a  Additional Screening:  Hepatitis C Screening: does qualify; Completed 07/13/2015  Vision Screening: Recommended annual ophthalmology exams for early detection of glaucoma and other disorders of the eye. Is the patient up to date with their annual eye exam?  Yes  Who is the provider or what is the name of the office in which the patient attends annual eye exams? Dr Alto Atta If pt is not established with a provider, would they like to be referred to a provider to establish care? N/a.   Dental Screening: Recommended annual dental exams for proper oral hygiene   Community Resource Referral / Chronic Care Management: CRR required this visit?  No   CCM required this visit?  No     Plan:     I have personally reviewed and noted the following in the patient's chart:   Medical and social history Use of alcohol, tobacco or illicit drugs  Current medications and supplements including opioid prescriptions. Patient is not currently taking opioid prescriptions. Functional ability and status Nutritional status Physical activity Advanced directives List of other physicians Hospitalizations, surgeries, and ER visits in previous 12 months. None Vitals Screenings to include cognitive, depression, and falls Referrals and appointments  In addition, I have reviewed and discussed with patient certain preventive protocols, quality metrics, and best practice recommendations. A written  personalized care plan for preventive services as well as general preventive health recommendations were provided to patient.     Aubrey Leaf, CMA   06/27/2023   After Visit Summary: (MyChart) Due to this being a telephonic visit, the after visit summary with patients personalized plan was offered to patient via MyChart   Nurse Notes:    Christina Hayes is a 73 y.o. female patient of Adela Holter, DO who had a Medicare Annual Wellness Visit today via telephone. Christina Hayes is Retired and lives with their spouse. She has 1 child. she  reports that she is socially active and does interact with friends/family regularly. She is moderately physically active and enjoys reading and art.

## 2023-06-27 NOTE — Patient Instructions (Signed)
 Christina Hayes , Thank you for taking time to come for your Medicare Wellness Visit. I appreciate your ongoing commitment to your health goals. Please review the following plan we discussed and let me know if I can assist you in the future.   These are the goals we discussed:  Goals       Exercise 3x per week (30 min per time)      Exercise like walking at least 3 times a week for 30 minutes at a time to help relieve stress.      Medication Management      Patient Goals/Self-Care Activities Over the next 30 days, patient will:  take medications as prescribed  Follow Up Plan: Telephone follow up appointment with care management team member scheduled for:  1 month      Patient Stated (pt-stated)      03/21/2020 AWV Goal: Exercise for General Health  Patient will verbalize understanding of the benefits of increased physical activity: Exercising regularly is important. It will improve your overall fitness, flexibility, and endurance. Regular exercise also will improve your overall health. It can help you control your weight, reduce stress, and improve your bone density. Over the next year, patient will increase physical activity as tolerated with a goal of at least 150 minutes of moderate physical activity per week.  You can tell that you are exercising at a moderate intensity if your heart starts beating faster and you start breathing faster but can still hold a conversation. Moderate-intensity exercise ideas include: Walking 1 mile (1.6 km) in about 15 minutes Biking Hiking Golfing Dancing Water  aerobics Patient will verbalize understanding of everyday activities that increase physical activity by providing examples like the following: Yard work, such as: Insurance underwriter Gardening Washing windows or floors Patient will be able to explain general safety guidelines for exercising:  Before you start a  new exercise program, talk with your health care provider. Do not exercise so much that you hurt yourself, feel dizzy, or get very short of breath. Wear comfortable clothes and wear shoes with good support. Drink plenty of water  while you exercise to prevent dehydration or heat stroke. Work out until your breathing and your heartbeat get faster.       Patient Stated (pt-stated)      Patient stated that she would like to loose 30 lbs.      Patient Stated (pt-stated)      Patient stated that she would like to be able to do more around the house and be without any backpain.      Patient Stated      Patient states she would like to continue to improve back pain.       Weight (lb) < 200 lb (90.7 kg)      Would like to loose 30 lbs.        This is a list of the screening recommended for you and due dates:  Health Maintenance  Topic Date Due   COVID-19 Vaccine (7 - 2024-25 season) 09/23/2022   Colon Cancer Screening  03/06/2023   Flu Shot  08/23/2023   DTaP/Tdap/Td vaccine (3 - Td or Tdap) 01/27/2024   Medicare Annual Wellness Visit  06/26/2024   Mammogram  06/27/2024   Pneumonia Vaccine  Completed   DEXA scan (bone density measurement)  Completed   Hepatitis C Screening  Completed   Zoster (Shingles) Vaccine  Completed  HPV Vaccine  Aged Out   Meningitis B Vaccine  Aged Out

## 2023-07-01 ENCOUNTER — Ambulatory Visit (INDEPENDENT_AMBULATORY_CARE_PROVIDER_SITE_OTHER): Admitting: Sports Medicine

## 2023-07-01 DIAGNOSIS — M7061 Trochanteric bursitis, right hip: Secondary | ICD-10-CM | POA: Diagnosis not present

## 2023-07-01 NOTE — Assessment & Plan Note (Signed)
 Very pleasant 73 year old female, pain is improved considerably with therapy, strength has improved, approximately 70 to 80% better, still has some discomfort, she will do home therapy for the next 4 to 6 weeks and we can certainly consider an injection if not better at the follow-up.

## 2023-07-01 NOTE — Progress Notes (Signed)
    Procedures performed today:    None.  Independent interpretation of notes and tests performed by another provider:   None.  Brief History, Exam, Impression, and Recommendations:    Greater trochanteric bursitis Very pleasant 73 year old female, pain is improved considerably with therapy, strength has improved, approximately 70 to 80% better, still has some discomfort, she will do home therapy for the next 4 to 6 weeks and we can certainly consider an injection if not better at the follow-up.    ____________________________________________ Joselyn Nicely. Sandy Crumb, M.D., ABFM., CAQSM., AME. Primary Care and Sports Medicine [facility]  Adjunct Professor of [facility] Medicine  [employer]  Restaurant manager, fast food

## 2023-07-02 ENCOUNTER — Ambulatory Visit

## 2023-07-03 ENCOUNTER — Ambulatory Visit (HOSPITAL_COMMUNITY)
Admission: RE | Admit: 2023-07-03 | Discharge: 2023-07-03 | Disposition: A | Discharge status: Home / Self Care | Source: Ambulatory Visit | Attending: Cardiovascular Disease | Admitting: Cardiovascular Disease

## 2023-07-03 DIAGNOSIS — I2510 Atherosclerotic heart disease of native coronary artery without angina pectoris: Secondary | ICD-10-CM | POA: Diagnosis not present

## 2023-07-03 DIAGNOSIS — Z85110 Personal history of malignant carcinoid tumor of bronchus and lung: Secondary | ICD-10-CM | POA: Insufficient documentation

## 2023-07-03 DIAGNOSIS — I700 Atherosclerosis of aorta: Secondary | ICD-10-CM | POA: Diagnosis not present

## 2023-07-03 DIAGNOSIS — C7A090 Malignant carcinoid tumor of the bronchus and lung: Secondary | ICD-10-CM

## 2023-07-03 DIAGNOSIS — K449 Diaphragmatic hernia without obstruction or gangrene: Secondary | ICD-10-CM | POA: Insufficient documentation

## 2023-07-03 DIAGNOSIS — R918 Other nonspecific abnormal finding of lung field: Secondary | ICD-10-CM | POA: Diagnosis not present

## 2023-07-04 ENCOUNTER — Encounter: Payer: Self-pay | Admitting: Family Medicine

## 2023-07-05 ENCOUNTER — Ambulatory Visit: Admitting: Physical Therapy

## 2023-07-05 ENCOUNTER — Other Ambulatory Visit: Payer: Self-pay

## 2023-07-05 MED ORDER — ROSUVASTATIN CALCIUM 20 MG PO TABS: 20.0000 mg | ORAL_TABLET | Freq: Every day | ORAL | 0 refills | Status: DC

## 2023-07-07 ENCOUNTER — Encounter: Payer: Self-pay | Admitting: Family Medicine

## 2023-07-08 NOTE — Progress Notes (Signed)
   07/08/2023  Patient ID: Christina Hayes, female   DOB: 02-16-1950, 73 y.o.   MRN: [id number]  PCP office forwarding inquiry from patient regarding next shipment of Ozempic  1mg  provided by [facility].  Patient is down to 3 doses of medication.  [facility] states refill went into processing 6/11 and should arrive at PCP office 10-14 business days from then (no later than 7/1).  Sending patient a MyChart message to make aware.  Linn Rich, PharmD, DPLA

## 2023-07-09 ENCOUNTER — Ambulatory Visit: Admitting: Thoracic Surgery (Cardiothoracic Vascular Surgery)

## 2023-07-09 ENCOUNTER — Other Ambulatory Visit: Payer: Self-pay | Admitting: Family Medicine

## 2023-07-09 DIAGNOSIS — Z1231 Encounter for screening mammogram for malignant neoplasm of breast: Secondary | ICD-10-CM

## 2023-07-15 ENCOUNTER — Other Ambulatory Visit: Payer: Self-pay | Admitting: Sports Medicine

## 2023-07-15 ENCOUNTER — Other Ambulatory Visit: Payer: Self-pay | Admitting: Family Medicine

## 2023-07-15 ENCOUNTER — Other Ambulatory Visit: Payer: Self-pay | Admitting: Allergy

## 2023-07-15 DIAGNOSIS — M7060 Trochanteric bursitis, unspecified hip: Secondary | ICD-10-CM

## 2023-07-18 ENCOUNTER — Ambulatory Visit

## 2023-07-18 ENCOUNTER — Telehealth: Payer: Self-pay

## 2023-07-18 DIAGNOSIS — J309 Allergic rhinitis, unspecified: Secondary | ICD-10-CM | POA: Diagnosis not present

## 2023-07-18 NOTE — Telephone Encounter (Signed)
 Patient assistance Ozempic  ( four - 1 ml pens)  received in office Patient  informed and will pick up medication at her next appt with Dr. ONEIDA on 07/23/2023

## 2023-07-22 ENCOUNTER — Ambulatory Visit

## 2023-07-23 ENCOUNTER — Ambulatory Visit (INDEPENDENT_AMBULATORY_CARE_PROVIDER_SITE_OTHER): Admitting: Sports Medicine

## 2023-07-23 ENCOUNTER — Encounter: Payer: Self-pay | Admitting: Sports Medicine

## 2023-07-23 DIAGNOSIS — M7061 Trochanteric bursitis, right hip: Secondary | ICD-10-CM

## 2023-07-23 NOTE — Progress Notes (Signed)
    Procedures performed today:    None.  Independent interpretation of notes and tests performed by another provider:   None.  Brief History, Exam, Impression, and Recommendations:    Greater trochanteric bursitis Christina Hayes returns, she is a very pleasant 73 year old female, we have been treating her for trochanteric bursitis, she has been doing really well with home physical therapy, at the last visit she was 70 to 80% better, she is even better today, continues with her conditioning, she has a bit of back discomfort so I have given her the lumbar spinal stenosis conditioning, but otherwise she can return to see me as needed.    ____________________________________________ Christina Hayes, M.D., ABFM., CAQSM., AME. Primary Care and Sports Medicine [facility]  Adjunct Professor of [facility] Medicine  [employer]  Restaurant manager, fast food

## 2023-07-23 NOTE — Telephone Encounter (Signed)
 Patient came into office to get her Ozempic  today, thanks.

## 2023-07-23 NOTE — Assessment & Plan Note (Signed)
 Christina Hayes returns, she is a very pleasant 73 year old female, we have been treating her for trochanteric bursitis, she has been doing really well with home physical therapy, at the last visit she was 70 to 80% better, she is even better today, continues with her conditioning, she has a bit of back discomfort so I have given her the lumbar spinal stenosis conditioning, but otherwise she can return to see me as needed.

## 2023-07-24 ENCOUNTER — Ambulatory Visit (INDEPENDENT_AMBULATORY_CARE_PROVIDER_SITE_OTHER)

## 2023-07-24 ENCOUNTER — Encounter: Payer: Self-pay | Admitting: Internal Medicine

## 2023-07-24 DIAGNOSIS — J309 Allergic rhinitis, unspecified: Secondary | ICD-10-CM

## 2023-07-30 ENCOUNTER — Ambulatory Visit (INDEPENDENT_AMBULATORY_CARE_PROVIDER_SITE_OTHER)

## 2023-07-30 DIAGNOSIS — J309 Allergic rhinitis, unspecified: Secondary | ICD-10-CM | POA: Diagnosis not present

## 2023-08-06 ENCOUNTER — Ambulatory Visit
Attending: Thoracic Surgery (Cardiothoracic Vascular Surgery) | Admitting: Thoracic Surgery (Cardiothoracic Vascular Surgery)

## 2023-08-06 ENCOUNTER — Encounter: Payer: Self-pay | Admitting: Thoracic Surgery (Cardiothoracic Vascular Surgery)

## 2023-08-06 VITALS — BP 114/69 | HR 73 | Resp 20 | Ht 62.0 in | Wt 164.0 lb

## 2023-08-06 DIAGNOSIS — R918 Other nonspecific abnormal finding of lung field: Secondary | ICD-10-CM

## 2023-08-06 DIAGNOSIS — C7A090 Malignant carcinoid tumor of the bronchus and lung: Secondary | ICD-10-CM

## 2023-08-06 NOTE — Progress Notes (Signed)
 [address]       [place] [address]             [phone]      HPI: Ms. Tranchina returns for a scheduled follow-up visit  Marquia Costello is a 73 year old woman with a history of a endobronchial carcinoid tumor, hyperlipidemia, obstructive sleep apnea, hiatal hernia, reflux, and migraines.  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  I last saw her in 2023.  She had multiple small lung nodules that were stable.  I recommended a 2-year interval CT for a full 10-year follow-up.  In the interim since her last visit she is doing well from a respiratory standpoint.  No cough, wheezing, shortness of breath.  Is having some back issues.  Past Medical History:  Diagnosis Date   Acid reflux    Allergic rhinitis, cause unspecified    Allergy    Anemia    years ago    Cataract    Complication of anesthesia SLOW TO WAKE   Cough    DJD (degenerative joint disease)    spine   Frequency of urination    H/O hiatal hernia    small   Head cold    Headache(784.0)    migraqine- uses essential oils   HYPERLIPIDEMIA, MIXED 12/03/2006   PT STATES NOT TAKING MED AS PRESCRIBED   MIGRAINES, HX OF 12/03/2006   Neuromuscular disorder (HCC)    Nocturia    OSA on CPAP    Osteopenia    PONV (postoperative nausea and vomiting)    in past   Shortness of breath    Sleep apnea    cpap   SUI (stress urinary incontinence, female)    Urgency of urination    VERTIGO, HX OF 12/03/2006    Current Outpatient Medications  Medication Sig Dispense Refill   alendronate  (FOSAMAX ) 70 MG tablet Take 1 tablet (70 mg total) by mouth every 7 (seven) days. Take with a full glass of water  on an empty stomach. 12 tablet 3   AMBULATORY NON FORMULARY MEDICATION Please provide cpap supplies: heated tubing, humidifier chamber, disposable filters, Mask/Mask frame/Head gear per patient preference 1 Units  0   diclofenac  (VOLTAREN ) 75 MG EC tablet Take 1 tablet (75 mg total) by mouth 2 (two) times daily. 60 tablet 3   EPINEPHrine  0.3 mg/0.3 mL IJ SOAJ injection Inject 0.3 mg into the muscle as needed for anaphylaxis. 3 each 1   furosemide  (LASIX ) 20 MG tablet Take 1 tablet (20 mg total) by mouth daily. (Patient taking differently: Take 20 mg by mouth daily as needed.) 100 tablet 2   ipratropium (ATROVENT ) 0.03 % nasal spray USE 2 SPRAYS IN BOTH NOSTRILS  DAILY 30 mL 0   levocetirizine (XYZAL ) 5 MG tablet TAKE 1 TABLET BY MOUTH IN THE  EVENING 30 tablet 0   montelukast  (SINGULAIR ) 10 MG tablet Take 1 tablet (10 mg total) by mouth at bedtime. (Patient taking differently: Take 10 mg by mouth daily as needed.) 100 tablet 2   Multiple Vitamin (MULTIVITAMIN) tablet Take 1 tablet by mouth daily.     OZEMPIC , 1 MG/DOSE, 4 MG/3ML SOPN INJECT 1 MG UNDER THE SKIN ONCE A WEEK 3 mL 0   pantoprazole  (PROTONIX ) 40 MG tablet Take 1 tablet (40 mg total) by mouth daily. 100 tablet 2   rosuvastatin  (CRESTOR ) 20 MG tablet Take 1 tablet (20 mg  total) by mouth daily. 100 tablet 0   SUMAtriptan  (IMITREX ) 50 MG tablet TAKE 1 TABLET BY MOUTH AS NEEDED FOR MIGRAINE, MAY REPEAT IN 2  HOURS IF HEADACHE PERSISTS OR  RECURS. MANUFACTURER RECOMMENDS  NOT EXCEEDING 200MG /DAY 40 tablet 0   tiZANidine  (ZANAFLEX ) 4 MG tablet Take 4 mg by mouth every 6 (six) hours as needed for muscle spasms.     triamcinolone  cream (KENALOG ) 0.1 % Apply 1 Application topically 2 (two) times daily. 453.6 g 3   zolpidem  (AMBIEN ) 5 MG tablet Take 1 tablet (5 mg total) by mouth at bedtime as needed for sleep. 90 tablet 1   No current facility-administered medications for this visit.    Physical Exam BP 114/69   Pulse 73   Resp 20   Ht 5' 2 (1.575 m)   Wt 164 lb (74.4 kg)   SpO2 96% Comment: RA  BMI 30.94 kg/m  73 year old woman in no acute distress Alert and oriented x 3 with no focal deficits Lungs clear with equal breath sounds  bilaterally Cardiac regular rate and rhythm No cervical sequelae for adenopathy  Diagnostic Tests: CT CHEST WITHOUT CONTRAST   TECHNIQUE: Multidetector CT imaging of the chest was performed following the standard protocol without IV contrast.   RADIATION DOSE REDUCTION: This exam was performed according to the departmental dose-optimization program which includes automated exposure control, adjustment of the mA and/or kV according to patient size and/or use of iterative reconstruction technique.   COMPARISON:  06/30/2021 chest CT.   FINDINGS: Cardiovascular: Normal heart size. No significant pericardial effusion/thickening. Three-vessel coronary atherosclerosis. Atherosclerotic nonaneurysmal thoracic aorta. Normal caliber pulmonary arteries.   Mediastinum/Nodes: No significant thyroid  nodules. Unremarkable esophagus. No pathologically enlarged axillary, mediastinal or hilar lymph nodes, noting limited sensitivity for the detection of hilar adenopathy on this noncontrast study.   Lungs/Pleura: No pneumothorax. No pleural effusion. No acute consolidative airspace disease or lung masses. Previously visualized scattered small solid pulmonary nodules in both lungs measuring up to 0.5 cm in the posterior right upper lobe on series 302/image 39, all stable from 06/30/2021 CT. Solitary new solid 0.4 cm right upper lobe pulmonary nodule on series 302/image 37.   Upper abdomen: Small hiatal hernia.   Musculoskeletal: No aggressive appearing focal osseous lesions. Mild thoracic spondylosis.   IMPRESSION: 1. Solitary new solid 0.4 cm right upper lobe pulmonary nodule. Follow-up chest CT recommended in 3-6 months given history of pulmonary carcinoid. 2. Previously visualized scattered small pulmonary nodules remain stable and are presumably benign. 3. Three-vessel coronary atherosclerosis. 4. Small hiatal hernia. 5.  Aortic Atherosclerosis (ICD10-I70.0).     Electronically  Signed   By: Selinda DELENA Blue M.D.   On: 07/14/2023 14:38 I personally reviewed the CT images.  Multiple stable lung nodules.  New 2 x 4 mm opacity in the inferior right upper lobe.  Aortic and coronary calcification.  Impression: Kona Yusuf is a 73 year old woman with a history of a endobronchial carcinoid tumor, hyperlipidemia, obstructive sleep apnea, hiatal hernia, reflux, and migraines.  Endobronchial carcinoid tumor left mainstem bronchus-now 10 years out from endobronchial resection and laser ablation with no evidence of recurrence.  New right upper lobe lung nodule-cylindrical nodule measuring about 2 x 4 mm.  Could be inspissated mucus.  Cannot rule out another carcinoid tumor.  I think 3 to 6 months is way too soon to rescan.  She is a lifelong non-smoker.  Will plan to rescan in a year.  Plan: Return in 1 year with CT chest  I spent over 20 minutes in review of records, images, and consultation with Ms.Flinchum today.  Elspeth JAYSON Millers, MD [facility] [phone]

## 2023-08-08 ENCOUNTER — Ambulatory Visit

## 2023-08-08 DIAGNOSIS — J309 Allergic rhinitis, unspecified: Secondary | ICD-10-CM

## 2023-08-10 ENCOUNTER — Other Ambulatory Visit: Payer: Self-pay | Admitting: Allergy

## 2023-08-12 ENCOUNTER — Ambulatory Visit
Admission: RE | Admit: 2023-08-12 | Discharge: 2023-08-12 | Disposition: A | Discharge status: Home / Self Care | Source: Ambulatory Visit | Attending: Family Medicine | Admitting: Family Medicine

## 2023-08-12 DIAGNOSIS — Z1231 Encounter for screening mammogram for malignant neoplasm of breast: Secondary | ICD-10-CM | POA: Diagnosis not present

## 2023-08-21 ENCOUNTER — Encounter: Payer: Self-pay | Admitting: Family Medicine

## 2023-08-21 DIAGNOSIS — R7303 Prediabetes: Secondary | ICD-10-CM

## 2023-08-21 MED ORDER — OZEMPIC (0.25 OR 0.5 MG/DOSE) 2 MG/3ML ~~LOC~~ SOPN: 0.5000 mg | PEN_INJECTOR | SUBCUTANEOUS | 3 refills | Status: AC

## 2023-08-22 ENCOUNTER — Other Ambulatory Visit: Payer: Self-pay | Admitting: Allergy

## 2023-08-22 ENCOUNTER — Other Ambulatory Visit (HOSPITAL_COMMUNITY): Payer: Self-pay

## 2023-08-23 ENCOUNTER — Telehealth: Payer: Self-pay

## 2023-08-23 ENCOUNTER — Other Ambulatory Visit

## 2023-08-23 NOTE — Progress Notes (Signed)
   08/23/2023  Patient ID: Christina Hayes, female   DOB: 14-Apr-1950, 73 y.o.   MRN: [id number]  Subjective/Objective: Telephone visit to discuss management of diabetes  Diabetes Management Plan -Current medications:  Ozempic  1mg  weekly -Patient endorses some stomach upset (diarrhea, nausea), dizziness, and fatigue since increasing Ozempic  dose to 1mg  weekly -A1c in April very well controlled at 5.5% -Patient receives Ozempic  through [facility] PAP and has a few 1mg  pens on hand  Assessment/Plan  Diabetes Management Plan -Recommended that patient decrease Ozempic  dose to 0.5mg  weekly by dialing 36 cilcks on her 1mg  pens until lower dose rec'd from [facility] -Coordinating with medication assistance team for order change for Ozempic  0.25/0.5mg  pens to go to [facility] PAP -Sees PCP again in October and will be due for A1c  Follow-up:  5 weeks  Christina Hayes, PharmD, DPLA

## 2023-08-23 NOTE — Telephone Encounter (Signed)
 Faxed dose/change order form to providers office for Ozempic  0.5mg  weekly (novo nordisk) for review and signature per request.

## 2023-09-03 ENCOUNTER — Encounter: Payer: Self-pay | Admitting: Internal Medicine

## 2023-09-03 ENCOUNTER — Ambulatory Visit (AMBULATORY_SURGERY_CENTER): Admitting: *Deleted

## 2023-09-03 VITALS — Ht 62.0 in | Wt 163.0 lb

## 2023-09-03 DIAGNOSIS — Z83719 Family history of colon polyps, unspecified: Secondary | ICD-10-CM

## 2023-09-03 DIAGNOSIS — Z860100 Personal history of colon polyps, unspecified: Secondary | ICD-10-CM

## 2023-09-03 DIAGNOSIS — Z800 Family history of malignant neoplasm of digestive organs: Secondary | ICD-10-CM

## 2023-09-03 MED ORDER — NA SULFATE-K SULFATE-MG SULF 17.5-3.13-1.6 GM/177ML PO SOLN: 1.0000 | Freq: Once | ORAL | 0 refills | Status: AC

## 2023-09-03 NOTE — Progress Notes (Signed)
 Pt's name and DOB verified at the beginning of the pre-visit with 2 identifiers  Permission given to speak with  Pt denies any difficulty with ambulating,sitting, laying down or rolling side to side   Pt uses ambulation assistance device or has issues with mobiiity  Pt has no issues moving head neck or swallowing  No egg or soy allergy known to patient   Hx of PONV and slow to wake up per pt  No FH of Malignant Hyperthermia  Pt is not on home 02   Pt is not on blood thinners   Pt denies issues with constipation   Pt is not on dialysis  Pt denise any abnormal heart rhythms   Pt denies any upcoming cardiac testing  Patient's chart reviewed by Norleen Schillings CNRA prior to pre-visit and patient appropriate for the [facility].  Pre-visit completed and red dot placed by patient's name on their procedure day (on provider's schedule).    Visit by phone  Pt states weight is 163 lb   Instructions reviewed. Pt given  both [facility] main # and MD on call # prior to instructions.   Informed pt to come in at the time discussed and is shown on PV instructions. Pt informed that they are coming in i.e. cloths change.,IV placement , Consent signing and meeting CRNA. Pt states understanding after  given opportunity to ask questions t after all  instructions given.  Instructed pt to review instructions again prior to procedure and call main # given if has any questions.. Pt states they will.  Instructed pt where to find PV instructions in My Chart

## 2023-09-04 ENCOUNTER — Encounter: Payer: Self-pay | Admitting: Family Medicine

## 2023-09-06 ENCOUNTER — Ambulatory Visit (INDEPENDENT_AMBULATORY_CARE_PROVIDER_SITE_OTHER): Admitting: *Deleted

## 2023-09-06 DIAGNOSIS — J309 Allergic rhinitis, unspecified: Secondary | ICD-10-CM

## 2023-09-16 ENCOUNTER — Telehealth: Payer: Self-pay

## 2023-09-16 NOTE — Telephone Encounter (Signed)
 Forwarding to [place] as an Financial planner.  Panya  [employer]ovo [employer]isk PAP shipment for Ozempic  00.25/0.5 mg dose / 4 boxes received this morning. Please contact the patient to come and pick up their order today. Placed in the PAP fridge with patient identifier. Thanks in advance.   NDC: 9830-5818-86 LOT: [id number] EXP: 2026-02-21

## 2023-09-23 NOTE — Progress Notes (Unsigned)
 [facility] History and Physical   Primary Care Physician:  Alvia Bring, DO   Reason for Procedure:    Encounter Diagnoses  Name Primary?   Hx of adenomatous polyp of colon Yes   Family history of malignant neoplasm of gastrointestinal tract      Plan:    Colonoscopy     HPI: Christina Hayes is a 73 y.o. female status post removal of an 8 mm adenoma in 2020 by Dr. Aneita.  That colonoscopy also showed internal hemorrhoids.  Patient has a family history of colon cancer in her mother, also.   Past Medical History:  Diagnosis Date   Acid reflux    Allergic rhinitis, cause unspecified    Allergy    Anemia    years ago    Complication of anesthesia SLOW TO WAKE   Cough    DJD (degenerative joint disease)    spine   Frequency of urination    H/O hiatal hernia    small   Head cold    Headache(784.0)    migraqine- uses essential oils   HYPERLIPIDEMIA, MIXED 12/03/2006   PT STATES NOT TAKING MED AS PRESCRIBED   MIGRAINES, HX OF 12/03/2006   Neuromuscular disorder (HCC)    Nocturia    OSA on CPAP    Osteopenia    Osteoporosis    PONV (postoperative nausea and vomiting)    in past   Shortness of breath    Sleep apnea    cpap   SUI (stress urinary incontinence, female)    Urgency of urination    VERTIGO, HX OF 12/03/2006    Past Surgical History:  Procedure Laterality Date   COLONOSCOPY     PUBOVAGINAL SLING  04/30/2011   Procedure: CARLOYN GLADE;  Surgeon: Alm GORMAN Fragmin, MD;  Location: [facility];  Service: Urology;  Laterality: N/A;  sub urethral sling    possible ower   RIGID BRONCHOSCOPY N/A 04/23/2013   Procedure: LASER BRONCHOSCOPY;  Surgeon: Elspeth JAYSON Millers, MD;  Location[facility];  Service: Thoracic;  Laterality: N/A;   SALPINGOOPHORECTOMY  1995   BILATERAL   VAGINAL HYSTERECTOMY  1988   VIDEO BRONCHOSCOPY Bilateral 03/26/2013   Procedure: VIDEO BRONCHOSCOPY WITHOUT FLUORO;  Surgeon: Ozell KATHEE America, MD;   Location: [facility];  Service: Cardiopulmonary;  Laterality: Bilateral;   VIDEO BRONCHOSCOPY N/A 04/23/2013   Procedure: VIDEO BRONCHOSCOPY;  Surgeon: Elspeth JAYSON Millers, MD;  Location: [facility] OR;  Service: Thoracic;  Laterality: N/A;   VIDEO BRONCHOSCOPY N/A 05/28/2014   Procedure: VIDEO BRONCHOSCOPY;  Surgeon: Elspeth JAYSON Millers, MD;  Location: [facility] OR;  Service: Thoracic;  Laterality: N/A;     Current Outpatient Medications  Medication Sig Dispense Refill   alendronate  (FOSAMAX ) 70 MG tablet Take 1 tablet (70 mg total) by mouth every 7 (seven) days. Take with a full glass of water  on an empty stomach. 12 tablet 3   AMBULATORY NON FORMULARY MEDICATION Please provide cpap supplies: heated tubing, humidifier chamber, disposable filters, Mask/Mask frame/Head gear per patient preference 1 Units 0   diclofenac  (VOLTAREN ) 75 MG EC tablet Take 1 tablet (75 mg total) by mouth 2 (two) times daily. 60 tablet 3   EPINEPHrine  0.3 mg/0.3 mL IJ SOAJ injection Inject 0.3 mg into the muscle as needed for anaphylaxis. 3 each 1   furosemide  (LASIX ) 20 MG tablet Take 1 tablet (20 mg total) by mouth daily. (Patient taking differently: Take 20 mg by mouth as needed.) 100 tablet 2  ipratropium (ATROVENT ) 0.03 % nasal spray USE 2 SPRAYS IN BOTH NOSTRILS  DAILY 30 mL 0   levocetirizine (XYZAL ) 5 MG tablet TAKE 1 TABLET BY MOUTH IN THE  EVENING 30 tablet 0   montelukast  (SINGULAIR ) 10 MG tablet Take 1 tablet (10 mg total) by mouth at bedtime. (Patient not taking: Reported on 09/03/2023) 100 tablet 2   Multiple Vitamin (MULTIVITAMIN) tablet Take 1 tablet by mouth daily.     pantoprazole  (PROTONIX ) 40 MG tablet Take 1 tablet (40 mg total) by mouth daily. 100 tablet 2   rosuvastatin  (CRESTOR ) 20 MG tablet Take 1 tablet (20 mg total) by mouth daily. 100 tablet 0   Semaglutide ,0.25 or 0.5MG /DOS, (OZEMPIC , 0.25 OR 0.5 MG/DOSE,) 2 MG/3ML SOPN Inject 0.5 mg into the skin once a week. 3 mL 3   SUMAtriptan  (IMITREX ) 50 MG  tablet TAKE 1 TABLET BY MOUTH AS NEEDED FOR MIGRAINE, MAY REPEAT IN 2  HOURS IF HEADACHE PERSISTS OR  RECURS. MANUFACTURER RECOMMENDS  NOT EXCEEDING 200MG /DAY (Patient taking differently: as needed.) 40 tablet 0   tiZANidine  (ZANAFLEX ) 4 MG tablet Take 4 mg by mouth every 6 (six) hours as needed for muscle spasms.     triamcinolone  cream (KENALOG ) 0.1 % Apply 1 Application topically 2 (two) times daily. (Patient taking differently: Apply 1 Application topically as needed.) 453.6 g 3   zolpidem  (AMBIEN ) 5 MG tablet Take 1 tablet (5 mg total) by mouth at bedtime as needed for sleep. 90 tablet 1   No current facility-administered medications for this visit.    Allergies as of 09/24/2023 - Review Complete 09/03/2023  Allergen Reaction Noted   Trazodone  and nefazodone Other (See Comments) 12/30/2018   Codeine Itching 10/05/2010   Protriptyline Palpitations 07/13/2015   Tramadol Other (See Comments) 03/05/2013   Vivactil [protriptyline hcl] Palpitations 01/26/2014    Family History  Problem Relation Age of Onset   Diabetes Mother    Colon cancer Mother    Diabetes Father    Hyperlipidemia Father    Alzheimer's disease Father    Breast cancer Daughter 22       stage III   Allergies Daughter    Breast cancer Maternal Grandmother 73   Colon polyps Brother    Esophageal cancer Neg Hx    Rectal cancer Neg Hx    Stomach cancer Neg Hx     Social History   Socioeconomic History   Marital status: Married    Spouse name: Wadie   Number of children: 1   Years of education: 12   Highest education level: 12th grade  Occupational History   Occupation: Retired    Comment: Psychologist, prison and probation services  Tobacco Use   Smoking status: Never   Smokeless tobacco: Never  Vaping Use   Vaping status: Never Used  Substance and Sexual Activity   Alcohol use: No   Drug use: No   Sexual activity: Not Currently    Partners: Male    Birth control/protection: Surgical  Other Topics Concern   Not on file   Social History Narrative   Lives with her husband. She has one child. She enjoys reading.   Social Drivers of Corporate investment banker Strain: Low Risk  (07/21/2023)   Overall Financial Resource Strain (CARDIA)    Difficulty of Paying Living Expenses: Not hard at all  Food Insecurity: No Food Insecurity (07/21/2023)   Hunger Vital Sign    Worried About Running Out of Food in the Last Year: Never true  Ran Out of Food in the Last Year: Never true  Transportation Needs: No Transportation Needs (07/21/2023)   PRAPARE - Administrator, Civil Service (Medical): No    Lack of Transportation (Non-Medical): No  Physical Activity: Insufficiently Active (07/21/2023)   Exercise Vital Sign    Days of Exercise per Week: 2 days    Minutes of Exercise per Session: 40 min  Stress: No Stress Concern Present (06/27/2023)   [employer] of Occupational Health - Occupational Stress Questionnaire    Feeling of Stress : Not at all  Social Connections: Socially Integrated (07/21/2023)   Social Connection and Isolation Panel    Frequency of Communication with Friends and Family: Three times a week    Frequency of Social Gatherings with Friends and Family: Once a week    Attends Religious Services: More than 4 times per year    Active Member of [employer] or Organizations: Yes    Attends Engineer, structural: More than 4 times per year    Marital Status: Married  Recent Concern: Social Connections - Moderately Isolated (06/27/2023)   Social Connection and Isolation Panel    Frequency of Communication with Friends and Family: More than three times a week    Frequency of Social Gatherings with Friends and Family: Once a week    Attends Religious Services: Never    Database administrator or Organizations: No    Attends Banker Meetings: Never    Marital Status: Married  Catering manager Violence: Not At Risk (06/27/2023)   Humiliation, Afraid, Rape, and Kick questionnaire     Fear of Current or Ex-Partner: No    Emotionally Abused: No    Physically Abused: No    Sexually Abused: No    Review of Systems: Positive for *** All other review of systems negative except as mentioned in the HPI.  Physical Exam: Vital signs There were no vitals taken for this visit.  General:   Alert,  Well-developed, well-nourished, pleasant and cooperative in NAD Lungs:  Clear throughout to auscultation.   Heart:  Regular rate and rhythm; no murmurs, clicks, rubs,  or gallops. Abdomen:  Soft, nontender and nondistended. Normal bowel sounds.   Neuro/Psych:  Alert and cooperative. Normal mood and affect. A and O x 3   @Chaslyn Eisen  CHARLENA Commander, MD, [facility] [phone] (pager) 09/23/2023 4:04 PM@

## 2023-09-24 ENCOUNTER — Ambulatory Visit (AMBULATORY_SURGERY_CENTER): Admitting: Internal Medicine

## 2023-09-24 ENCOUNTER — Encounter: Payer: Self-pay | Admitting: Internal Medicine

## 2023-09-24 ENCOUNTER — Encounter: Payer: Self-pay | Admitting: Sports Medicine

## 2023-09-24 VITALS — BP 126/57 | HR 60 | Temp 97.7°F | Resp 12 | Ht 62.0 in | Wt 163.0 lb

## 2023-09-24 DIAGNOSIS — Z1211 Encounter for screening for malignant neoplasm of colon: Secondary | ICD-10-CM | POA: Diagnosis not present

## 2023-09-24 DIAGNOSIS — Z800 Family history of malignant neoplasm of digestive organs: Secondary | ICD-10-CM | POA: Diagnosis not present

## 2023-09-24 DIAGNOSIS — K5730 Diverticulosis of large intestine without perforation or abscess without bleeding: Secondary | ICD-10-CM | POA: Diagnosis not present

## 2023-09-24 DIAGNOSIS — Z860101 Personal history of adenomatous and serrated colon polyps: Secondary | ICD-10-CM

## 2023-09-24 DIAGNOSIS — K649 Unspecified hemorrhoids: Secondary | ICD-10-CM

## 2023-09-24 MED ORDER — SODIUM CHLORIDE 0.9 % IV SOLN: 500.0000 mL | Freq: Once | INTRAVENOUS | Status: DC

## 2023-09-24 NOTE — Progress Notes (Signed)
 To pacu, VSs. Report to Rn.tb

## 2023-09-24 NOTE — Patient Instructions (Addendum)
 Please read handouts provided. Continue present medications. Resume previous diet. Repeat colonoscopy in 5 years if vigorous.   YOU HAD AN ENDOSCOPIC PROCEDURE TODAY AT THE [facility]:   Refer to the procedure report that was given to you for any specific questions about what was found during the examination.  If the procedure report does not answer your questions, please call your gastroenterologist to clarify.  If you requested that your care partner not be given the details of your procedure findings, then the procedure report has been included in a sealed envelope for you to review at your convenience later.  YOU SHOULD EXPECT: Some feelings of bloating in the abdomen. Passage of more gas than usual.  Walking can help get rid of the air that was put into your GI tract during the procedure and reduce the bloating. If you had a lower endoscopy (such as a colonoscopy or flexible sigmoidoscopy) you may notice spotting of blood in your stool or on the toilet paper. If you underwent a bowel prep for your procedure, you may not have a normal bowel movement for a few days.  Please Note:  You might notice some irritation and congestion in your nose or some drainage.  This is from the oxygen used during your procedure.  There is no need for concern and it should clear up in a day or so.  SYMPTOMS TO REPORT IMMEDIATELY:  Following lower endoscopy (colonoscopy or flexible sigmoidoscopy):  Excessive amounts of blood in the stool  Significant tenderness or worsening of abdominal pains  Swelling of the abdomen that is new, acute  Fever of 100F or higher.  For urgent or emergent issues, a gastroenterologist can be reached at any hour by calling [phone]. Do not use MyChart messaging for urgent concerns.    DIET:  We do recommend a small meal at first, but then you may proceed to your regular diet.  Drink plenty of fluids but you should avoid alcoholic beverages for 24  hours.  ACTIVITY:  You should plan to take it easy for the rest of today and you should NOT DRIVE or use heavy machinery until tomorrow (because of the sedation medicines used during the test).    FOLLOW UP: Our staff will call the number listed on your records the next business day following your procedure.  We will call around 7:15- 8:00 am to check on you and address any questions or concerns that you may have regarding the information given to you following your procedure. If we do not reach you, we will leave a message.     If any biopsies were taken you will be contacted by phone or by letter within the next 1-3 weeks.  Please call us  at [phone] if you have not heard about the biopsies in 3 weeks.    SIGNATURES/CONFIDENTIALITY: You and/or your care partner have signed paperwork which will be entered into your electronic medical record.  These signatures attest to the fact that that the information above on your After Visit Summary has been reviewed and is understood.  Full responsibility of the confidentiality of this discharge information lies with you and/or your care-partner.No polyps today.  You do have diverticulosis - thickened muscle rings and pouches in the colon wall. Please read the handout about this condition. Hemorrhoids also seen (as you know).  Your next routine colonoscopy should be in 5 years - 2030, if you remain vigorous at age 73.  I appreciate the opportunity to care  for you. Lupita CHARLENA Commander, MD, [facility]

## 2023-09-24 NOTE — Progress Notes (Signed)
 Pt's states no medical or surgical changes since previsit or office visit.

## 2023-09-24 NOTE — Op Note (Signed)
 [facility] Patient Name: Christina Hayes Procedure Date: 09/24/2023 9:03 AM MRN: [id number] Endoscopist: Lupita FORBES Commander , MD, [id number] Age: 73 Referring MD:  Date of Birth: 04-02-1950 Gender: Female Account #: [id number] Procedure:                Colonoscopy Indications:              Surveillance: Personal history of adenomatous                            polyps on last colonoscopy 5 years ago. Mother had                            CRCA age 65-65. Medicines:                Monitored Anesthesia Care Procedure:                Pre-Anesthesia Assessment:                           - Prior to the procedure, a History and Physical                            was performed, and patient medications and                            allergies were reviewed. The patient's tolerance of                            previous anesthesia was also reviewed. The risks                            and benefits of the procedure and the sedation                            options and risks were discussed with the patient.                            All questions were answered, and informed consent                            was obtained. Prior Anticoagulants: The patient has                            taken no anticoagulant or antiplatelet agents. ASA                            Grade Assessment: II - A patient with mild systemic                            disease. After reviewing the risks and benefits,                            the patient was deemed in satisfactory condition to  undergo the procedure.                           After obtaining informed consent, the colonoscope                            was passed under direct vision. Throughout the                            procedure, the patient's blood pressure, pulse, and                            oxygen saturations were monitored continuously. The                            Olympus Scope SN: [id number] was introduced  through                            the anus and advanced to the the cecum, identified                            by appendiceal orifice and ileocecal valve. The                            colonoscopy was performed without difficulty. The                            patient tolerated the procedure well. The quality                            of the bowel preparation was good. The ileocecal                            valve, appendiceal orifice, and rectum were                            photographed. The bowel preparation used was SUPREP                            via split dose instruction. Scope In: 9:16:41 AM Scope Out: 9:27:24 AM Scope Withdrawal Time: 0 hours 8 minutes 8 seconds  Total Procedure Duration: 0 hours 10 minutes 43 seconds  Findings:                 Hemorrhoids were found on perianal exam.                           Multiple diverticula were found in the sigmoid                            colon.                           The exam was otherwise without abnormality on  direct and retroflexion views. Complications:            No immediate complications. Estimated Blood Loss:     Estimated blood loss: none. Impression:               - Hemorrhoids found on perianal exam.                           - Diverticulosis in the sigmoid colon.                           - The examination was otherwise normal on direct                            and retroflexion views.                           - No specimens collected.                           - Personal history of colonic polyp 8 mm adenoma                            removed 2020. Mother had CRCA 52-65.SABRA Recommendation:           - Patient has a contact number available for                            emergencies. The signs and symptoms of potential                            delayed complications were discussed with the                            patient. Return to normal activities tomorrow.                             Written discharge instructions were provided to the                            patient.                           - Resume previous diet.                           - Continue present medications.                           - Repeat colonoscopy in 5 years if vigorous. Lupita FORBES Commander, MD 09/24/2023 9:35:41 AM This report has been signed electronically.

## 2023-09-25 ENCOUNTER — Telehealth: Payer: Self-pay

## 2023-09-25 NOTE — Telephone Encounter (Signed)
  Follow up Call-     09/24/2023    8:15 AM  Call back number  Post procedure Call Back phone  # [phone]  Permission to leave phone message Yes     Patient questions:  Do you have a fever, pain , or abdominal swelling? No. Pain Score  0 *  Have you tolerated food without any problems? Yes.    Have you been able to return to your normal activities? Yes.    Do you have any questions about your discharge instructions: Diet   No. Medications  No. Follow up visit  No.  Do you have questions or concerns about your Care? No.  Actions: * If pain score is 4 or above: No action needed, pain <4.

## 2023-09-30 ENCOUNTER — Other Ambulatory Visit: Payer: Self-pay

## 2023-09-30 NOTE — Progress Notes (Signed)
   09/30/2023  Patient ID: Christina Hayes, female   DOB: 29-Jan-1950, 73 y.o.   MRN: [id number]  Subjective/Objective: Telephone visit to discuss management of pre-diabetes  Pre-diabetes Management Plan -Current medications:  Ozempic  0.5mg  weekly  -Ozempic  was recently decreased to 0.5mg  weekly due to stomach upset (n/v/d) with 1mg  dose -Patient skipped dose last week b/c of scheduled colonoscopy; medication was resumed yesterday, and today she has experienced diarrhea.  Using loperamide OTC, which is helping -A1c in April very well controlled at 5.5% -Patient receives Ozempic  through [facility] and plans to pick recent medication delivery of 4 boxes of Ozempic  0.2/0.5mg  up from [facility] Thursday  Assessment/Plan  Diabetes Management Plan -Continue current regimen at this time -Use loperamide OTC as needed for diarrhea  Follow-up:  2 weeks  Christina Hayes, PharmD, DPLA

## 2023-10-03 ENCOUNTER — Ambulatory Visit (INDEPENDENT_AMBULATORY_CARE_PROVIDER_SITE_OTHER)

## 2023-10-03 DIAGNOSIS — J309 Allergic rhinitis, unspecified: Secondary | ICD-10-CM

## 2023-10-03 NOTE — Telephone Encounter (Signed)
 Patient came into office to get her Ozempic  on 10/03/23, logged and got patients signature in white binder, thanks.

## 2023-10-14 ENCOUNTER — Other Ambulatory Visit: Payer: Self-pay

## 2023-10-14 DIAGNOSIS — R7303 Prediabetes: Secondary | ICD-10-CM

## 2023-10-14 NOTE — Progress Notes (Signed)
   10/14/2023  Patient ID: Christina Hayes, female   DOB: [birth year], 73 y.o.   MRN: [id number]  Subjective/Objective: Telephone visit to discuss management of pre-diabetes  Pre-diabetes Management Plan -Current medications:  Ozempic  0.5mg  weekly  -Ozempic  was recently decreased to 0.5mg  weekly due to stomach upset (n/v/d) with 1mg  dose -Patient is tolerating Ozempic  at 0.5mg  weekly well; will still use loperamide if needed for diarrhea, but this has occurred much less frequently -A1c in April very well controlled at 5.5% -Patient receives Ozempic  through [facility] and has picked up recent shipment of Ozempic  0.25/0.5mg  (4 boxes).  Still counting clicks on 1mg  pens at home to administer 0.5mg  dose and not waste pens; but will switch to lower dose pens once these are gone.  Assessment/Plan  Diabetes Management Plan -Continue current regimen at this time -Use loperamide OTC as needed for diarrhea -Sees PCP again 10/16 and will be due for A1c  Follow-up:  11/17  Channing DELENA Mealing, PharmD, DPLA

## 2023-10-18 ENCOUNTER — Other Ambulatory Visit: Payer: Self-pay | Admitting: Allergy

## 2023-10-21 NOTE — Telephone Encounter (Signed)
 Atrovent  refill denied at [facility]. Patient is due for an OV. Patient has an appt on 10/24/23.

## 2023-10-24 ENCOUNTER — Other Ambulatory Visit: Payer: Self-pay | Admitting: Allergy

## 2023-10-24 ENCOUNTER — Other Ambulatory Visit: Payer: Self-pay

## 2023-10-24 ENCOUNTER — Encounter: Payer: Self-pay | Admitting: Allergy

## 2023-10-24 ENCOUNTER — Ambulatory Visit: Admitting: Allergy

## 2023-10-24 VITALS — BP 120/78 | HR 83 | Temp 98.2°F

## 2023-10-24 DIAGNOSIS — J3089 Other allergic rhinitis: Secondary | ICD-10-CM | POA: Diagnosis not present

## 2023-10-24 DIAGNOSIS — H1045 Other chronic allergic conjunctivitis: Secondary | ICD-10-CM | POA: Diagnosis not present

## 2023-10-24 MED ORDER — IPRATROPIUM BROMIDE 0.03 % NA SOLN: 2.0000 | Freq: Every day | NASAL | 5 refills | Status: AC

## 2023-10-24 MED ORDER — EPINEPHRINE 0.3 MG/0.3ML IJ SOAJ: 0.3000 mg | INTRAMUSCULAR | 1 refills | Status: DC | PRN

## 2023-10-24 NOTE — Progress Notes (Signed)
 Follow-up Note  RE: Christina Hayes MRN: [id number] DOB: [birth year] Date of Office Visit: 10/24/2023   History of present illness: Christina Hayes is a 73 y.o. female presenting today for follow-up of allergic rhinitis with conjunctivitis.  She was last seen in the office on 05/24/2022 by myself. Discussed the use of AI scribe software for clinical note transcription with the patient, who gave verbal consent to proceed.  She is in her third year of receiving allergy shots and is currently on a monthly dosing schedule. This year, she reports not recalling specific symptoms from last year, but states her symptoms are much better than she used to be. She has experienced only one or two episodes of symptoms such as sneezing and rhinorrhea, each lasting about three to four days. She is unsure whether she is currently taking Xyzal  or Singulair , but she has been managing her symptoms with 1 of these medications that she does take daily. She also mentions having expired nasal spray, which she did not use during a recent flare-up.  She has not needed to use eye drops this year and reports that her allergy symptoms have been minimal despite the challenging pollen season.      Review of systems: 10pt ROS negative unless noted above in HPI  Past medical/social/surgical/family history have been reviewed and are unchanged unless specifically indicated below.  No changes  Medication List: Current Outpatient Medications  Medication Sig Dispense Refill   EPINEPHrine  0.3 mg/0.3 mL IJ SOAJ injection Inject 0.3 mg into the muscle as needed for anaphylaxis. 3 each 1   furosemide  (LASIX ) 20 MG tablet Take 1 tablet (20 mg total) by mouth daily. 100 tablet 2   montelukast  (SINGULAIR ) 10 MG tablet Take 1 tablet (10 mg total) by mouth at bedtime. 100 tablet 2   alendronate  (FOSAMAX ) 70 MG tablet Take 1 tablet (70 mg total) by mouth every 7 (seven) days. Take with a full glass of water  on an empty stomach. 12 tablet  3   AMBULATORY NON FORMULARY MEDICATION Please provide cpap supplies: heated tubing, humidifier chamber, disposable filters, Mask/Mask frame/Head gear per patient preference 1 Units 0   diclofenac  (VOLTAREN ) 75 MG EC tablet Take 1 tablet (75 mg total) by mouth 2 (two) times daily. 60 tablet 3   ipratropium (ATROVENT ) 0.03 % nasal spray USE 2 SPRAYS IN BOTH NOSTRILS  DAILY 30 mL 0   levocetirizine (XYZAL ) 5 MG tablet TAKE 1 TABLET BY MOUTH IN THE  EVENING 30 tablet 0   Multiple Vitamin (MULTIVITAMIN) tablet Take 1 tablet by mouth daily.     pantoprazole  (PROTONIX ) 40 MG tablet Take 1 tablet (40 mg total) by mouth daily. 100 tablet 2   rosuvastatin  (CRESTOR ) 20 MG tablet Take 1 tablet (20 mg total) by mouth daily. 100 tablet 0   Semaglutide ,0.25 or 0.5MG /DOS, (OZEMPIC , 0.25 OR 0.5 MG/DOSE,) 2 MG/3ML SOPN Inject 0.5 mg into the skin once a week. 3 mL 3   SUMAtriptan  (IMITREX ) 50 MG tablet TAKE 1 TABLET BY MOUTH AS NEEDED FOR MIGRAINE, MAY REPEAT IN 2  HOURS IF HEADACHE PERSISTS OR  RECURS. MANUFACTURER RECOMMENDS  NOT EXCEEDING 200MG /DAY (Patient taking differently: as needed.) 40 tablet 0   tiZANidine  (ZANAFLEX ) 4 MG tablet Take 4 mg by mouth every 6 (six) hours as needed for muscle spasms.     triamcinolone  cream (KENALOG ) 0.1 % Apply 1 Application topically 2 (two) times daily. (Patient taking differently: Apply 1 Application topically as needed.) 453.6 g 3  zolpidem  (AMBIEN ) 5 MG tablet Take 1 tablet (5 mg total) by mouth at bedtime as needed for sleep. 90 tablet 1   No current facility-administered medications for this visit.     Known medication allergies: Allergies  Allergen Reactions   Trazodone  And Nefazodone Other (See Comments)    Nausea, fatique and sweating   Codeine Itching    *Can take Hydrocodone *   Protriptyline Palpitations   Tramadol Other (See Comments)    headache   Vivactil [Protriptyline Hcl] Palpitations     Physical examination: Blood pressure 120/78, pulse 83,  temperature 98.2 F (36.8 C), SpO2 96%.  General: Alert, interactive, in no acute distress. HEENT: PERRLA, TMs pearly gray, turbinates non-edematous without discharge, post-pharynx non erythematous. Neck: Supple without lymphadenopathy. Lungs: Clear to auscultation without wheezing, rhonchi or rales. {no increased work of breathing. CV: Normal S1, S2 without murmurs. Abdomen: Nondistended, nontender. Skin: Warm and dry, without lesions or rashes. Extremities:  No clubbing, cyanosis or edema. Neuro:   Grossly intact.  Diagnostics/Labs: Allergen immunotherapy injections given today  Assessment and plan:  Allergic rhinitis with conjunctivitis with postnasal drip - doing well on allergen immunotherapy!  Getting close to the end and likely will be able to complete your current vial and stop at that point.  If you are not needing daily allergy medications over next couple months then you have had a successful allergy shot course.  Continue allergen immunotherapy per protocol and have access to your epinephrine  device.   - Continue avoidance measures for grass pollen, weed pollen, tree pollen, mold, dust mite - Check which daily medication you have been taking - Singulair  or Xyzal .   If Xyzal  you can stop and use as needed. If Singulair  you can stop but if symptoms increase then resume daily dosing as this is not an as needed medication.  - Can use nasal Atrovent  2 sprays each nostril 3-4 times a day for max dosing for nasal drainage control -for watery eyes can use over-the-counter Pataday 1 drop each eye daily as needed   Follow-up in 6-12 months or sooner if needed  I appreciate the opportunity to take part in Heli's care. Please do not hesitate to contact me with questions.  Sincerely,   Danita Brain, MD Allergy/Immunology [facility]

## 2023-10-24 NOTE — Patient Instructions (Addendum)
 Allergic rhinitis with conjunctivitis with postnasal drip - doing well on allergen immunotherapy!  Getting close to the end and likely will be able to complete your current vial and stop at that point.  If you are not needing daily allergy medications over next couple months then you have had a successful allergy shot course.  Continue allergen immunotherapy per protocol and have access to your epinephrine  device.   - Continue avoidance measures for grass pollen, weed pollen, tree pollen, mold, dust mite - Check which daily medication you have been taking - Singulair  or Xyzal .   If Xyzal  you can stop and use as needed. If Singulair  you can stop but if symptoms increase then resume daily dosing as this is not an as needed medication.  - Can use nasal Atrovent  2 sprays each nostril 3-4 times a day for max dosing for nasal drainage control -for watery eyes can use over-the-counter Pataday 1 drop each eye daily as needed   Follow-up in 6-12 months or sooner if needed

## 2023-10-28 ENCOUNTER — Other Ambulatory Visit: Payer: Self-pay | Admitting: *Deleted

## 2023-10-28 MED ORDER — EPINEPHRINE 0.3 MG/0.3ML IJ SOAJ: 0.3000 mg | INTRAMUSCULAR | 1 refills | Status: DC | PRN

## 2023-10-30 ENCOUNTER — Encounter: Payer: Self-pay | Admitting: Obstetrics and Gynecology

## 2023-10-30 ENCOUNTER — Ambulatory Visit (INDEPENDENT_AMBULATORY_CARE_PROVIDER_SITE_OTHER): Admitting: Obstetrics and Gynecology

## 2023-10-30 ENCOUNTER — Other Ambulatory Visit: Payer: Self-pay | Admitting: Obstetrics and Gynecology

## 2023-10-30 VITALS — BP 128/60 | HR 61 | Ht 61.02 in | Wt 158.6 lb

## 2023-10-30 DIAGNOSIS — M810 Age-related osteoporosis without current pathological fracture: Secondary | ICD-10-CM

## 2023-10-30 DIAGNOSIS — N952 Postmenopausal atrophic vaginitis: Secondary | ICD-10-CM | POA: Diagnosis not present

## 2023-10-30 DIAGNOSIS — N816 Rectocele: Secondary | ICD-10-CM | POA: Diagnosis not present

## 2023-10-30 DIAGNOSIS — Z01419 Encounter for gynecological examination (general) (routine) without abnormal findings: Secondary | ICD-10-CM

## 2023-10-30 MED ORDER — JUBBONTI 60 MG/ML ~~LOC~~ SOSY
60.0000 mL | PREFILLED_SYRINGE | SUBCUTANEOUS | 6 refills | Status: DC
Start: 2023-10-30 — End: 2023-11-07

## 2023-10-30 MED ORDER — EVENITY 105 MG/1.17ML ~~LOC~~ SOSY: 210.0000 mg | PREFILLED_SYRINGE | Freq: Once | SUBCUTANEOUS | 0 refills | Status: AC

## 2023-10-30 NOTE — Progress Notes (Signed)
 73 y.o. y.o. female here for new medicare gyn annual exam. No LMP recorded. Patient has had a hysterectomy.   Has two grandchildren 38 y/o and 40  Dxa worsening osteoporosis fosamax  is not helping. Was at -2.5 now at 3.2 while one it for 3 years.  Needs bone builder.  No HRT use or vaginal estrogen cream use. Mammo-08/15/23 Dexa-07/11/22 worsening osteoporosis on fosamax  and at end of 3 years on it.  Needs a bone builder. Trying for [facility] at [facility] or [facility].  Counseled on risk for atypical femur fracture on prolonged fosamax  use and discussed she has declined into a severe osteoporosis range on the fosamamx. R/b/a/I of medications discussed.  Counseled on daily calcium  and vit D Colonoscopy-09/24/23 Hysterectomy in 1988 for prolapase and ovaries removed in 1995 Has splinting and persistent rectocele: referral to urogyn  Denies any abnormal pap smear history. No longer indicated testing. There is no height or weight on file to calculate BMI.    There were no vitals taken for this visit.  No results found for: DIAGPAP, HPVHIGH, ADEQPAP  GYN HISTORY: No results found for: DIAGPAP, HPVHIGH, ADEQPAP  OB History  No obstetric history on file.    Past Medical History:  Diagnosis Date   Acid reflux    Allergic rhinitis, cause unspecified    Allergy    Anemia    years ago    Complication of anesthesia SLOW TO WAKE   Cough    DJD (degenerative joint disease)    spine   Frequency of urination    H/O hiatal hernia    small   Head cold    Headache(784.0)    migraqine- uses essential oils   HYPERLIPIDEMIA, MIXED 12/03/2006   PT STATES NOT TAKING MED AS PRESCRIBED   MIGRAINES, HX OF 12/03/2006   Neuromuscular disorder (HCC)    Nocturia    OSA on CPAP    Osteopenia    Osteoporosis    PONV (postoperative nausea and vomiting)    in past   Shortness of breath    Sleep apnea    cpap   SUI (stress urinary incontinence, female)    Urgency of urination    VERTIGO,  HX OF 12/03/2006    Past Surgical History:  Procedure Laterality Date   COLONOSCOPY     PUBOVAGINAL SLING  04/30/2011   Procedure: CARLOYN GLADE;  Surgeon: Alm GORMAN Fragmin, MD;  Location: [facility];  Service: Urology;  Laterality: N/A;  sub urethral sling    possible ower   RIGID BRONCHOSCOPY N/A 04/23/2013   Procedure: LASER BRONCHOSCOPY;  Surgeon: Elspeth JAYSON Millers, MD;  Location: [facility] OR;  Service: Thoracic;  Laterality: N/A;   SALPINGOOPHORECTOMY  1995   BILATERAL   VAGINAL HYSTERECTOMY  1988   VIDEO BRONCHOSCOPY Bilateral 03/26/2013   Procedure: VIDEO BRONCHOSCOPY WITHOUT FLUORO;  Surgeon: Ozell KATHEE America, MD;  Location: [facility];  Service: Cardiopulmonary;  Laterality: Bilateral;   VIDEO BRONCHOSCOPY N/A 04/23/2013   Procedure: VIDEO BRONCHOSCOPY;  Surgeon: Elspeth JAYSON Millers, MD;  Location: [facility] OR;  Service: Thoracic;  Laterality: N/A;   VIDEO BRONCHOSCOPY N/A 05/28/2014   Procedure: VIDEO BRONCHOSCOPY;  Surgeon: Elspeth JAYSON Millers, MD;  Location: [facility] OR;  Service: Thoracic;  Laterality: N/A;    Current Outpatient Medications on File Prior to Visit  Medication Sig Dispense Refill   AMBULATORY NON FORMULARY MEDICATION Please provide cpap supplies: heated tubing, humidifier chamber, disposable filters, Mask/Mask frame/Head gear per patient preference 1 Units 0  SUMAtriptan  (IMITREX ) 50 MG tablet TAKE 1 TABLET BY MOUTH AS NEEDED FOR MIGRAINE, MAY REPEAT IN 2  HOURS IF HEADACHE PERSISTS OR  RECURS. MANUFACTURER RECOMMENDS  NOT EXCEEDING 200MG /DAY (Patient taking differently: as needed.) 40 tablet 0   alendronate  (FOSAMAX ) 70 MG tablet Take 1 tablet (70 mg total) by mouth every 7 (seven) days. Take with a full glass of water  on an empty stomach. 12 tablet 3   diclofenac  (VOLTAREN ) 75 MG EC tablet Take 1 tablet (75 mg total) by mouth 2 (two) times daily. 60 tablet 3   EPINEPHrine  0.3 mg/0.3 mL IJ SOAJ injection Inject 0.3 mg into the muscle as needed for  anaphylaxis. 2 each 1   furosemide  (LASIX ) 20 MG tablet Take 1 tablet (20 mg total) by mouth daily. 100 tablet 2   ipratropium (ATROVENT ) 0.03 % nasal spray Place 2 sprays into both nostrils daily. 30 mL 5   levocetirizine (XYZAL ) 5 MG tablet TAKE 1 TABLET BY MOUTH IN THE  EVENING 90 tablet 2   montelukast  (SINGULAIR ) 10 MG tablet Take 1 tablet (10 mg total) by mouth at bedtime. 100 tablet 2   Multiple Vitamin (MULTIVITAMIN) tablet Take 1 tablet by mouth daily.     pantoprazole  (PROTONIX ) 40 MG tablet Take 1 tablet (40 mg total) by mouth daily. 100 tablet 2   rosuvastatin  (CRESTOR ) 20 MG tablet Take 1 tablet (20 mg total) by mouth daily. 100 tablet 0   Semaglutide ,0.25 or 0.5MG /DOS, (OZEMPIC , 0.25 OR 0.5 MG/DOSE,) 2 MG/3ML SOPN Inject 0.5 mg into the skin once a week. 3 mL 3   tiZANidine  (ZANAFLEX ) 4 MG tablet Take 4 mg by mouth every 6 (six) hours as needed for muscle spasms.     triamcinolone  cream (KENALOG ) 0.1 % Apply 1 Application topically 2 (two) times daily. (Patient taking differently: Apply 1 Application topically as needed.) 453.6 g 3   zolpidem  (AMBIEN ) 5 MG tablet Take 1 tablet (5 mg total) by mouth at bedtime as needed for sleep. 90 tablet 1   No current facility-administered medications on file prior to visit.    Social History   Socioeconomic History   Marital status: Married    Spouse name: Wadie   Number of children: 1   Years of education: 12   Highest education level: 12th grade  Occupational History   Occupation: Retired    Comment: Psychologist, prison and probation services  Tobacco Use   Smoking status: Never   Smokeless tobacco: Never  Vaping Use   Vaping status: Never Used  Substance and Sexual Activity   Alcohol use: No   Drug use: No   Sexual activity: Not Currently    Partners: Male    Birth control/protection: Surgical  Other Topics Concern   Not on file  Social History Narrative   Lives with her husband. She has one child. She enjoys reading.   Social Drivers of  Corporate investment banker Strain: Low Risk  (07/21/2023)   Overall Financial Resource Strain (CARDIA)    Difficulty of Paying Living Expenses: Not hard at all  Food Insecurity: No Food Insecurity (07/21/2023)   Hunger Vital Sign    Worried About Running Out of Food in the Last Year: Never true    Ran Out of Food in the Last Year: Never true  Transportation Needs: No Transportation Needs (07/21/2023)   PRAPARE - Administrator, Civil Service (Medical): No    Lack of Transportation (Non-Medical): No  Physical Activity: Insufficiently Active (07/21/2023)  Exercise Vital Sign    Days of Exercise per Week: 2 days    Minutes of Exercise per Session: 40 min  Stress: No Stress Concern Present (06/27/2023)   [employer] of Occupational Health - Occupational Stress Questionnaire    Feeling of Stress : Not at all  Social Connections: Socially Integrated (07/21/2023)   Social Connection and Isolation Panel    Frequency of Communication with Friends and Family: Three times a week    Frequency of Social Gatherings with Friends and Family: Once a week    Attends Religious Services: More than 4 times per year    Active Member of [employer] or Organizations: Yes    Attends Engineer, structural: More than 4 times per year    Marital Status: Married  Recent Concern: Social Connections - Moderately Isolated (06/27/2023)   Social Connection and Isolation Panel    Frequency of Communication with Friends and Family: More than three times a week    Frequency of Social Gatherings with Friends and Family: Once a week    Attends Religious Services: Never    Database administrator or Organizations: No    Attends Banker Meetings: Never    Marital Status: Married  Catering manager Violence: Not At Risk (06/27/2023)   Humiliation, Afraid, Rape, and Kick questionnaire    Fear of Current or Ex-Partner: No    Emotionally Abused: No    Physically Abused: No    Sexually Abused: No     Family History  Problem Relation Age of Onset   Diabetes Mother    Colon cancer Mother    Diabetes Father    Hyperlipidemia Father    Alzheimer's disease Father    Breast cancer Daughter 5       stage III   Allergies Daughter    Breast cancer Maternal Grandmother 64   Colon polyps Brother    Esophageal cancer Neg Hx    Rectal cancer Neg Hx    Stomach cancer Neg Hx      Allergies  Allergen Reactions   Trazodone  And Nefazodone Other (See Comments)    Nausea, fatique and sweating   Codeine Itching    *Can take Hydrocodone *   Protriptyline Palpitations   Tramadol Other (See Comments)    headache   Vivactil [Protriptyline Hcl] Palpitations      Patient's last menstrual period was No LMP recorded. Patient has had a hysterectomy..            Review of Systems Alls systems reviewed and are negative.     Physical Exam Constitutional:      Appearance: Normal appearance.  Genitourinary:     Vulva normal.     No lesions in the vagina.     Right Labia: No rash, lesions or skin changes.    Left Labia: No lesions, skin changes or rash.    Vaginal cuff intact.    No vaginal discharge or tenderness.     Posterior vaginal prolapse present.    Moderate vaginal atrophy present.     Right Adnexa: not absent.    Left Adnexa: not absent.    Cervix is not absent.     Uterus is not absent. Breasts:    Right: Normal.     Left: Normal.  HENT:     Head: Normocephalic.  Neck:     Thyroid : No thyroid  mass, thyromegaly or thyroid  tenderness.  Cardiovascular:     Rate and Rhythm: Normal rate and regular rhythm.  Heart sounds: Normal heart sounds, S1 normal and S2 normal.  Pulmonary:     Effort: Pulmonary effort is normal.     Breath sounds: Normal breath sounds and air entry.  Abdominal:     General: Bowel sounds are normal. There is no distension.     Palpations: Abdomen is soft. There is no mass.     Tenderness: There is no abdominal tenderness. There is no  guarding or rebound.  Musculoskeletal:     Cervical back: Full passive range of motion without pain, normal range of motion and neck supple. No tenderness.     Right lower leg: No edema.     Left lower leg: No edema.  Neurological:     Mental Status: She is alert.  Skin:    General: Skin is warm.  Psychiatric:        Mood and Affect: Mood normal.        Behavior: Behavior normal.        Thought Content: Thought content normal.  Vitals and nursing note reviewed. Exam conducted with a chaperone present.       A:         Well Woman GYN exam Severe osteoporosis, failed fosamax  TAH/BSO rectocele                             P:        Pap smear not indicated Encouraged annual mammogram screening Colon cancer screening up-to-date DXA up-to-date Labs and immunizations to do with PMD Discussed breast self exams Encouraged healthy lifestyle practices Encouraged Vit D and Calcium   Bone support options counseled r/b/a/I of each and discussed need for bone builder at this point and risk for fracture with rapid decline in her bone density on fosamax . To see what coverage we have with her insurance. Damien notified. To send through [place]. Lab17 and vit D today. Counseled on risk for lower calcium  and vit D levels on the medication, if severely low to begin with, and there is need to take while on the medications.  Continue fosamax  until Damien hears of plan. No follow-ups on file.  Almarie MARLA Carpen

## 2023-10-31 ENCOUNTER — Encounter: Payer: Self-pay | Admitting: Allergy

## 2023-10-31 ENCOUNTER — Encounter: Payer: Self-pay | Admitting: Obstetrics and Gynecology

## 2023-10-31 ENCOUNTER — Ambulatory Visit: Payer: Self-pay | Admitting: Obstetrics and Gynecology

## 2023-10-31 LAB — COMPREHENSIVE METABOLIC PANEL WITH GFR
AG Ratio: 1.8 (calc) (ref 1.0–2.5)
ALT: 14 U/L (ref 6–29)
AST: 24 U/L (ref 10–35)
Albumin: 4.6 g/dL (ref 3.6–5.1)
Alkaline phosphatase (APISO): 72 U/L (ref 37–153)
BUN: 15 mg/dL (ref 7–25)
CO2: 26 mmol/L (ref 20–32)
Calcium: 9.7 mg/dL (ref 8.6–10.4)
Chloride: 105 mmol/L (ref 98–110)
Creat: 0.76 mg/dL (ref 0.60–1.00)
Globulin: 2.5 g/dL (ref 1.9–3.7)
Glucose, Bld: 95 mg/dL (ref 65–99)
Potassium: 3.5 mmol/L (ref 3.5–5.3)
Sodium: 140 mmol/L (ref 135–146)
Total Bilirubin: 0.4 mg/dL (ref 0.2–1.2)
Total Protein: 7.1 g/dL (ref 6.1–8.1)
eGFR: 82 mL/min/1.73m2 (ref >=60)

## 2023-10-31 LAB — PTH, INTACT AND CALCIUM
Calcium: 9.6 mg/dL (ref 8.6–10.4)
PTH: 38 pg/mL (ref 16–77)

## 2023-10-31 LAB — VITAMIN D 25 HYDROXY (VIT D DEFICIENCY, FRACTURES): Vit D, 25-Hydroxy: 37 ng/mL (ref 30–100)

## 2023-11-01 ENCOUNTER — Telehealth: Payer: Self-pay

## 2023-11-01 NOTE — Telephone Encounter (Signed)
 [place] communication from [facility] call number [phone]. Fax [phone] patient request generic epineprine auto inject 0.3mg  vs name brand due to availability is this ok to send in?  Please advise

## 2023-11-06 ENCOUNTER — Telehealth: Payer: Self-pay | Admitting: *Deleted

## 2023-11-06 DIAGNOSIS — M810 Age-related osteoporosis without current pathological fracture: Secondary | ICD-10-CM

## 2023-11-06 MED ORDER — EPINEPHRINE 0.3 MG/0.3ML IJ SOAJ: 0.3000 mg | INTRAMUSCULAR | 1 refills | Status: AC | PRN

## 2023-11-06 NOTE — Telephone Encounter (Signed)
 Epipen  has been sent to [facility].

## 2023-11-06 NOTE — Telephone Encounter (Signed)
 Message left to return call to [place] at [phone].   Need to advise patient of Reclast plan created and schedule lab appointment.   Future orders placed.

## 2023-11-06 NOTE — Addendum Note (Signed)
 Addended by: ONEITA CHRISTIANS D on: 11/06/2023 09:20 AM   Modules accepted: Orders

## 2023-11-07 ENCOUNTER — Encounter: Payer: Self-pay | Admitting: Family Medicine

## 2023-11-07 ENCOUNTER — Ambulatory Visit: Admitting: Family Medicine

## 2023-11-07 ENCOUNTER — Other Ambulatory Visit (HOSPITAL_COMMUNITY): Payer: Self-pay | Admitting: Obstetrics and Gynecology

## 2023-11-07 VITALS — BP 121/63 | HR 57 | Ht 61.02 in | Wt 159.0 lb

## 2023-11-07 DIAGNOSIS — M810 Age-related osteoporosis without current pathological fracture: Secondary | ICD-10-CM

## 2023-11-07 DIAGNOSIS — R7303 Prediabetes: Secondary | ICD-10-CM

## 2023-11-07 DIAGNOSIS — Z23 Encounter for immunization: Secondary | ICD-10-CM | POA: Diagnosis not present

## 2023-11-07 DIAGNOSIS — E782 Mixed hyperlipidemia: Secondary | ICD-10-CM | POA: Diagnosis not present

## 2023-11-07 DIAGNOSIS — F5101 Primary insomnia: Secondary | ICD-10-CM | POA: Diagnosis not present

## 2023-11-07 NOTE — Progress Notes (Signed)
 Christina Hayes - 73 y.o. female MRN [id number]  Date of birth: 03-30-1950  Subjective Chief Complaint  Patient presents with   Osteoporosis   Insomnia    HPI Christina Hayes is a 73 y.o. female here today for follow up visit.   She reports that she is doing pretty well.   She has done very well with Ozempic  and is currently receiving through patient assistance.  She is aware that patient assistance will be ending at the end of the year.  Her blood sugars have been well-controlled.  Tolerating crestor  for associated HLD.   She was recommended to start denosumab injections by her GYN due to osteoporosis.  She reports that the cost of this is way too much.  Insomnia is stable with ambien  at current strength.    ROS:  A comprehensive ROS was completed and negative except as noted per HPI   Allergies  Allergen Reactions   Trazodone  And Nefazodone Other (See Comments)    Nausea, fatique and sweating   Codeine Itching    *Can take Hydrocodone *   Protriptyline Palpitations   Tramadol Other (See Comments)    headache   Vivactil [Protriptyline Hcl] Palpitations    Past Medical History:  Diagnosis Date   Acid reflux    Allergic rhinitis, cause unspecified    Allergy    Anemia    years ago    Complication of anesthesia SLOW TO WAKE   Cough    DJD (degenerative joint disease)    spine   Frequency of urination    H/O hiatal hernia    small   Head cold    Headache(784.0)    migraqine- uses essential oils   HYPERLIPIDEMIA, MIXED 12/03/2006   PT STATES NOT TAKING MED AS PRESCRIBED   MIGRAINES, HX OF 12/03/2006   Neuromuscular disorder (HCC)    Nocturia    OSA on CPAP    Osteoporosis    -2.5 to -3.2 while on fosamax  for 3 years. needs bone builder. seeing what insurance coverage is. No fractures.   PONV (postoperative nausea and vomiting)    in past   Shortness of breath    Sleep apnea    cpap   SUI (stress urinary incontinence, female)    Urgency of urination     VERTIGO, HX OF 12/03/2006    Past Surgical History:  Procedure Laterality Date   COLONOSCOPY     PUBOVAGINAL SLING  04/30/2011   Procedure: CARLOYN GLADE;  Surgeon: Alm GORMAN Fragmin, MD;  Location[facility];  Service: Urology;  Laterality: N/A;  sub urethral sling    possible ower   RIGID BRONCHOSCOPY N/A 04/23/2013   Procedure: LASER BRONCHOSCOPY;  Surgeon: Elspeth JAYSON Millers, MD;  Location: [facility] OR;  Service: Thoracic;  Laterality: N/A;   SALPINGOOPHORECTOMY  1995   BILATERAL   VAGINAL HYSTERECTOMY  1988   VIDEO BRONCHOSCOPY Bilateral 03/26/2013   Procedure: VIDEO BRONCHOSCOPY WITHOUT FLUORO;  Surgeon: Ozell KATHEE America, MD;  Location: [facility];  Service: Cardiopulmonary;  Laterality: Bilateral;   VIDEO BRONCHOSCOPY N/A 04/23/2013   Procedure: VIDEO BRONCHOSCOPY;  Surgeon: Elspeth JAYSON Millers, MD;  Location: [facility] OR;  Service: Thoracic;  Laterality: N/A;   VIDEO BRONCHOSCOPY N/A 05/28/2014   Procedure: VIDEO BRONCHOSCOPY;  Surgeon: Elspeth JAYSON Millers, MD;  Location: [facility] OR;  Service: Thoracic;  Laterality: N/A;    Social History   Socioeconomic History   Marital status: Married    Spouse name: Wadie   Number of  children: 1   Years of education: 12   Highest education level: 12th grade  Occupational History   Occupation: Retired    Comment: Psychologist, prison and probation services  Tobacco Use   Smoking status: Never   Smokeless tobacco: Never  Vaping Use   Vaping status: Never Used  Substance and Sexual Activity   Alcohol use: No   Drug use: No   Sexual activity: Not Currently    Partners: Male    Birth control/protection: Surgical  Other Topics Concern   Not on file  Social History Narrative   Lives with her husband. She has one child. She enjoys reading.   Social Drivers of Corporate investment banker Strain: Low Risk  (11/06/2023)   Overall Financial Resource Strain (CARDIA)    Difficulty of Paying Living Expenses: Not very hard  Food Insecurity: No Food  Insecurity (11/06/2023)   Hunger Vital Sign    Worried About Running Out of Food in the Last Year: Never true    Ran Out of Food in the Last Year: Never true  Transportation Needs: No Transportation Needs (11/06/2023)   PRAPARE - Administrator, Civil Service (Medical): No    Lack of Transportation (Non-Medical): No  Physical Activity: Inactive (11/06/2023)   Exercise Vital Sign    Days of Exercise per Week: 0 days    Minutes of Exercise per Session: Not on file  Stress: Stress Concern Present (11/06/2023)   [employer] of Occupational Health - Occupational Stress Questionnaire    Feeling of Stress: To some extent  Social Connections: Unknown (11/06/2023)   Social Connection and Isolation Panel    Frequency of Communication with Friends and Family: Twice a week    Frequency of Social Gatherings with Friends and Family: Patient declined    Attends Religious Services: 1 to 4 times per year    Active Member of [employer] or Organizations: No    Attends Engineer, structural: Not on file    Marital Status: Married    Family History  Problem Relation Age of Onset   Diabetes Mother    Colon cancer Mother    Diabetes Father    Hyperlipidemia Father    Alzheimer's disease Father    Breast cancer Daughter 91       stage III   Allergies Daughter    Breast cancer Maternal Grandmother 45   Colon polyps Brother    Esophageal cancer Neg Hx    Rectal cancer Neg Hx    Stomach cancer Neg Hx     Health Maintenance  Topic Date Due   COVID-19 Vaccine (8 - 2025-26 season) 11/22/2024 (Originally 10/25/2023)   DTaP/Tdap/Td (3 - Td or Tdap) 01/27/2024   Medicare Annual Wellness (AWV)  06/26/2024   Mammogram  08/11/2025   Colonoscopy  09/23/2028   Pneumococcal Vaccine: 50+ Years  Completed   Influenza Vaccine  Completed   DEXA SCAN  Completed   Hepatitis C Screening  Completed   Zoster Vaccines- Shingrix  Completed   Meningococcal B Vaccine  Aged Out      ----------------------------------------------------------------------------------------------------------------------------------------------------------------------------------------------------------------- Physical Exam BP 121/63 (BP Location: Left Arm, Patient Position: Sitting, Cuff Size: Normal)   Pulse (!) 57   Ht 5' 1.02 (1.55 m)   Wt 159 lb (72.1 kg)   SpO2 99%   BMI 30.02 kg/m   Physical Exam Constitutional:      Appearance: Normal appearance.  Eyes:     General: No scleral icterus. Cardiovascular:  Rate and Rhythm: Normal rate and regular rhythm.  Pulmonary:     Effort: Pulmonary effort is normal.     Breath sounds: Normal breath sounds.  Musculoskeletal:     Cervical back: Neck supple.  Neurological:     Mental Status: She is alert.  Psychiatric:        Mood and Affect: Mood normal.        Behavior: Behavior normal.     ------------------------------------------------------------------------------------------------------------------------------------------------------------------------------------------------------------------- Assessment and Plan  Prediabetes She has done quite well with Ozempic .  She is receiving this through patient assistance however this is being discontinued at the end of the year.  Will have her continue for now and look for alternatives f towards the beginning of the year.  Will see if we can get 1 mg pen for her so she can get a few additional doses of this.  Age-related osteoporosis without current pathological fracture She has been on Fosamax .  Her GYN recently recommended she start Prolia but she reports that this is cost prohibitive.  Reclast see last given as another option and she will discuss further with her GYN.  Insomnia Doing pretty well with ambien  at current strength.  Will plan to continue.    No orders of the defined types were placed in this encounter.   No follow-ups on file.

## 2023-11-07 NOTE — Assessment & Plan Note (Signed)
 Doing pretty well with ambien at current strength.  Will plan to continue.

## 2023-11-07 NOTE — Assessment & Plan Note (Signed)
 She has done quite well with Ozempic .  She is receiving this through patient assistance however this is being discontinued at the end of the year.  Will have her continue for now and look for alternatives f towards the beginning of the year.  Will see if we can get 1 mg pen for her so she can get a few additional doses of this.

## 2023-11-07 NOTE — Assessment & Plan Note (Signed)
 She has been on Fosamax .  Her GYN recently recommended she start Prolia but she reports that this is cost prohibitive.  Reclast see last given as another option and she will discuss further with her GYN.

## 2023-11-08 ENCOUNTER — Telehealth (HOSPITAL_COMMUNITY): Payer: Self-pay

## 2023-11-08 NOTE — Telephone Encounter (Signed)
 Auth Submission: APPROVED Site of care: Site of care: [facility] Payer: [employer] Medicare Medication & CPT/J Code(s) submitted: Reclast (Zolendronic acid) [id number] Diagnosis Code: M81.0 Route of submission (phone, fax, portal): portal Phone # Fax # Auth type: Buy/Bill HB Units/visits requested: 5mg  x 1 dose Reference number: [id number] Approval from: 11/08/23 to 11/07/24

## 2023-11-11 ENCOUNTER — Telehealth: Payer: Self-pay

## 2023-11-11 NOTE — Telephone Encounter (Signed)
 Completed Novo refill/ change dose form and faxed to provider for Review, signature.

## 2023-11-20 ENCOUNTER — Telehealth: Payer: Self-pay

## 2023-11-20 NOTE — Telephone Encounter (Signed)
 Left message to call Kate, RN at [place], [phone], option 5.   Called to review Reclast Infusion benefits review and scheduling.

## 2023-11-20 NOTE — Telephone Encounter (Signed)
 Received Ozempic   1mg  - received one box ( one pen-31ml)   Dr. Alvia- Last in chart - current med list shows  as 0.5 weekly - should this be changed to 1mg  ?  Christina Hayes-  was the patient suppose to only receive one pen from patient assistance for the Ozempic  1mg    Patient not yet contacted regarding receipt of Ozempic 

## 2023-11-21 ENCOUNTER — Ambulatory Visit (INDEPENDENT_AMBULATORY_CARE_PROVIDER_SITE_OTHER)

## 2023-11-21 DIAGNOSIS — J309 Allergic rhinitis, unspecified: Secondary | ICD-10-CM

## 2023-11-21 NOTE — Telephone Encounter (Signed)
 So what will I tell her is the plan for the remainder of the year for refills, if you know ?  Will it start over on January the 1, 2026 ?

## 2023-11-21 NOTE — Telephone Encounter (Signed)
 Called patient and left a detailed voicemail message on listed home #  allowed on DPR.  Also forwarding this message to Dr. Alvia for review as well.

## 2023-11-22 ENCOUNTER — Encounter: Payer: Self-pay | Admitting: Family Medicine

## 2023-11-24 ENCOUNTER — Encounter: Payer: Self-pay | Admitting: Family Medicine

## 2023-11-28 NOTE — Telephone Encounter (Signed)
 Patient informed - and will pick up the one box of Ozempic  today  in office.

## 2023-12-02 ENCOUNTER — Other Ambulatory Visit (HOSPITAL_COMMUNITY): Payer: Self-pay

## 2023-12-02 NOTE — Telephone Encounter (Signed)
 Left message to call Noreene Larsson, RN at [place], [phone], option 5.

## 2023-12-07 NOTE — Progress Notes (Unsigned)
   12/09/2023  Patient ID: Christina Hayes, female   DOB: 05-Nov-1950, 73 y.o.   MRN: [id number]  Subjective/Objective: Telephone follow-up visit to discuss management of chronic conditions  Pre-diabetes -Current medications:  Ozempic  0.5mg  weekly  -Ozempic  was recently decreased to 0.5mg  weekly due to stomach upset (n/v/d) with 1mg  dose -Patient is tolerating Ozempic  at 0.5mg  weekly well; will still use loperamide if needed for diarrhea, but this has occurred much less frequently -A1c in April very well controlled at 5.5% -Patient receives Ozempic  through Novo PAP currently but will no longer be eligible after 01/22/2024.  She currently has some 0.5mg  pens remaining along with 3 boxes of 1mg  pens (will use 36 clicks to administer 0.5mg  dose with these).  She has at least 6 months of medication remaining based on this supply  Osteoporosis -Current medications:  alendronate  70mg  weekly -Last DEXA scan was June 2024, which showed continued decrease in BMD with a T score of -3.2 -Recently recommended that patient start bone building medication to replace alendronate  and orders were placed to check coverage/affordability  Hyperlipidemia -Current medications:  rosuvastatin  20mg  daily -Last lipid panel was 08/07/2022:  LDL and TG 82  Medication Access/Adherence -Patient was seen by Dr. Alvia (PCP) but states refills for medications were not sent in: states she will need refills on diclofenac  75mg  BID (previously prescribed by Dr. ONEIDA), furosemide  20mg , montelukast  10mg , pantoprazole  40mg , rosuvastatin  20mg , sumatriptan  50mg  and zolpidem  5mg  to get through till her next follow-up in February   Assessment/Plan  Pre-diabetes -Continue current regimen at this time (has enough Ozempic  to last until around late May/early June of 2026) -I recommend A1c at next PCP visit -Patient will not qualify for any GLP1 medications approved for diabetes, but I can look into coverage for Zepbound based on OSA  diagnosis- I will check on this with insurance after the 1st of the year  Osteoporosis -Opportunities for improvement -Coordinating with GYN provider that recently ordered Reclast to check on coverage/cost  Hyperlipidemia -Currently controlled -Continue current regimen -Due for lipid panel at next PCP visit  Medication Access/Adherence -Refill orders pending for diclofenac  75mg  BID (previously prescribed by Dr. ONEIDA), furosemide  20mg , montelukast  10mg , pantoprazole  40mg , rosuvastatin  20mg , sumatriptan  50mg  and zolpidem  5mg  to get through till her next follow-up in February   Follow-up:  Will follow-up with patient in regard to osteoporosis plan and medication refills   Christina Hayes, PharmD, DPLA

## 2023-12-09 ENCOUNTER — Other Ambulatory Visit: Payer: Self-pay

## 2023-12-09 DIAGNOSIS — R7303 Prediabetes: Secondary | ICD-10-CM

## 2023-12-09 DIAGNOSIS — K219 Gastro-esophageal reflux disease without esophagitis: Secondary | ICD-10-CM

## 2023-12-09 DIAGNOSIS — M810 Age-related osteoporosis without current pathological fracture: Secondary | ICD-10-CM

## 2023-12-09 DIAGNOSIS — M7060 Trochanteric bursitis, unspecified hip: Secondary | ICD-10-CM

## 2023-12-09 DIAGNOSIS — E782 Mixed hyperlipidemia: Secondary | ICD-10-CM

## 2023-12-11 MED ORDER — SUMATRIPTAN SUCCINATE 50 MG PO TABS: ORAL_TABLET | ORAL | 0 refills | Status: AC

## 2023-12-11 MED ORDER — PANTOPRAZOLE SODIUM 40 MG PO TBEC
40.0000 mg | DELAYED_RELEASE_TABLET | Freq: Every day | ORAL | 0 refills | Status: AC
Start: 2023-12-11 — End: ?

## 2023-12-11 MED ORDER — ROSUVASTATIN CALCIUM 20 MG PO TABS: 20.0000 mg | ORAL_TABLET | Freq: Every day | ORAL | 0 refills | Status: AC

## 2023-12-11 MED ORDER — FUROSEMIDE 20 MG PO TABS: 20.0000 mg | ORAL_TABLET | Freq: Every day | ORAL | 0 refills | Status: DC | PRN

## 2023-12-11 MED ORDER — ZOLPIDEM TARTRATE 5 MG PO TABS: 5.0000 mg | ORAL_TABLET | Freq: Every evening | ORAL | 0 refills | Status: AC | PRN

## 2023-12-11 MED ORDER — MONTELUKAST SODIUM 10 MG PO TABS: 10.0000 mg | ORAL_TABLET | Freq: Every day | ORAL | 0 refills | Status: AC

## 2023-12-11 MED ORDER — DICLOFENAC SODIUM 75 MG PO TBEC: 75.0000 mg | DELAYED_RELEASE_TABLET | Freq: Two times a day (BID) | ORAL | 0 refills | Status: DC

## 2023-12-12 NOTE — Telephone Encounter (Signed)
 Good afternoon. Have benefits for Reclast been communicated the patient?

## 2023-12-13 NOTE — Telephone Encounter (Signed)
 Epic message to Infusion Team on 11/20 to confirm if Reclast benefits had been communicated to patient. Awaiting response

## 2023-12-18 ENCOUNTER — Other Ambulatory Visit: Payer: Self-pay | Admitting: Family Medicine

## 2023-12-26 ENCOUNTER — Encounter: Payer: Self-pay | Admitting: Allergy

## 2023-12-26 ENCOUNTER — Ambulatory Visit

## 2023-12-26 DIAGNOSIS — J309 Allergic rhinitis, unspecified: Secondary | ICD-10-CM

## 2023-12-26 DIAGNOSIS — J302 Other seasonal allergic rhinitis: Secondary | ICD-10-CM

## 2023-12-26 NOTE — Telephone Encounter (Signed)
 Have benefits been reviewed with this patient for Reclast infusion?   CC Kate Africa, RN

## 2023-12-26 NOTE — Telephone Encounter (Signed)
 Dr. Glennon, please advise. 3 attempts have been made by pharmacy staff to reach out to patient to discuss benefits for Reclast. Patient has not returned call. See referral.   Routing to provider for review.

## 2023-12-31 ENCOUNTER — Encounter: Payer: Self-pay | Admitting: *Deleted

## 2024-01-01 ENCOUNTER — Other Ambulatory Visit: Payer: Self-pay | Admitting: Obstetrics and Gynecology

## 2024-01-01 DIAGNOSIS — M810 Age-related osteoporosis without current pathological fracture: Secondary | ICD-10-CM

## 2024-01-01 NOTE — Telephone Encounter (Signed)
 Routing FYI. Patient was contacted x2, no return call.  See open encounter dated 12/31/23.  Will close this encounter.   Cc: Damien

## 2024-01-03 NOTE — Telephone Encounter (Signed)
 Routing to Dr. Glennon as RICK and will close encounter.

## 2024-01-22 ENCOUNTER — Ambulatory Visit (INDEPENDENT_AMBULATORY_CARE_PROVIDER_SITE_OTHER)

## 2024-01-22 DIAGNOSIS — J309 Allergic rhinitis, unspecified: Secondary | ICD-10-CM

## 2024-01-22 DIAGNOSIS — J302 Other seasonal allergic rhinitis: Secondary | ICD-10-CM

## 2024-01-28 DIAGNOSIS — J302 Other seasonal allergic rhinitis: Secondary | ICD-10-CM | POA: Diagnosis not present

## 2024-01-28 DIAGNOSIS — J3089 Other allergic rhinitis: Secondary | ICD-10-CM | POA: Diagnosis not present

## 2024-01-28 NOTE — Progress Notes (Deleted)
 VIALS MADE ON 01/28/24

## 2024-01-29 DIAGNOSIS — J301 Allergic rhinitis due to pollen: Secondary | ICD-10-CM | POA: Diagnosis not present

## 2024-01-30 NOTE — Progress Notes (Signed)
 VIALS MADE ON 01/28/24

## 2024-02-07 NOTE — Progress Notes (Signed)
" ° °  02/07/2024  Patient ID: Verneita LITTIE Alto, female   DOB: Mar 08, 1950, 74 y.o.   MRN: [id number]  Patient will no longer receive Ozempic  through Novo PAP based on program changes this year.  I have submitted a PA request to insurance for Zepbound 2.5mg  weekly based on obesity and severe OSA.  I will monitor progress of PA and inform patient and provider of outcome.  Covermymeds Key:  [id number].  Channing DELENA Mealing, PharmD, DPLA  "

## 2024-02-11 ENCOUNTER — Other Ambulatory Visit: Payer: Self-pay | Admitting: Family Medicine

## 2024-02-11 DIAGNOSIS — M7060 Trochanteric bursitis, unspecified hip: Secondary | ICD-10-CM

## 2024-02-18 ENCOUNTER — Other Ambulatory Visit: Payer: Self-pay | Admitting: Family Medicine

## 2024-02-28 ENCOUNTER — Ambulatory Visit (INDEPENDENT_AMBULATORY_CARE_PROVIDER_SITE_OTHER): Admitting: *Deleted

## 2024-02-28 DIAGNOSIS — J302 Other seasonal allergic rhinitis: Secondary | ICD-10-CM

## 2024-02-28 NOTE — Progress Notes (Signed)
" ° °  02/28/2024  Patient ID: Verneita LITTIE Alto, female   DOB: June 26, 1950, 74 y.o.   MRN: [id number]  PA for Zepbound has been approved by patient insurance; however, test claim reflects copay would be >$700 for a 1 month supply.  Unlikely that this would be affordable, and patient will no longer receive Ozempic  through Novo PAP based on program changes.  Patient does not qualify for Trulicity PAP since she does not have a diabetes diagnosis.    Channing DELENA Mealing, PharmD, DPLA   "

## 2024-02-28 NOTE — Progress Notes (Unsigned)
" ° °  02/28/2024 Name: Christina Hayes MRN: [id number] DOB: [birth year]  No chief complaint on file.   {Visit Type:26650}   Subjective:  Care Team: Primary Care Provider: Alvia Bring, DO ; Next Scheduled Visit: *** {careteamprovider:27366}  Medication Access/Adherence  Current Pharmacy:  [facility]) - [place], [place] - [address] [facility] [address] [place] [address] [place] [address] Phone: [phone] Fax: [phone]  [facility] - [place], [place] - [address] [place] [address] Phone: [phone] Fax: [phone]  [id number] [place], [place] - [address] AT [facility] OF [address] & [facility] [address] [place] [address] Phone: [phone] Fax: [phone]   Patient reports affordability concerns with their medications: {YES/NO:21197} Patient reports access/transportation concerns to their pharmacy: {YES/NO:21197} Patient reports adherence concerns with their medications:  {YES/NO:21197} ***   {Pharmacy S/O Choices:26420}   Objective:  Lab Results  Component Value Date   HGBA1C 5.5 (A) 05/08/2023    Lab Results  Component Value Date   CREATININE 0.76 10/30/2023   BUN 15 10/30/2023   NA 140 10/30/2023   K 3.5 10/30/2023   CL 105 10/30/2023   CO2 26 10/30/2023    Lab Results  Component Value Date   CHOL 174 08/07/2022   HDL 77 08/07/2022   LDLCALC 82 08/07/2022   LDLDIRECT 231.2 03/28/2010   TRIG 82 08/07/2022   CHOLHDL 2.5 04/26/2021    Medications Reviewed Today   Medications were not reviewed in this encounter       Assessment/Plan:   {Pharmacy A/P Choices:26421}  Follow Up Plan: ***  ***   "

## 2024-03-09 ENCOUNTER — Ambulatory Visit: Admitting: Family Medicine

## 2024-06-30 ENCOUNTER — Ambulatory Visit
